# Patient Record
Sex: Female | Born: 1975 | Race: Black or African American | Hispanic: No | Marital: Married | Smoking: Never smoker
Health system: Southern US, Community
[De-identification: ages and names within clinical notes are randomized; demographics above are authoritative.]

## PROBLEM LIST (undated history)

## (undated) DIAGNOSIS — F909 Attention-deficit hyperactivity disorder, unspecified type: Secondary | ICD-10-CM

## (undated) DIAGNOSIS — R569 Unspecified convulsions: Secondary | ICD-10-CM

## (undated) DIAGNOSIS — I10 Essential (primary) hypertension: Secondary | ICD-10-CM

## (undated) DIAGNOSIS — D62 Acute posthemorrhagic anemia: Secondary | ICD-10-CM

## (undated) DIAGNOSIS — Z931 Gastrostomy status: Secondary | ICD-10-CM

## (undated) DIAGNOSIS — R4701 Aphasia: Secondary | ICD-10-CM

## (undated) DIAGNOSIS — F32A Depression, unspecified: Secondary | ICD-10-CM

## (undated) DIAGNOSIS — G8193 Hemiplegia, unspecified affecting right nondominant side: Secondary | ICD-10-CM

## (undated) DIAGNOSIS — I69920 Aphasia following unspecified cerebrovascular disease: Secondary | ICD-10-CM

## (undated) DIAGNOSIS — M2392 Unspecified internal derangement of left knee: Secondary | ICD-10-CM

## (undated) DIAGNOSIS — F419 Anxiety disorder, unspecified: Secondary | ICD-10-CM

## (undated) DIAGNOSIS — F329 Major depressive disorder, single episode, unspecified: Secondary | ICD-10-CM

## (undated) DIAGNOSIS — M1990 Unspecified osteoarthritis, unspecified site: Secondary | ICD-10-CM

## (undated) DIAGNOSIS — I639 Cerebral infarction, unspecified: Secondary | ICD-10-CM

## (undated) HISTORY — PX: KNEE SURGERY: SHX244

---

## 1997-04-16 ENCOUNTER — Ambulatory Visit (HOSPITAL_COMMUNITY): Admission: AD | Admit: 1997-04-16 | Discharge: 1997-04-16 | Payer: Self-pay | Admitting: Obstetrics

## 1997-09-24 ENCOUNTER — Emergency Department (HOSPITAL_COMMUNITY): Admission: EM | Admit: 1997-09-24 | Discharge: 1997-09-25 | Payer: Self-pay | Admitting: Emergency Medicine

## 1997-11-04 ENCOUNTER — Emergency Department (HOSPITAL_COMMUNITY): Admission: EM | Admit: 1997-11-04 | Discharge: 1997-11-04 | Payer: Self-pay | Admitting: Emergency Medicine

## 1998-03-11 ENCOUNTER — Inpatient Hospital Stay (HOSPITAL_COMMUNITY): Admission: AD | Admit: 1998-03-11 | Discharge: 1998-03-11 | Payer: Self-pay | Admitting: Obstetrics & Gynecology

## 1998-03-12 ENCOUNTER — Inpatient Hospital Stay (HOSPITAL_COMMUNITY): Admission: AD | Admit: 1998-03-12 | Discharge: 1998-03-12 | Payer: Self-pay | Admitting: *Deleted

## 1999-07-14 ENCOUNTER — Emergency Department (HOSPITAL_COMMUNITY): Admission: EM | Admit: 1999-07-14 | Discharge: 1999-07-14 | Payer: Self-pay | Admitting: Emergency Medicine

## 1999-12-09 ENCOUNTER — Emergency Department (HOSPITAL_COMMUNITY): Admission: EM | Admit: 1999-12-09 | Discharge: 1999-12-09 | Payer: Self-pay

## 2000-04-09 ENCOUNTER — Inpatient Hospital Stay (HOSPITAL_COMMUNITY): Admission: AD | Admit: 2000-04-09 | Discharge: 2000-04-09 | Payer: Self-pay | Admitting: Obstetrics

## 2000-07-12 ENCOUNTER — Inpatient Hospital Stay (HOSPITAL_COMMUNITY): Admission: EM | Admit: 2000-07-12 | Discharge: 2000-07-14 | Payer: Self-pay | Admitting: Emergency Medicine

## 2000-08-22 ENCOUNTER — Inpatient Hospital Stay (HOSPITAL_COMMUNITY): Admission: AD | Admit: 2000-08-22 | Discharge: 2000-08-22 | Payer: Self-pay | Admitting: *Deleted

## 2000-12-22 ENCOUNTER — Emergency Department (HOSPITAL_COMMUNITY): Admission: EM | Admit: 2000-12-22 | Discharge: 2000-12-22 | Payer: Self-pay | Admitting: Emergency Medicine

## 2000-12-27 ENCOUNTER — Emergency Department (HOSPITAL_COMMUNITY): Admission: EM | Admit: 2000-12-27 | Discharge: 2000-12-28 | Payer: Self-pay | Admitting: Emergency Medicine

## 2001-01-19 ENCOUNTER — Emergency Department (HOSPITAL_COMMUNITY): Admission: EM | Admit: 2001-01-19 | Discharge: 2001-01-19 | Payer: Self-pay | Admitting: Emergency Medicine

## 2001-03-14 ENCOUNTER — Encounter: Payer: Self-pay | Admitting: Emergency Medicine

## 2001-03-14 ENCOUNTER — Emergency Department (HOSPITAL_COMMUNITY): Admission: EM | Admit: 2001-03-14 | Discharge: 2001-03-14 | Payer: Self-pay | Admitting: Emergency Medicine

## 2002-03-07 ENCOUNTER — Emergency Department (HOSPITAL_COMMUNITY): Admission: EM | Admit: 2002-03-07 | Discharge: 2002-03-07 | Payer: Self-pay | Admitting: Emergency Medicine

## 2002-07-28 ENCOUNTER — Emergency Department (HOSPITAL_COMMUNITY): Admission: EM | Admit: 2002-07-28 | Discharge: 2002-07-28 | Payer: Self-pay

## 2002-11-12 ENCOUNTER — Emergency Department (HOSPITAL_COMMUNITY): Admission: EM | Admit: 2002-11-12 | Discharge: 2002-11-12 | Payer: Self-pay | Admitting: Emergency Medicine

## 2002-11-29 ENCOUNTER — Inpatient Hospital Stay (HOSPITAL_COMMUNITY): Admission: AD | Admit: 2002-11-29 | Discharge: 2002-11-29 | Payer: Self-pay | Admitting: Obstetrics and Gynecology

## 2004-01-24 ENCOUNTER — Emergency Department (HOSPITAL_COMMUNITY): Admission: EM | Admit: 2004-01-24 | Discharge: 2004-01-24 | Payer: Self-pay | Admitting: Emergency Medicine

## 2004-12-29 ENCOUNTER — Emergency Department (HOSPITAL_COMMUNITY): Admission: EM | Admit: 2004-12-29 | Discharge: 2004-12-29 | Payer: Self-pay | Admitting: Emergency Medicine

## 2008-05-16 ENCOUNTER — Emergency Department (HOSPITAL_COMMUNITY): Admission: EM | Admit: 2008-05-16 | Discharge: 2008-05-16 | Payer: Self-pay | Admitting: Family Medicine

## 2009-05-22 ENCOUNTER — Emergency Department (HOSPITAL_COMMUNITY): Admission: EM | Admit: 2009-05-22 | Discharge: 2009-05-22 | Payer: Self-pay | Admitting: Emergency Medicine

## 2009-09-12 ENCOUNTER — Inpatient Hospital Stay (HOSPITAL_COMMUNITY): Admission: AD | Admit: 2009-09-12 | Discharge: 2009-09-12 | Payer: Self-pay | Admitting: Obstetrics

## 2009-10-28 ENCOUNTER — Inpatient Hospital Stay (HOSPITAL_COMMUNITY): Admission: AD | Admit: 2009-10-28 | Discharge: 2009-10-28 | Payer: Self-pay | Admitting: Obstetrics and Gynecology

## 2009-10-28 DIAGNOSIS — O2390 Unspecified genitourinary tract infection in pregnancy, unspecified trimester: Secondary | ICD-10-CM

## 2009-10-28 DIAGNOSIS — O98819 Other maternal infectious and parasitic diseases complicating pregnancy, unspecified trimester: Secondary | ICD-10-CM

## 2009-10-28 DIAGNOSIS — A5901 Trichomonal vulvovaginitis: Secondary | ICD-10-CM

## 2009-10-28 DIAGNOSIS — N72 Inflammatory disease of cervix uteri: Secondary | ICD-10-CM

## 2009-11-30 ENCOUNTER — Ambulatory Visit (HOSPITAL_COMMUNITY): Admission: RE | Admit: 2009-11-30 | Discharge: 2009-11-30 | Payer: Self-pay | Admitting: Obstetrics and Gynecology

## 2010-02-05 ENCOUNTER — Inpatient Hospital Stay (HOSPITAL_COMMUNITY)
Admission: AD | Admit: 2010-02-05 | Discharge: 2010-02-11 | Payer: Self-pay | Source: Ambulatory Visit | Admitting: Obstetrics and Gynecology

## 2010-02-06 ENCOUNTER — Encounter: Payer: Self-pay | Admitting: Obstetrics and Gynecology

## 2010-02-07 ENCOUNTER — Encounter (INDEPENDENT_AMBULATORY_CARE_PROVIDER_SITE_OTHER): Payer: Self-pay | Admitting: Obstetrics and Gynecology

## 2010-04-01 ENCOUNTER — Encounter: Payer: Self-pay | Admitting: Unknown Physician Specialty

## 2010-04-01 ENCOUNTER — Encounter: Payer: Self-pay | Admitting: Sports Medicine

## 2010-05-21 LAB — COMPREHENSIVE METABOLIC PANEL
ALT: 20 U/L (ref 0–35)
ALT: 29 U/L (ref 0–35)
AST: 32 U/L (ref 0–37)
AST: 44 U/L — ABNORMAL HIGH (ref 0–37)
Albumin: 2.1 g/dL — ABNORMAL LOW (ref 3.5–5.2)
Alkaline Phosphatase: 100 U/L (ref 39–117)
BUN: 17 mg/dL (ref 6–23)
CO2: 26 mEq/L (ref 19–32)
Calcium: 6.1 mg/dL — CL (ref 8.4–10.5)
Creatinine, Ser: 1.02 mg/dL (ref 0.4–1.2)
GFR calc Af Amer: >60 mL/min (ref >60)
GFR calc non Af Amer: >60 mL/min (ref >60)
GFR calc non Af Amer: >60 mL/min (ref >60)
Glucose, Bld: 116 mg/dL — ABNORMAL HIGH (ref 70–99)
Potassium: 4.1 mEq/L (ref 3.5–5.1)
Sodium: 134 mEq/L — ABNORMAL LOW (ref 135–145)
Total Protein: 4.5 g/dL — ABNORMAL LOW (ref 6.0–8.3)

## 2010-05-21 LAB — CBC
HCT: 31.6 % — ABNORMAL LOW (ref 36.0–46.0)
HCT: 33.4 % — ABNORMAL LOW (ref 36.0–46.0)
Hemoglobin: 10.5 g/dL — ABNORMAL LOW (ref 12.0–15.0)
MCH: 28.8 pg (ref 26.0–34.0)
MCH: 29.3 pg (ref 26.0–34.0)
MCHC: 33.1 g/dL (ref 30.0–36.0)
MCV: 87 fL (ref 78.0–100.0)
RBC: 3.84 MIL/uL — ABNORMAL LOW (ref 3.87–5.11)
RDW: 14.9 % (ref 11.5–15.5)
WBC: 15.3 10*3/uL — ABNORMAL HIGH (ref 4.0–10.5)

## 2010-05-21 LAB — URIC ACID: Uric Acid, Serum: 7.4 mg/dL — ABNORMAL HIGH (ref 2.4–7.0)

## 2010-05-21 LAB — LACTATE DEHYDROGENASE: LDH: 314 U/L — ABNORMAL HIGH (ref 94–250)

## 2010-05-21 LAB — MAGNESIUM: Magnesium: 5.8 mg/dL — ABNORMAL HIGH (ref 1.5–2.5)

## 2010-05-22 LAB — COMPREHENSIVE METABOLIC PANEL
ALT: 15 U/L (ref 0–35)
ALT: 16 U/L (ref 0–35)
ALT: 18 U/L (ref 0–35)
ALT: 19 U/L (ref 0–35)
AST: 24 U/L (ref 0–37)
AST: 27 U/L (ref 0–37)
AST: 28 U/L (ref 0–37)
Albumin: 2.5 g/dL — ABNORMAL LOW (ref 3.5–5.2)
Albumin: 2.6 g/dL — ABNORMAL LOW (ref 3.5–5.2)
Albumin: 2.6 g/dL — ABNORMAL LOW (ref 3.5–5.2)
Alkaline Phosphatase: 118 U/L — ABNORMAL HIGH (ref 39–117)
Alkaline Phosphatase: 125 U/L — ABNORMAL HIGH (ref 39–117)
BUN: 12 mg/dL (ref 6–23)
BUN: 13 mg/dL (ref 6–23)
BUN: 15 mg/dL (ref 6–23)
BUN: 20 mg/dL (ref 6–23)
CO2: 17 mEq/L — ABNORMAL LOW (ref 19–32)
CO2: 20 mEq/L (ref 19–32)
CO2: 20 mEq/L (ref 19–32)
CO2: 21 mEq/L (ref 19–32)
Calcium: 7.3 mg/dL — ABNORMAL LOW (ref 8.4–10.5)
Calcium: 7.8 mg/dL — ABNORMAL LOW (ref 8.4–10.5)
Calcium: 8.8 mg/dL (ref 8.4–10.5)
Chloride: 110 mEq/L (ref 96–112)
Chloride: 113 mEq/L — ABNORMAL HIGH (ref 96–112)
Creatinine, Ser: 1.13 mg/dL (ref 0.4–1.2)
Creatinine, Ser: 1.25 mg/dL — ABNORMAL HIGH (ref 0.4–1.2)
Creatinine, Ser: 1.28 mg/dL — ABNORMAL HIGH (ref 0.4–1.2)
GFR calc Af Amer: 60 mL/min — ABNORMAL LOW (ref >60)
GFR calc Af Amer: >60 mL/min (ref >60)
GFR calc non Af Amer: 48 mL/min — ABNORMAL LOW (ref >60)
GFR calc non Af Amer: 55 mL/min — ABNORMAL LOW (ref >60)
Glucose, Bld: 104 mg/dL — ABNORMAL HIGH (ref 70–99)
Glucose, Bld: 113 mg/dL — ABNORMAL HIGH (ref 70–99)
Glucose, Bld: 121 mg/dL — ABNORMAL HIGH (ref 70–99)
Glucose, Bld: 77 mg/dL (ref 70–99)
Potassium: 3.9 mEq/L (ref 3.5–5.1)
Potassium: 4 mEq/L (ref 3.5–5.1)
Sodium: 132 mEq/L — ABNORMAL LOW (ref 135–145)
Total Bilirubin: 0.6 mg/dL (ref 0.3–1.2)
Total Protein: 5.3 g/dL — ABNORMAL LOW (ref 6.0–8.3)
Total Protein: 5.4 g/dL — ABNORMAL LOW (ref 6.0–8.3)
Total Protein: 5.8 g/dL — ABNORMAL LOW (ref 6.0–8.3)
Total Protein: 6.1 g/dL (ref 6.0–8.3)

## 2010-05-22 LAB — CBC
HCT: 39.8 % (ref 36.0–46.0)
HCT: 40 % (ref 36.0–46.0)
HCT: 41.9 % (ref 36.0–46.0)
Hemoglobin: 13.2 g/dL (ref 12.0–15.0)
Hemoglobin: 13.4 g/dL (ref 12.0–15.0)
MCH: 28.8 pg (ref 26.0–34.0)
MCHC: 33.3 g/dL (ref 30.0–36.0)
MCHC: 33.5 g/dL (ref 30.0–36.0)
MCHC: 33.5 g/dL (ref 30.0–36.0)
MCV: 85.8 fL (ref 78.0–100.0)
MCV: 86.1 fL (ref 78.0–100.0)
Platelets: 121 10*3/uL — ABNORMAL LOW (ref 150–400)
Platelets: 122 10*3/uL — ABNORMAL LOW (ref 150–400)
RBC: 4.85 MIL/uL (ref 3.87–5.11)
RDW: 14.7 % (ref 11.5–15.5)
RDW: 14.8 % (ref 11.5–15.5)
RDW: 14.9 % (ref 11.5–15.5)
WBC: 13.8 10*3/uL — ABNORMAL HIGH (ref 4.0–10.5)

## 2010-05-22 LAB — URIC ACID
Uric Acid, Serum: 8.1 mg/dL — ABNORMAL HIGH (ref 2.4–7.0)
Uric Acid, Serum: 8.3 mg/dL — ABNORMAL HIGH (ref 2.4–7.0)
Uric Acid, Serum: 8.8 mg/dL — ABNORMAL HIGH (ref 2.4–7.0)

## 2010-05-22 LAB — CREATININE CLEARANCE, URINE, 24 HOUR
Collection Interval-CRCL: 24 hours
Creatinine Clearance: 92 mL/min (ref 75–115)
Creatinine, 24H Ur: 1644 mg/d (ref 700–1800)
Creatinine, Urine: 109.6 mg/dL

## 2010-05-22 LAB — PROTEIN, URINE, 24 HOUR: Urine Total Volume-UPROT: 1500 mL

## 2010-05-22 LAB — LACTATE DEHYDROGENASE
LDH: 266 U/L — ABNORMAL HIGH (ref 94–250)
LDH: 291 U/L — ABNORMAL HIGH (ref 94–250)
LDH: 304 U/L — ABNORMAL HIGH (ref 94–250)

## 2010-05-22 LAB — MAGNESIUM: Magnesium: 6.8 mg/dL (ref 1.5–2.5)

## 2010-05-22 LAB — TYPE AND SCREEN: Antibody Screen: NEGATIVE

## 2010-05-24 LAB — URINALYSIS, ROUTINE W REFLEX MICROSCOPIC
Glucose, UA: NEGATIVE mg/dL
Hgb urine dipstick: NEGATIVE
Ketones, ur: 15 mg/dL — AB
pH: 5.5 (ref 5.0–8.0)

## 2010-05-24 LAB — COMPREHENSIVE METABOLIC PANEL
ALT: 14 U/L (ref 0–35)
AST: 17 U/L (ref 0–37)
Albumin: 3.6 g/dL (ref 3.5–5.2)
Calcium: 9.5 mg/dL (ref 8.4–10.5)
GFR calc Af Amer: >60 mL/min (ref >60)
Sodium: 136 mEq/L (ref 135–145)
Total Protein: 7.2 g/dL (ref 6.0–8.3)

## 2010-05-24 LAB — DIFFERENTIAL
Eosinophils Absolute: 0.2 10*3/uL (ref 0.0–0.7)
Eosinophils Relative: 2 % (ref 0–5)
Lymphs Abs: 2.2 10*3/uL (ref 0.7–4.0)
Monocytes Absolute: 0.4 10*3/uL (ref 0.1–1.0)
Monocytes Relative: 5 % (ref 3–12)

## 2010-05-24 LAB — GC/CHLAMYDIA PROBE AMP, GENITAL
Chlamydia, DNA Probe: NEGATIVE
GC Probe Amp, Genital: NEGATIVE

## 2010-05-24 LAB — URINE MICROSCOPIC-ADD ON

## 2010-05-24 LAB — CBC
MCHC: 33.3 g/dL (ref 30.0–36.0)
RDW: 13.9 % (ref 11.5–15.5)
WBC: 8.6 10*3/uL (ref 4.0–10.5)

## 2010-05-24 LAB — WET PREP, GENITAL: Clue Cells Wet Prep HPF POC: NONE SEEN

## 2010-05-24 LAB — HERPES SIMPLEX VIRUS CULTURE

## 2010-05-27 LAB — URINALYSIS, ROUTINE W REFLEX MICROSCOPIC
Glucose, UA: NEGATIVE mg/dL
Hgb urine dipstick: NEGATIVE
Ketones, ur: 15 mg/dL — AB
Protein, ur: NEGATIVE mg/dL

## 2010-05-27 LAB — COMPREHENSIVE METABOLIC PANEL
ALT: 10 U/L (ref 0–35)
AST: 12 U/L (ref 0–37)
Albumin: 3.5 g/dL (ref 3.5–5.2)
Alkaline Phosphatase: 54 U/L (ref 39–117)
BUN: 5 mg/dL — ABNORMAL LOW (ref 6–23)
Chloride: 106 mEq/L (ref 96–112)
Potassium: 3.4 mEq/L — ABNORMAL LOW (ref 3.5–5.1)
Sodium: 136 mEq/L (ref 135–145)
Total Bilirubin: 0.9 mg/dL (ref 0.3–1.2)
Total Protein: 6.9 g/dL (ref 6.0–8.3)

## 2010-05-27 LAB — CBC
MCV: 86 fL (ref 78.0–100.0)
Platelets: 237 10*3/uL (ref 150–400)
RBC: 4.54 MIL/uL (ref 3.87–5.11)
RDW: 12.5 % (ref 11.5–15.5)
WBC: 7.3 10*3/uL (ref 4.0–10.5)

## 2010-05-27 LAB — URINE MICROSCOPIC-ADD ON

## 2010-05-27 LAB — POCT PREGNANCY, URINE: Preg Test, Ur: POSITIVE

## 2010-06-01 LAB — URINALYSIS, ROUTINE W REFLEX MICROSCOPIC
Ketones, ur: 40 mg/dL — AB
Nitrite: NEGATIVE
Protein, ur: 30 mg/dL — AB
Urobilinogen, UA: 1 mg/dL (ref 0.0–1.0)
pH: 6 (ref 5.0–8.0)

## 2010-06-01 LAB — URINE CULTURE: Colony Count: >=100000

## 2010-06-01 LAB — GC/CHLAMYDIA PROBE AMP, GENITAL: GC Probe Amp, Genital: NEGATIVE

## 2010-06-01 LAB — POCT PREGNANCY, URINE: Preg Test, Ur: NEGATIVE

## 2010-06-01 LAB — WET PREP, GENITAL
Clue Cells Wet Prep HPF POC: NONE SEEN
Trich, Wet Prep: NONE SEEN
WBC, Wet Prep HPF POC: NONE SEEN

## 2010-06-01 LAB — URINE MICROSCOPIC-ADD ON

## 2010-06-01 LAB — RPR: RPR Ser Ql: NONREACTIVE

## 2010-07-27 NOTE — Discharge Summary (Signed)
[place]. [facility]  Patient:    Tammie Miles, Tammie Miles                        MRN: [id number] Adm. Date:  [id number] Disc. Date: [id number] Attending:  Arsenio Loader Dictator:   Pricilla Holm, M.D.                           Discharge Summary  SERVICE:  Med-B teaching service.  DISCHARGE DIAGNOSES: 1. Overdose on acetaminophen. 2. Alcohol abuse. 3. Cocaine abuse. 4. Depression.  DISCHARGE MEDICATION:  Antidepressant to be started by [facility] team.  SUMMARY:  This patient is a 35 year old black female who presented to the [facility] Emergency Department  on Jul 12, 2000, after taking a bottle of Tylenol Cold and Flu.  Apparently the patient had had a two to three day history of flulike symptoms, went to see her friend approximately 4:30 in the morning the day of admission asking for help.  She reports to her friend that she had a fight with her boyfriend, with whom she lives with, and took a whole bottle of Tylenol Cold and Flu.  The patient also admits to excessive alcohol use and cocaine use along with the overdose of the Tylenol.  On admission, the patient had a Glasgow Coma Score of 12.  She was agitated and speaking incoherently and tachycardia.  Otherwise she was in no acute distress.  Her initial EKG showed normal sinus rhythm, tachycardic at 133.  Her urine was negative.  Her initial alcohol level was 29, initial acetaminophen level was 160, urine drug screen was positive for cocaine, salicylate level was 9.8. The rest of her labs were within normal limits.  Head CT negative.  Chest x-ray negative. The patient was given Mucomyst in the emergency room and had significant vomiting, therefore she was not lavaged as she had no gastric contents.  The patient was admitted to the step-down unit for further monitoring and care.  HOSPITAL COURSE:  #1 - ACETAMINOPHEN OVERDOSE IN TOXIC LEVELS:  The patient was continued on Mucomyst q.4h. per the  protocol.  An NG tube did not have to be placed as she was able to drink.  Her acetaminophen level went from 160 to 154 to 31.3 to 3.5 and was found to be 1.7 on the day of discharge. She regained her alertness and orientation several hours from starting the Mucomyst.  She was able to recount the story of becoming frustrated and fighting with her boyfriend, hence the overdose. She told a friend that she did not care if she lived or died.  Her liver enzymes remained within normal range and the patient did not suffer hepatic toxicity from her overdose.  #2 -  DEPRESSION:  Behavioral Medicine came by to see the patient.  The patient has a history of depression and anxiety with multiple neuroses with symptoms including anhedonia, difficulty concentrating, feeling hopelessness and worthlessness but denied any suicidal ideation.  The patient plans to avoid her boyfriend and stay with her friend for the next several weeks. The patient will be following up with Dr. Katrinka Blazing at [facility] at 2 p.m. on day of discharge.  The patient will also be started on an antidepressant via Dr. Katrinka Blazing at [facility].  She will retain close contacts with Dr. Katrinka Blazing.  DISCHARGE CONDITION:  Good.  DISPOSITION:  Discharge to a friends home.  MEDICATIONS:  As per  above.  INSTRUCTIONS:  The patient was given no specific instructions in terms of her diet or activity.  She was given specific signs and symptoms to warrant further treatment such as increasing depression, suicidal ideation or intent.  FOLLOW-UP:  The patient will follow up with Dr. Katrinka Blazing at [facility] at 2 p.m. today the day of discharge.  The patient will follow up with Pricilla Holm, M.D., at [facility] in two to three weeks for general medical care.  The patient voiced good understanding of the above plan and had no further questions. DD:  07/14/00 TD:  07/15/00 Job: [id number]

## 2010-12-28 ENCOUNTER — Ambulatory Visit (HOSPITAL_COMMUNITY)
Admission: RE | Admit: 2010-12-28 | Discharge: 2010-12-28 | Disposition: A | Discharge status: Home / Self Care | Payer: Medicaid Other | Source: Ambulatory Visit | Attending: Psychiatry | Admitting: Psychiatry

## 2010-12-28 DIAGNOSIS — F4310 Post-traumatic stress disorder, unspecified: Secondary | ICD-10-CM | POA: Insufficient documentation

## 2017-11-27 ENCOUNTER — Emergency Department (HOSPITAL_COMMUNITY): Payer: Medicaid Other

## 2017-11-27 ENCOUNTER — Emergency Department (HOSPITAL_COMMUNITY): Payer: Medicaid Other | Admitting: Anesthesiology

## 2017-11-27 ENCOUNTER — Inpatient Hospital Stay (HOSPITAL_COMMUNITY): Payer: Medicaid Other

## 2017-11-27 ENCOUNTER — Encounter (HOSPITAL_COMMUNITY): Payer: Self-pay | Admitting: Neurology

## 2017-11-27 ENCOUNTER — Inpatient Hospital Stay (HOSPITAL_COMMUNITY)
Admission: EM | Disposition: A | Discharge status: Rehabilitation Facility | Payer: Self-pay | Source: Home / Self Care | Attending: Neurology

## 2017-11-27 ENCOUNTER — Inpatient Hospital Stay (HOSPITAL_COMMUNITY)
Admission: EM | Admit: 2017-11-27 | Discharge: 2018-01-08 | DRG: 003 | Disposition: A | Discharge status: Rehabilitation Facility | Payer: Medicaid Other | Attending: Neurology | Admitting: Neurology

## 2017-11-27 ENCOUNTER — Inpatient Hospital Stay: Payer: Self-pay

## 2017-11-27 DIAGNOSIS — I119 Hypertensive heart disease without heart failure: Secondary | ICD-10-CM | POA: Diagnosis present

## 2017-11-27 DIAGNOSIS — J9500 Unspecified tracheostomy complication: Secondary | ICD-10-CM

## 2017-11-27 DIAGNOSIS — E870 Hyperosmolality and hypernatremia: Secondary | ICD-10-CM | POA: Diagnosis not present

## 2017-11-27 DIAGNOSIS — R402312 Coma scale, best motor response, none, at arrival to emergency department: Secondary | ICD-10-CM | POA: Diagnosis present

## 2017-11-27 DIAGNOSIS — Z8249 Family history of ischemic heart disease and other diseases of the circulatory system: Secondary | ICD-10-CM

## 2017-11-27 DIAGNOSIS — R0989 Other specified symptoms and signs involving the circulatory and respiratory systems: Secondary | ICD-10-CM

## 2017-11-27 DIAGNOSIS — R05 Cough: Secondary | ICD-10-CM

## 2017-11-27 DIAGNOSIS — R4182 Altered mental status, unspecified: Secondary | ICD-10-CM | POA: Diagnosis present

## 2017-11-27 DIAGNOSIS — I161 Hypertensive emergency: Secondary | ICD-10-CM

## 2017-11-27 DIAGNOSIS — R402112 Coma scale, eyes open, never, at arrival to emergency department: Secondary | ICD-10-CM | POA: Diagnosis present

## 2017-11-27 DIAGNOSIS — E46 Unspecified protein-calorie malnutrition: Secondary | ICD-10-CM | POA: Diagnosis not present

## 2017-11-27 DIAGNOSIS — F909 Attention-deficit hyperactivity disorder, unspecified type: Secondary | ICD-10-CM

## 2017-11-27 DIAGNOSIS — R29728 NIHSS score 28: Secondary | ICD-10-CM | POA: Diagnosis present

## 2017-11-27 DIAGNOSIS — Z6828 Body mass index (BMI) 28.0-28.9, adult: Secondary | ICD-10-CM

## 2017-11-27 DIAGNOSIS — Z95828 Presence of other vascular implants and grafts: Secondary | ICD-10-CM

## 2017-11-27 DIAGNOSIS — I629 Nontraumatic intracranial hemorrhage, unspecified: Secondary | ICD-10-CM

## 2017-11-27 DIAGNOSIS — F329 Major depressive disorder, single episode, unspecified: Secondary | ICD-10-CM | POA: Diagnosis present

## 2017-11-27 DIAGNOSIS — J323 Chronic sphenoidal sinusitis: Secondary | ICD-10-CM | POA: Diagnosis present

## 2017-11-27 DIAGNOSIS — R0689 Other abnormalities of breathing: Secondary | ICD-10-CM

## 2017-11-27 DIAGNOSIS — F32A Depression, unspecified: Secondary | ICD-10-CM

## 2017-11-27 DIAGNOSIS — I619 Nontraumatic intracerebral hemorrhage, unspecified: Secondary | ICD-10-CM | POA: Diagnosis present

## 2017-11-27 DIAGNOSIS — E781 Pure hyperglyceridemia: Secondary | ICD-10-CM | POA: Diagnosis present

## 2017-11-27 DIAGNOSIS — Z789 Other specified health status: Secondary | ICD-10-CM

## 2017-11-27 DIAGNOSIS — R29810 Facial weakness: Secondary | ICD-10-CM | POA: Diagnosis present

## 2017-11-27 DIAGNOSIS — R4701 Aphasia: Secondary | ICD-10-CM | POA: Diagnosis present

## 2017-11-27 DIAGNOSIS — I63412 Cerebral infarction due to embolism of left middle cerebral artery: Secondary | ICD-10-CM | POA: Diagnosis present

## 2017-11-27 DIAGNOSIS — J9811 Atelectasis: Secondary | ICD-10-CM | POA: Diagnosis not present

## 2017-11-27 DIAGNOSIS — F419 Anxiety disorder, unspecified: Secondary | ICD-10-CM | POA: Diagnosis present

## 2017-11-27 DIAGNOSIS — E876 Hypokalemia: Secondary | ICD-10-CM | POA: Diagnosis present

## 2017-11-27 DIAGNOSIS — D649 Anemia, unspecified: Secondary | ICD-10-CM

## 2017-11-27 DIAGNOSIS — R414 Neurologic neglect syndrome: Secondary | ICD-10-CM | POA: Diagnosis present

## 2017-11-27 DIAGNOSIS — Z9889 Other specified postprocedural states: Secondary | ICD-10-CM

## 2017-11-27 DIAGNOSIS — D72829 Elevated white blood cell count, unspecified: Secondary | ICD-10-CM

## 2017-11-27 DIAGNOSIS — I6389 Other cerebral infarction: Secondary | ICD-10-CM

## 2017-11-27 DIAGNOSIS — G935 Compression of brain: Secondary | ICD-10-CM | POA: Diagnosis present

## 2017-11-27 DIAGNOSIS — G8191 Hemiplegia, unspecified affecting right dominant side: Secondary | ICD-10-CM | POA: Diagnosis present

## 2017-11-27 DIAGNOSIS — Z978 Presence of other specified devices: Secondary | ICD-10-CM

## 2017-11-27 DIAGNOSIS — R402212 Coma scale, best verbal response, none, at arrival to emergency department: Secondary | ICD-10-CM | POA: Diagnosis present

## 2017-11-27 DIAGNOSIS — R569 Unspecified convulsions: Secondary | ICD-10-CM

## 2017-11-27 DIAGNOSIS — J9690 Respiratory failure, unspecified, unspecified whether with hypoxia or hypercapnia: Secondary | ICD-10-CM | POA: Diagnosis present

## 2017-11-27 DIAGNOSIS — G9340 Encephalopathy, unspecified: Secondary | ICD-10-CM

## 2017-11-27 DIAGNOSIS — J9601 Acute respiratory failure with hypoxia: Secondary | ICD-10-CM

## 2017-11-27 DIAGNOSIS — J9621 Acute and chronic respiratory failure with hypoxia: Secondary | ICD-10-CM | POA: Diagnosis present

## 2017-11-27 DIAGNOSIS — I611 Nontraumatic intracerebral hemorrhage in hemisphere, cortical: Secondary | ICD-10-CM | POA: Diagnosis present

## 2017-11-27 DIAGNOSIS — L8990 Pressure ulcer of unspecified site, unspecified stage: Secondary | ICD-10-CM

## 2017-11-27 DIAGNOSIS — F411 Generalized anxiety disorder: Secondary | ICD-10-CM

## 2017-11-27 DIAGNOSIS — Z4659 Encounter for fitting and adjustment of other gastrointestinal appliance and device: Secondary | ICD-10-CM

## 2017-11-27 DIAGNOSIS — G936 Cerebral edema: Secondary | ICD-10-CM | POA: Diagnosis present

## 2017-11-27 DIAGNOSIS — E878 Other disorders of electrolyte and fluid balance, not elsewhere classified: Secondary | ICD-10-CM | POA: Diagnosis not present

## 2017-11-27 DIAGNOSIS — Z23 Encounter for immunization: Secondary | ICD-10-CM | POA: Diagnosis not present

## 2017-11-27 DIAGNOSIS — E872 Acidosis: Secondary | ICD-10-CM | POA: Diagnosis not present

## 2017-11-27 DIAGNOSIS — D6489 Other specified anemias: Secondary | ICD-10-CM | POA: Diagnosis present

## 2017-11-27 DIAGNOSIS — R1313 Dysphagia, pharyngeal phase: Secondary | ICD-10-CM | POA: Diagnosis present

## 2017-11-27 DIAGNOSIS — Z9289 Personal history of other medical treatment: Secondary | ICD-10-CM

## 2017-11-27 DIAGNOSIS — Z9181 History of falling: Secondary | ICD-10-CM

## 2017-11-27 DIAGNOSIS — I10 Essential (primary) hypertension: Secondary | ICD-10-CM

## 2017-11-27 DIAGNOSIS — R059 Cough, unspecified: Secondary | ICD-10-CM

## 2017-11-27 HISTORY — DX: Attention-deficit hyperactivity disorder, unspecified type: F90.9

## 2017-11-27 HISTORY — DX: Essential (primary) hypertension: I10

## 2017-11-27 HISTORY — DX: Anxiety disorder, unspecified: F41.9

## 2017-11-27 HISTORY — PX: CRANIOTOMY: SHX93

## 2017-11-27 HISTORY — DX: Depression, unspecified: F32.A

## 2017-11-27 HISTORY — DX: Major depressive disorder, single episode, unspecified: F32.9

## 2017-11-27 LAB — POCT I-STAT 3, ART BLOOD GAS (G3+)
ACID-BASE DEFICIT: 1 mmol/L (ref 0.0–2.0)
BICARBONATE: 23.7 mmol/L (ref 20.0–28.0)
O2 Saturation: 100 %
PH ART: 7.416 (ref 7.350–7.450)
PO2 ART: 379 mmHg — AB (ref 83.0–108.0)
TCO2: 25 mmol/L (ref 22–32)
pCO2 arterial: 36.9 mmHg (ref 32.0–48.0)

## 2017-11-27 LAB — URINALYSIS, COMPLETE (UACMP) WITH MICROSCOPIC
BACTERIA UA: NONE SEEN
Bilirubin Urine: NEGATIVE
GLUCOSE, UA: NEGATIVE mg/dL
Hgb urine dipstick: NEGATIVE
Ketones, ur: NEGATIVE mg/dL
Leukocytes, UA: NEGATIVE
NITRITE: NEGATIVE
Protein, ur: NEGATIVE mg/dL
SPECIFIC GRAVITY, URINE: 1.035 — AB (ref 1.005–1.030)
pH: 5 (ref 5.0–8.0)

## 2017-11-27 LAB — HEMOGLOBIN AND HEMATOCRIT, BLOOD
HEMATOCRIT: 30.6 % — AB (ref 36.0–46.0)
Hemoglobin: 9.3 g/dL — ABNORMAL LOW (ref 12.0–15.0)

## 2017-11-27 LAB — POCT I-STAT 7, (LYTES, BLD GAS, ICA,H+H)
Acid-base deficit: 2 mmol/L (ref 0.0–2.0)
Bicarbonate: 22 mmol/L (ref 20.0–28.0)
Calcium, Ion: 1.04 mmol/L — ABNORMAL LOW (ref 1.15–1.40)
HCT: 32 % — ABNORMAL LOW (ref 36.0–46.0)
HEMOGLOBIN: 10.9 g/dL — AB (ref 12.0–15.0)
O2 Saturation: 99 %
PCO2 ART: 33.8 mmHg (ref 32.0–48.0)
PO2 ART: 114 mmHg — AB (ref 83.0–108.0)
Potassium: 2.9 mmol/L — ABNORMAL LOW (ref 3.5–5.1)
Sodium: 138 mmol/L (ref 135–145)
TCO2: 23 mmol/L (ref 22–32)
pH, Arterial: 7.417 (ref 7.350–7.450)

## 2017-11-27 LAB — MAGNESIUM: Magnesium: 1.9 mg/dL (ref 1.7–2.4)

## 2017-11-27 LAB — RAPID URINE DRUG SCREEN, HOSP PERFORMED
Amphetamines: POSITIVE — AB
Barbiturates: NOT DETECTED
Benzodiazepines: NOT DETECTED
Cocaine: NOT DETECTED
Opiates: NOT DETECTED
Tetrahydrocannabinol: NOT DETECTED

## 2017-11-27 LAB — COMPREHENSIVE METABOLIC PANEL
ALT: 15 U/L (ref 0–44)
AST: 24 U/L (ref 15–41)
Albumin: 3.8 g/dL (ref 3.5–5.0)
Alkaline Phosphatase: 83 U/L (ref 38–126)
Anion gap: 17 — ABNORMAL HIGH (ref 5–15)
BILIRUBIN TOTAL: 1.2 mg/dL (ref 0.3–1.2)
BUN: 11 mg/dL (ref 6–20)
CO2: 25 mmol/L (ref 22–32)
Calcium: 9.2 mg/dL (ref 8.9–10.3)
Chloride: 98 mmol/L (ref 98–111)
Creatinine, Ser: 1.04 mg/dL — ABNORMAL HIGH (ref 0.44–1.00)
Glucose, Bld: 144 mg/dL — ABNORMAL HIGH (ref 70–99)
Potassium: 3 mmol/L — ABNORMAL LOW (ref 3.5–5.1)
Sodium: 140 mmol/L (ref 135–145)
TOTAL PROTEIN: 7.6 g/dL (ref 6.5–8.1)

## 2017-11-27 LAB — CBC
HEMATOCRIT: 38.7 % (ref 36.0–46.0)
Hemoglobin: 11.8 g/dL — ABNORMAL LOW (ref 12.0–15.0)
MCH: 23.7 pg — AB (ref 26.0–34.0)
MCHC: 30.5 g/dL (ref 30.0–36.0)
MCV: 77.9 fL — AB (ref 78.0–100.0)
Platelets: 335 10*3/uL (ref 150–400)
RBC: 4.97 MIL/uL (ref 3.87–5.11)
RDW: 18.1 % — AB (ref 11.5–15.5)
WBC: 16.4 10*3/uL — ABNORMAL HIGH (ref 4.0–10.5)

## 2017-11-27 LAB — TRIGLYCERIDES: Triglycerides: 211 mg/dL — ABNORMAL HIGH (ref <150)

## 2017-11-27 LAB — I-STAT BETA HCG BLOOD, ED (MC, WL, AP ONLY): I-stat hCG, quantitative: <5 m[IU]/mL

## 2017-11-27 LAB — I-STAT CHEM 8, ED
BUN: 12 mg/dL (ref 6–20)
CHLORIDE: 100 mmol/L (ref 98–111)
CREATININE: 0.8 mg/dL (ref 0.44–1.00)
Calcium, Ion: 0.98 mmol/L — ABNORMAL LOW (ref 1.15–1.40)
GLUCOSE: 141 mg/dL — AB (ref 70–99)
HEMATOCRIT: 40 % (ref 36.0–46.0)
HEMOGLOBIN: 13.6 g/dL (ref 12.0–15.0)
Potassium: 2.9 mmol/L — ABNORMAL LOW (ref 3.5–5.1)
Sodium: 138 mmol/L (ref 135–145)
TCO2: 25 mmol/L (ref 22–32)

## 2017-11-27 LAB — CBG MONITORING, ED: Glucose-Capillary: 120 mg/dL — ABNORMAL HIGH (ref 70–99)

## 2017-11-27 LAB — LACTIC ACID, PLASMA: Lactic Acid, Venous: 4.3 mmol/L (ref 0.5–1.9)

## 2017-11-27 LAB — TYPE AND SCREEN
ABO/RH(D): O POS
Antibody Screen: NEGATIVE

## 2017-11-27 LAB — ABO/RH: ABO/RH(D): O POS

## 2017-11-27 LAB — PROCALCITONIN: Procalcitonin: <0.1 ng/mL

## 2017-11-27 LAB — I-STAT CG4 LACTIC ACID, ED: Lactic Acid, Venous: 4.07 mmol/L (ref 0.5–1.9)

## 2017-11-27 LAB — MRSA PCR SCREENING: MRSA by PCR: NEGATIVE

## 2017-11-27 LAB — GLUCOSE, CAPILLARY
Glucose-Capillary: 82 mg/dL (ref 70–99)
Glucose-Capillary: 93 mg/dL (ref 70–99)

## 2017-11-27 LAB — PROTIME-INR
INR: 1.06
PROTHROMBIN TIME: 13.7 s (ref 11.4–15.2)

## 2017-11-27 LAB — ETHANOL: Alcohol, Ethyl (B): <10 mg/dL

## 2017-11-27 SURGERY — CRANIOTOMY INTRACRANIAL ARTERIO-VENOUS MALFORMATION DURAL COMPLEX (AVM)
Anesthesia: General | Laterality: Left

## 2017-11-27 MED ORDER — FENTANYL 2500MCG IN NS 250ML (10MCG/ML) PREMIX INFUSION
0.0000 ug/h | INTRAVENOUS | Status: DC
  Administered 2017-11-27: 100 ug/h via INTRAVENOUS
  Administered 2017-11-27: 75 ug/h via INTRAVENOUS
  Administered 2017-11-28 – 2017-11-29 (×2): 100 ug/h via INTRAVENOUS
  Administered 2017-11-30: 75 ug/h via INTRAVENOUS
  Administered 2017-12-01: 200 ug/h via INTRAVENOUS
  Administered 2017-12-02: 150 ug/h via INTRAVENOUS
  Administered 2017-12-03: 75 ug/h via INTRAVENOUS
  Administered 2017-12-05: 50 ug/h via INTRAVENOUS
  Administered 2017-12-07: 25 ug/h via INTRAVENOUS
  Filled 2017-11-27 (×9): qty 250

## 2017-11-27 MED ORDER — LIDOCAINE-EPINEPHRINE 1 %-1:100000 IJ SOLN
INTRAMUSCULAR | Status: AC
  Filled 2017-11-27: qty 1

## 2017-11-27 MED ORDER — PROPOFOL 1000 MG/100ML IV EMUL
INTRAVENOUS | Status: AC
  Filled 2017-11-27: qty 100

## 2017-11-27 MED ORDER — BISACODYL 10 MG RE SUPP
10.0000 mg | Freq: Every day | RECTAL | Status: DC | PRN
  Administered 2017-12-06: 10 mg via RECTAL
  Filled 2017-11-27: qty 1

## 2017-11-27 MED ORDER — LIDOCAINE HCL (PF) 1 % IJ SOLN
INTRAMUSCULAR | Status: DC | PRN
  Administered 2017-11-27: 5 mL via INTRADERMAL

## 2017-11-27 MED ORDER — BUPIVACAINE HCL (PF) 0.5 % IJ SOLN
INTRAMUSCULAR | Status: AC
  Filled 2017-11-27: qty 30

## 2017-11-27 MED ORDER — SODIUM CHLORIDE 0.9 % IV SOLN
INTRAVENOUS | Status: DC
  Administered 2017-11-27 – 2017-11-29 (×7): via INTRAVENOUS

## 2017-11-27 MED ORDER — PROPOFOL 10 MG/ML IV BOLUS
INTRAVENOUS | Status: AC
  Filled 2017-11-27: qty 20

## 2017-11-27 MED ORDER — MICROFIBRILLAR COLL HEMOSTAT EX PADS
MEDICATED_PAD | CUTANEOUS | Status: DC | PRN
  Administered 2017-11-27: 1 via TOPICAL

## 2017-11-27 MED ORDER — POTASSIUM CHLORIDE 10 MEQ/100ML IV SOLN
10.0000 meq | INTRAVENOUS | Status: AC
  Administered 2017-11-27 (×4): 10 meq via INTRAVENOUS
  Filled 2017-11-27: qty 100

## 2017-11-27 MED ORDER — ONDANSETRON HCL 4 MG/2ML IJ SOLN: 4.0000 mg | INTRAMUSCULAR | Status: DC | PRN

## 2017-11-27 MED ORDER — THROMBIN 20000 UNITS EX SOLR
CUTANEOUS | Status: DC | PRN
  Administered 2017-11-27: 12:00:00 via TOPICAL

## 2017-11-27 MED ORDER — SODIUM CHLORIDE 0.9 % IV SOLN
INTRAVENOUS | Status: DC | PRN
  Administered 2017-11-27: 50 ug/min via INTRAVENOUS

## 2017-11-27 MED ORDER — CEFAZOLIN SODIUM 1 G IJ SOLR
INTRAMUSCULAR | Status: AC
  Filled 2017-11-27: qty 20

## 2017-11-27 MED ORDER — ORAL CARE MOUTH RINSE
15.0000 mL | OROMUCOSAL | Status: DC
  Administered 2017-11-27 – 2018-01-08 (×377): 15 mL via OROMUCOSAL

## 2017-11-27 MED ORDER — IOPAMIDOL (ISOVUE-370) INJECTION 76%
INTRAVENOUS | Status: AC
  Administered 2017-11-27: 50 mL
  Filled 2017-11-27: qty 50

## 2017-11-27 MED ORDER — SODIUM CHLORIDE 0.9 % IV BOLUS
500.0000 mL | Freq: Once | INTRAVENOUS | Status: AC
  Administered 2017-11-27: 500 mL via INTRAVENOUS

## 2017-11-27 MED ORDER — POTASSIUM CHLORIDE 20 MEQ/15ML (10%) PO SOLN
40.0000 meq | Freq: Three times a day (TID) | ORAL | Status: AC
  Administered 2017-11-27 (×2): 40 meq
  Filled 2017-11-27 (×2): qty 30

## 2017-11-27 MED ORDER — BUPIVACAINE HCL 0.5 % IJ SOLN
INTRAMUSCULAR | Status: DC | PRN
  Administered 2017-11-27: 5 mL

## 2017-11-27 MED ORDER — 0.9 % SODIUM CHLORIDE (POUR BTL) OPTIME
TOPICAL | Status: DC | PRN
  Administered 2017-11-27 (×3): 1000 mL

## 2017-11-27 MED ORDER — SODIUM CHLORIDE 0.9 % IV BOLUS
1000.0000 mL | Freq: Once | INTRAVENOUS | Status: AC
  Administered 2017-11-27: 1000 mL via INTRAVENOUS

## 2017-11-27 MED ORDER — ACETAMINOPHEN 650 MG RE SUPP: 650.0000 mg | RECTAL | Status: DC | PRN

## 2017-11-27 MED ORDER — ACETAMINOPHEN 325 MG PO TABS: 650.0000 mg | ORAL_TABLET | ORAL | Status: DC | PRN

## 2017-11-27 MED ORDER — CHLORHEXIDINE GLUCONATE 0.12% ORAL RINSE (MEDLINE KIT)
15.0000 mL | Freq: Two times a day (BID) | OROMUCOSAL | Status: DC
  Administered 2017-11-27 – 2018-01-08 (×81): 15 mL via OROMUCOSAL
  Filled 2017-11-27 (×2): qty 15

## 2017-11-27 MED ORDER — NICARDIPINE HCL IN NACL 20-0.86 MG/200ML-% IV SOLN: 3.0000 mg/h | Freq: Once | INTRAVENOUS | Status: DC

## 2017-11-27 MED ORDER — BACITRACIN ZINC 500 UNIT/GM EX OINT
TOPICAL_OINTMENT | CUTANEOUS | Status: AC
  Filled 2017-11-27: qty 28.35

## 2017-11-27 MED ORDER — CLEVIDIPINE BUTYRATE 0.5 MG/ML IV EMUL
0.0000 mg/h | INTRAVENOUS | Status: DC
  Administered 2017-11-27: 1 mg/h via INTRAVENOUS
  Administered 2017-11-27: 6 mg/h via INTRAVENOUS
  Administered 2017-11-27: 9 mg/h via INTRAVENOUS
  Administered 2017-11-28: 5 mg/h via INTRAVENOUS
  Administered 2017-11-28: 6 mg/h via INTRAVENOUS
  Administered 2017-11-28: 7 mg/h via INTRAVENOUS
  Administered 2017-11-28: 4 mg/h via INTRAVENOUS
  Administered 2017-11-28: 7 mg/h via INTRAVENOUS
  Administered 2017-11-28: 14 mg/h via INTRAVENOUS
  Administered 2017-11-29: 12 mg/h via INTRAVENOUS
  Administered 2017-11-29: 7 mg/h via INTRAVENOUS
  Administered 2017-11-29: 12 mg/h via INTRAVENOUS
  Administered 2017-11-29: 11 mg/h via INTRAVENOUS
  Administered 2017-11-29: 5 mg/h via INTRAVENOUS
  Administered 2017-11-29: 14 mg/h via INTRAVENOUS
  Administered 2017-11-29: 12 mg/h via INTRAVENOUS
  Administered 2017-11-29: 15 mg/h via INTRAVENOUS
  Administered 2017-11-30 (×2): 20 mg/h via INTRAVENOUS
  Administered 2017-11-30 (×2): 14 mg/h via INTRAVENOUS
  Administered 2017-11-30: 19 mg/h via INTRAVENOUS
  Administered 2017-11-30: 20 mg/h via INTRAVENOUS
  Administered 2017-11-30: 15 mg/h via INTRAVENOUS
  Administered 2017-11-30 (×2): 13 mg/h via INTRAVENOUS
  Administered 2017-11-30: 19 mg/h via INTRAVENOUS
  Administered 2017-12-01: 20 mg/h via INTRAVENOUS
  Administered 2017-12-01 (×2): 19 mg/h via INTRAVENOUS
  Filled 2017-11-27 (×4): qty 50
  Filled 2017-11-27: qty 100
  Filled 2017-11-27 (×2): qty 50
  Filled 2017-11-27: qty 100
  Filled 2017-11-27 (×15): qty 50

## 2017-11-27 MED ORDER — ROCURONIUM BROMIDE 10 MG/ML (PF) SYRINGE
PREFILLED_SYRINGE | INTRAVENOUS | Status: DC | PRN
  Administered 2017-11-27: 20 mg via INTRAVENOUS

## 2017-11-27 MED ORDER — DOCUSATE SODIUM 50 MG/5ML PO LIQD: 100.0000 mg | Freq: Two times a day (BID) | ORAL | Status: DC | PRN

## 2017-11-27 MED ORDER — HYDROCODONE-ACETAMINOPHEN 5-325 MG PO TABS: 1.0000 | ORAL_TABLET | ORAL | Status: DC | PRN

## 2017-11-27 MED ORDER — ROCURONIUM BROMIDE 50 MG/5ML IV SOSY
PREFILLED_SYRINGE | INTRAVENOUS | Status: AC
  Filled 2017-11-27: qty 5

## 2017-11-27 MED ORDER — PROPOFOL 1000 MG/100ML IV EMUL
5.0000 ug/kg/min | INTRAVENOUS | Status: DC
  Administered 2017-11-27: 35 ug/kg/min via INTRAVENOUS
  Administered 2017-11-27: 60 ug/kg/min via INTRAVENOUS
  Administered 2017-11-27: 40 ug/kg/min via INTRAVENOUS
  Administered 2017-11-28: 45 ug/kg/min via INTRAVENOUS
  Administered 2017-11-28: 50 ug/kg/min via INTRAVENOUS
  Administered 2017-11-28: 40 ug/kg/min via INTRAVENOUS
  Administered 2017-11-28: 30 ug/kg/min via INTRAVENOUS
  Administered 2017-11-29: 10 ug/kg/min via INTRAVENOUS
  Administered 2017-11-29: 25 ug/kg/min via INTRAVENOUS
  Filled 2017-11-27 (×8): qty 100

## 2017-11-27 MED ORDER — CEFAZOLIN SODIUM-DEXTROSE 2-4 GM/100ML-% IV SOLN: 2.0000 g | INTRAVENOUS | Status: DC

## 2017-11-27 MED ORDER — PROPOFOL 1000 MG/100ML IV EMUL
5.0000 ug/kg/min | Freq: Once | INTRAVENOUS | Status: AC
  Administered 2017-11-27: 50 ug/kg/min via INTRAVENOUS

## 2017-11-27 MED ORDER — LABETALOL HCL 5 MG/ML IV SOLN
INTRAVENOUS | Status: AC
  Filled 2017-11-27: qty 4

## 2017-11-27 MED ORDER — LEVETIRACETAM IN NACL 500 MG/100ML IV SOLN
500.0000 mg | Freq: Two times a day (BID) | INTRAVENOUS | Status: DC
  Administered 2017-11-27 – 2017-12-03 (×12): 500 mg via INTRAVENOUS
  Filled 2017-11-27 (×12): qty 100

## 2017-11-27 MED ORDER — CLEVIDIPINE BUTYRATE 0.5 MG/ML IV EMUL
INTRAVENOUS | Status: AC
  Filled 2017-11-27: qty 50

## 2017-11-27 MED ORDER — ONDANSETRON HCL 4 MG PO TABS: 4.0000 mg | ORAL_TABLET | ORAL | Status: DC | PRN

## 2017-11-27 MED ORDER — MANNITOL 20 % IV SOLN
50.0000 g | Freq: Once | Status: AC
  Administered 2017-11-27: 50 g via INTRAVENOUS

## 2017-11-27 MED ORDER — LABETALOL HCL 5 MG/ML IV SOLN
10.0000 mg | INTRAVENOUS | Status: DC | PRN
  Administered 2017-11-27 (×2): 40 mg via INTRAVENOUS
  Administered 2017-11-29 – 2017-11-30 (×5): 20 mg via INTRAVENOUS
  Administered 2017-12-03: 10 mg via INTRAVENOUS
  Administered 2017-12-03: 20 mg via INTRAVENOUS
  Administered 2017-12-03: 10 mg via INTRAVENOUS
  Administered 2017-12-04: 20 mg via INTRAVENOUS
  Administered 2017-12-04: 40 mg via INTRAVENOUS
  Administered 2017-12-04: 20 mg via INTRAVENOUS
  Administered 2017-12-04: 30 mg via INTRAVENOUS
  Administered 2017-12-04 – 2017-12-05 (×3): 20 mg via INTRAVENOUS
  Administered 2017-12-05: 40 mg via INTRAVENOUS
  Administered 2017-12-05 – 2017-12-06 (×4): 20 mg via INTRAVENOUS
  Administered 2017-12-06: 30 mg via INTRAVENOUS
  Administered 2017-12-07: 20 mg via INTRAVENOUS
  Administered 2017-12-07: 40 mg via INTRAVENOUS
  Administered 2017-12-07 – 2017-12-08 (×5): 20 mg via INTRAVENOUS
  Administered 2017-12-09 (×5): 40 mg via INTRAVENOUS
  Filled 2017-11-27 (×6): qty 4
  Filled 2017-11-27 (×2): qty 8
  Filled 2017-11-27 (×3): qty 4
  Filled 2017-11-27 (×2): qty 8
  Filled 2017-11-27 (×3): qty 4
  Filled 2017-11-27 (×4): qty 8
  Filled 2017-11-27 (×2): qty 4
  Filled 2017-11-27 (×5): qty 8
  Filled 2017-11-27 (×3): qty 4

## 2017-11-27 MED ORDER — FOLIC ACID 5 MG/ML IJ SOLN
1.0000 mg | Freq: Every day | INTRAMUSCULAR | Status: DC
  Administered 2017-11-27 – 2017-12-02 (×6): 1 mg via INTRAVENOUS
  Filled 2017-11-27 (×8): qty 0.2

## 2017-11-27 MED ORDER — SODIUM CHLORIDE 0.9 % IV SOLN
0.0125 ug/kg/min | INTRAVENOUS | Status: AC
  Administered 2017-11-27: .1 ug/kg/min via INTRAVENOUS
  Filled 2017-11-27: qty 2000

## 2017-11-27 MED ORDER — PROPOFOL 500 MG/50ML IV EMUL
INTRAVENOUS | Status: DC | PRN
  Administered 2017-11-27: 13:00:00 via INTRAVENOUS
  Administered 2017-11-27: 100 ug/kg/min via INTRAVENOUS

## 2017-11-27 MED ORDER — STROKE: EARLY STAGES OF RECOVERY BOOK
Freq: Once | Status: AC
  Administered 2017-12-05: 05:00:00
  Filled 2017-11-27 (×3): qty 1

## 2017-11-27 MED ORDER — CEFAZOLIN SODIUM-DEXTROSE 1-4 GM/50ML-% IV SOLN
1.0000 g | Freq: Three times a day (TID) | INTRAVENOUS | Status: AC
  Administered 2017-11-27 – 2017-11-28 (×2): 1 g via INTRAVENOUS
  Filled 2017-11-27 (×2): qty 50

## 2017-11-27 MED ORDER — SODIUM CHLORIDE 0.9 % IV SOLN
INTRAVENOUS | Status: DC | PRN
  Administered 2017-11-27: 11:00:00 via INTRAVENOUS

## 2017-11-27 MED ORDER — NALOXONE HCL 0.4 MG/ML IJ SOLN: 0.0800 mg | INTRAMUSCULAR | Status: DC | PRN

## 2017-11-27 MED ORDER — THROMBIN 5000 UNITS EX SOLR
CUTANEOUS | Status: AC
  Filled 2017-11-27: qty 5000

## 2017-11-27 MED ORDER — BACITRACIN ZINC 500 UNIT/GM EX OINT
TOPICAL_OINTMENT | CUTANEOUS | Status: DC | PRN
  Administered 2017-11-27: 1 via TOPICAL

## 2017-11-27 MED ORDER — IOPAMIDOL (ISOVUE-370) INJECTION 76%
INTRAVENOUS | Status: AC
  Filled 2017-11-27: qty 50

## 2017-11-27 MED ORDER — LEVETIRACETAM IN NACL 500 MG/100ML IV SOLN: 500.0000 mg | Freq: Two times a day (BID) | INTRAVENOUS | Status: DC

## 2017-11-27 MED ORDER — ACETAMINOPHEN 325 MG PO TABS
650.0000 mg | ORAL_TABLET | ORAL | Status: DC | PRN
  Administered 2017-12-10 – 2018-01-05 (×8): 650 mg via ORAL
  Filled 2017-11-27 (×7): qty 2

## 2017-11-27 MED ORDER — PHENYLEPHRINE 40 MCG/ML (10ML) SYRINGE FOR IV PUSH (FOR BLOOD PRESSURE SUPPORT)
PREFILLED_SYRINGE | INTRAVENOUS | Status: AC
  Filled 2017-11-27: qty 10

## 2017-11-27 MED ORDER — CLEVIDIPINE 50 MG/100ML IV EMUL
1.0000 mg/h | INTRAVENOUS | Status: AC
  Administered 2017-11-27: 20 mg/h via INTRAVENOUS
  Administered 2017-11-27: 7 mg/h via INTRAVENOUS
  Filled 2017-11-27: qty 100

## 2017-11-27 MED ORDER — FENTANYL CITRATE (PF) 250 MCG/5ML IJ SOLN
INTRAMUSCULAR | Status: AC
  Filled 2017-11-27: qty 5

## 2017-11-27 MED ORDER — FAMOTIDINE IN NACL 20-0.9 MG/50ML-% IV SOLN
20.0000 mg | Freq: Two times a day (BID) | INTRAVENOUS | Status: DC
  Administered 2017-11-27 – 2017-12-01 (×10): 20 mg via INTRAVENOUS
  Filled 2017-11-27 (×11): qty 50

## 2017-11-27 MED ORDER — CLEVIDIPINE BUTYRATE 0.5 MG/ML IV EMUL
0.0000 mg/h | INTRAVENOUS | Status: DC
  Administered 2017-11-27: 2 mg/h via INTRAVENOUS
  Administered 2017-11-27: 1 mg/h via INTRAVENOUS

## 2017-11-27 MED ORDER — THIAMINE HCL 100 MG/ML IJ SOLN
100.0000 mg | Freq: Every day | INTRAMUSCULAR | Status: DC
  Administered 2017-11-27 – 2017-12-03 (×7): 100 mg via INTRAVENOUS
  Filled 2017-11-27 (×7): qty 2

## 2017-11-27 MED ORDER — LEVETIRACETAM IN NACL 1000 MG/100ML IV SOLN
1000.0000 mg | INTRAVENOUS | Status: AC
  Administered 2017-11-27: 1000 mg via INTRAVENOUS
  Filled 2017-11-27: qty 100

## 2017-11-27 MED ORDER — PROMETHAZINE HCL 25 MG PO TABS: 12.5000 mg | ORAL_TABLET | ORAL | Status: DC | PRN

## 2017-11-27 MED ORDER — SENNOSIDES-DOCUSATE SODIUM 8.6-50 MG PO TABS
1.0000 | ORAL_TABLET | Freq: Two times a day (BID) | ORAL | Status: DC
  Administered 2017-11-27 – 2017-12-18 (×33): 1 via ORAL
  Filled 2017-11-27 (×34): qty 1

## 2017-11-27 MED ORDER — SODIUM CHLORIDE 0.9 % IV SOLN
INTRAVENOUS | Status: DC | PRN
  Administered 2017-11-27: 12:00:00

## 2017-11-27 MED ORDER — THROMBIN 5000 UNITS EX SOLR
OROMUCOSAL | Status: DC | PRN
  Administered 2017-11-27: 12:00:00 via TOPICAL

## 2017-11-27 MED ORDER — ACETAMINOPHEN 160 MG/5ML PO SOLN
650.0000 mg | ORAL | Status: DC | PRN
  Administered 2017-11-27 – 2017-12-15 (×40): 650 mg
  Filled 2017-11-27 (×43): qty 20.3

## 2017-11-27 MED ORDER — LABETALOL HCL 5 MG/ML IV SOLN
10.0000 mg | Freq: Once | INTRAVENOUS | Status: AC
  Administered 2017-11-27: 20 mg via INTRAVENOUS

## 2017-11-27 MED ORDER — SODIUM CHLORIDE 0.9 % IV SOLN
INTRAVENOUS | Status: DC | PRN
  Administered 2017-11-27: 1000 mL via INTRAVENOUS
  Administered 2017-11-28: 250 mL via INTRAVENOUS
  Administered 2017-12-03: 500 mL via INTRAVENOUS

## 2017-11-27 MED ORDER — CEFAZOLIN SODIUM-DEXTROSE 2-3 GM-%(50ML) IV SOLR
INTRAVENOUS | Status: DC | PRN
  Administered 2017-11-27: 2 g via INTRAVENOUS

## 2017-11-27 MED ORDER — THROMBIN (RECOMBINANT) 20000 UNITS EX SOLR
CUTANEOUS | Status: AC
  Filled 2017-11-27: qty 20000

## 2017-11-27 MED ORDER — ETOMIDATE 2 MG/ML IV SOLN
INTRAVENOUS | Status: AC | PRN
  Administered 2017-11-27: 20 mg via INTRAVENOUS

## 2017-11-27 MED ORDER — ROCURONIUM BROMIDE 50 MG/5ML IV SOLN
INTRAVENOUS | Status: AC | PRN
  Administered 2017-11-27: 70 mg via INTRAVENOUS

## 2017-11-27 SURGICAL SUPPLY — 97 items
BAG DECANTER FOR FLEXI CONT (MISCELLANEOUS) ×2 IMPLANT
BATTERY IQ STERILE (MISCELLANEOUS) IMPLANT
BENZOIN TINCTURE PRP APPL 2/3 (GAUZE/BANDAGES/DRESSINGS) ×2 IMPLANT
BIT DRILL WIRE PASS 1.3MM (BIT) IMPLANT
BLADE 11 SAFETY STRL DISP (BLADE) ×2 IMPLANT
BLADE SAW GIGLI 16 STRL (MISCELLANEOUS) IMPLANT
BLADE SURG 15 STRL LF DISP TIS (BLADE) ×1 IMPLANT
BLADE SURG 15 STRL SS (BLADE) ×1
BLADE ULTRA TIP 2M (BLADE) IMPLANT
BNDG GAUZE ELAST 4 BULKY (GAUZE/BANDAGES/DRESSINGS) IMPLANT
BNDG STRETCH 4X75 NS LF (GAUZE/BANDAGES/DRESSINGS) IMPLANT
BNDG STRETCH 4X75 STRL LF (GAUZE/BANDAGES/DRESSINGS) ×2 IMPLANT
BUR ACORN 6.0 PRECISION (BURR) ×2 IMPLANT
BUR MATCHSTICK NEURO 3.0 LAGG (BURR) IMPLANT
BUR ROUND FLUTED 4 SOFT TCH (BURR) IMPLANT
BUR SPIRAL ROUTER 2.3 (BUR) ×2 IMPLANT
CABLE BIPOLOR RESECTION CORD (MISCELLANEOUS) ×2 IMPLANT
CANISTER SUCT 3000ML PPV (MISCELLANEOUS) ×2 IMPLANT
CARTRIDGE OIL MAESTRO DRILL (MISCELLANEOUS) ×1 IMPLANT
CLIP VESOCCLUDE MED 6/CT (CLIP) IMPLANT
CONT SPEC 4OZ CLIKSEAL STRL BL (MISCELLANEOUS) ×2 IMPLANT
DECANTER SPIKE VIAL GLASS SM (MISCELLANEOUS) ×4 IMPLANT
DIFFUSER DRILL AIR PNEUMATIC (MISCELLANEOUS) ×2 IMPLANT
DRAPE MICROSCOPE LEICA (MISCELLANEOUS) IMPLANT
DRAPE NEUROLOGICAL W/INCISE (DRAPES) ×2 IMPLANT
DRAPE SHEET LG 3/4 BI-LAMINATE (DRAPES) ×2 IMPLANT
DRAPE WARM FLUID 44X44 (DRAPE) ×2 IMPLANT
DRESSING TELFA 8X10 (GAUZE/BANDAGES/DRESSINGS) ×2 IMPLANT
DRILL WIRE PASS 1.3MM (BIT)
DRSG ADAPTIC 3X8 NADH LF (GAUZE/BANDAGES/DRESSINGS) IMPLANT
DRSG TELFA 3X8 NADH (GAUZE/BANDAGES/DRESSINGS) ×2 IMPLANT
DURAPREP 6ML APPLICATOR 50/CS (WOUND CARE) ×4 IMPLANT
ELECT REM PT RETURN 9FT ADLT (ELECTROSURGICAL) ×2
ELECTRODE REM PT RTRN 9FT ADLT (ELECTROSURGICAL) ×1 IMPLANT
EVACUATOR SILICONE 100CC (DRAIN) IMPLANT
GAUZE 4X4 16PLY RFD (DISPOSABLE) ×2 IMPLANT
GAUZE SPONGE 4X4 12PLY STRL (GAUZE/BANDAGES/DRESSINGS) IMPLANT
GLOVE BIO SURGEON STRL SZ7.5 (GLOVE) ×4 IMPLANT
GLOVE BIO SURGEON STRL SZ8 (GLOVE) ×2 IMPLANT
GLOVE BIOGEL PI IND STRL 7.5 (GLOVE) ×1 IMPLANT
GLOVE BIOGEL PI IND STRL 8 (GLOVE) ×1 IMPLANT
GLOVE BIOGEL PI IND STRL 8.5 (GLOVE) ×1 IMPLANT
GLOVE BIOGEL PI INDICATOR 7.5 (GLOVE) ×1
GLOVE BIOGEL PI INDICATOR 8 (GLOVE) ×1
GLOVE BIOGEL PI INDICATOR 8.5 (GLOVE) ×1
GLOVE ECLIPSE 7.0 STRL STRAW (GLOVE) ×4 IMPLANT
GLOVE ECLIPSE 7.5 STRL STRAW (GLOVE) ×2 IMPLANT
GLOVE EXAM NITRILE LRG STRL (GLOVE) IMPLANT
GLOVE EXAM NITRILE XL STR (GLOVE) IMPLANT
GLOVE EXAM NITRILE XS STR PU (GLOVE) IMPLANT
GLOVE SURG SS PI 6.0 STRL IVOR (GLOVE) ×2 IMPLANT
GOWN STRL REUS W/ TWL LRG LVL3 (GOWN DISPOSABLE) ×3 IMPLANT
GOWN STRL REUS W/ TWL XL LVL3 (GOWN DISPOSABLE) ×1 IMPLANT
GOWN STRL REUS W/TWL 2XL LVL3 (GOWN DISPOSABLE) ×2 IMPLANT
GOWN STRL REUS W/TWL LRG LVL3 (GOWN DISPOSABLE) ×3
GOWN STRL REUS W/TWL XL LVL3 (GOWN DISPOSABLE) ×1
HEMOSTAT POWDER KIT SURGIFOAM (HEMOSTASIS) ×2 IMPLANT
HEMOSTAT SURGICEL 2X14 (HEMOSTASIS) ×2 IMPLANT
HOOK DURA (MISCELLANEOUS) ×2 IMPLANT
KIT BASIN OR (CUSTOM PROCEDURE TRAY) ×2 IMPLANT
KIT DRAIN CSF ACCUDRAIN (MISCELLANEOUS) IMPLANT
KIT TURNOVER KIT B (KITS) ×2 IMPLANT
NEEDLE HYPO 22GX1.5 SAFETY (NEEDLE) ×2 IMPLANT
NS IRRIG 1000ML POUR BTL (IV SOLUTION) ×6 IMPLANT
OIL CARTRIDGE MAESTRO DRILL (MISCELLANEOUS) ×2
PACK CRANIOTOMY CUSTOM (CUSTOM PROCEDURE TRAY) ×2 IMPLANT
PAD ARMBOARD 7.5X6 YLW CONV (MISCELLANEOUS) ×2 IMPLANT
PATTIES SURGICAL .25X.25 (GAUZE/BANDAGES/DRESSINGS) IMPLANT
PATTIES SURGICAL .5 X.5 (GAUZE/BANDAGES/DRESSINGS) IMPLANT
PATTIES SURGICAL .5 X3 (DISPOSABLE) IMPLANT
PATTIES SURGICAL 1/4 X 3 (GAUZE/BANDAGES/DRESSINGS) IMPLANT
PATTIES SURGICAL 1X1 (DISPOSABLE) IMPLANT
PIN MAYFIELD SKULL DISP (PIN) IMPLANT
PLATE 1.5  2HOLE LNG NEURO (Plate) ×3 IMPLANT
PLATE 1.5 2HOLE LNG NEURO (Plate) ×3 IMPLANT
RUBBERBAND STERILE (MISCELLANEOUS) IMPLANT
SCREW SELF DRILL HT 1.5/4MM (Screw) ×12 IMPLANT
SPONGE NEURO XRAY DETECT 1X3 (DISPOSABLE) IMPLANT
SPONGE SURGIFOAM ABS GEL 100 (HEMOSTASIS) ×2 IMPLANT
SPONGE SURGIFOAM ABS GEL 100C (HEMOSTASIS) IMPLANT
STAPLER VISISTAT 35W (STAPLE) ×4 IMPLANT
STOCKINETTE 6  STRL (DRAPES) ×1
STOCKINETTE 6 STRL (DRAPES) ×1 IMPLANT
SUT ETHILON 3 0 FSL (SUTURE) IMPLANT
SUT NURALON 4 0 TR CR/8 (SUTURE) ×2 IMPLANT
SUT VIC AB 0 CT1 18XCR BRD8 (SUTURE) ×1 IMPLANT
SUT VIC AB 0 CT1 8-18 (SUTURE) ×1
SUT VIC AB 3-0 SH 8-18 (SUTURE) ×2 IMPLANT
TAPE CLOTH 1X10 TAN NS (GAUZE/BANDAGES/DRESSINGS) IMPLANT
TIP NONSTICK .5MMX23CM (INSTRUMENTS) ×1
TIP NONSTICK .5X23 (INSTRUMENTS) ×1 IMPLANT
TOWEL GREEN STERILE (TOWEL DISPOSABLE) ×2 IMPLANT
TOWEL GREEN STERILE FF (TOWEL DISPOSABLE) ×2 IMPLANT
TRAY FOLEY MTR SLVR 16FR STAT (SET/KITS/TRAYS/PACK) ×2 IMPLANT
TUBE CONNECTING 20X1/4 (TUBING) ×2 IMPLANT
UNDERPAD 30X30 (UNDERPADS AND DIAPERS) IMPLANT
WATER STERILE IRR 1000ML POUR (IV SOLUTION) ×2 IMPLANT

## 2017-11-27 NOTE — Progress Notes (Signed)
Arterial line under-dampened and reading artificially high, will be going by cuff pressure from here on out

## 2017-11-27 NOTE — H&P (Signed)
Neurology Consultation  Reason for Consult: Intraparenchymal bleed Referring Physician: Patria Mane  CC: Parenchymal bleed  History is obtained from: Husband  HPI: Tammie Miles is a 42 y.o. Female with PMH of HTN  On hypertensive medications, antidepressants, stimulants, and Xanax which are all prescribed by her primary physician.  Per her daughter's  father,  she was last seen normal at approximately 1320 on 11/27/2017.  Apparently when she arrived home patient went up and went to sleep.  Upon waking she was not acting correctly and was dropping things thus the 98 year old daughter called the father multiple times saying the mom was not acting correctly.  He went to check on her and thought possibly this could be that she was drunk and she was obtunded but moving her arms.  different and obtunded. This morning she remained obtunded.  He then immediately called 911.  Patient was brought to the emergency department as a code stroke.  Per husband there is no use of cocaine or stimulants other than what is prescribed that he knows of  ED course-patient was immediately brought to the CT scan where a large area of hemorrhagic left basal ganglia and left frontal lobe was noted-5.8 x 3.5 cm there was also a 15 mm shift from left to right with early herniation. Patient received mannitol and  Neurosurgery was immediately called for decompression.     LKW: 1420 on 11/26/2017 tpa given?: no, parenchymal bleed Premorbid modified Rankin scale (mRS): 0 NIH stroke scale was 27 ICH Score: 3   ROS:  Unable to obtain due to altered mental status.   Past Medical History:  Diagnosis Date  . ADHD   . Anxiety   . Depression   . Hypertension      Family History  Problem Relation Age of Onset  . Hypertension Mother   . Hypertension Father      Social History:   reports that she drinks alcohol. Her tobacco and drug histories are not on file.  Medications  Current Facility-Administered Medications:  .   sodium chloride 0.9 % bolus 1,000 mL, 1,000 mL, Intravenous, Once, Last Rate: 983.6 mL/hr at 11/27/17 1001, 1,000 mL at 11/27/17 1001 **AND** 0.9 %  sodium chloride infusion, , Intravenous, Continuous, Sofia, Leslie K, PA-C .  clevidipine (CLEVIPREX) 0.5 MG/ML infusion, , , ,  .  clevidipine (CLEVIPREX) infusion 0.5 mg/mL, 0-21 mg/hr, Intravenous, Continuous, Azalia Bilis, MD, Last Rate: 12 mL/hr at 11/27/17 1006, 6 mg/hr at 11/27/17 1006 .  etomidate (AMIDATE) injection, , Intravenous, PRN, Azalia Bilis, MD, 20 mg at 11/27/17 1012 .  iopamidol (ISOVUE-370) 76 % injection, , , ,  .  iopamidol (ISOVUE-370) 76 % injection, , , ,  .  nicardipine (CARDENE) 20mg  in 0.86% saline IV infusion (0.1 mg/ml), 3-15 mg/hr, Intravenous, Once, Azalia Bilis, MD .  propofol (DIPRIVAN) 1000 MG/100ML infusion, 5-80 mcg/kg/min, Intravenous, Once, Azalia Bilis, MD .  rocuronium Jettie Booze) injection, , Intravenous, PRN, Azalia Bilis, MD, 70 mg at 11/27/17 1012 No current outpatient medications on file.   Exam: Current vital signs: BP (!) 185/109   Pulse 63   Resp (!) 34   SpO2 100%  Vital signs in last 24 hours: Pulse Rate:  [58-95] 63 (09/19 1005) Resp:  [30-34] 34 (09/19 1005) BP: (111-243)/(71-158) 185/109 (09/19 1005) SpO2:  [98 %-100 %] 100 % (09/19 1005)  GENERAL: Obtunded ABDOMEN - Soft, nontender, nondistended with normoactive BS Ext: warm, well perfused, intact peripheral pulses, __ edema  NEURO:  Mental Status: Obtunded, able to follow only simple commands such as squeezing hand Cranial Nerves: PERRL 2 mm and sluggish .  Doll's intact with skew deviation when eyes are open, does have a slight right facial droop and air leak on the right.   Motor: Moving both arm and leg antigravity spontaneously on the left, able to withdraw from pain on the right leg by bending the knee and dorsiflexing ankle, does not move right arm to noxious stimulation. Tone: is normal and bulk is  normal Sensation-draws to noxious stimuli on the left arm and leg and the right leg Coordination: Unable to examine Gait-able to examine  Labs I have reviewed labs in epic and the results pertinent to this consultation are:   CBC    Component Value Date/Time   WBC 16.4 (H) 11/27/2017 0933   RBC 4.97 11/27/2017 0933   HGB 13.6 11/27/2017 0948   HCT 40.0 11/27/2017 0948   PLT 335 11/27/2017 0933   MCV 77.9 (L) 11/27/2017 0933   MCH 23.7 (L) 11/27/2017 0933   MCHC 30.5 11/27/2017 0933   RDW 18.1 (H) 11/27/2017 0933   LYMPHSABS 2.2 10/28/2009 1258   MONOABS 0.4 10/28/2009 1258   EOSABS 0.2 10/28/2009 1258   BASOSABS 0.0 10/28/2009 1258    CMP     Component Value Date/Time   NA 138 11/27/2017 0948   K 2.9 (L) 11/27/2017 0948   CL 100 11/27/2017 0948   CO2 25 11/27/2017 0933   GLUCOSE 141 (H) 11/27/2017 0948   BUN 12 11/27/2017 0948   CREATININE 0.80 11/27/2017 0948   CALCIUM 9.2 11/27/2017 0933   PROT 7.6 11/27/2017 0933   ALBUMIN 3.8 11/27/2017 0933   AST 24 11/27/2017 0933   ALT 15 11/27/2017 0933   ALKPHOS 83 11/27/2017 0933   BILITOT 1.2 11/27/2017 0933   GFRNONAA >60 11/27/2017 0933   GFRAA >60 11/27/2017 0933    Lipid Panel  No results found for: CHOL, TRIG, HDL, CHOLHDL, VLDL, LDLCALC, LDLDIRECT   Imaging I have reviewed the images obtained:  CT-scan of the brain   Felicie Morn PA-C [facility] [phone]  M-F  (9:00 am- 5:00 PM)  11/27/2017, 10:20 AM    NEUROHOSPITALIST ADDENDUM Performed a face to face diagnostic evaluation.    PA/ARNP/Resident and agree with above documentation.  I have discussed and formulated the above plan as documented below. Edits to the note have been made as needed.   42 y.o. Female with PMH of HTN, stimulant use presents with large hemorrhagic left basal ganglia and left frontal lobe was noted-5.8 x 3.5 cm there was also a 15 mm shift from left to right with early herniation. Underwent cranitomy and  evacuation by Neurosurgery.     Large left basal ganglia and frontal hemorrhage with 15 mm midline shift and early uncal herniation s/p Craniotomy and evacuation. Left MCA occlusion vs vasospasm, also severe stenosis of left ICA, and bilateral ACA vasospasm.  Etiology: suspect HTN hemorrhage, also hemorrhagic conversion of ischemic stroke possible   Plan  Admitted to Neuro ICU No antiplatelets/AC Repeat CT scan post evacuation and craniotomy shows improved midline shift Will get MRI brain tonight  Continue Nicardipine for blood pressure control  3% NS for cerebral edema Close neurochecks  HTN emergency Started Cardene gtt,  Goal less than 140/90  Respiratory support - Intubated in ER - PCCM assistance appreciated   Full code DVT PPX; SCD    Derra Shartzer MD [facility] [phone]   If 7pm  to 7am, please call on call as listed on AMION.    This patient is neurologically critically ill due to large left BG and frontal ICH and possible Left MCA stroke.  She is at risk for significant risk of neurological worsening from cerebral edema,  death from brain herniation, heart failure, worsening hemorrhage, infection, respiratory failure and seizure. This patient's care requires constant monitoring of vital signs, hemodynamics, respiratory and cardiac monitoring, review of multiple databases, neurological assessment, discussion with family, other specialists and medical decision making of high complexity.  I spent  90 minutes of neurocritical time in the care of this patient.

## 2017-11-27 NOTE — Op Note (Signed)
NEUROSURGERY OPERATIVE NOTE   PREOP DIAGNOSIS:  1. Left frontal hemorrhage   POSTOP DIAGNOSIS: Same  PROCEDURE: 1. Left frontotemporal craniotomy for evacuation of hematoma  SURGEON: Dr. Lisbeth RenshawNeelesh Oland Arquette, MD  ASSISTANT: Dr. Maeola HarmanJoseph Stern, MD  ANESTHESIA: General Endotracheal  EBL: 150cc  SPECIMENS: None  DRAINS: None  COMPLICATIONS: None immediate  CONDITION: Hemodynamically stable to ICU  HISTORY: Tammie Miles is a 42 y.o. female presented to the emergency department via EMS after family found her unresponsive this morning.  Her CAT scan demonstrated a large left frontal hematoma with significant mass-effect and midline shift.  In the ER she continued to progressively decline from a neurologic standpoint, requiring endotracheal intubation for airway protection.  CT angiogram has not demonstrated any arteriovenous malformation or aneurysm.  After lengthy discussion with the patient's family, they elected to proceed with evacuation of the hematoma as a lifesaving procedure.  Risks and benefits of the surgery were explained in detail.  Informed consent was obtained.  PROCEDURE IN DETAIL: The patient was brought to the operating room. After induction of general anesthesia, the patient was positioned on the operative table in the supine position with a left-sided shoulder roll. All pressure points were meticulously padded.  Standard left frontotemporal skin incision was then marked out and prepped and draped in the usual sterile fashion.  After timeout was conducted, incision was opened sharply and carried through the galea.  Raney clips were placed for hemostasis on the skin edges.  The Bovie electrocautery was then used to dissected through the periosteum, temporalis fascia and muscle, and a single piece myocutaneous flap was elevated and retracted anteriorly.  A bur hole was then placed in the pterion, and 1 along the superior temporal line.  High-speed drill was then used to create  a frontotemporal bone flap which was elevated in one piece.  Hemostasis was secured on the epidural plane with bipolar electrocautery.  The dura was then opened in cruciate fashion.  The underlying brain appeared to be fairly bland, without significant amount of bleeding once the p.o. was incised suggesting nonperfused brain.  Once the pia was opened, the bipolar electrocautery and suction were used to dissected through the inferior left frontal lobe, and approximately 1 cm deep I immediately encountered a large hematoma.  This was removed using suction and blunt dissection.  Attention was then turned more posteriorly, where more hematoma was removed.  Did encounter some thin fluid suggestive of CSF, although I did not definitively enter the ventricle or see choroid plexus.  Once the hematoma was evacuated I was able to visualize the surrounding white matter of the hematoma cavity suggesting complete removal of the hematoma.  The wound was then irrigated with copious amounts of normal saline irrigation.  I did encounter some venous bleeding along the inferior aspect of the craniotomy in the subdural space.  I was able to inspect along the proximal sylvian fissure, and bleeding was controlled with morselized Gelfoam and thrombin.  In a similar fashion, hemostasis was secured inside the hematoma cavity with morselized Gelfoam and thrombin.  Once hemostasis was secured, the dural leaflets were replaced over the brain.  The bone flap was replaced and plated with standard titanium plates and screws.  The wound was irrigated again with normal saline irrigation.  The wound was then closed in layers using interrupted 0 and 3-0 Vicryl stitches.  Skin was closed with skin staples.  Bacitracin ointment and sterile dressing was applied.  At the end of the case all  sponge, needle, instrument, and cottonoid counts were correct.  The patient was then transferred to the bed and taken to the intensive care unit in stable  hemodynamic condition.

## 2017-11-27 NOTE — ED Notes (Signed)
Vinnie,PA paged to [phone]-per Keenan BachelorSofia, PA-paged by Marylene LandAngela

## 2017-11-27 NOTE — Progress Notes (Signed)
RT NOTE: RT transported patient from room 4N17 to CT and back to 4N17 with no complications. Vitals are stable. RT will continue to monitor.

## 2017-11-27 NOTE — Consult Note (Signed)
Please See H and P note

## 2017-11-27 NOTE — ED Triage Notes (Addendum)
Pt arrives to ED via [facility] from home with c/o of altered mental status. Pt lives at home with her 42 year old child and per ems child called boyfriend last night and told him his mother was not awake- per ems boyfriend drove to her house this morning took the child to school and then called ems to home. Pt arrives making no verbal response, no response to pain. Only moving left side of her body.

## 2017-11-27 NOTE — ED Notes (Signed)
Pt placed on vent and preparing to transport back to CT and possible ly OR.

## 2017-11-27 NOTE — ED Notes (Signed)
Arrived back to room from CT with pt. Family at bedside [facility].

## 2017-11-27 NOTE — Transfer of Care (Signed)
Immediate Anesthesia Transfer of Care Note  Patient: Tammie GibsonKelly L Heckel  Procedure(s) Performed: CRANIOTOMY for Evacuation of Intracranial Hemorrhage (Left )  Patient Location: ICU  Anesthesia Type:General  Level of Consciousness: sedated and Patient remains intubated per anesthesia plan  Airway & Oxygen Therapy: Patient remains intubated per anesthesia plan and Patient placed on Ventilator (see vital sign flow sheet for setting)  Post-op Assessment: Report given to RN and Post -op Vital signs reviewed and stable  Post vital signs: Reviewed and stable  Last Vitals:  Vitals Value Taken Time  BP 147/84 11/27/2017  1:05 PM  Temp    Pulse 93 11/27/2017  1:07 PM  Resp 22 11/27/2017  1:07 PM  SpO2 100 % 11/27/2017  1:07 PM  Vitals shown include unvalidated device data.  Last Pain: There were no vitals filed for this visit.       Complications: No apparent anesthesia complications

## 2017-11-27 NOTE — Progress Notes (Signed)
RT NOTES: Patient intubated for airway protection d/t patient being unresponsive, Code Stroke. Patient immediately taken to CT after intubation and then to OR. Handoff given to CRNA in short stay. Patient on PRVC 500/16/+5/100%.

## 2017-11-27 NOTE — Anesthesia Postprocedure Evaluation (Signed)
Anesthesia Post Note  Patient: Tammie Miles  Procedure(s) Performed: CRANIOTOMY for Evacuation of Intracranial Hemorrhage (Left )     Patient location during evaluation: ICU Anesthesia Type: General Level of consciousness: sedated and patient remains intubated per anesthesia plan Pain management: pain level controlled Vital Signs Assessment: post-procedure vital signs reviewed and stable Respiratory status: patient remains intubated per anesthesia plan Cardiovascular status: stable Postop Assessment: no apparent nausea or vomiting Anesthetic complications: no    Last Vitals:  Vitals:   11/27/17 1415 11/27/17 1430  BP: 117/81   Pulse: (!) 105 89  Resp: (!) 24 (!) 25  Temp:    SpO2: 100% 100%    Last Pain:  Vitals:   11/27/17 1400  TempSrc: Axillary                 Beryle Lathehomas E Brock

## 2017-11-27 NOTE — Code Documentation (Signed)
42 yo female coming from home where she was last seen normal by her daughter last night. Pt was found on the ground by her boyfriend when the daughter called him because she wasn't acting normal. Boyfriend thought she was intoxicated and helped her into bed. When he returned this morning to check on her, she was still asleep and he called EMS. [facility] Reported she was unresponsive. Pt arrived to the ED and EDP evaluated patient. Called Neurology for a consult. Neurology activated a Code Stroke once CT Head showed hemorrhage. Stroke Team arrived to the patient at 0953. 20 mg of labetalol given to manage BP. Cleviprex started. 50 mg of Mannitol given per MD Aroor. Pt intubated by EDP due to be unresponsive. Neurosurgery consulted and agreed to take patient to OR. Pt transported to CT for CTA. 20 mg of Labetalol given during transport. Patient taken directly to OR. Handoff given to CRNA. Pt to be admitted to [facility] after surgery.

## 2017-11-27 NOTE — Progress Notes (Signed)
CRITICAL VALUE STICKER  CRITICAL VALUE: Lactic acid 4.3  PROVIDER NOTIFIED: Brooke, NP  TIME OF NOTIFICATION: paged [phone], face to face notification [phone]  RESPONSE: 500 mL NS bolus and repeat H&H

## 2017-11-27 NOTE — Progress Notes (Signed)
PT Cancellation Note  Patient Details Name: Tammie Miles MRN: [id number] DOB: May 27, 1975   Cancelled Treatment:    Reason Eval/Treat Not Completed: Active bedrest order.  Pt went to OR today for evacuation of hemorrhage.  PT will check back tomorrow.  Thanks,    Rollene Rotundaebecca B. Chelsye Suhre, PT, DPT  Acute Rehabilitation [phone] pager #[phone] office   11/27/2017, 2:18 PM

## 2017-11-27 NOTE — Progress Notes (Signed)
Sedation paused to obtain neuro exam, however pt's BP quickly increased to the 200s and double stacking the ventilator. During this brief period with sedation held, pt spontaneously moved LUE/LLE, and had no eye opening or movement on the right. MD Conchita Paris(Nundkumar and Molli KnockYacoub) aware that pt does not tolerate sedation being weaned at this time, thus limiting the neuro exam.

## 2017-11-27 NOTE — Anesthesia Procedure Notes (Signed)
Arterial Line Insertion Start/End9/19/2019 11:00 AM, 11/27/2017 11:18 AM Performed by: Rogelia BogaMueller, Thomas P, CRNA, attending, CRNA  Patient location: Pre-op. Preanesthetic checklist: patient identified and IV checked Right, radial was placed Catheter size: 20 G Hand hygiene performed  and maximum sterile barriers used   Attempts: 2 Procedure performed without using ultrasound guided technique. Following insertion, Biopatch and dressing applied. Post procedure assessment: normal and unchanged  Patient tolerated the procedure well with no immediate complications.

## 2017-11-27 NOTE — Consult Note (Signed)
NAME:  Tammie GibsonKelly [name], MRN:  [id number], DOB:  1975-07-03, LOS: 0 ADMISSION DATE:  11/27/2017, CONSULTATION DATE:  11/27/2017 REFERRING MD:  Dr. Conchita ParisNundkumar CHIEF COMPLAINT:  Left frontal hemorrhage  Brief History   42 yoF with AMS since 9/18 evening, thought due to intoxication. Still unresponsive this am.  Patient was brought to the ED where a CT was done and showed a large left sided ICH.  Patient was taken to the OR and a mini craniectomy was done.  Patient was brought out to the ICU and PCCM was consulted for vent management.    Significant Hospital Events   9/19 Admit/ OR  Consults: date of consult/date signed off & final recs:  9/19 NSGY 9/19 PCCM   Procedures (surgical and bedside):  9/19 Intubated 9/19 OR for crani, evac of hematoma  Significant [facility] Tests:  [facility]  Large area of hemorrhage in the left basal ganglia and left frontal lobe, 5.8 x 3.5 cm. 15 mm of left-to-right midline shift.  9/19 CTA head 1. Positive for left MCA M1 large vessel occlusion, and also severe stenosis at the left ICA terminus, superimposed on the relatively large acute left hemisphere intra-axial hemorrhage (estimated blood volume 43 mL). Vasospasm suspected in the proximal ACAs. 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 2. Negative for intracranial aneurysm, CTA spot sign, or evidence of vascular malformation in association with the acute hemorrhage. 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 3. Stable intracranial mass effect since 0938 hours today, with 16 mm of leftward midline shift and trapping of the right lateral ventricle.  Micro Data: 9/19 MRSA PCR >>  Antimicrobials:  9/19 Cefazolin (pre-op)  Subjective:  Sedated and intubated post op  Objective   Blood pressure 109/64, pulse (!) 105,  resp. rate (!) 27, height 5\' 1"  (1.549 m), weight 80 kg, SpO2 100 %.    Vent Mode: PRVC FiO2 (%):  [100 %] 100 % Set Rate:  [16 bmp] 16 bmp Vt Set:  [380 mL-500 mL] 380 mL PEEP:  [5 cmH20] 5 cmH20 Plateau Pressure:  [17 cmH20] 17 cmH20   Intake/Output Summary (Last 24 hours) at 11/27/2017 1417 Last data filed at 11/27/2017 1229 Gross per 24 hour  Intake 1000 ml  Output 650 ml  Net 350 ml   Filed Weights   11/27/17 1030  Weight: 80 kg    Examination: General: Acutely ill appearing female, NAD HENT: [place]/AT, PERRL, EOM-I and MMM Lungs: CTA bilaterally Cardiovascular: RRR, Nl S1/S2 and -M/R/G Abdomen: Soft, NT, ND and +BS Extremities: -edema and -tenderness Neuro: Sedated and paralyzed, unable to assess Skin: Intact  Resolved Hospital Problem list    Assessment & Plan:  Acute encephalopathy related to Large Left frontal IPH - S/p OR for craniotomy 9/19 P:  Neurology primary Postop per NSGY Strict blood pressure control SBP < 140 with cleviprex  Hourly neuro checks   Hypertensive crisis, hx HTN P:  cleviprex for SBP < 140 Maintain art line  Acute respiratory insufficiency in the setting of massive IPH - CXR reviewed P : Full MV support 8 cc/kg Wean FiO2/ Peep for sats > 94% Hold SBT ABG now VAP measures  PAD protocol with    Hypokalemia P : Check mag  Possible ETOH abuse - unclear usage P:  empiric thiamine/ folate  Hx ADHD/ depression / anxiety P:  Disposition / Summary of Today's Plan 11/27/17   Tx to ICU for post-op recovery.  To remain on MV and further stabilization of BP.     Diet: NPO Pain/Anxiety/Delirium protocol (if indicated): Propofol and fentanyl VAP protocol (if indicated) indicated DVT prophylaxis: SCDs only  GI prophylaxis: pepcid  Hyperglycemia protocol: CBG q 4; start SSI if CBG > 180 Mobility: BR Code Status: full Family Communication: Family updated bedside  Labs   CBC: Recent Labs  Lab 11/27/17 0933  11/27/17 0948 11/27/17 1202  WBC 16.4*  --   --   HGB 11.8* 13.6 10.9*  HCT 38.7 40.0 32.0*  MCV 77.9*  --   --   PLT 335  --   --    Basic Metabolic Panel: Recent Labs  Lab 11/27/17 0933 11/27/17 0948 11/27/17 1202  NA 140 138 138  K 3.0* 2.9* 2.9*  CL 98 100  --   CO2 25  --   --   GLUCOSE 144* 141*  --   BUN 11 12  --   CREATININE 1.04* 0.80  --   CALCIUM 9.2  --   --    GFR: Estimated Creatinine Clearance: 87.8 mL/min (by C-G formula based on SCr of 0.8 mg/dL). Recent Labs  Lab 11/27/17 0933 11/27/17 0949  WBC 16.4*  --   LATICACIDVEN  --  4.07*   Liver Function Tests: Recent Labs  Lab 11/27/17 0933  AST 24  ALT 15  ALKPHOS 83  BILITOT 1.2  PROT 7.6  ALBUMIN 3.8   No results for input(s): LIPASE, AMYLASE in the last 168 hours. No results for input(s): AMMONIA in the last 168 hours. ABG    Component Value Date/Time   PHART 7.417 11/27/2017 1202   PCO2ART 33.8 11/27/2017 1202   PO2ART 114.0 (H) 11/27/2017 1202   HCO3 22.0 11/27/2017 1202   TCO2 23 11/27/2017 1202   ACIDBASEDEF 2.0 11/27/2017 1202   O2SAT 99.0 11/27/2017 1202    Coagulation Profile: Recent Labs  Lab 11/27/17 0933  INR 1.06   Cardiac Enzymes: No results for input(s): CKTOTAL, CKMB, CKMBINDEX, TROPONINI in the last 168 hours. HbA1C: No results found for: HGBA1C CBG: Recent Labs  Lab 11/27/17 0933  GLUCAP 120*    Admitting History of Present Illness.   HPI obtained from medical chart review as patient is intubated and sedated on MV.   42 year old female with PMH of HTN, depression, anxiety, and ADHD who presented from home with altered mental status.  Patient lives with her 46 year old daughter and was apparently unable to be awakened yesterday evening.  Daughter called patient's boyfriend who thought she was possibly intoxicated.  This morning, patient still could not be awakened and therefore EMS was called by boyfriend.  On arrival to ER, she was nonverbal and minimally  responsive to pain and only moving left side with decerebrate posturing on right and hypertensive requiring labetalol and cleviprex for blood pressure control.  CT head shows large intraparenchymal bleed with midline shift to left.  CTA without evidence of aneurysm or AVM.  She was intubated for airway protection.  Mannitol given.  Neurosurgery consulted and patient taken to OR for craniotomy.  PCCM consulted for ventilator and medical management.   Review of Systems:   Unable to complete as patient is sedated on mechanical ventilation .   Past medical history  She,  has a past medical history of ADHD, Anxiety, Depression, and Hypertension.     Surgical History  Social History   Social History   Socioeconomic History  . Marital status: Married    Spouse name: Not on file  . Number of children: 1  . Years of education: Not on file  . Highest education level: Not on file  Occupational History  . Not on file  Social Needs  . Financial resource strain: Not on file  . Food insecurity:    Worry: Not on file    Inability: Not on file  . Transportation needs:    Medical: Not on file    Non-medical: Not on file  Tobacco Use  . Smoking status: Not on file  Substance and Sexual Activity  . Alcohol use: Yes  . Drug use: Not on file  . Sexual activity: Yes  Lifestyle  . Physical activity:    Days per week: Not on file    Minutes per session: Not on file  . Stress: Not on file  Relationships  . Social connections:    Talks on phone: Not on file    Gets together: Not on file    Attends religious service: Not on file    Active member of club or organization: Not on file    Attends meetings of clubs or organizations: Not on file    Relationship status: Not on file  . Intimate partner violence:    Fear of current or ex partner: Not on file    Emotionally abused: Not on file    Physically abused: Not on file    Forced sexual activity: Not on file  Other Topics Concern  . Not  on file  Social History Narrative  . Not on file  ,  reports that she drinks alcohol.   Family history   Her family history includes Hypertension in her father and mother.   Allergies No Known Allergies  Home meds  Prior to Admission medications   Not on File    Attending Note:  42 year old female with PMH above who presented after being found unconscious. On exam, lungs are clear.  I reviewed CXR myself, no acute disease noted.  Discussed with neurology MD.  Will maintain sedated and intubated.  Repeat CT, if worsening then may need TLC for 3% saline.  Check ABG and CXR.  Adjust vent for ABG.  PCT and cultures sent for ?aspiration.  No abx started at this point.  PCCM will continue to follow.  The patient is critically ill with multiple organ systems failure and requires high complexity decision making for assessment and support, frequent evaluation and titration of therapies, application of advanced monitoring technologies and extensive interpretation of multiple databases.   Critical Care Time devoted to patient care services described in this note is  45  Minutes. This time reflects time of care of this signee Dr Koren Bound. This critical care time does not reflect procedure time, or teaching time or supervisory time of PA/NP/Med student/Med Resident etc but could involve care discussion time.  Alyson Reedy, M.D. [facility] Pulmonary/Critical Care Medicine. Pager: [phone]. After hours pager: [phone].

## 2017-11-27 NOTE — Progress Notes (Signed)
Spoke with Irving BurtonEmily RN in regards to PICC placement. PICC will not be placed this pm. Patient has 4 working PIVs and no 3% saline ordered at this time. Will re-evaluate in the am.

## 2017-11-27 NOTE — ED Provider Notes (Signed)
[facility] Note   CSN: [id number] Arrival date & time: 11/27/17  [id number]     History   Chief Complaint Chief Complaint  Patient presents with  . Altered Mental Status    HPI Tammie Miles is a 42 y.o. female.  The history is provided by the patient, the EMS personnel and a relative. No language interpreter was used.  Altered Mental Status   This is a new problem. The current episode started yesterday. The problem has not changed since onset.Associated symptoms include unresponsiveness. Pertinent negatives include no confusion. Her past medical history is significant for hypertension.  HIstory by Child's Father.  Pt's child called father last night and said Mother was on the floor.  He reports he put pt in bed but she has not woke up.    No past medical history on file.  There are no active problems to display for this patient.      OB History   None      Home Medications    Prior to Admission medications   Not on File    Family History No family history on file.  Social History Social History   Tobacco Use  . Smoking status: Not on file  Substance Use Topics  . Alcohol use: Not on file  . Drug use: Not on file     Allergies   Patient has no known allergies.   Review of Systems Review of Systems  Unable to perform ROS: Mental status change  Psychiatric/Behavioral: Negative for confusion.     Physical Exam Updated Vital Signs BP (!) 243/119   Pulse 95   Resp (!) 32   SpO2 99%   Physical Exam  Constitutional: She appears well-developed and well-nourished.  HENT:  Head: Normocephalic and atraumatic.  Mouth/Throat: Oropharynx is clear and moist.  Eyes: Pupils are equal, round, and reactive to light.  Cardiovascular: Normal rate and regular rhythm.  Pulmonary/Chest: Effort normal.  Neurological:  Unresponsive,  Withdraws from pain only    Skin: Skin is warm.  Nursing note and vitals  reviewed.    ED Treatments / Results  Labs (all labs ordered are listed, but only abnormal results are displayed) Labs Reviewed  CBC - Abnormal; Notable for the following components:      Result Value   WBC 16.4 (*)    Hemoglobin 11.8 (*)    MCV 77.9 (*)    MCH 23.7 (*)    RDW 18.1 (*)    All other components within normal limits  PROTIME-INR  COMPREHENSIVE METABOLIC PANEL  ETHANOL  RAPID URINE DRUG SCREEN, HOSP PERFORMED  URINALYSIS, COMPLETE (UACMP) WITH MICROSCOPIC  I-STAT BETA HCG BLOOD, ED ([facility], [facility], [facility] ONLY)  CBG MONITORING, ED  I-STAT CHEM 8, ED  I-STAT CG4 LACTIC ACID, ED  TYPE AND SCREEN    EKG None  Radiology No results found.  Procedures .Critical Care Performed by: Elson AreasSofia, Ramond Darnell K, PA-C Authorized by: Elson AreasSofia, Dayelin Balducci K, PA-C   Critical care provider statement:    Critical care time (minutes):  45   Critical care start time:  11/27/2017 9:30 AM   Critical care end time:  11/27/2017 10:20 AM   Critical care was necessary to treat or prevent imminent or life-threatening deterioration of the following conditions:  CNS failure or compromise   Critical care was time spent personally by me on the following activities:  Discussions with consultants, evaluation of patient's response to treatment, examination of patient, ordering  and performing treatments and interventions, ordering and review of laboratory studies, ordering and review of radiographic studies, pulse oximetry, re-evaluation of patient's condition, obtaining history from patient or surrogate and review of old charts   (including critical care time)  Medications Ordered in ED Medications  sodium chloride 0.9 % bolus 1,000 mL (has no administration in time range)    And  0.9 %  sodium chloride infusion (has no administration in time range)  nicardipine (CARDENE) 20mg  in 0.86% saline IV infusion (0.1 mg/ml) (has no administration in time range)  labetalol (NORMODYNE,TRANDATE) injection 10 mg (has no  administration in time range)     Initial Impression / Assessment and Plan / ED Course  I have reviewed the triage vital signs and the nursing notes.  Pertinent labs & imaging results that were available during my care of the patient were reviewed by me and considered in my medical decision making (see chart for details).      MDM  Pt to scanner, labs ordered,   Ct shows large hemorrhage.  Code stroke called,  Dr. Laurence Slate here to see,  Neurosurgery consulted  Angelena Form here to see.   Pt will have ct angio and will go to OR for evacuation of hematoma   Intubation by Dr. Patria Mane  Final Clinical Impressions(s) / ED Diagnoses   Final diagnoses:  Acute intra-cranial hemorrhage (HCC)  Altered mental status, unspecified altered mental status type    ED Discharge Orders    None       Osie Cheeks 11/27/17 1045    Azalia Bilis, MD 11/27/17 1230

## 2017-11-27 NOTE — Anesthesia Preprocedure Evaluation (Addendum)
Anesthesia Evaluation  Patient identified by MRN, date of birth, ID band Patient unresponsive    Reviewed: Allergy & Precautions, NPO status , Patient's Chart, lab work & pertinent test results, Unable to perform ROS - Chart review onlyPreop documentation limited or incomplete due to emergent nature of procedure.  History of Anesthesia Complications Negative for: history of anesthetic complications  Airway Mallampati: Intubated       Dental   Pulmonary   Already intubated for airway protection   breath sounds clear to auscultation   + intubated    Cardiovascular hypertension, Pt. on medications  Rhythm:Regular Rate:Normal     Neuro/Psych Anxiety Depression  Intracranial hemorrhage      GI/Hepatic negative GI ROS, Neg liver ROS,   Endo/Other  negative endocrine ROS  Renal/GU negative Renal ROS  negative genitourinary   Musculoskeletal negative musculoskeletal ROS (+)   Abdominal   Peds  (+) ADHD Hematology negative hematology ROS (+)   Anesthesia Other Findings Hypokalemia   Reproductive/Obstetrics                            Anesthesia Physical Anesthesia Plan  ASA: V and emergent  Anesthesia Plan: General   Post-op Pain Management:    Induction: Intravenous  PONV Risk Score and Plan: 3 and Treatment may vary due to age or medical condition and TIVA  Airway Management Planned: Oral ETT  Additional Equipment: Arterial line  Intra-op Plan:   Post-operative Plan: Post-operative intubation/ventilation  Informed Consent:   Only emergency history available and History available from chart only  Plan Discussed with: CRNA and Anesthesiologist  Anesthesia Plan Comments:        Anesthesia Quick Evaluation

## 2017-11-27 NOTE — ED Notes (Signed)
Per neuro surgery pt was taken from CT scanner to OR. Bedside report was given to CRNA in short stay.

## 2017-11-27 NOTE — Consult Note (Addendum)
Chief Complaint   Chief Complaint  Patient presents with  . Altered Mental Status    HPI   HPI: Tammie Miles is a 42 y.o. female brought in to ER due to altered mental status. Boyfriend and grandmother at bedside. Apparently patient was unable to be awakened yesterday evening but thought due to ?etoh. This am, patient still unable to be awakened so EMS was called.  She arrived unresponsive, nonverbal. Intermittently commands with LUE, right sided plegia. She was intubated for airway protection. She has a history of HTN and on stimulants by report.  There are no active problems to display for this patient.   PMH: Past Medical History:  Diagnosis Date  . ADHD   . Anxiety   . Depression   . Hypertension     PSH:  (Not in a hospital admission)  SH: Social History   Tobacco Use  . Smoking status: Not on file  Substance Use Topics  . Alcohol use: Yes  . Drug use: Not on file    MEDS: Prior to Admission medications   Not on File    ALLERGY: No Known Allergies  Social History   Tobacco Use  . Smoking status: Not on file  Substance Use Topics  . Alcohol use: Yes     Family History  Problem Relation Age of Onset  . Hypertension Mother   . Hypertension Father      ROS   ROS unable to obtain, AMS  Exam   Vitals:   11/27/17 1028 11/27/17 1030  BP: (!) 153/79 (!) 157/88  Pulse: 100 96  Resp: 16 16  SpO2: 100% 100%   Prior to intubation: Pupils 1-27m reactive but sluggish Does not open eyes to command Intermittently squeezes with left hand with extensive encouragement Not following commands with LLE, did move spontaneously Right sided plegic Mild flexion LUE to painful stimulus Negative blink to threat  Results - Imaging/Labs   Results for orders placed or performed during the hospital encounter of 11/27/17 (from the past 48 hour(s))  CBC     Status: Abnormal   Collection Time: 11/27/17  9:33 AM  Result Value Ref Range   WBC 16.4 (H) 4.0 -  10.5 K/uL   RBC 4.97 3.87 - 5.11 MIL/uL   Hemoglobin 11.8 (L) 12.0 - 15.0 g/dL   HCT 38.7 36.0 - 46.0 %   MCV 77.9 (L) 78.0 - 100.0 fL   MCH 23.7 (L) 26.0 - 34.0 pg   MCHC 30.5 30.0 - 36.0 g/dL   RDW 18.1 (H) 11.5 - 15.5 %   Platelets 335 150 - 400 K/uL    Comment: Performed at [facility] [address] [place] [id number] Comprehensive metabolic panel     Status: Abnormal   Collection Time: 11/27/17  9:33 AM  Result Value Ref Range   Sodium 140 135 - 145 mmol/L   Potassium 3.0 (L) 3.5 - 5.1 mmol/L   Chloride 98 98 - 111 mmol/L   CO2 25 22 - 32 mmol/L   Glucose, Bld 144 (H) 70 - 99 mg/dL   BUN 11 6 - 20 mg/dL   Creatinine, Ser 1.04 (H) 0.44 - 1.00 mg/dL   Calcium 9.2 8.9 - 10.3 mg/dL   Total Protein 7.6 6.5 - 8.1 g/dL   Albumin 3.8 3.5 - 5.0 g/dL   AST 24 15 - 41 U/L   ALT 15 0 - 44 U/L   Alkaline Phosphatase 83 38 - 126 U/L  Total Bilirubin 1.2 0.3 - 1.2 mg/dL   GFR calc non Af Amer >60 >60 mL/min   GFR calc Af Amer >60 >60 mL/min    Comment: (NOTE) The eGFR has been calculated using the CKD EPI equation. This calculation has not been validated in all clinical situations. eGFR's persistently <60 mL/min signify possible Chronic Kidney Disease.    Anion gap 17 (H) 5 - 15    Comment: Performed at [facility], [place] [address], [place], [place] [address]  Protime-INR     Status: None   Collection Time: 11/27/17  9:33 AM  Result Value Ref Range   Prothrombin Time 13.7 11.4 - 15.2 seconds   INR 1.06     Comment: Performed at [facility], [place] [address], [place], [place] [address]  I-Stat Beta hCG blood, ED ([facility], [facility], [facility] only)     Status: None   Collection Time: 11/27/17  9:46 AM  Result Value Ref Range   I-stat hCG, quantitative <5.0 <5 mIU/mL   Comment 3            Comment:   GEST. AGE      CONC.  (mIU/mL)   <=1 WEEK        5 - 50     2 WEEKS       50 - 500     3 WEEKS       100 - 10,000     4 WEEKS     1,000 - 30,000        FEMALE AND  NON-PREGNANT FEMALE:     LESS THAN 5 mIU/mL   Type and screen [place]     Status: None (Preliminary result)   Collection Time: 11/27/17  9:48 AM  Result Value Ref Range   ABO/RH(D) O POS    Antibody Screen PENDING    Sample Expiration      11/30/2017 Performed at [facility], [place] [address], [place], [place] [address]   I-Stat Chem 8, ED     Status: Abnormal   Collection Time: 11/27/17  9:48 AM  Result Value Ref Range   Sodium 138 135 - 145 mmol/L   Potassium 2.9 (L) 3.5 - 5.1 mmol/L   Chloride 100 98 - 111 mmol/L   BUN 12 6 - 20 mg/dL   Creatinine, Ser 0.80 0.44 - 1.00 mg/dL   Glucose, Bld 141 (H) 70 - 99 mg/dL   Calcium, Ion 0.98 (L) 1.15 - 1.40 mmol/L   TCO2 25 22 - 32 mmol/L   Hemoglobin 13.6 12.0 - 15.0 g/dL   HCT 40.0 36.0 - 46.0 %  I-Stat CG4 Lactic Acid, ED     Status: Abnormal   Collection Time: 11/27/17  9:49 AM  Result Value Ref Range   Lactic Acid, Venous 4.07 (HH) 0.5 - 1.9 mmol/L   Comment NOTIFIED PHYSICIAN     Ct Head Wo Contrast  Result Date: 11/27/2017 CLINICAL DATA:  Altered mental status EXAM: CT HEAD WITHOUT CONTRAST TECHNIQUE: Contiguous axial images were obtained from the base of the skull through the vertex without intravenous contrast. COMPARISON:  05/22/2009 FINDINGS: Brain: Large area of hemorrhage in the region of the left basal ganglia and extending into the left frontal lobe measuring approximately 5.8 x 3.5 cm. There is 15 mm of left-to-right midline shift. No hydrocephalus. Vascular: No hyperdense vessel or unexpected calcification. Skull: No acute calvarial abnormality. Sinuses/Orbits: Visualized paranasal sinuses and mastoids clear. Orbital soft  tissues unremarkable. Other: None IMPRESSION: Large area of hemorrhage in the left basal ganglia and left frontal lobe, 5.8 x 3.5 cm. 15 mm of left-to-right midline shift. Critical Value/emergent results were called by telephone at the time of interpretation on 11/27/2017 at 9:56  am to Dr. Jola Schmidt , who verbally acknowledged these results. Electronically Signed   By: Rolm Baptise M.D.   On: 11/27/2017 09:57    Impression/Plan   42 y.o. female with acute left basal ganglia and left frontal hemorrhage, likely hypertensive in original though CTA ordered to r/o vascular abnormality given age. The biggest concern currently is increased ICP. Given her young age, she will need to undergo left craniotomy for evacuation of hematoma in order to give her the best chance at survival from this insult. Discussed with family at bedside. OR called and placed on schedule emergently.   - CTA pending at this time  - BP goal <140/90; cleviprex started. Labetalol 10-14m q 142m prn - Defer further management to neurology

## 2017-11-28 ENCOUNTER — Inpatient Hospital Stay (HOSPITAL_COMMUNITY): Payer: Medicaid Other

## 2017-11-28 ENCOUNTER — Encounter (HOSPITAL_COMMUNITY): Payer: Self-pay

## 2017-11-28 ENCOUNTER — Encounter (HOSPITAL_COMMUNITY): Payer: Self-pay | Admitting: Neurosurgery

## 2017-11-28 ENCOUNTER — Inpatient Hospital Stay (HOSPITAL_COMMUNITY): Payer: Medicaid Other | Admitting: Certified Registered Nurse Anesthetist

## 2017-11-28 ENCOUNTER — Other Ambulatory Visit (HOSPITAL_COMMUNITY): Payer: Self-pay

## 2017-11-28 ENCOUNTER — Inpatient Hospital Stay (HOSPITAL_COMMUNITY)
Admission: EM | Disposition: A | Discharge status: Rehabilitation Facility | Payer: Self-pay | Source: Home / Self Care | Attending: Neurology

## 2017-11-28 DIAGNOSIS — J9690 Respiratory failure, unspecified, unspecified whether with hypoxia or hypercapnia: Secondary | ICD-10-CM | POA: Diagnosis present

## 2017-11-28 DIAGNOSIS — I629 Nontraumatic intracranial hemorrhage, unspecified: Secondary | ICD-10-CM | POA: Insufficient documentation

## 2017-11-28 DIAGNOSIS — G936 Cerebral edema: Secondary | ICD-10-CM

## 2017-11-28 HISTORY — PX: CRANIOTOMY: SHX93

## 2017-11-28 LAB — BASIC METABOLIC PANEL
Anion gap: 9 (ref 5–15)
BUN: 8 mg/dL (ref 6–20)
CALCIUM: 7.5 mg/dL — AB (ref 8.9–10.3)
CO2: 19 mmol/L — ABNORMAL LOW (ref 22–32)
CREATININE: 0.88 mg/dL (ref 0.44–1.00)
Chloride: 116 mmol/L — ABNORMAL HIGH (ref 98–111)
GFR calc Af Amer: >60 mL/min (ref >60)
GLUCOSE: 150 mg/dL — AB (ref 70–99)
Potassium: 3.7 mmol/L (ref 3.5–5.1)
Sodium: 144 mmol/L (ref 135–145)

## 2017-11-28 LAB — BLOOD GAS, ARTERIAL
Acid-base deficit: 2.9 mmol/L — ABNORMAL HIGH (ref 0.0–2.0)
Bicarbonate: 20.9 mmol/L (ref 20.0–28.0)
FIO2: 40
MECHVT: 380 mL
O2 Saturation: 97.4 %
PEEP: 5 cmH2O
Patient temperature: 98.6
RATE: 16 resp/min
pCO2 arterial: 33.3 mmHg (ref 32.0–48.0)
pH, Arterial: 7.415 (ref 7.350–7.450)
pO2, Arterial: 91.1 mmHg (ref 83.0–108.0)

## 2017-11-28 LAB — HEMOGLOBIN A1C
HEMOGLOBIN A1C: 5.8 % — AB (ref 4.8–5.6)
MEAN PLASMA GLUCOSE: 119.76 mg/dL

## 2017-11-28 LAB — RENAL FUNCTION PANEL
Albumin: 2.7 g/dL — ABNORMAL LOW (ref 3.5–5.0)
Anion gap: 11 (ref 5–15)
BUN: 10 mg/dL (ref 6–20)
CHLORIDE: 112 mmol/L — AB (ref 98–111)
CO2: 18 mmol/L — AB (ref 22–32)
CREATININE: 0.95 mg/dL (ref 0.44–1.00)
Calcium: 7.4 mg/dL — ABNORMAL LOW (ref 8.9–10.3)
GFR calc Af Amer: >60 mL/min (ref >60)
GFR calc non Af Amer: >60 mL/min (ref >60)
Glucose, Bld: 172 mg/dL — ABNORMAL HIGH (ref 70–99)
Phosphorus: 2 mg/dL — ABNORMAL LOW (ref 2.5–4.6)
Potassium: 4.1 mmol/L (ref 3.5–5.1)
Sodium: 141 mmol/L (ref 135–145)

## 2017-11-28 LAB — CBC
HCT: 27.3 % — ABNORMAL LOW (ref 36.0–46.0)
Hemoglobin: 8.4 g/dL — ABNORMAL LOW (ref 12.0–15.0)
MCH: 24.4 pg — ABNORMAL LOW (ref 26.0–34.0)
MCHC: 30.8 g/dL (ref 30.0–36.0)
MCV: 79.4 fL (ref 78.0–100.0)
Platelets: 228 10*3/uL (ref 150–400)
RBC: 3.44 MIL/uL — ABNORMAL LOW (ref 3.87–5.11)
RDW: 18.4 % — ABNORMAL HIGH (ref 11.5–15.5)
WBC: 14 10*3/uL — ABNORMAL HIGH (ref 4.0–10.5)

## 2017-11-28 LAB — GLUCOSE, CAPILLARY
GLUCOSE-CAPILLARY: 134 mg/dL — AB (ref 70–99)
GLUCOSE-CAPILLARY: 135 mg/dL — AB (ref 70–99)
GLUCOSE-CAPILLARY: 139 mg/dL — AB (ref 70–99)
Glucose-Capillary: 147 mg/dL — ABNORMAL HIGH (ref 70–99)
Glucose-Capillary: 99 mg/dL (ref 70–99)

## 2017-11-28 LAB — LIPID PANEL
CHOL/HDL RATIO: 3.3 ratio
Cholesterol: 138 mg/dL (ref 0–200)
HDL: 42 mg/dL (ref >40)
LDL Cholesterol: 64 mg/dL (ref 0–99)
Triglycerides: 159 mg/dL — ABNORMAL HIGH (ref <150)
VLDL: 32 mg/dL (ref 0–40)

## 2017-11-28 LAB — TRIGLYCERIDES: Triglycerides: 148 mg/dL (ref <150)

## 2017-11-28 LAB — MAGNESIUM
MAGNESIUM: 1.7 mg/dL (ref 1.7–2.4)
Magnesium: 1.7 mg/dL (ref 1.7–2.4)

## 2017-11-28 LAB — SODIUM
SODIUM: 144 mmol/L (ref 135–145)
SODIUM: 147 mmol/L — AB (ref 135–145)
Sodium: 141 mmol/L (ref 135–145)
Sodium: 149 mmol/L — ABNORMAL HIGH (ref 135–145)

## 2017-11-28 LAB — HIV ANTIBODY (ROUTINE TESTING W REFLEX): HIV Screen 4th Generation wRfx: NONREACTIVE

## 2017-11-28 LAB — PHOSPHORUS: PHOSPHORUS: 1.3 mg/dL — AB (ref 2.5–4.6)

## 2017-11-28 SURGERY — CRANIOTOMY HEMATOMA EVACUATION SUBDURAL
Anesthesia: General | Site: Head | Laterality: Left

## 2017-11-28 MED ORDER — VITAL HIGH PROTEIN PO LIQD
1000.0000 mL | ORAL | Status: AC
  Administered 2017-11-28 – 2017-12-08 (×10): 1000 mL
  Filled 2017-11-28 (×3): qty 1000

## 2017-11-28 MED ORDER — PRO-STAT SUGAR FREE PO LIQD
30.0000 mL | Freq: Three times a day (TID) | ORAL | Status: AC
  Administered 2017-11-28 – 2017-12-08 (×32): 30 mL
  Filled 2017-11-28 (×32): qty 30

## 2017-11-28 MED ORDER — MANNITOL 25 % IV SOLN
30.0000 g | Freq: Once | INTRAVENOUS | Status: AC
  Administered 2017-11-28: 30 g via INTRAVENOUS

## 2017-11-28 MED ORDER — BUPIVACAINE-EPINEPHRINE 0.5% -1:200000 IJ SOLN
INTRAMUSCULAR | Status: AC
  Filled 2017-11-28: qty 1

## 2017-11-28 MED ORDER — THROMBIN 5000 UNITS EX SOLR
OROMUCOSAL | Status: DC | PRN
  Administered 2017-11-28: 5 mL via TOPICAL

## 2017-11-28 MED ORDER — LIDOCAINE-EPINEPHRINE 1 %-1:100000 IJ SOLN
INTRAMUSCULAR | Status: AC
  Filled 2017-11-28: qty 1

## 2017-11-28 MED ORDER — CEFAZOLIN SODIUM-DEXTROSE 2-3 GM-%(50ML) IV SOLR
INTRAVENOUS | Status: DC | PRN
  Administered 2017-11-28: 2 g via INTRAVENOUS

## 2017-11-28 MED ORDER — THROMBIN 20000 UNITS EX SOLR
CUTANEOUS | Status: DC | PRN
  Administered 2017-11-28: 20 mL via TOPICAL

## 2017-11-28 MED ORDER — BUPIVACAINE HCL 0.25 % IJ SOLN
INTRAMUSCULAR | Status: DC | PRN
  Administered 2017-11-28: 13 mL

## 2017-11-28 MED ORDER — MIDAZOLAM HCL 2 MG/2ML IJ SOLN
INTRAMUSCULAR | Status: AC
  Filled 2017-11-28: qty 2

## 2017-11-28 MED ORDER — BUPIVACAINE HCL (PF) 0.5 % IJ SOLN
INTRAMUSCULAR | Status: AC
  Filled 2017-11-28: qty 30

## 2017-11-28 MED ORDER — MANNITOL 25 % IV SOLN
INTRAVENOUS | Status: AC
  Administered 2017-11-28: 12.5 g
  Filled 2017-11-28: qty 100

## 2017-11-28 MED ORDER — PROPOFOL 10 MG/ML IV BOLUS
INTRAVENOUS | Status: AC
  Filled 2017-11-28: qty 20

## 2017-11-28 MED ORDER — BACITRACIN ZINC 500 UNIT/GM EX OINT
TOPICAL_OINTMENT | CUTANEOUS | Status: AC
  Filled 2017-11-28: qty 28.35

## 2017-11-28 MED ORDER — SODIUM CHLORIDE 0.9 % IV SOLN
INTRAVENOUS | Status: DC | PRN
  Administered 2017-11-28: 11:00:00 via INTRAVENOUS

## 2017-11-28 MED ORDER — HEMOSTATIC AGENTS (NO CHARGE) OPTIME
TOPICAL | Status: DC | PRN
  Administered 2017-11-28: 1 via TOPICAL

## 2017-11-28 MED ORDER — ONDANSETRON HCL 4 MG/2ML IJ SOLN
INTRAMUSCULAR | Status: DC | PRN
  Administered 2017-11-28: 4 mg via INTRAVENOUS

## 2017-11-28 MED ORDER — MANNITOL 25 % IV SOLN
INTRAVENOUS | Status: AC
  Filled 2017-11-28: qty 100

## 2017-11-28 MED ORDER — BACITRACIN ZINC 500 UNIT/GM EX OINT
TOPICAL_OINTMENT | CUTANEOUS | Status: DC | PRN
  Administered 2017-11-28: 1 via TOPICAL

## 2017-11-28 MED ORDER — THROMBIN (RECOMBINANT) 20000 UNITS EX SOLR
CUTANEOUS | Status: AC
  Filled 2017-11-28: qty 20000

## 2017-11-28 MED ORDER — THROMBIN 5000 UNITS EX SOLR
CUTANEOUS | Status: AC
  Filled 2017-11-28: qty 5000

## 2017-11-28 MED ORDER — ROCURONIUM BROMIDE 10 MG/ML (PF) SYRINGE
PREFILLED_SYRINGE | INTRAVENOUS | Status: DC | PRN
  Administered 2017-11-28: 50 mg via INTRAVENOUS

## 2017-11-28 MED ORDER — SODIUM CHLORIDE 3 % IV SOLN
INTRAVENOUS | Status: AC
  Administered 2017-11-28 (×2): 80 mL/h via INTRAVENOUS
  Administered 2017-11-28: 75 mL/h via INTRAVENOUS
  Administered 2017-11-28: 80 mL/h via INTRAVENOUS
  Administered 2017-11-28: 75 mL/h via INTRAVENOUS
  Administered 2017-11-29 – 2017-11-30 (×3): 40 mL/h via INTRAVENOUS
  Filled 2017-11-28 (×12): qty 500

## 2017-11-28 MED ORDER — FENTANYL CITRATE (PF) 250 MCG/5ML IJ SOLN
INTRAMUSCULAR | Status: AC
  Filled 2017-11-28: qty 5

## 2017-11-28 MED ORDER — ADULT MULTIVITAMIN LIQUID CH
15.0000 mL | Freq: Every day | ORAL | Status: DC
  Administered 2017-11-28 – 2018-01-08 (×42): 15 mL
  Filled 2017-11-28 (×42): qty 15

## 2017-11-28 MED ORDER — 0.9 % SODIUM CHLORIDE (POUR BTL) OPTIME
TOPICAL | Status: DC | PRN
  Administered 2017-11-28 (×3): 1000 mL

## 2017-11-28 MED ORDER — LIDOCAINE-EPINEPHRINE 1 %-1:100000 IJ SOLN
INTRAMUSCULAR | Status: DC | PRN
  Administered 2017-11-28: 13 mL

## 2017-11-28 MED ORDER — INSULIN ASPART 100 UNIT/ML ~~LOC~~ SOLN
2.0000 [IU] | SUBCUTANEOUS | Status: DC
  Administered 2017-11-28 – 2017-11-29 (×4): 2 [IU] via SUBCUTANEOUS
  Administered 2017-11-29 (×2): 4 [IU] via SUBCUTANEOUS
  Administered 2017-11-29 – 2017-11-30 (×2): 2 [IU] via SUBCUTANEOUS
  Administered 2017-11-30: 4 [IU] via SUBCUTANEOUS
  Administered 2017-11-30 – 2017-12-01 (×4): 2 [IU] via SUBCUTANEOUS

## 2017-11-28 MED FILL — Thrombin (Recombinant) For Soln 20000 Unit: CUTANEOUS | Qty: 1 | Status: AC

## 2017-11-28 SURGICAL SUPPLY — 78 items
BASKET BONE COLLECTION (BASKET) IMPLANT
BATTERY IQ STERILE (MISCELLANEOUS) ×2 IMPLANT
BIT DRILL WIRE PASS 1.3MM (BIT) IMPLANT
BNDG GAUZE ELAST 4 BULKY (GAUZE/BANDAGES/DRESSINGS) ×2 IMPLANT
BNDG STRETCH 4X75 STRL LF (GAUZE/BANDAGES/DRESSINGS) ×2 IMPLANT
BUR ACORN 6.0 PRECISION (BURR) ×2 IMPLANT
BUR SPIRAL ROUTER 2.3 (BUR) ×2 IMPLANT
CANISTER SUCT 3000ML PPV (MISCELLANEOUS) ×2 IMPLANT
CARTRIDGE OIL MAESTRO DRILL (MISCELLANEOUS) ×1 IMPLANT
CATH ROBINSON RED A/P 12FR (CATHETERS) IMPLANT
CLIP VESOCCLUDE MED 6/CT (CLIP) IMPLANT
DECANTER SPIKE VIAL GLASS SM (MISCELLANEOUS) ×2 IMPLANT
DIFFUSER DRILL AIR PNEUMATIC (MISCELLANEOUS) ×2 IMPLANT
DRAIN PENROSE 1/2X12 LTX STRL (WOUND CARE) IMPLANT
DRAPE NEUROLOGICAL W/INCISE (DRAPES) ×2 IMPLANT
DRAPE WARM FLUID 44X44 (DRAPE) ×2 IMPLANT
DRILL WIRE PASS 1.3MM (BIT)
DRSG OPSITE POSTOP 4X6 (GAUZE/BANDAGES/DRESSINGS) ×4 IMPLANT
DRSG PAD ABDOMINAL 8X10 ST (GAUZE/BANDAGES/DRESSINGS) IMPLANT
DRSG TELFA 3X8 NADH (GAUZE/BANDAGES/DRESSINGS) ×2 IMPLANT
DURAGUARD 06CMX08CM ×6 IMPLANT
DURAPREP 26ML APPLICATOR (WOUND CARE) ×4 IMPLANT
DURAPREP 6ML APPLICATOR 50/CS (WOUND CARE) IMPLANT
ELECT REM PT RETURN 9FT ADLT (ELECTROSURGICAL) ×2
ELECTRODE REM PT RTRN 9FT ADLT (ELECTROSURGICAL) ×1 IMPLANT
EVACUATOR SILICONE 100CC (DRAIN) IMPLANT
GAUZE 4X4 16PLY RFD (DISPOSABLE) IMPLANT
GAUZE SPONGE 4X4 12PLY STRL (GAUZE/BANDAGES/DRESSINGS) ×2 IMPLANT
GLOVE BIO SURGEON STRL SZ7.5 (GLOVE) ×6 IMPLANT
GLOVE BIO SURGEON STRL SZ8 (GLOVE) ×2 IMPLANT
GLOVE BIOGEL PI IND STRL 6.5 (GLOVE) ×2 IMPLANT
GLOVE BIOGEL PI IND STRL 7.0 (GLOVE) ×1 IMPLANT
GLOVE BIOGEL PI IND STRL 7.5 (GLOVE) ×1 IMPLANT
GLOVE BIOGEL PI IND STRL 8 (GLOVE) ×1 IMPLANT
GLOVE BIOGEL PI IND STRL 8.5 (GLOVE) ×1 IMPLANT
GLOVE BIOGEL PI INDICATOR 6.5 (GLOVE) ×2
GLOVE BIOGEL PI INDICATOR 7.0 (GLOVE) ×1
GLOVE BIOGEL PI INDICATOR 7.5 (GLOVE) ×1
GLOVE BIOGEL PI INDICATOR 8 (GLOVE) ×1
GLOVE BIOGEL PI INDICATOR 8.5 (GLOVE) ×1
GLOVE ECLIPSE 8.0 STRL XLNG CF (GLOVE) ×2 IMPLANT
GLOVE EXAM NITRILE LRG STRL (GLOVE) IMPLANT
GLOVE EXAM NITRILE XL STR (GLOVE) IMPLANT
GLOVE EXAM NITRILE XS STR PU (GLOVE) IMPLANT
GLOVE SURG SS PI 6.5 STRL IVOR (GLOVE) ×8 IMPLANT
GOWN STRL REUS W/ TWL LRG LVL3 (GOWN DISPOSABLE) ×2 IMPLANT
GOWN STRL REUS W/ TWL XL LVL3 (GOWN DISPOSABLE) ×2 IMPLANT
GOWN STRL REUS W/TWL 2XL LVL3 (GOWN DISPOSABLE) ×2 IMPLANT
GOWN STRL REUS W/TWL LRG LVL3 (GOWN DISPOSABLE) ×2
GOWN STRL REUS W/TWL XL LVL3 (GOWN DISPOSABLE) ×2
HEMOSTAT POWDER KIT SURGIFOAM (HEMOSTASIS) ×2 IMPLANT
HEMOSTAT SURGICEL 2X14 (HEMOSTASIS) ×2 IMPLANT
KIT BASIN OR (CUSTOM PROCEDURE TRAY) ×2 IMPLANT
KIT TURNOVER KIT B (KITS) ×2 IMPLANT
NEEDLE HYPO 25X1 1.5 SAFETY (NEEDLE) ×2 IMPLANT
NS IRRIG 1000ML POUR BTL (IV SOLUTION) ×6 IMPLANT
OIL CARTRIDGE MAESTRO DRILL (MISCELLANEOUS) ×2
PACK CRANIOTOMY CUSTOM (CUSTOM PROCEDURE TRAY) ×2 IMPLANT
PAD ARMBOARD 7.5X6 YLW CONV (MISCELLANEOUS) ×6 IMPLANT
PATTIES SURGICAL .5 X.5 (GAUZE/BANDAGES/DRESSINGS) IMPLANT
PATTIES SURGICAL .5 X3 (DISPOSABLE) IMPLANT
PATTIES SURGICAL 1X1 (DISPOSABLE) IMPLANT
PIN MAYFIELD SKULL DISP (PIN) IMPLANT
SLEEVE SURGEON STRL (DRAPES) ×2 IMPLANT
SPECIMEN JAR SMALL (MISCELLANEOUS) ×2 IMPLANT
SPONGE NEURO XRAY DETECT 1X3 (DISPOSABLE) IMPLANT
SPONGE SURGIFOAM ABS GEL 100 (HEMOSTASIS) ×2 IMPLANT
STAPLER SKIN PROX WIDE 3.9 (STAPLE) ×6 IMPLANT
SUT ETHILON 3 0 PS 1 (SUTURE) IMPLANT
SUT NURALON 4 0 TR CR/8 (SUTURE) ×2 IMPLANT
SUT VIC AB 2-0 CP2 18 (SUTURE) ×12 IMPLANT
SUT VIC AB 3-0 SH 8-18 (SUTURE) ×8 IMPLANT
SYR CONTROL 10ML LL (SYRINGE) IMPLANT
TOWEL GREEN STERILE (TOWEL DISPOSABLE) ×2 IMPLANT
TOWEL GREEN STERILE FF (TOWEL DISPOSABLE) ×2 IMPLANT
TRAY FOLEY MTR SLVR 16FR STAT (SET/KITS/TRAYS/PACK) IMPLANT
UNDERPAD 30X30 (UNDERPADS AND DIAPERS) IMPLANT
WATER STERILE IRR 1000ML POUR (IV SOLUTION) ×2 IMPLANT

## 2017-11-28 NOTE — Progress Notes (Signed)
Transported patient to MRI while on the ventilator. Patient remained stable during procedure and transport.

## 2017-11-28 NOTE — Progress Notes (Signed)
Neurosurgery Progress Note  No significant change  EXAM:  BP 129/75   Pulse 83   Temp 100.1 F (37.8 C) (Axillary)   Resp (!) 28   Ht 5\' 1"  (1.549 m)   Wt 77.2 kg   SpO2 98%   BMI 32.16 kg/m   Craniotomy site: dried blood on tegaderm but no active drainage.   IMPRESSION/PLAN 42 y.o. female POD #1 craniotomy for evacuation of hematoma. Neurologically unchanged. Wound looks good.  - Continue management per Neuro and PCCM

## 2017-11-28 NOTE — Transfer of Care (Signed)
Immediate Anesthesia Transfer of Care Note  Patient: Urban GibsonKelly L Pisarski  Procedure(s) Performed: Decompressive craniectomy (Left Head)  Patient Location: NICU  Anesthesia Type:General  Level of Consciousness: Patient remains intubated per anesthesia plan  Airway & Oxygen Therapy: Patient remains intubated per anesthesia plan  Post-op Assessment: Report given to RN and Post -op Vital signs reviewed and stable  Post vital signs: Reviewed and stable  Last Vitals:  Vitals Value Taken Time  BP 107/70 11/28/2017 12:45 PM  Temp 36.9 C 11/28/2017 12:45 PM  Pulse 86 11/28/2017 12:48 PM  Resp 17 11/28/2017 12:48 PM  SpO2 97 % 11/28/2017 12:48 PM  Vitals shown include unvalidated device data.  Last Pain:  Vitals:   11/28/17 1245  TempSrc: Oral         Complications: No apparent anesthesia complications

## 2017-11-28 NOTE — Progress Notes (Signed)
Initial Nutrition Assessment  DOCUMENTATION CODES:   Obesity unspecified  INTERVENTION:   Once pt out of surgery initiate enteral nutrition therapy  Vital High Protein @ 30 ml/hr (720 ml/day) 30 ml Prostat TID MVI daily  Provides: 1020 kcal, 108 grams protein, and 630 ml free water.   Once out of surgery will need to adjust TF based on need for propofol and cleviprex.    NUTRITION DIAGNOSIS:   Inadequate oral intake related to inability to eat as evidenced by NPO status.  GOAL:   Provide needs based on ASPEN/SCCM guidelines  MONITOR:   TF tolerance, Vent status  REASON FOR ASSESSMENT:   Consult, Ventilator Enteral/tube feeding initiation and management  ASSESSMENT:   Pt with PMH of HTN with prescribed stimulant use admitted with large L hemorrhagic basal ganglia with 15 mm shift with early herniation s/p L frontotemporal craniotomy for evacuation of hematoma.    Pt discussed during ICU rounds and with RN.  Pt off unit for emergent craniectomy.  9/19 OG placed, per xray tip at GE junction, radiologist recommends OG tube be adjusted before use. - Per RN tube has been adjusted.   Patient is currently intubated on ventilator support MV: 10.8 L/min Temp (24hrs), Avg:100.2 F (37.9 C), Min:98.2 F (36.8 C), Max:100.6 F (38.1 C)  Propofol: 4.63 ml/hr provides: 122 kcal  Cleviprex: 14 ml/hr provides: 672 kcal   Medications reviewed and include: folic acid 1 mg daily, 2-6 units every 4 hours, senokot-s BID, 100 mg thiamine daily 3% hypertonic saline @ 80 ml/hr Labs reviewed: TG: 159 (H) BP: 168/73 MAP: 98   I/O: +3153 ml since admit UOP: 1635 ml x 24 hrs    NUTRITION - FOCUSED PHYSICAL EXAM:   Unable to complete Nutrition-Focused physical exam at this time.    Diet Order:   Diet Order            Diet NPO time specified  Diet effective now              EDUCATION NEEDS:   No education needs have been identified at this time  Skin:  Skin  Assessment: Reviewed RN Assessment(L head incision)  Last BM:  unknown  Height:   Ht Readings from Last 1 Encounters:  11/27/17 5\' 1"  (1.549 m)    Weight:   Wt Readings from Last 1 Encounters:  11/27/17 77.2 kg    Ideal Body Weight:  47.7 kg  BMI:  Body mass index is 32.16 kg/m.  Estimated Nutritional Needs:   Kcal:  [phone]  Protein:  > 95 grams   Fluid:  > 1.5 L  Kendell BaneHeather Jackie Littlejohn RD, LDN, CNSC [phone] Pager [phone] After Hours Pager

## 2017-11-28 NOTE — Progress Notes (Signed)
Sedation turned all the way off to fully reassess neurological status, pt still not opening eyes to pain or voice and only moves L arm and L leg non-purposefully to pain.  Will turn sedation back on to minimal levels d/t pt becoming tachypneic and double stacking the vent on no sedation.

## 2017-11-28 NOTE — Progress Notes (Signed)
Pt taken off the ventilator to go to OR with CRNA  manually ventilating

## 2017-11-28 NOTE — Progress Notes (Signed)
OT Cancellation Note  Patient Details Name: Tammie Miles MRN: [id number] DOB: [birth year]   Cancelled Treatment:    Reason Eval/Treat Not Completed: Active bedrest order  [facility]  Kimberly Coye, OT/L   Acute OT Clinical Specialist Acute Rehabilitation Services Pager [phone] Office [phone]  11/28/2017, 9:36 AM

## 2017-11-28 NOTE — Plan of Care (Signed)
  Problem: Education: Goal: Knowledge of disease or condition will improve Outcome: Not Progressing Note:  Patient intubated and sedated  Goal: Knowledge of secondary prevention will improve Outcome: Not Progressing Goal: Knowledge of patient specific risk factors addressed and post discharge goals established will improve Outcome: Not Progressing   Problem: Activity: Goal: Ability to tolerate increased activity will improve Outcome: Not Progressing   Problem: Respiratory: Goal: Ability to maintain a clear airway and adequate ventilation will improve Outcome: Progressing Note:  Patient is breathing well through the ventilato.

## 2017-11-28 NOTE — Progress Notes (Addendum)
NAME:  Tammie Miles, MRN:  [id number], DOB:  1975/06/09, LOS: 1 ADMISSION DATE:  11/27/2017, CONSULTATION DATE:  11/27/2017 REFERRING MD:  Dr. Conchita Paris CHIEF COMPLAINT:  Left frontal hemorrhage  Brief History   42 yoF with AMS since 9/18 evening, thought due to intoxication. Still unresponsive this am.  Patient was brought to the ED where a CT was done and showed a large left sided ICH.  Patient was taken to the OR and a mini craniectomy was done.  Patient was brought out to the ICU and PCCM was consulted for vent management.    Significant Hospital Events   9/19 Admit/ OR  Consults: date of consult/date signed off & final recs:  9/19 NSGY 9/19 PCCM   Procedures (surgical and bedside):  9/19 Intubated 9/19 OR for crani, evac of hematoma>> no change in neuro status post op  Significant Diagnostic Tests:  9/19 [facility]  Large area of hemorrhage in the left basal ganglia and left frontal lobe, 5.8 x 3.5 cm. 15 mm of left-to-right midline shift.  9/19 CTA head 1. Positive for left MCA M1 large vessel occlusion, and also severe stenosis at the left ICA terminus, superimposed on the relatively large acute left hemisphere intra-axial hemorrhage (estimated blood volume 43 mL). Vasospasm suspected in the proximal ACAs. 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 2. Negative for intracranial aneurysm, CTA spot sign, or evidence of vascular malformation in association with the acute hemorrhage. 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 3. Stable intracranial mass effect since 0938 hours today, with 16 mm of leftward midline shift and trapping of the right lateral ventricle.  Micro Data: 9/19 MRSA PCR >> 9/19 Sputum Culture>> GS Rare yeast, Rare G+ rods  Antimicrobials:  9/19 Cefazolin (pre-op)  Subjective:   42 yoF with AMS since 9/18 evening, thought due to intoxication. Still unresponsive this am. and leg to pain only. WUA no eye opening to pain or voice,tachypneic and double stacking respirations. Sedation resumed at low doses  Objective   Blood pressure 135/75, pulse 80, temperature 99.9 F (37.7 C), temperature source Axillary, resp. rate (!) 26, height 5\' 1"  (1.549 m), weight 77.2 kg, SpO2 99 %.    Vent Mode: PRVC FiO2 (%):  [40 %-100 %] 40 % Set Rate:  [16 bmp] 16 bmp Vt Set:  [380 mL-500 mL] 380 mL PEEP:  [5 cmH20] 5 cmH20 Plateau Pressure:  [10 cmH20-17 cmH20] 10 cmH20   Intake/Output Summary (Last 24 hours) at 11/28/2017 0823 Last data filed at 11/28/2017 0810 Gross per 24 hour  Intake 5338.15 ml  Output 1785 ml  Net 3553.15 ml   Filed Weights   11/27/17 1030 11/27/17 1400  Weight: 80 kg 77.2 kg    Examination: General: Acutely ill appearing female, sedated and intubated on full vent support HENT: [place]/AT, PERRL, EOM-I and MMM, OG tube. ETT secure and Intact Lungs: Bilateral chest excursion, Clear throughout, diminished per bases. Cardiovascular: S1, S2, RRR, NO RMG, NSR per monitor Abdomen: Soft, NT, ND and +BS. Body mass index is 32.16 kg/m. Extremities: No edema, No obvious deformities noted Neuro: Sedated and paralyzed, moves L upper and lower  extremity to pain, does not open eyes to voice or pain Skin: Warm and dry, intact, L crani incision CDI  Resolved Hospital Problem list    Assessment & Plan:  Acute encephalopathy related to Large Left frontal IPH - S/p OR for  craniotomy 9/19>> repeat OR 9/20 for crani >> swelling - Remains on 3% saline -  P:  Neurology primary Postop per NSGY Strict blood pressure control SBP < 140 with cleviprex  Hourly neuro checks Sodium checks per protocol   Hypertensive crisis, hx HTN + 3 L  P:  cleviprex for SBP < 140 Maintain art line Check triglycerides  Acute respiratory insufficiency in the setting of massive IPH - CXR reviewed 9/20 - Stable right basilar atelectasis. -  No weaning>> dyssynchronous with vent when sedation off - Overbreathing vent  P : Full MV support 8 cc/kg Wean FiO2/ Peep for sats > 94% Hold SBT Continue to trend ABG's Continue sedation VAP measures  PAD protocol with   ID T Max 100.8 Leukocytosis Plan: Trend fever curve and WBC Follow Micro CXR prn Culture as clinically indicated   Renal: Hypokalemia>> resolved Remains on 3% saline>> Na is 141 P : Trend BMET Serum NA Q 6 Replete electrolytes as needed Trend UO  Anemia HGB drop to 9.3 post op now 8.4 Positive 3L last 24>> may be hemodilutional component No obvious bleeding Plan: Trend CBC Transfuse fof HGB < 7 Monitor for bleeding Consider imaging head  GI  On  propofol and Claviprex NPO Plan: Advance OG per radiology advice Dietary consult for TF CBG's Q 4 Add SSI coverage Check triglycerides SUP Pepcid   Possible ETOH abuse - unclear usage P:  empiric thiamine/ folate  Hx ADHD/ depression / anxiety P:  Will address as mental status improves   Disposition / Summary of Today's Plan 11/28/17   ICU  To remain on MV and further stabilization of BP and improvement of mental status.  Emergent repeat OR 9/20 for Left MCA territory infarct with evacuated hematoma. Midline shift measures 8 mm.    Diet: NPO Pain/Anxiety/Delirium protocol (if indicated): Propofol and fentanyl VAP protocol (if indicated) indicated DVT prophylaxis: SCDs only  GI prophylaxis: pepcid  Hyperglycemia protocol: CBG q 4; start SSI if CBG > 180 Mobility: BR Code Status: full Family Communication: Family updated bedside  Labs   CBC: Recent Labs  Lab 11/27/17 0933 11/27/17 0948 11/27/17 1202 11/27/17 1834  WBC 16.4*  --   --   --   HGB 11.8* 13.6 10.9* 9.3*  HCT 38.7 40.0 32.0* 30.6*  MCV 77.9*  --   --   --   PLT 335  --   --   --    Basic Metabolic Panel: Recent Labs  Lab 11/27/17 0933 11/27/17 0948 11/27/17 1202 11/27/17 1552 11/28/17 0257  11/28/17 0445  NA 140 138 138  --  141 141  K 3.0* 2.9* 2.9*  --   --  4.1  CL 98 100  --   --   --  112*  CO2 25  --   --   --   --  18*  GLUCOSE 144* 141*  --   --   --  172*  BUN 11 12  --   --   --  10  CREATININE 1.04* 0.80  --   --   --  0.95  CALCIUM 9.2  --   --   --   --  7.4*  MG  --   --   --  1.9  --   --   PHOS  --   --   --   --   --  2.0*   GFR: Estimated Creatinine Clearance: 72.6 mL/min (by C-G formula based on SCr  of 0.95 mg/dL). Recent Labs  Lab 11/27/17 0933 11/27/17 0949 11/27/17 1552  PROCALCITON  --   --  <0.10  WBC 16.4*  --   --   LATICACIDVEN  --  4.07* 4.3*   Liver Function Tests: Recent Labs  Lab 11/27/17 0933 11/28/17 0445  AST 24  --   ALT 15  --   ALKPHOS 83  --   BILITOT 1.2  --   PROT 7.6  --   ALBUMIN 3.8 2.7*   No results for input(s): LIPASE, AMYLASE in the last 168 hours. No results for input(s): AMMONIA in the last 168 hours. ABG    Component Value Date/Time   PHART 7.415 11/28/2017 0333   PCO2ART 33.3 11/28/2017 0333   PO2ART 91.1 11/28/2017 0333   HCO3 20.9 11/28/2017 0333   TCO2 25 11/27/2017 1519   ACIDBASEDEF 2.9 (H) 11/28/2017 0333   O2SAT 97.4 11/28/2017 0333    Coagulation Profile: Recent Labs  Lab 11/27/17 0933  INR 1.06   Cardiac Enzymes: No results for input(s): CKTOTAL, CKMB, CKMBINDEX, TROPONINI in the last 168 hours. HbA1C: No results found for: HGBA1C CBG: Recent Labs  Lab 11/27/17 0933 11/27/17 1555 11/27/17 1947 11/28/17 0346 11/28/17 0723  GLUCAP 120* 82 93 147* 139*    Admitting History of Present Illness.   HPI obtained from medical chart review as patient is intubated and sedated on MV.   42 year old female with PMH of HTN, depression, anxiety, and ADHD who presented from home with altered mental status.  Patient lives with her 58 year old daughter and was apparently unable to be awakened yesterday evening.  Daughter called patient's boyfriend who thought she was possibly intoxicated.   This morning, patient still could not be awakened and therefore EMS was called by boyfriend.  On arrival to ER, she was nonverbal and minimally responsive to pain and only moving left side with decerebrate posturing on right and hypertensive requiring labetalol and cleviprex for blood pressure control.  CT head shows large intraparenchymal bleed with midline shift to left.  CTA without evidence of aneurysm or AVM.  She was intubated for airway protection.  Mannitol given.  Neurosurgery consulted and patient taken to OR for craniotomy.  PCCM consulted for ventilator and medical management.   Review of Systems:   Unable to complete as patient is sedated on mechanical ventilation .   Past medical history  She,  has a past medical history of ADHD, Anxiety, Depression, and Hypertension.     Surgical History    9/19>> craniotomy evacuation of clot  Social History   Social History   Socioeconomic History  . Marital status: Married    Spouse name: Not on file  . Number of children: 1  . Years of education: Not on file  . Highest education level: Not on file  Occupational History  . Not on file  Social Needs  . Financial resource strain: Not on file  . Food insecurity:    Worry: Not on file    Inability: Not on file  . Transportation needs:    Medical: Not on file    Non-medical: Not on file  Tobacco Use  . Smoking status: Not on file  Substance and Sexual Activity  . Alcohol use: Yes  . Drug use: Not on file  . Sexual activity: Yes  Lifestyle  . Physical activity:    Days per week: Not on file    Minutes per session: Not on file  .  Stress: Not on file  Relationships  . Social connections:    Talks on phone: Not on file    Gets together: Not on file    Attends religious service: Not on file    Active member of club or organization: Not on file    Attends meetings of clubs or organizations: Not on file    Relationship status: Not on file  . Intimate partner violence:    Fear  of current or ex partner: Not on file    Emotionally abused: Not on file    Physically abused: Not on file    Forced sexual activity: Not on file  Other Topics Concern  . Not on file  Social History Narrative  . Not on file  ,  reports that she drinks alcohol.   Family history   Her family history includes Hypertension in her father and mother.   Allergies No Known Allergies  Home meds  Prior to Admission medications   Not on File       Bevelyn Ngo, AGACNP-BC [facility] Pulmonary/Critical Care Medicine. 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  Patient had a mini-craniotomy and on CT this AM that I reviewed myself increased edema was noted.  On exam, remains unresponsive with clear lungs.  Discussed with PCCM-NP and neurology-MD.  Will place TLC for 3% saline at neurology's request.  Will send back to the OR for craniectomy.  Maintain on sedation as needed.  BP control via cliveprex per neurology's recommendations.  Adjust vent for ABG.  Discussed with PCCM-NP.  The patient is critically ill with multiple organ systems failure and requires high complexity decision making for assessment and support, frequent evaluation and titration of therapies, application of advanced monitoring technologies and extensive interpretation of multiple databases.   Critical Care Time devoted to patient care services described in this note is  36  Minutes. This time reflects time of care of this signee Dr Koren Bound. This critical care time does not reflect procedure time, or teaching time or supervisory time of PA/NP/Med student/Med Resident etc but could involve care discussion time.  Alyson Reedy, M.D. [facility] Pulmonary/Critical Care Medicine. Pager: [phone]. After hours pager: [phone].

## 2017-11-28 NOTE — Op Note (Signed)
11/28/2017  12:49 PM  PATIENT:  Tammie Miles  42 y.o. female  PRE-OPERATIVE DIAGNOSIS:  Stroke with ICH and midline shift due to elevated ICP  POST-OPERATIVE DIAGNOSIS:  Stroke with ICH and midline shift due to elevated ICP  PROCEDURE:  Procedure(s): Decompressive craniectomy (Left) with skull placed in abdomen  SURGEON:  Surgeon(s) and Role:    * Mateya Torti, MD - Primary  PHYSICIAN ASSISTANT:   ASSISTANTS: Poteat, RN   ANESTHESIA:   general  EBL:  100 mL   BLOOD ADMINISTERED:none  DRAINS: none   LOCAL MEDICATIONS USED:  MARCAINE    and LIDOCAINE   SPECIMEN:  No Specimen  DISPOSITION OF SPECIMEN:  N/A  COUNTS:  YES  TOURNIQUET:  * No tourniquets in log *  DICTATION: Patient is 42 year old woman who had a craniotomy yesterday for ICH, which was felt to be bleeding into a dominant hemisphere stroke.  She is comatose with persistent left sided brain edema and shift.  Per Neurology, it was recommended for patient to undergo a decompressive craniectomy.  It was therefore elected to take her to surgery for decompressive craniectomy.  Procedure:  Patient was intubated prior to arrival in OR, patient was placed in semi-lateral position with blanket roll.  Head was placed on donut head holder and left fronto-temporo-parietal scalp was shaved and prepped and draped in usual sterile fashion. Staples were removed from previous incision and T'ed posteriorly.  Area of planned incision was infiltrated with lidocaine. The prior bone flap was removed and the craniotomy defect was extended and a much bigger bone flap was elevated.  A large craniotomy flap was elevated. Skull flap was elevated exposing some small amount of epidural hematoma.  Dura was opened extensively. Hemostasis was assured.  The dura was placed back over the brain and 4 large sheets of Bovine pericardium were placed over the brain.The brain bulged out of craniotomy defect, but there did not appear to be uncontrollable  swelling.  The galea was closed with 2-0 vicryl stitches and the skin was re approximated with staples. Bone flap was then placed in a separate incision in the subcutaneous tissues of the right side of the abdomen and this wound was closed in a similar fashion.  Sterile occlusive dressings were placed. A sterile occlusive head wrap was placed and patient was taken to Neuro-ICU for post-operative management.    PLAN OF CARE: Admit to inpatient   PATIENT DISPOSITION:  PACU - hemodynamically stable.   Delay start of Pharmacological VTE agent (>24hrs) due to surgical blood loss or risk of bleeding: yes  

## 2017-11-28 NOTE — Progress Notes (Signed)
SLP Cancellation Note  Patient Details Name: Tammie Miles MRN: [id number] DOB: [birth year]   Cancelled treatment:       Reason Eval/Treat Not Completed: Patient not medically ready. Intubated, will follow for readiness   Shakenna Herrero, Riley NearingBonnie Caroline 11/28/2017, 7:35 AM

## 2017-11-28 NOTE — Progress Notes (Signed)
No vent check completed at this time as Pt is in OR currently

## 2017-11-28 NOTE — Progress Notes (Signed)
transported to and from CT uneventful trip. No distress or complications noted.

## 2017-11-28 NOTE — Progress Notes (Addendum)
NEUROHOSPITALISTS STROKE TEAM - DAILY PROGRESS NOTE    HISTORY Tammie Miles is a 42 y.o. Female with PMH of HTN  On hypertensive medications, antidepressants, stimulants, and Xanax which are all prescribed by her primary physician.  Per her daughter's  father,  she was last seen normal at approximately 1320 on 11/27/2017.  Apparently when she arrived home patient went up and went to sleep.  Upon waking she was not acting correctly and was dropping things thus the 53 year old daughter called the father multiple times saying the mom was not acting correctly.  He went to check on her and thought possibly this could be that she was drunk and she was obtunded but moving her arms.  different and obtunded. This morning she remained obtunded.  He then immediately called 911.  Patient was brought to the emergency department as a code stroke.  Per husband there is no use of cocaine or stimulants other than what is prescribed that he knows of  ED course-patient was immediately brought to the CT scan where a large area of hemorrhagic left basal ganglia and left frontal lobe was noted-5.8 x 3.5 cm there was also a 15 mm shift from left to right with early herniation. Patient received mannitol and  Neurosurgery was immediately called for decompression.   LKW: 3016 on 11/26/2017 tpa given?: no, parenchymal bleed Premorbid modified Rankin scale (mRS): 0 NIH stroke scale was 27 ICH Score: 3  SUBJECTIVE Patient currently is intubated and sedated in the ICU.  She is status post craniotomy for evacuation of hematoma.  Patient not responsive to harsh stimuli, no corneal reflex noted.  Patient without  family at bedside.  Patient's boyfriend's mother currently at bedside and able to provide some information patient's relatives.  On assessment patient was noted to have no improvement in neurological status status post craniotomy with hematoma evacuation, patient with  persistent left-sided brain edema and  8 mm shift, on MRI scan yesterday as well as follow-up CT scan this morning it was recommended that she undergo decompressive craniectomy.  This was discussed with patient boyfriend(father of her child),over the phone who gave verbal consent which was witnessed by the nurse  OBJECTIVE Most recent Vital Signs: Vitals:   11/28/17 1000 11/28/17 1015 11/28/17 1030 11/28/17 1245  BP: 139/80 (!) 142/85 138/79 107/70  Pulse: 81 89 75 84  Resp: (!) 25 (!) 27 (!) 22 16  Temp:    98.5 F (36.9 C)  TempSrc:    Oral  SpO2: 98% 98% 100% 100%  Weight:      Height:       CBG (last 3)  Recent Labs    11/27/17 1947 11/28/17 0346 11/28/17 0723  GLUCAP 93 147* 139*    Physical Exam  HEENT-status post craniotomy with dressing clean dry and intact  cardiovascular- S1-S2 audible, pulses palpable throughout   Lungs-and breath sounds Abdomen-hypoactive bowel sounds  musculoskeletal-no joint tenderness, deformity or swelling Skin-warm and dry,  Neuro:  Mental Status: Patient is sedated and intubated with minimal response to stimulus Cranial Nerves: Ocular, pinpoint and fixed pupils, no corneal reflex no cough and minimal gag Motor:  Minimum response to sternal rub with  partial flexion of the left upper and lower extremity. No response in the right upper and lower extremity. Tone and bulk:normal tone throughout; no atrophy noted Sensory: Minimally withdraws to noxious stimuli in the left arm and left leg as well as the right leg Cerebellar and Gait: unable to assess   IV Fluid Intake:   . sodium chloride 125 mL/hr at 11/28/17 1000  . sodium chloride Stopped (11/28/17 0941)  . clevidipine 7 mg/hr (11/28/17 1045)  . famotidine (PEPCID) IV Stopped (11/28/17 0844)  . fentaNYL infusion INTRAVENOUS 100 mcg/hr (11/28/17 1045)  . levETIRAcetam 500 mg (11/28/17 1017)  . propofol (DIPRIVAN) infusion 50 mcg/kg/min (11/28/17 1045)  . sodium chloride (hypertonic)  80 mL/hr (11/28/17 1045)    MEDICATIONS  .  stroke: mapping our early stages of recovery book   Does not apply Once  . chlorhexidine gluconate (MEDLINE KIT)  15 mL Mouth Rinse BID  . feeding supplement (PRO-STAT SUGAR FREE 64)  30 mL Per Tube TID  . feeding supplement (VITAL HIGH PROTEIN)  1,000 mL Per Tube Q24H  . folic acid  1 mg Intravenous Daily  . insulin aspart  2-6 Units Subcutaneous Q4H  . mouth rinse  15 mL Mouth Rinse 10 times per day  . multivitamin  15 mL Per Tube Daily  . senna-docusate  1 tablet Oral BID  . thiamine  100 mg Intravenous Daily   PRN:  sodium chloride, acetaminophen **OR** acetaminophen (TYLENOL) oral liquid 160 mg/5 mL **OR** acetaminophen, bisacodyl, docusate, labetalol  Diet:   Diet Order            Diet NPO time specified  Diet effective now               CLINICALLY SIGNIFICANT STUDIES Basic Metabolic Panel:  Recent Labs  Lab 11/28/17 0445 11/28/17 0832 11/28/17 0852 11/28/17 1324  NA 141 144 144 147*  K 4.1 3.7  --   --   CL 112* 116*  --   --   CO2 18* 19*  --   --   GLUCOSE 172* 150*  --   --   BUN 10 8  --   --   CREATININE 0.95 0.88  --   --   CALCIUM 7.4* 7.5*  --   --   MG  --  1.7 1.7  --   PHOS 2.0* 1.3*  --   --    Liver Function Tests:  Recent Labs  Lab 11/27/17 0933 11/28/17 0445  AST 24  --   ALT 15  --   ALKPHOS 83  --   BILITOT 1.2  --   PROT 7.6  --   ALBUMIN 3.8 2.7*   CBC:  Recent Labs  Lab 11/27/17 0933  11/27/17 1834 11/28/17 0832  WBC 16.4*  --   --  14.0*  HGB 11.8*   < > 9.3* 8.4*  HCT 38.7   < > 30.6* 27.3*  MCV 77.9*  --   --  79.4  PLT 335  --   --  228   < > = values in this interval not displayed.   Coagulation:  Recent Labs  Lab 11/27/17 0933  LABPROT 13.7  INR 1.06   Cardiac Enzymes: No results for input(s): CKTOTAL, CKMB, CKMBINDEX, TROPONINI in the last 168 hours. Urinalysis:  Recent Labs  Lab 11/27/17 [place] 1.035*  PHURINE 5.0  [place]  NEGATIVE  NITRITE NEGATIVE  LEUKOCYTESUR NEGATIVE   Lipid Panel    Component Value Date/Time   CHOL 138 11/28/2017 0832   TRIG 148 11/28/2017 0958   HDL 42 11/28/2017 0832   CHOLHDL 3.3 11/28/2017 0832   VLDL 32 11/28/2017 0832   LDLCALC 64 11/28/2017 0832   HgbA1C  Lab Results  Component Value Date   HGBA1C 5.8 (H) 11/28/2017    Urine Drug Screen:      Component Value Date/Time   LABOPIA NONE DETECTED 11/27/2017 1415   COCAINSCRNUR NONE DETECTED 11/27/2017 1415   LABBENZ NONE DETECTED 11/27/2017 1415   AMPHETMU POSITIVE (A) 11/27/2017 1415   THCU NONE DETECTED 11/27/2017 1415   LABBARB NONE DETECTED 11/27/2017 1415    Alcohol Level:  Recent Labs  Lab 11/27/17 0948  ETH <10    Ct Angio Head W Or Wo Contrast  IMPRESSION: 1. Positive for left MCA M1 large vessel occlusion, and also severe stenosis at the left ICA terminus, superimposed on the relatively large acute left hemisphere intra-axial hemorrhage (estimated blood volume 43 mL). Vasospasm suspected in the proximal ACAs. 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 2. Negative for intracranial aneurysm, CTA spot sign, or evidence of vascular malformation in association with the acute hemorrhage. 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 3. Stable intracranial mass effect since 0938 hours today, with 16 mm of leftward midline shift and trapping of the right lateral ventricle.   Ct Head Wo Contrast  11/28/2017 IMPRESSION: 1. Stable when compared to yesterday. 2. Left MCA territory infarct with evacuated hematoma. Midline shift measures 8 mm. 3. 2-3 mm subdural hematoma along the right cerebral convexity without interval increase.   Ct Head Wo Contrast   11/27/2017 IMPRESSION: 1. Interval LEFT frontal craniotomy for basal ganglia hematoma evacuation, small amount of residual blood products and regional edema. 7 mm residual LEFT to RIGHT midline shift, resolved RIGHT ventricular entrapment. 2. Trace RIGHT holo hemispheric and tentorial subdural hematomas. Global edema.   Ct Head Wo Contrast 11/27/2017 IMPRESSION: Large area of hemorrhage in the left basal ganglia and left frontal lobe, 5.8 x 3.5 cm. 15 mm of left-to-right midline shift.  Mr Brain Wo Contrast 11/28/2017 IMPRESSION: 1. Postoperative changes from previous left frontal craniotomy for evacuation of left basal ganglia hematoma. Residual blood products within the evacuation cavity with persistent surrounding vasogenic edema and regional mass effect. Associated 8-9 mm of right-to-left midline shift with mild left uncal herniation similar to previous. No hydrocephalus or ventricular trapping. 2. Underlying evolving large acute ischemic left MCA territory infarct involving the majority of the left MCA territory. 3. Trace right cerebral and tentorial subdural hematoma without mass effect. 4. Right sphenoid sinusitis.  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  Due to persistent cerebral edema  With  midline shift and worsening neurological status, patient is s/p Decompressive craniectomy (Left) with skull placed in abdomen on 11/28/17.   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  CT of the brain: Large area of hemorrhage in the left basal ganglia and left frontal  lobe, 5.8 x 3.5 cm. 15 mm of left-to-right midline shift.  MRI of the brain: Post craniotomy -8-9 mm of right-to-left midline shift with mild left uncal herniation similar to previous. Underlying evolving large acute ischemic left MCA territory  infarct involving the majority of the left MCA territory.  Repeat Head CT post evacuation 9/20- Stable when compared to yesterday, Left MCA territory infarct with evacuated hematoma. Midline shift measures 8 mm. 2-3 mm subdural hematoma along the right cerebral convexity without interval increase.  2D Echocardiogram : pending  CXR : Mild right basilar subsegmental atelectasis  LDL  : 64  HgbA1c  5.8  VTE: SCDs  Antiplatelets: no anti-coagulation secondary to intracranial hemorrhage  Atorvastatin : non at this time  Continue Rehab with PT consult, OT consult, Speech consult  Therapy recommendations: Pending  Disposition: Pending  Patient started on 3% hypertonic saline for cerebral edema with goals of sodium between 150-155.  Given a dose of mannitol this a.m. and central line placed for increased rate of hypertonic saline  Patient is critically ill and CCM consulted for further management.  Per patient's boyfriend's mother, patient parents are deceased and she has no siblings.  Her daughter is 30 years old and she has another 89 year old son who then able to locate at this time     Hypertension emergency  SBP persistently elevated  BP goal less than 140   Patient currently on a Cleviprex drip   Respiratory Inssuficency in patient with ischemic stroke with hemorrhagic conversion   Intubation in the ED  CCM managing vent  Anemia  Hemoglobin 8.4, previously 11.8 on  admission  Continue to monitor closely, no acute source of bleeding noted   Other Stroke Risk Factors  Stimulant use  ETOH use   Other Active Problems  ADHD  Anxiety  Depression  Hospital day # 1  SIGNED [facility] Neuro-hospitalist Team [phone] 11/28/2017, 2:21 PM   11/28/2017 ATTENDING ASSESSMENT   I have personally examined this patient, reviewed notes, independently viewed imaging studies, participated in medical decision making and plan of care.ROS completed by me personally and pertinent positives fully documented  I have made any additions or clarifications directly to the above note. Agree with note above.  The patient presented yesterday with left frontal and basal ganglia hematoma initially felt to be of hypertensive etiology but CT angiogram suggested left MCA stenosis versus clot. She underwent surgical evaluation of the blood clot but subsequent MRI scan shows large left hemispheric infarct with increasing cytotoxic edema and 8 mm left to right midline shift. The patient is at significant risk for further brain herniation and a reversible damage. Her neurological exam has not improved despite craniotomy yesterday and being started on hypertonic saline overnight.plan give IV mannitol 30 mL 1 now and increase hypertonic saline drip rate to 80 mL an hour. Requested critical care team to place central line. I had a  long discussion with Dr. Vertell Limber over the phone and discussed treatment options and the need to do decompressive hemicraniectomy to prevent progressive brain herniation which is likely to happen in the next day or 2. I also had a long discussion over the phone with the patient's boyfriend and his mother and explained clearly that the purpose of hemicraniectomy would be to save her life and possibly lower some disability but the patient is yet going to have major disability and require likely 24-hour care. They are in agreement with the plan to do life-saving  hemicraniectomy..casts with Dr. Nelda Marseille and Vertell Limber This patient is critically ill and at significant risk of neurological worsening, death and care requires constant monitoring of vital signs, hemodynamics,respiratory and cardiac monitoring, extensive review of multiple databases, frequent neurological assessment, discussion with family, other specialists and medical decision making of high complexity.I have made any additions or clarifications directly to the above note.This critical care time does not reflect procedure time, or teaching time or supervisory time of PA/NP/Med Resident etc but could involve care discussion time.  I spent 90 minutes of neurocritical care time  in the care of  this patient.      Antony Contras, MD Medical Director [facility] Pager: [phone] 11/28/2017 3:13 PM     To contact Stroke Continuity provider, please refer to [email or web address] After hours, contact General Neurology

## 2017-11-28 NOTE — Brief Op Note (Signed)
11/28/2017  12:49 PM  PATIENT:  Tammie GibsonKelly L Vandenberghe  42 y.o. female  PRE-OPERATIVE DIAGNOSIS:  Stroke with ICH and midline shift due to elevated ICP  POST-OPERATIVE DIAGNOSIS:  Stroke with ICH and midline shift due to elevated ICP  PROCEDURE:  Procedure(s): Decompressive craniectomy (Left) with skull placed in abdomen  SURGEON:  Surgeon(s) and Role:    Maeola Harman* Adea Geisel, MD - Primary  PHYSICIAN ASSISTANT:   ASSISTANTS: [place], RN   ANESTHESIA:   general  EBL:  100 mL   BLOOD ADMINISTERED:none  DRAINS: none   LOCAL MEDICATIONS USED:  MARCAINE    and LIDOCAINE   SPECIMEN:  No Specimen  DISPOSITION OF SPECIMEN:  N/A  COUNTS:  YES  TOURNIQUET:  * No tourniquets in log *  DICTATION: Patient is 42 year old woman who had a craniotomy yesterday for ICH, which was felt to be bleeding into a dominant hemisphere stroke.  She is comatose with persistent left sided brain edema and shift.  Per Neurology, it was recommended for patient to undergo a decompressive craniectomy.  It was therefore elected to take her to surgery for decompressive craniectomy.  Procedure:  Patient was intubated prior to arrival in OR, patient was placed in semi-lateral position with blanket roll.  Head was placed on donut head holder and left fronto-temporo-parietal scalp was shaved and prepped and draped in usual sterile fashion. Staples were removed from previous incision and T'ed posteriorly.  Area of planned incision was infiltrated with lidocaine. The prior bone flap was removed and the craniotomy defect was extended and a much bigger bone flap was elevated.  A large craniotomy flap was elevated. Skull flap was elevated exposing some small amount of epidural hematoma.  Dura was opened extensively. Hemostasis was assured.  The dura was placed back over the brain and 4 large sheets of Bovine pericardium were placed over the brain.The brain bulged out of craniotomy defect, but there did not appear to be uncontrollable  swelling.  The galea was closed with 2-0 vicryl stitches and the skin was re approximated with staples. Bone flap was then placed in a separate incision in the subcutaneous tissues of the right side of the abdomen and this wound was closed in a similar fashion.  Sterile occlusive dressings were placed. A sterile occlusive head wrap was placed and patient was taken to Neuro-ICU for post-operative management.    PLAN OF CARE: Admit to inpatient   PATIENT DISPOSITION:  PACU - hemodynamically stable.   Delay start of Pharmacological VTE agent (>24hrs) due to surgical blood loss or risk of bleeding: yes

## 2017-11-28 NOTE — Anesthesia Postprocedure Evaluation (Signed)
Anesthesia Post Note  Patient: Tammie Miles  Procedure(s) Performed: Decompressive craniectomy (Left Head)     Patient location during evaluation: SICU Anesthesia Type: General Level of consciousness: sedated Pain management: pain level controlled Vital Signs Assessment: post-procedure vital signs reviewed and stable Respiratory status: patient remains intubated per anesthesia plan Cardiovascular status: stable Postop Assessment: no apparent nausea or vomiting Anesthetic complications: no    Last Vitals:  Vitals:   11/28/17 1030 11/28/17 1245  BP: 138/79 107/70  Pulse: 75 84  Resp: (!) 22 16  Temp:  36.9 C  SpO2: 100% 100%    Last Pain:  Vitals:   11/28/17 1245  TempSrc: Oral                 Hakeem Frazzini,W. EDMOND

## 2017-11-28 NOTE — Progress Notes (Signed)
PT Cancellation Note  Patient Details Name: Tammie Miles MRN: [id number] DOB: Jul 18, 1975   Cancelled Treatment:    Reason Eval/Treat Not Completed: Patient not medically ready.  On bedrest. 11/28/2017  [place] BingKen Stephenson Cichy, PT Acute Rehabilitation Services [phone]  (pager) [phone]  (office)   Eliseo GumKenneth V Wen [place] 11/28/2017, 5:32 PM

## 2017-11-28 NOTE — Anesthesia Preprocedure Evaluation (Addendum)
Anesthesia Evaluation  Patient identified by MRN, date of birth, ID band Patient awake    Reviewed: Allergy & Precautions, H&P , NPO status , Patient's Chart, lab work & pertinent test results  Airway Mallampati: Intubated       Dental no notable dental hx. (+) Teeth Intact   Pulmonary  VDRF   Pulmonary exam normal breath sounds clear to auscultation       Cardiovascular hypertension,  Rhythm:Regular Rate:Normal     Neuro/Psych Anxiety Depression Elevated ICP    GI/Hepatic negative GI ROS, Neg liver ROS,   Endo/Other  negative endocrine ROS  Renal/GU negative Renal ROS  negative genitourinary   Musculoskeletal   Abdominal   Peds  Hematology negative hematology ROS (+)   Anesthesia Other Findings   Reproductive/Obstetrics negative OB ROS                            Anesthesia Physical Anesthesia Plan  ASA: IV  Anesthesia Plan: General   Post-op Pain Management:    Induction: Intravenous  PONV Risk Score and Plan: 3 and Treatment may vary due to age or medical condition  Airway Management Planned: Oral ETT  Additional Equipment:   Intra-op Plan:   Post-operative Plan: Post-operative intubation/ventilation  Informed Consent: I have reviewed the patients History and Physical, chart, labs and discussed the procedure including the risks, benefits and alternatives for the proposed anesthesia with the patient or authorized representative who has indicated his/her understanding and acceptance.   Dental advisory given  Plan Discussed with: CRNA  Anesthesia Plan Comments:         Anesthesia Quick Evaluation

## 2017-11-28 NOTE — Plan of Care (Signed)
  Problem: Education: Goal: Knowledge of disease or condition will improve Outcome: Not Progressing Note:  Patient intubated and sedated  Goal: Knowledge of secondary prevention will improve Outcome: Not Progressing Goal: Knowledge of patient specific risk factors addressed and post discharge goals established will improve Outcome: Not Progressing

## 2017-11-28 NOTE — Procedures (Signed)
Central Venous Catheter Insertion Procedure Note Tammie Miles [id number] 22-Aug-1975  Procedure: Insertion of Central Venous Catheter Indications: Assessment of intravascular volume, Drug and/or fluid administration and Frequent blood sampling  Procedure Details Consent: Risks of procedure as well as the alternatives and risks of each were explained to the (patient/caregiver).  Consent for procedure obtained. Time Out: Verified patient identification, verified procedure, site/side was marked, verified correct patient position, special equipment/implants available, medications/allergies/relevent history reviewed, required imaging and test results available.  Performed  Maximum sterile technique was used including antiseptics, cap, gloves, gown, hand hygiene, mask and sheet. Skin prep: Chlorhexidine; local anesthetic administered A antimicrobial bonded/coated triple lumen catheter was placed in the left subclavian vein using the Seldinger technique.  Evaluation Blood flow good Complications: No apparent complications Patient did tolerate procedure well. Chest X-ray ordered to verify placement.  CXR: pending.  YACOUB,WESAM 11/28/2017, 10:22 AM

## 2017-11-29 ENCOUNTER — Inpatient Hospital Stay (HOSPITAL_COMMUNITY): Payer: Medicaid Other

## 2017-11-29 LAB — POCT I-STAT 3, ART BLOOD GAS (G3+)
Acid-base deficit: 6 mmol/L — ABNORMAL HIGH (ref 0.0–2.0)
Bicarbonate: 19.5 mmol/L — ABNORMAL LOW (ref 20.0–28.0)
O2 Saturation: 96 %
PCO2 ART: 36.6 mmHg (ref 32.0–48.0)
PH ART: 7.335 — AB (ref 7.350–7.450)
TCO2: 21 mmol/L — AB (ref 22–32)
pO2, Arterial: 86 mmHg (ref 83.0–108.0)

## 2017-11-29 LAB — COMPREHENSIVE METABOLIC PANEL
ALBUMIN: 2.4 g/dL — AB (ref 3.5–5.0)
ALK PHOS: 53 U/L (ref 38–126)
ALT: 12 U/L (ref 0–44)
AST: 24 U/L (ref 15–41)
Anion gap: 6 (ref 5–15)
BILIRUBIN TOTAL: 0.4 mg/dL (ref 0.3–1.2)
BUN: 10 mg/dL (ref 6–20)
CALCIUM: 7.6 mg/dL — AB (ref 8.9–10.3)
CO2: 21 mmol/L — AB (ref 22–32)
CREATININE: 0.73 mg/dL (ref 0.44–1.00)
Chloride: 126 mmol/L — ABNORMAL HIGH (ref 98–111)
GFR calc Af Amer: >60 mL/min (ref >60)
GFR calc non Af Amer: >60 mL/min (ref >60)
GLUCOSE: 144 mg/dL — AB (ref 70–99)
Potassium: 3.8 mmol/L (ref 3.5–5.1)
Sodium: 153 mmol/L — ABNORMAL HIGH (ref 135–145)
TOTAL PROTEIN: 5.4 g/dL — AB (ref 6.5–8.1)

## 2017-11-29 LAB — SODIUM
SODIUM: 155 mmol/L — AB (ref 135–145)
SODIUM: 152 mmol/L — AB (ref 135–145)
SODIUM: 154 mmol/L — AB (ref 135–145)
Sodium: 153 mmol/L — ABNORMAL HIGH (ref 135–145)

## 2017-11-29 LAB — CBC
HEMATOCRIT: 25.6 % — AB (ref 36.0–46.0)
HEMATOCRIT: 27.7 % — AB (ref 36.0–46.0)
HEMOGLOBIN: 7.7 g/dL — AB (ref 12.0–15.0)
HEMOGLOBIN: 8.6 g/dL — AB (ref 12.0–15.0)
MCH: 24.6 pg — ABNORMAL LOW (ref 26.0–34.0)
MCH: 25 pg — AB (ref 26.0–34.0)
MCHC: 30.1 g/dL (ref 30.0–36.0)
MCHC: 31 g/dL (ref 30.0–36.0)
MCV: 81.8 fL (ref 78.0–100.0)
MCV: 80.5 fL (ref 78.0–100.0)
Platelets: 212 10*3/uL (ref 150–400)
Platelets: 269 10*3/uL (ref 150–400)
RBC: 3.13 MIL/uL — AB (ref 3.87–5.11)
RBC: 3.44 MIL/uL — AB (ref 3.87–5.11)
RDW: 19.3 % — ABNORMAL HIGH (ref 11.5–15.5)
RDW: 19.5 % — ABNORMAL HIGH (ref 11.5–15.5)
WBC: 15.2 10*3/uL — AB (ref 4.0–10.5)
WBC: 21.5 10*3/uL — ABNORMAL HIGH (ref 4.0–10.5)

## 2017-11-29 LAB — MAGNESIUM
Magnesium: 2.1 mg/dL (ref 1.7–2.4)
Magnesium: 2.1 mg/dL (ref 1.7–2.4)

## 2017-11-29 LAB — GLUCOSE, CAPILLARY
GLUCOSE-CAPILLARY: 116 mg/dL — AB (ref 70–99)
GLUCOSE-CAPILLARY: 125 mg/dL — AB (ref 70–99)
GLUCOSE-CAPILLARY: 122 mg/dL — AB (ref 70–99)
GLUCOSE-CAPILLARY: 176 mg/dL — AB (ref 70–99)
Glucose-Capillary: 157 mg/dL — ABNORMAL HIGH (ref 70–99)
Glucose-Capillary: 138 mg/dL — ABNORMAL HIGH (ref 70–99)

## 2017-11-29 LAB — PHOSPHORUS
PHOSPHORUS: 1.2 mg/dL — AB (ref 2.5–4.6)
Phosphorus: <1 mg/dL — CL (ref 2.5–4.6)

## 2017-11-29 LAB — CULTURE, RESPIRATORY: CULTURE: NORMAL

## 2017-11-29 LAB — CULTURE, RESPIRATORY W GRAM STAIN

## 2017-11-29 LAB — ECHOCARDIOGRAM COMPLETE
HEIGHTINCHES: 61 in
Weight: 2719.59 oz

## 2017-11-29 LAB — C-REACTIVE PROTEIN: CRP: 19.6 mg/dL — ABNORMAL HIGH (ref <1.0)

## 2017-11-29 LAB — SEDIMENTATION RATE: Sed Rate: 91 mm/hr — ABNORMAL HIGH (ref 0–22)

## 2017-11-29 LAB — LACTIC ACID, PLASMA: Lactic Acid, Venous: 1.9 mmol/L (ref 0.5–1.9)

## 2017-11-29 MED ORDER — SODIUM CHLORIDE 0.9% FLUSH
10.0000 mL | INTRAVENOUS | Status: DC | PRN
  Administered 2017-12-05: 10 mL
  Filled 2017-11-29: qty 40

## 2017-11-29 MED ORDER — SODIUM CHLORIDE 0.9% FLUSH
10.0000 mL | Freq: Two times a day (BID) | INTRAVENOUS | Status: DC
  Administered 2017-11-29 – 2017-12-07 (×12): 10 mL
  Administered 2017-12-08: 20 mL
  Administered 2017-12-09 – 2017-12-13 (×9): 10 mL
  Administered 2017-12-13 – 2017-12-14 (×2): 20 mL
  Administered 2017-12-14 – 2017-12-29 (×22): 10 mL

## 2017-11-29 MED ORDER — LISINOPRIL 20 MG PO TABS
20.0000 mg | ORAL_TABLET | Freq: Two times a day (BID) | ORAL | Status: DC
  Administered 2017-11-29 – 2018-01-01 (×66): 20 mg via ORAL
  Filled 2017-11-29 (×66): qty 1

## 2017-11-29 MED ORDER — AMLODIPINE BESYLATE 10 MG PO TABS
10.0000 mg | ORAL_TABLET | Freq: Every day | ORAL | Status: DC
  Administered 2017-11-29 – 2017-12-06 (×8): 10 mg via ORAL
  Filled 2017-11-29 (×8): qty 1

## 2017-11-29 MED ORDER — POTASSIUM CHLORIDE 20 MEQ/15ML (10%) PO SOLN
40.0000 meq | Freq: Once | ORAL | Status: AC
  Administered 2017-11-29: 40 meq via ORAL
  Filled 2017-11-29: qty 30

## 2017-11-29 MED ORDER — FUROSEMIDE 10 MG/ML IJ SOLN
20.0000 mg | Freq: Once | INTRAMUSCULAR | Status: AC
  Administered 2017-11-29: 20 mg via INTRAVENOUS
  Filled 2017-11-29: qty 2

## 2017-11-29 MED ORDER — CHLORHEXIDINE GLUCONATE CLOTH 2 % EX PADS
6.0000 | MEDICATED_PAD | Freq: Every day | CUTANEOUS | Status: DC
  Administered 2017-12-02 – 2017-12-03 (×2): 6 via TOPICAL

## 2017-11-29 NOTE — Progress Notes (Signed)
  Echocardiogram 2D Echocardiogram has been performed.  Tammie SkeenVijay  [name] 11/29/2017, 11:54 AM

## 2017-11-29 NOTE — Progress Notes (Signed)
Patient transported from 4N17 to CT and back without any complications.

## 2017-11-29 NOTE — Progress Notes (Signed)
NEUROHOSPITALISTS STROKE TEAM - DAILY PROGRESS NOTE   SUBJECTIVE Pt RN at bedside. Patient is still intubated and on fentanyl in the ICU. Barely open eyes on voice, not following commands. On pain, she is able to move LUE and LLE against gravity but still has right hemiplegia. CT repeat showed decrease of midline shift, but still show large left MCA infarct. Na 155, on 3% saline. Still on cleviprex.   OBJECTIVE Most recent Vital Signs: Vitals:   11/29/17 0700 11/29/17 0730 11/29/17 0745 11/29/17 0750  BP: (!) 151/79 (!) 152/83 (!) 149/86 (!) 154/78  Pulse: 86 83 80 82  Resp: 17 14 14 15   Temp:      TempSrc:      SpO2: 99% 97% 97% 97%  Weight:      Height:       CBG (last 3)  Recent Labs    11/28/17 2335 11/29/17 0344 11/29/17 0726  GLUCAP 134* 116* 125*    Physical Exam  Temp:  [98 F (36.7 C)-100 F (37.8 C)] 98 F (36.7 C) (09/21 0800) Pulse Rate:  [80-96] 82 (09/21 0750) Resp:  [14-23] 15 (09/21 0750) BP: (107-172)/(70-93) 154/78 (09/21 0750) SpO2:  [97 %-100 %] 97 % (09/21 0750) Arterial Line BP: (141-185)/(62-99) 185/86 (09/21 0750) FiO2 (%):  [30 %-40 %] 30 % (09/21 0718) Weight:  [77.1 kg] 77.1 kg (09/21 0500)   HEENT-status post craniotomy with dressing clean dry and intact  cardiovascular- S1-S2 audible, pulses palpable throughout   Lungs-and breath sounds Abdomen-hypoactive bowel sounds  musculoskeletal-no joint tenderness, deformity or swelling Skin-warm and dry Neuro - intubated on low dose sedation, barely open eyes on pain stimulation, b/l eyelid swollen, PERRL, eye midposition, sluggish doll's eye. Not blinking to visual threat, not tracking. Left corneal positive, and right absent. Gag positive. Facial symmetry not able to test due to ET. On pain stimulation, LUE localized to pain and able to against gravity. LLE withdraw to pain and 3-/5. RUE and RLE flaccid. B/l babinski positive. Sensation,  coordination and gait not tested.    Lipid Panel    Component Value Date/Time   CHOL 138 11/28/2017 0832   TRIG 148 11/28/2017 0958   HDL 42 11/28/2017 0832   CHOLHDL 3.3 11/28/2017 0832   VLDL 32 11/28/2017 0832   LDLCALC 64 11/28/2017 0832   HgbA1C  Lab Results  Component Value Date   HGBA1C 5.8 (H) 11/28/2017    Urine Drug Screen:      Component Value Date/Time   LABOPIA NONE DETECTED 11/27/2017 1415   COCAINSCRNUR NONE DETECTED 11/27/2017 1415   LABBENZ NONE DETECTED 11/27/2017 1415   AMPHETMU POSITIVE (A) 11/27/2017 1415   THCU NONE DETECTED 11/27/2017 1415   LABBARB NONE DETECTED 11/27/2017 1415    Alcohol Level:  Recent Labs  Lab 11/27/17 0948  ETH <10    Ct Angio Head W Or Wo Contrast  IMPRESSION: 1. Positive for left MCA M1 large vessel occlusion, and also severe stenosis at the left ICA terminus, superimposed on the relatively large acute left hemisphere intra-axial hemorrhage (estimated blood volume 43 mL). Vasospasm suspected in the proximal ACAs. This constellation might indicate an acute large vessel Left MCA infarct with malignant  hemorrhagic transformation. 2. Negative for intracranial aneurysm, CTA spot sign, or evidence of vascular malformation in association with the acute hemorrhage. 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 3. Stable intracranial mass effect since 0938 hours today, with 16 mm of leftward midline shift and trapping of the right lateral ventricle.   Ct Head Wo Contrast  11/28/2017 IMPRESSION: 1. Stable when compared to yesterday. 2. Left MCA territory infarct with evacuated hematoma. Midline shift measures 8 mm. 3. 2-3 mm subdural hematoma along the right cerebral convexity without interval increase.   Ct Head Wo Contrast  11/27/2017 IMPRESSION: 1. Interval LEFT frontal craniotomy for basal ganglia hematoma evacuation,  small amount of residual blood products and regional edema. 7 mm residual LEFT to RIGHT midline shift, resolved RIGHT ventricular entrapment. 2. Trace RIGHT holo hemispheric and tentorial subdural hematomas. Global edema.   Ct Head Wo Contrast 11/27/2017 IMPRESSION: Large area of hemorrhage in the left basal ganglia and left frontal lobe, 5.8 x 3.5 cm. 15 mm of left-to-right midline shift.  Mr Brain Wo Contrast 11/28/2017 IMPRESSION: 1. Postoperative changes from previous left frontal craniotomy for evacuation of left basal ganglia hematoma. Residual blood products within the evacuation cavity with persistent surrounding vasogenic edema and regional mass effect. Associated 8-9 mm of right-to-left midline shift with mild left uncal herniation similar to previous. No hydrocephalus or ventricular trapping. 2. Underlying evolving large acute ischemic left MCA territory infarct involving the majority of the left MCA territory. 3. Trace right cerebral and tentorial subdural hematoma without mass effect. 4. Right sphenoid sinusitis.  Ct Head Wo Contrast  Result Date: 11/29/2017 CLINICAL DATA:  42 y/o  F; stroke for follow-up. EXAM: CT HEAD WITHOUT CONTRAST TECHNIQUE: Contiguous axial images were obtained from the base of the skull through the vertex without intravenous contrast. COMPARISON:  11/27/2017 MRI head.  11/28/2017 CT head. FINDINGS: Brain: Large left MCA infarction is stable in distribution. Interval left frontal craniectomy with partial herniation of the left frontal lobe through the craniectomy defect. Interval resolution of uncal herniation, improved patency of the left lateral ventricle, and 6 mm left-to-right midline shift, previously 8 mm. Partial interval dispersion of acute hemorrhage in the left basal ganglia post partial evacuation. There is a subcentimeter density anterior to the prior position of blood products possibly representing retracted clot or interval hemorrhage (series 3, image 19),  attention at follow-up recommended. Vascular: No hyperdense vessel or unexpected calcification. Skull: Interval left frontal craniectomy with edema and several foci of air in the overlying scalp as well as skin staples. Sinuses/Orbits: Possible right sphenoid sinus opacification with aerosolized secretions. Normal aeration of the mastoid air cells. Other: None. IMPRESSION: 1. Large left MCA infarct is stable in distribution. 2. Interval left frontal craniectomy with mild herniation of left frontal lobe through the defect. 3. Resolved uncal herniation, improved patency of left lateral ventricle, and 6 mm left-to-right midline shift, previously 8 mm. 4. Partial interval dispersion of acute hemorrhage in the left basal ganglia. Subcentimeter density anterior to the prior position of blood products possibly representing retracted clot or interval hemorrhage, attention at follow-up recommended. Electronically Signed   By: Mitzi HansenLance  Furusawa-Stratton M.D.   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 15 mm of left-to-right midline shift.  MRI of the brain: Post craniotomy 8-9 mm of right-to-left midline shift with mild left uncal herniation similar to previous. Underlying evolving large acute ischemic left MCA territory infarct involving the majority of the left MCA territory.  Repeat Head CT post evacuation 9/20 - Left MCA territory infarct with evacuated hematoma. Midline shift measures 8 mm.   2D Echocardiogram : pending  LE  venous doppler pending  Hypercoagulable labs pending  Consider TEE once improved and stable  LDL  : 64  HgbA1c  5.8  VTE: SCDs  Antiplatelets: no anti-coagulation secondary to intracranial hemorrhage  Continue Rehab with PT consult, OT consult, Speech consult  Therapy recommendations: Pending  Disposition: Pending  Cerebral edema  CT head midline shift 15mm with uncal herniation  Repeat CT head s/p hematoma evacuation - midline shift 8mm  on 3% hypertonic saline for cerebral edema   Na goal between 150-155.    Na 153->155  S/p hemicrani 9/20  Repeat CT 9/21 - midline shift 6mm  On keppra for seizure prevention   Respiratory failure  Intubated on sedation  CCM on board  Continue on vent   Hypertension emergency  SBP persistently elevated  BP goal less than 160   Patient currently on a Cleviprex drip  Put on norvasc and lisinopril  Wean off cleviprex as able   Anemia  Hb 11.8 on admission  Hemoglobin 8.4 -> 7.7  Likely due to acute blood loss with surgery   Continue to monitor closely  PRBC transfusion if Hb , 7.0   Other Stroke Risk Factors  ETOH use   Other Active Problems  ADHD - on Adderell at home  Anxiety  Depression  Hospital day # 2  This patient is critically ill due to large left MCA infarct, uncal herniation s/p hemicrani, large hemorrhagic conversion, intubated and at significant risk of neurological worsening, death form stroke recurrence, hematoma expansion, cerebral edema and uncal herniation, seizure. This patient's care requires constant monitoring of vital signs, hemodynamics, respiratory and cardiac monitoring, review of multiple databases, neurological assessment, discussion with family, other specialists and medical decision making of high complexity. I spent 45 minutes of neurocritical care time in the care of this patient.  Marvel Plan, MD PhD Stroke Neurology 11/29/2017 11:27 AM    To contact Stroke  Continuity provider, please refer to [email or web address]. After hours, contact General Neurology

## 2017-11-29 NOTE — Progress Notes (Signed)
About 1630, pt vent ringing hi rate and hi MV. RT placed back on full support. When I came in, she was still tachypneic at 33 on full support. Her temp was 101.0. I gave tylenol, increased fentanyl, and gave cool bath to decrease temp. RR currently in 20s. Will cont to monitor.

## 2017-11-29 NOTE — Progress Notes (Addendum)
NAME:  Tammie Miles, MRN:  [id number], DOB:  12/30/1975, LOS: 2 ADMISSION DATE:  11/27/2017, CONSULTATION DATE:  11/27/2017 REFERRING MD:  Dr. Conchita ParisNundkumar CHIEF COMPLAINT:  Left frontal hemorrhage  Brief History   [age] yoF with AMS since 9/18 evening, thought due to intoxication. Still unresponsive this am.  Patient was brought to the ED where a CT was done and showed a large left sided ICH.  Patient was taken to the OR and a mini craniectomy was done.  Patient was brought out to the ICU and PCCM was consulted for vent management.    Significant Hospital Events   9/19 Admit/ OR 9/20>> Decompressive craniectomy ( L)  with skull harvesting in the abdomen  Consults: date of consult/date signed off & final recs:  9/19 NSGY 9/19 PCCM   Procedures (surgical and bedside):  9/19 Intubated 9/19 OR for crani, evac of hematoma>> no change in neuro status post op 9/20 Decompressive craniectomy ( L)  with skull harvesting in the abdomen  Significant Diagnostic Tests:  11/29/2017 CT Head Large left MCA infarct is stable in distribution. Interval left frontal craniectomy with mild herniation of left frontal lobe through the defect. Resolved uncal herniation, improved patency of left lateral ventricle, and 6 mm left-to-right midline shift, previously 8 mm. Partial interval dispersion of acute hemorrhage in the left basal ganglia. Subcentimeter density anterior to the prior position of blood products possibly representing retracted clot or interval hemorrhage, attention at follow-up recommended.  9/19 CTH  Large area of hemorrhage in the left basal ganglia and left frontal lobe, 5.8 x 3.5 cm. 15 mm of left-to-right midline shift.  9/19 CTA head 1. Positive for left MCA M1 large vessel occlusion, and also severe stenosis at the left ICA terminus, superimposed on the relatively large acute left hemisphere intra-axial hemorrhage (estimated blood volume 43 mL). Vasospasm suspected in the proximal ACAs.  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 2. Negative for intracranial aneurysm, CTA spot sign, or evidence of vascular malformation in association with the acute hemorrhage. 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 3. Stable intracranial mass effect since 0938 hours today, with 16 mm of leftward midline shift and trapping of the right lateral ventricle.  Micro Data: 9/19 MRSA PCR >> 9/19 Sputum Culture>> GS Rare yeast, Rare G+ rods  Antimicrobials:  9/19 Cefazolin (pre-op)  Subjective:  Sedated and intubated , moves L arm and leg to pain only. Opens R eye to pain Vent synchrony. Propofol off, Fentanyl at 100 mg/hr. Weaning on 10/5  Objective   Blood pressure (!) 154/78, pulse 82, temperature 98 F (36.7 C), temperature source Axillary, resp. rate 15, height 5\' 1"  (1.549 m), weight 77.1 kg, SpO2 97 %.    Vent Mode: PRVC FiO2 (%):  [30 %-40 %] 30 % Set Rate:  [16 bmp] 16 bmp Vt Set:  [380 mL] 380 mL PEEP:  [5 cmH20] 5 cmH20   Intake/Output Summary (Last 24 hours) at 11/29/2017 0938 Last data filed at 11/29/2017 0700 Gross per 24 hour  Intake 4798.68 ml  Output 3515 ml  Net 1283.68 ml   Filed Weights   11/27/17 1030 11/27/17 1400 11/29/17 0500  Weight: 80 kg 77.2 kg 77.1 kg    Examination: General: Acutely ill appearing female, sedated and intubated weaning 10/5 HENT: Crani dressing, NO LAD, No JVD, ETT, OGT secure and  intact Lungs: Bilateral excursion of the chest, Clear throughout, slightly diminished per bases Cardiovascular: RRR, S1, S2, No RMG, NSR per monitor Abdomen: Soft, NT, ND and +BS. Body mass index is 32.12 kg/m., honeycomb dressing to abdominal incision. Extremities: Trace edema, No obvious deformities noted Neuro: Sedated and intubated, moves L upper and lower   extremity to pain, does open R eye to  pain Skin: Warm and dry, intact, L crani incision CDI, abdominal incision  Resolved Hospital Problem list    Assessment & Plan:  Acute encephalopathy related to Large Left frontal IPH - S/p OR for craniotomy 9/19>> repeat OR 9/20 for crani >> swelling - Remains on 3% saline - Also on saline at 125/ hr, will defer to neuro re: continuation and rate as sodium level is at goal P:  Neurology primary Postop per NSGY Strict blood pressure control SBP < 140 with cleviprex  Hourly neuro checks Sodium checks per protocol   Hypertensive crisis, hx HTN + 4.8 L  BP within goal range on Cleviprex P:  cleviprex for SBP < 140 Labetalol prn Maintain art line Trend triglycerides  Acute respiratory insufficiency in the setting of massive IPH Tolerating 10/5 - CXR reviewed 9/20 - Weaning well 9/21 am - minimal  L base atelectasis per CXR 9/21  P : Full MV support 8 cc/kg Wean FiO2/ Peep for sats > 94% SBT as tolerated Continue to trend ABG's prn Minimize  sedation VAP measures  PAD protocol with   ID T Max 100.0 Leukocytosis Plan: Trend fever curve and WBC Follow Micro CXR prn Culture as clinically indicated   Renal: Hypokalemia>> resolved Hyperchloremia Calcium corrects to 8.9 Remains on 3% saline>> Na is 141 Primary non-gap Metabolic Acidosis P : Trend BMET Serum NA Q 6 Replete electrolytes as needed Decrease NS to 50 cc per hour ( running at 125/hr) Trend UO Trend Lactate  Anemia HGB drop to 9.3 post op now 7.7 Positive 4L last 24>> may be hemodilutional component/ Blood loss x 2 surgeries No obvious bleeding Plan: Trend CBC Transfuse fof HGB < 7 Monitor for bleeding Consider imaging head  GI  On  Claviprex Propofol off 9/21 TF Plan: Continue TF CBG's Q 4 Add SSI coverage Trend triglycerides SUP Pepcid   Possible ETOH abuse - unclear usage P:  empiric thiamine/ folate  Hx ADHD/ depression / anxiety P:   Will address as mental status improves   Disposition / Summary of Today's Plan 11/29/17   ICU, weaning 10/5  Emergent repeat OR 9/20 for Left MCA territory infarct with evacuated hematoma. Midline shift measures 6 mm after decompressive craniotomy.    Diet: TF Pain/Anxiety/Delirium protocol (if indicated): Propofol and fentanyl VAP protocol (if indicated) indicated DVT prophylaxis: SCDs only  GI prophylaxis: pepcid  Hyperglycemia protocol: CBG q 4; start SSI if CBG > 180 Mobility: BR Code Status: full Family Communication: Family updated bedside  Labs   CBC: Recent Labs  Lab 11/27/17 0933 11/27/17 0948 11/27/17 1202 11/27/17 1834 11/28/17 0832 11/29/17 0412  WBC 16.4*  --   --   --  14.0* 15.2*  HGB 11.8* 13.6 10.9* 9.3* 8.4* 7.7*  HCT 38.7 40.0 32.0* 30.6* 27.3* 25.6*  MCV 77.9*  --   --   --  79.4 81.8  PLT 335  --   --   --  228 212   Basic Metabolic Panel: Recent Labs  Lab 11/27/17 0933 11/27/17 0948 11/27/17 1202 11/27/17 1552  11/28/17 0445 11/28/17 1610 11/28/17 0852 11/28/17 1324  11/28/17 1819 11/28/17 2353 11/29/17 0412 11/29/17 0555  NA 140 138 138  --    < > 141 144 144 147* 149* 153* 153* 155*  K 3.0* 2.9* 2.9*  --   --  4.1 3.7  --   --   --   --  3.8  --   CL 98 100  --   --   --  112* 116*  --   --   --   --  126*  --   CO2 25  --   --   --   --  18* 19*  --   --   --   --  21*  --   GLUCOSE 144* 141*  --   --   --  172* 150*  --   --   --   --  144*  --   BUN 11 12  --   --   --  10 8  --   --   --   --  10  --   CREATININE 1.04* 0.80  --   --   --  0.95 0.88  --   --   --   --  0.73  --   CALCIUM 9.2  --   --   --   --  7.4* 7.5*  --   --   --   --  7.6*  --   MG  --   --   --  1.9  --   --  1.7 1.7  --   --   --  2.1  --   PHOS  --   --   --   --   --  2.0* 1.3*  --   --   --   --  1.2*  --    < > = values in this interval not displayed.   GFR: Estimated Creatinine Clearance: 86 mL/min (by C-G formula based on SCr of 0.73  mg/dL). Recent Labs  Lab 11/27/17 0933 11/27/17 0949 11/27/17 1552 11/28/17 0832 11/29/17 0412  PROCALCITON  --   --  <0.10  --   --   WBC 16.4*  --   --  14.0* 15.2*  LATICACIDVEN  --  4.07* 4.3*  --   --    Liver Function Tests: Recent Labs  Lab 11/27/17 0933 11/28/17 0445 11/29/17 0412  AST 24  --  24  ALT 15  --  12  ALKPHOS 83  --  53  BILITOT 1.2  --  0.4  PROT 7.6  --  5.4*  ALBUMIN 3.8 2.7* 2.4*   No results for input(s): LIPASE, AMYLASE in the last 168 hours. No results for input(s): AMMONIA in the last 168 hours. ABG    Component Value Date/Time   PHART 7.335 (L) 11/29/2017 0310   PCO2ART 36.6 11/29/2017 0310   PO2ART 86.0 11/29/2017 0310   HCO3 19.5 (L) 11/29/2017 0310   TCO2 21 (L) 11/29/2017 0310   ACIDBASEDEF 6.0 (H) 11/29/2017 0310   O2SAT 96.0 11/29/2017 0310    Coagulation Profile: Recent Labs  Lab 11/27/17 0933  INR 1.06   Cardiac Enzymes: No results for input(s): CKTOTAL, CKMB, CKMBINDEX, TROPONINI in the last 168 hours. HbA1C: Hgb A1c MFr Bld  Date/Time Value Ref Range Status  11/28/2017 08:32 AM 5.8 (H) 4.8 - 5.6 % Final    Comment:    (NOTE) Pre diabetes:  5.7%-6.4% Diabetes:              >6.4% Glycemic control for   <7.0% adults with diabetes    CBG: Recent Labs  Lab 11/28/17 1523 11/28/17 1936 11/28/17 2335 11/29/17 0344 11/29/17 0726  GLUCAP 99 135* 134* 116* 125*    Admitting History of Present Illness.   HPI obtained from medical chart review as patient is intubated and sedated on MV.   42 year old female with PMH of HTN, depression, anxiety, and ADHD who presented from home with altered mental status.  Patient lives with her 29 year old daughter and was apparently unable to be awakened yesterday evening.  Daughter called patient's boyfriend who thought she was possibly intoxicated.  This morning, patient still could not be awakened and therefore EMS was called by boyfriend.  On arrival to ER, she was nonverbal  and minimally responsive to pain and only moving left side with decerebrate posturing on right and hypertensive requiring labetalol and cleviprex for blood pressure control.  CT head shows large intraparenchymal bleed with midline shift to left.  CTA without evidence of aneurysm or AVM.  She was intubated for airway protection.  Mannitol given.  Neurosurgery consulted and patient taken to OR for craniotomy.  PCCM consulted for ventilator and medical management.   Review of Systems:   Unable to complete as patient is sedated on mechanical ventilation .   Past medical history  She,  has a past medical history of ADHD, Anxiety, Depression, and Hypertension.     Surgical History    9/19>> craniotomy evacuation of clot 9/20 >> Decompressive craniectomy with ( L)  with skull harvesting in the abdomen  Social History   Social History   Socioeconomic History  . Marital status: Married    Spouse name: Not on file  . Number of children: 1  . Years of education: Not on file  . Highest education level: Not on file  Occupational History  . Not on file  Social Needs  . Financial resource strain: Not on file  . Food insecurity:    Worry: Not on file    Inability: Not on file  . Transportation needs:    Medical: Not on file    Non-medical: Not on file  Tobacco Use  . Smoking status: Not on file  Substance and Sexual Activity  . Alcohol use: Yes  . Drug use: Not on file  . Sexual activity: Yes  Lifestyle  . Physical activity:    Days per week: Not on file    Minutes per session: Not on file  . Stress: Not on file  Relationships  . Social connections:    Talks on phone: Not on file    Gets together: Not on file    Attends religious service: Not on file    Active member of club or organization: Not on file    Attends meetings of clubs or organizations: Not on file    Relationship status: Not on file  . Intimate partner violence:    Fear of current or ex partner: Not on file     Emotionally abused: Not on file    Physically abused: Not on file    Forced sexual activity: Not on file  Other Topics Concern  . Not on file  Social History Narrative  . Not on file  ,  reports that she drinks alcohol.   Family history   Her family history includes Hypertension in her father and mother.  Allergies No Known Allergies  Home meds  Prior to Admission medications   Not on File    9/21>> No family at bedside   Bevelyn Ngo, AGACNP-BC [facility] Pulmonary/Critical Care Medicine. Pager: [phone]  11/29/2017  9:38 AM

## 2017-11-29 NOTE — Progress Notes (Signed)
PT Cancellation Note  Patient Details Name: Tammie Miles MRN: [id number] DOB: Nov 05, 1975   Cancelled Treatment:    Reason Eval/Treat Not Completed: Patient not medically ready(intubated on bedrest)   Ann Groeneveld B Alysah Carton 11/29/2017, 6:58 AM  Delaney MeigsMaija Tabor Ji Feldner, PT Acute Rehabilitation Services Pager: [phone] Office: [phone]

## 2017-11-29 NOTE — Progress Notes (Signed)
OT Cancellation Note  Patient Details Name: Urban GibsonKelly L Barcomb MRN: [id number] DOB: 1975/08/21   Cancelled Treatment:    Reason Eval/Treat Not Completed: Active bedrest order and pt intubated  Jeani HawkingWendi Tripton Ned, OTR/L Acute Rehabilitation Services Pager [phone] Office [phone]   Jeani HawkingConarpe, Kolbee Bogusz M 11/29/2017, 9:25 AM

## 2017-11-29 NOTE — Progress Notes (Signed)
Subjective: Patient reports currently sedated.  Has been moving left side spontaneously and to mouth care, along with opening eyes.  Objective: Vital signs in last 24 hours: Temp:  [98 F (36.7 C)-100 F (37.8 C)] 98 F (36.7 C) (09/21 0800) Pulse Rate:  [70-96] 82 (09/21 0750) Resp:  [14-30] 15 (09/21 0750) BP: (107-172)/(70-93) 154/78 (09/21 0750) SpO2:  [97 %-100 %] 97 % (09/21 0750) Arterial Line BP: (141-185)/(62-99) 185/86 (09/21 0750) FiO2 (%):  [30 %-40 %] 30 % (09/21 0718) Weight:  [77.1 kg] 77.1 kg (09/21 0500)  Intake/Output from previous day: 09/20 0701 - 09/21 0700 In: 5261.9 [I.V.:4740.9; NG/GT:121; IV Piggyback:400] Out: 3915 [Urine:3815; Blood:100] Intake/Output this shift: No intake/output data recorded.  Physical Exam: Dressings CDI.  Withdraws left side to noxious stim.  PERRL.   Lab Results: Recent Labs    11/28/17 0832 11/29/17 0412  WBC 14.0* 15.2*  HGB 8.4* 7.7*  HCT 27.3* 25.6*  PLT 228 212   BMET Recent Labs    11/28/17 0832  11/29/17 0412 11/29/17 0555  NA 144   < > 153* 155*  K 3.7  --  3.8  --   CL 116*  --  126*  --   CO2 19*  --  21*  --   GLUCOSE 150*  --  144*  --   BUN 8  --  10  --   CREATININE 0.88  --  0.73  --   CALCIUM 7.5*  --  7.6*  --    < > = values in this interval not displayed.    Studies/Results: Ct Angio Head W Or Wo Contrast  Addendum Date: 11/27/2017   ADDENDUM REPORT: 11/27/2017 11:17 ADDENDUM: Study discussed by telephone with Neurosurgeon Dr. Lisbeth Renshaw on 11/27/2017 at 1108 hours. Electronically Signed   By: Odessa Fleming M.D.   On: 11/27/2017 11:17   Result Date: 11/27/2017 CLINICAL DATA:  42 year old female found unresponsive with relatively large intra-axial hemorrhage in the left hemisphere on noncontrast head CT. EXAM: CT ANGIOGRAPHY HEAD TECHNIQUE: Multidetector CT imaging of the head was performed using the standard protocol during bolus administration of intravenous contrast. Multiplanar CT image  reconstructions and MIPs were obtained to evaluate the vascular anatomy. CONTRAST:  50mL ISOVUE-370 IOPAMIDOL (ISOVUE-370) INJECTION 76% COMPARISON:  Head CT without contrast 0938 hours today. FINDINGS: Posterior circulation: Mildly dominant distal left vertebral artery. Patent vertebral arteries to the vertebrobasilar junction without stenosis. Patent left PICA and dominant appearing right AICA origins. Patent basilar artery without stenosis. Mild tortuosity at the basilar tip. SCA and PCA origins are within normal limits. Posterior communicating arteries are diminutive or absent. Bilateral PCA branches are within normal limits. 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 Smooth tapering of the vessel. No right ICA siphon atherosclerosis or stenosis identified. Patent right ICA terminus. Normal right ophthalmic artery origin. Similar but lesser caliber variation in the distal left ICA which measures up to 6 millimeters diameter (coronal series 7, images 88 and 89. No left ICA siphon atherosclerosis, but there is moderate to severe vessel wall irregularity and stenosis in the supraclinoid segment (series 9, image 92 best demonstrated series 9, images 92-95. Despite this the left ICA terminus is patent. Both ACA A1 segments are diminutive but patent. There is mass effect on the left scratched at there is mass effect on the bilateral ACA branches which appear patent and within  normal limits. The right MCA M1 segment, right MCA trifurcation, and right MCA branches are within normal limits. The left MCA M1 segment is occluded about 6 millimeters beyond its origin (series 8, image 16) with poor reconstituted enhancement in the left MCA branches. No CTA spot sign is identified. No abnormal increased vascularity in the area of left hemisphere hematoma. Venous sinuses: Early contrast  timing, but grossly patent superior sagittal sinus. Anatomic variants: Dominant distal left vertebral artery. Other findings: Estimated left hemisphere intra-axial hemorrhage volume 43 milliliters (60 x 33 x 43 millimeters AP by transverse by CC). No intraventricular or extra-axial extension of blood is evident. Intracranial mass effect appears stable with rightward midline shift up to 16 millimeters. Substantial mass effect on the lateral ventricles redemonstrated. The right lateral ventricle appears trapped. Review of the MIP images confirms the above findings IMPRESSION: 1. Positive for left MCA M1 large vessel occlusion, and also severe stenosis at the left ICA terminus, superimposed on the relatively large acute left hemisphere intra-axial hemorrhage (estimated blood volume 43 mL). Vasospasm suspected in the proximal ACAs. 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 2. Negative for intracranial aneurysm, CTA spot sign, or evidence of vascular malformation in association with the acute hemorrhage. 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 3. Stable intracranial mass effect since 0938 hours today, with 16 mm of leftward midline shift and trapping of the right lateral ventricle. Study briefly discussed by telephone with Dr. Patria Mane in the ED on 11/27/2017 at 10:55. He advises the patient has now been taken to the neuro operating room, and I am now attempting to contact the Neurosurgeon regarding these findings. Electronically Signed: By: Odessa Fleming M.D. On: 11/27/2017 10:58   Ct Head Wo Contrast  Result Date: 11/29/2017 CLINICAL DATA:  42 y/o  F; stroke for follow-up. EXAM: CT HEAD WITHOUT CONTRAST TECHNIQUE: Contiguous axial images were obtained from the base of the skull through the vertex without intravenous contrast.  COMPARISON:  11/27/2017 MRI head.  11/28/2017 CT head. FINDINGS: Brain: Large left MCA infarction is stable in distribution. Interval left frontal craniectomy with partial herniation of the left frontal lobe through the craniectomy defect. Interval resolution of uncal herniation, improved patency of the left lateral ventricle, and 6 mm left-to-right midline shift, previously 8 mm. Partial interval dispersion of acute hemorrhage in the left basal ganglia post partial evacuation. There is a subcentimeter density anterior to the prior position of blood products possibly representing retracted clot or interval hemorrhage (series 3, image 19), attention at follow-up recommended. Vascular: No hyperdense vessel or unexpected calcification. Skull: Interval left frontal craniectomy with edema and several foci of air in the overlying scalp as well as skin staples. Sinuses/Orbits: Possible right sphenoid sinus opacification with aerosolized secretions. Normal aeration of the mastoid air cells. Other: None. IMPRESSION: 1. Large left MCA infarct is stable in distribution. 2. Interval left frontal craniectomy with mild herniation of left frontal lobe through the defect. 3. Resolved uncal herniation, improved patency of left lateral ventricle, and 6 mm left-to-right midline shift, previously 8 mm. 4. Partial interval dispersion of acute hemorrhage in the left basal ganglia. Subcentimeter density anterior to the prior position of blood products possibly representing retracted clot or interval hemorrhage, attention at follow-up recommended. Electronically Signed   By: Mitzi Hansen M.D.   On: 11/29/2017 06:00   Ct Head Wo  Contrast  Result Date: 11/28/2017 CLINICAL DATA:  Stroke follow-up EXAM: CT HEAD WITHOUT CONTRAST TECHNIQUE: Contiguous axial images were obtained from the base of the skull through the vertex without intravenous contrast. COMPARISON:  Yesterday FINDINGS: Brain: Acute infarct in the entirety of the  left MCA territory. There was superimposed hematoma requiring craniotomy. Residual blood products are stable at 18 mm on axial slices. There is a very thin subdural hematoma along the right cerebral convexity, stable at 2 to 3 mm. Cytotoxic edema continues to cause 8 mm of midline shift at the septum pellucidum. No ventriculomegaly. No evidence of new infarct. Vascular: Negative Skull: Unremarkable left frontal craniotomy. Sinuses/Orbits: Small volume secretions in the right sphenoid sinus IMPRESSION: 1. Stable when compared to yesterday. 2. Left MCA territory infarct with evacuated hematoma. Midline shift measures 8 mm. 3. 2-3 mm subdural hematoma along the right cerebral convexity without interval increase. Electronically Signed   By: Marnee Spring M.D.   On: 11/28/2017 09:20   Ct Head Wo Contrast  Result Date: 11/27/2017 CLINICAL DATA:  Follow up intracranial hemorrhage. Status post LEFT frontotemporal craniotomy. EXAM: CT HEAD WITHOUT CONTRAST TECHNIQUE: Contiguous axial images were obtained from the base of the skull through the vertex without intravenous contrast. COMPARISON:  CT HEAD November 27, 2017 FINDINGS: BRAIN: Interval evacuation LEFT basal ganglia hematoma was small amount of residual blood products with extending along the surgical approach. Decreased surrounding edema. Small amount of LEFT frontal intraparenchymal and extra-axial pneumocephalus. 7 mm LEFT-to-RIGHT midline shift, decreased from 15 mm. Re-expanded LEFT lateral ventricle. Resolved RIGHT ventricular entrapment. No hydrocephalus. No acute large vascular territory infarcts. Trace RIGHT holo hemispheric and tentorial subdural hematoma. Global edema with effaced basal cisterns. VASCULAR: Unremarkable. SKULL/SOFT TISSUES: Status post LEFT frontal craniotomy. LEFT frontal hemangioma. LEFT scalp soft tissue swelling with subcutaneous gas and overlying skin staples. No significant soft tissue swelling. ORBITS/SINUSES: The included  ocular globes and orbital contents are normal.Lobulated RIGHT sphenoid sinus mucosal thickening with small air-fluid level. Mastoid air cells are well aerated. OTHER: None. IMPRESSION: 1. Interval LEFT frontal craniotomy for basal ganglia hematoma evacuation, small amount of residual blood products and regional edema. 7 mm residual LEFT to RIGHT midline shift, resolved RIGHT ventricular entrapment. 2. Trace RIGHT holo hemispheric and tentorial subdural hematomas. Global edema. Electronically Signed   By: Awilda Metro M.D.   On: 11/27/2017 17:02   Ct Head Wo Contrast  Result Date: 11/27/2017 CLINICAL DATA:  Altered mental status EXAM: CT HEAD WITHOUT CONTRAST TECHNIQUE: Contiguous axial images were obtained from the base of the skull through the vertex without intravenous contrast. COMPARISON:  05/22/2009 FINDINGS: Brain: Large area of hemorrhage in the region of the left basal ganglia and extending into the left frontal lobe measuring approximately 5.8 x 3.5 cm. There is 15 mm of left-to-right midline shift. No hydrocephalus. Vascular: No hyperdense vessel or unexpected calcification. Skull: No acute calvarial abnormality. Sinuses/Orbits: Visualized paranasal sinuses and mastoids clear. Orbital soft tissues unremarkable. Other: None IMPRESSION: Large area of hemorrhage in the left basal ganglia and left frontal lobe, 5.8 x 3.5 cm. 15 mm of left-to-right midline shift. Critical Value/emergent results were called by telephone at the time of interpretation on 11/27/2017 at 9:56 am to Dr. Azalia Bilis , who verbally acknowledged these results. Electronically Signed   By: Charlett Nose M.D.   On: 11/27/2017 09:57   Mr Brain Wo Contrast  Result Date: 11/28/2017 CLINICAL DATA:  Follow-up examination for intracranial hemorrhage. EXAM: MRI HEAD WITHOUT CONTRAST TECHNIQUE: Multiplanar,  multiecho pulse sequences of the brain and surrounding structures were obtained without intravenous contrast. COMPARISON:  Prior CT  and CTA from earlier the same day. FINDINGS: Brain: Postoperative changes from recent left frontal craniotomy for evacuation of left basal ganglia hematoma again seen. Residual fluid and blood products seen within the resection cavity with probable superimposed scattered foci of pneumocephalus. Overall, size relatively stable from recent exams. Surrounding vasogenic edema and regional mass effect with attenuation of the adjacent left lateral ventricle. Persistent 8-9 mm of left-to-right midline shift with left uncal herniation (series 15, image 16). Basilar cisterns remain patent. No hydrocephalus or ventricular trapping. Additional small right holo hemispheric subdural hematoma measures up to 3 mm in maximal thickness. Extension along the tentorium again noted. Underlying extensive cytotoxic edema involving the majority of the left MCA territory with involvement of the left frontal, parietal, and temporal occipital lobes, consistent with underlying evolving acute left MCA territory infarct. Associated gyral swelling and edema throughout the area of infarction. No other parenchymal hemorrhage outside the area of basal ganglia hemorrhage. No underlying mass lesion. Remainder of the brain is relatively normal in appearance. Pituitary gland normal. Vascular: Major intracranial vascular flow voids are maintained Skull and upper cervical spine: Craniocervical junction within normal limits. No transtentorial herniation. Upper cervical spine normal. No focal marrow replacing lesion. Sequelae of prior left frontal craniotomy. Skin staples remain in place. Sinuses/Orbits: Globes and orbital soft tissues within normal limits. Right sphenoid sinus disease noted. Paranasal sinuses are otherwise largely clear. Trace right mastoid effusion, of doubtful significance. Other: None. IMPRESSION: 1. Postoperative changes from previous left frontal craniotomy for evacuation of left basal ganglia hematoma. Residual blood products within  the evacuation cavity with persistent surrounding vasogenic edema and regional mass effect. Associated 8-9 mm of right-to-left midline shift with mild left uncal herniation similar to previous. No hydrocephalus or ventricular trapping. 2. Underlying evolving large acute ischemic left MCA territory infarct involving the majority of the left MCA territory. 3. Trace right cerebral and tentorial subdural hematoma without mass effect. 4. Right sphenoid sinusitis. Electronically Signed   By: Rise MuBenjamin  McClintock M.D.   On: 11/28/2017 02:01   Dg Chest Port 1 View  Result Date: 11/29/2017 CLINICAL DATA:  Respiratory failure. EXAM: PORTABLE CHEST 1 VIEW COMPARISON:  Chest x-rays dated 11/28/2017 and 11/27/2017 FINDINGS: Endotracheal tube tip is 4 cm above the carina. Central venous catheter tip is at the cavoatrial junction. NG tube tip is in the stomach. Heart size and vascularity are normal. Minimal atelectasis at the left lung base laterally. Lungs are otherwise clear. IMPRESSION: Minimal left base atelectasis. Support apparatus appear in good position. Electronically Signed   By: Francene BoyersJames  Maxwell M.D.   On: 11/29/2017 07:16   Dg Chest Port 1 View  Result Date: 11/28/2017 CLINICAL DATA:  Central line placement. EXAM: PORTABLE CHEST 1 VIEW COMPARISON:  Radiograph of same day. FINDINGS: Stable cardiomediastinal silhouette. No pneumothorax or pleural effusion is noted. Minimal right basilar subsegmental atelectasis is noted. Endotracheal and nasogastric tubes are unchanged in position. Interval placement of left subclavian catheter with distal tip in expected position of right atrium. Withdrawal by 2-3 cm is recommended. Bony thorax is unremarkable. IMPRESSION: Mild right basilar subsegmental atelectasis. Endotracheal and nasogastric tubes are unchanged in position. Interval placement of left subclavian catheter with distal tip in expected position of right atrium; withdrawal by 2-3 cm is recommended. Electronically  Signed   By: Lupita RaiderJames  Green Jr, M.D.   On: 11/28/2017 10:44   Portable Chest Xray  Result Date: 11/28/2017 CLINICAL DATA:  Check endotracheal tube placement EXAM: PORTABLE CHEST 1 VIEW COMPARISON:  11/27/2017 FINDINGS: Cardiac shadow is again mildly enlarged. The nasogastric catheter has been withdrawn and readvanced with the tip now in the stomach. Proximal side port lies in the distal esophagus. This could be advanced several cm as necessary. The endotracheal tube is stable in satisfactory position. Mild right basilar atelectasis is again noted. No pneumothorax is seen. No bony abnormality is noted. IMPRESSION: Nasogastric catheter is better positioned although could be advanced several cm further into the stomach as necessary. Stable right basilar atelectasis. Electronically Signed   By: Alcide Clever M.D.   On: 11/28/2017 07:25   Dg Chest Port 1 View  Result Date: 11/27/2017 CLINICAL DATA:  Endotracheal tube placement EXAM: PORTABLE CHEST 1 VIEW COMPARISON:  None. FINDINGS: 1555 hours. Low lung volumes with asymmetric elevation right hemidiaphragm. Basilar atelectasis noted, right greater than left. Heart size mildly enlarged. Endotracheal tube tip 2.9 cm above the base the carina. The NG tube is looped upon itself in the distal esophagus with the tip of the NG tube in the mid esophagus. Telemetry leads overlie the chest. IMPRESSION: 1. Endotracheal tube tip 2.9 cm above the base the carina. 2. NG tube tip is folded back on itself in the distal esophagus with the tip in the mid esophagus. Electronically Signed   By: Kennith Center M.D.   On: 11/27/2017 16:21   Dg Abd Portable 1v  Result Date: 11/27/2017 CLINICAL DATA:  OG tube placement EXAM: PORTABLE ABDOMEN - 1 VIEW COMPARISON:  Chest x-ray 11/27/2017 FINDINGS: Cardiomegaly with partial consolidation medial left lung base. Esophageal tube tip overlies the proximal stomach/GE junction region, side-port over the distal esophagus. IMPRESSION: 1.  Esophageal tube tip overlies GE junction region with side port over the distal esophagus; suggest further advancement for more optimal positioning 2. Cardiomegaly with partial consolidation at the left base Electronically Signed   By: Jasmine Pang M.D.   On: 11/27/2017 16:16   [place] Ekg Site Rite  Result Date: 11/27/2017 If Site Rite image not attached, placement could not be confirmed due to current cardiac rhythm.   Assessment/Plan: Shift improved after craniectomy.  Continue stroke management per Neurology.    LOS: 2 days    Dorian Heckle, MD 11/29/2017, 9:13 AM

## 2017-11-30 ENCOUNTER — Inpatient Hospital Stay (HOSPITAL_COMMUNITY): Payer: Medicaid Other

## 2017-11-30 DIAGNOSIS — E876 Hypokalemia: Secondary | ICD-10-CM

## 2017-11-30 DIAGNOSIS — I82409 Acute embolism and thrombosis of unspecified deep veins of unspecified lower extremity: Secondary | ICD-10-CM

## 2017-11-30 DIAGNOSIS — D62 Acute posthemorrhagic anemia: Secondary | ICD-10-CM

## 2017-11-30 LAB — COMPREHENSIVE METABOLIC PANEL
ALBUMIN: 2.2 g/dL — AB (ref 3.5–5.0)
ALK PHOS: 66 U/L (ref 38–126)
ALT: 15 U/L (ref 0–44)
ANION GAP: 9 (ref 5–15)
AST: 30 U/L (ref 15–41)
BUN: 13 mg/dL (ref 6–20)
CHLORIDE: 122 mmol/L — AB (ref 98–111)
CO2: 25 mmol/L (ref 22–32)
Calcium: 7.9 mg/dL — ABNORMAL LOW (ref 8.9–10.3)
Creatinine, Ser: 0.63 mg/dL (ref 0.44–1.00)
GFR calc non Af Amer: >60 mL/min (ref >60)
GLUCOSE: 141 mg/dL — AB (ref 70–99)
POTASSIUM: 3 mmol/L — AB (ref 3.5–5.1)
SODIUM: 156 mmol/L — AB (ref 135–145)
Total Bilirubin: 0.6 mg/dL (ref 0.3–1.2)
Total Protein: 6.2 g/dL — ABNORMAL LOW (ref 6.5–8.1)

## 2017-11-30 LAB — GLUCOSE, CAPILLARY
GLUCOSE-CAPILLARY: 125 mg/dL — AB (ref 70–99)
GLUCOSE-CAPILLARY: 162 mg/dL — AB (ref 70–99)
GLUCOSE-CAPILLARY: 141 mg/dL — AB (ref 70–99)
GLUCOSE-CAPILLARY: 119 mg/dL — AB (ref 70–99)
Glucose-Capillary: 120 mg/dL — ABNORMAL HIGH (ref 70–99)
Glucose-Capillary: 127 mg/dL — ABNORMAL HIGH (ref 70–99)

## 2017-11-30 LAB — CBC
HCT: 27.2 % — ABNORMAL LOW (ref 36.0–46.0)
Hemoglobin: 8.2 g/dL — ABNORMAL LOW (ref 12.0–15.0)
MCH: 24.3 pg — ABNORMAL LOW (ref 26.0–34.0)
MCHC: 30.1 g/dL (ref 30.0–36.0)
MCV: 80.7 fL (ref 78.0–100.0)
PLATELETS: 235 10*3/uL (ref 150–400)
RBC: 3.37 MIL/uL — AB (ref 3.87–5.11)
RDW: 19.4 % — ABNORMAL HIGH (ref 11.5–15.5)
WBC: 16.8 10*3/uL — AB (ref 4.0–10.5)

## 2017-11-30 LAB — PHOSPHORUS
PHOSPHORUS: 1.2 mg/dL — AB (ref 2.5–4.6)
PHOSPHORUS: 2.1 mg/dL — AB (ref 2.5–4.6)

## 2017-11-30 LAB — URINALYSIS, ROUTINE W REFLEX MICROSCOPIC
Bilirubin Urine: NEGATIVE
GLUCOSE, UA: NEGATIVE mg/dL
HGB URINE DIPSTICK: NEGATIVE
KETONES UR: NEGATIVE mg/dL
Leukocytes, UA: NEGATIVE
Nitrite: NEGATIVE
PROTEIN: NEGATIVE mg/dL
Specific Gravity, Urine: 1.014 (ref 1.005–1.030)
pH: 6 (ref 5.0–8.0)

## 2017-11-30 LAB — SODIUM
SODIUM: 154 mmol/L — AB (ref 135–145)
SODIUM: 155 mmol/L — AB (ref 135–145)
SODIUM: 157 mmol/L — AB (ref 135–145)
Sodium: 157 mmol/L — ABNORMAL HIGH (ref 135–145)

## 2017-11-30 LAB — MAGNESIUM
Magnesium: 2.1 mg/dL (ref 1.7–2.4)
Magnesium: 2.2 mg/dL (ref 1.7–2.4)

## 2017-11-30 LAB — CALCIUM, IONIZED: CALCIUM, IONIZED, SERUM: 4.8 mg/dL (ref 4.5–5.6)

## 2017-11-30 LAB — PROCALCITONIN: Procalcitonin: 0.24 ng/mL

## 2017-11-30 LAB — TRIGLYCERIDES
Triglycerides: 277 mg/dL — ABNORMAL HIGH (ref <150)
Triglycerides: 414 mg/dL — ABNORMAL HIGH (ref <150)

## 2017-11-30 LAB — LACTIC ACID, PLASMA: Lactic Acid, Venous: 1.3 mmol/L (ref 0.5–1.9)

## 2017-11-30 MED ORDER — POTASSIUM CHLORIDE 20 MEQ/15ML (10%) PO SOLN
40.0000 meq | Freq: Two times a day (BID) | ORAL | Status: DC
  Administered 2017-11-30 (×2): 40 meq via ORAL
  Filled 2017-11-30 (×3): qty 30

## 2017-11-30 MED ORDER — POTASSIUM PHOSPHATES 15 MMOLE/5ML IV SOLN
20.0000 mmol | Freq: Once | INTRAVENOUS | Status: AC
  Administered 2017-11-30: 20 mmol via INTRAVENOUS
  Filled 2017-11-30: qty 6.67

## 2017-11-30 MED ORDER — LABETALOL HCL 100 MG PO TABS
100.0000 mg | ORAL_TABLET | Freq: Three times a day (TID) | ORAL | Status: DC
  Administered 2017-11-30 – 2017-12-01 (×3): 100 mg via ORAL
  Filled 2017-11-30 (×3): qty 1

## 2017-11-30 MED ORDER — POTASSIUM CHLORIDE 20 MEQ/15ML (10%) PO SOLN: 40.0000 meq | Freq: Two times a day (BID) | ORAL | Status: DC

## 2017-11-30 MED ORDER — HEPARIN SODIUM (PORCINE) 5000 UNIT/ML IJ SOLN
5000.0000 [IU] | Freq: Three times a day (TID) | INTRAMUSCULAR | Status: AC
  Administered 2017-11-30 – 2017-12-08 (×26): 5000 [IU] via SUBCUTANEOUS
  Filled 2017-11-30 (×26): qty 1

## 2017-11-30 MED ORDER — POTASSIUM CHLORIDE 20 MEQ/15ML (10%) PO SOLN: 40.0000 meq | Freq: Three times a day (TID) | ORAL | Status: DC

## 2017-11-30 NOTE — Progress Notes (Signed)
Spoke with Maralyn SagoSarah CCM about foley. She orders us to keep foley in at this time, can re-evaluate in AM.

## 2017-11-30 NOTE — Plan of Care (Signed)
Pt not progressing neurologically. No visitors so far today. Pt has been cultured and is on cooling blanket for temps.

## 2017-11-30 NOTE — Progress Notes (Signed)
Subjective: Patient reports no verbal response (intubated)  Objective: Vital signs in last 24 hours: Temp:  [98.4 F (36.9 C)-101 F (38.3 C)] 101 F (38.3 C) (09/22 0800) Pulse Rate:  [75-114] 114 (09/22 0830) Resp:  [15-29] 22 (09/22 0830) BP: (132-189)/(64-88) 166/82 (09/22 0830) SpO2:  [93 %-98 %] 97 % (09/22 0830) Arterial Line BP: (159-193)/(66-161) 164/161 (09/21 1600) FiO2 (%):  [30 %] 30 % (09/22 0400) Weight:  [75.1 kg] 75.1 kg (09/22 0546)  Intake/Output from previous day: 09/21 0701 - 09/22 0700 In: 2681.1 [I.V.:2351.1; NG/GT:30; IV Piggyback:300] Out: 4960 [Urine:4960] Intake/Output this shift: Total I/O In: 646.2 [I.V.:76.2; NG/GT:570] Out: 350 [Urine:350]  Physical Exam: Spontaneously opening eyes.  No tracking, blink to threat or following commands.  Withdraws left side to noxious stim.  Dressing CDI.  Lab Results: Recent Labs    11/29/17 1742 11/30/17 0521  WBC 21.5* 16.8*  HGB 8.6* 8.2*  HCT 27.7* 27.2*  PLT 269 235   BMET Recent Labs    11/29/17 0412  11/29/17 2346 11/30/17 0521  NA 153*   < > 155* 154*  156*  K 3.8  --   --  3.0*  CL 126*  --   --  122*  CO2 21*  --   --  25  GLUCOSE 144*  --   --  141*  BUN 10  --   --  13  CREATININE 0.73  --   --  0.63  CALCIUM 7.6*  --   --  7.9*   < > = values in this interval not displayed.    Studies/Results: Ct Head Wo Contrast  Result Date: 11/29/2017 CLINICAL DATA:  42 y/o  F; stroke for follow-up. EXAM: CT HEAD WITHOUT CONTRAST TECHNIQUE: Contiguous axial images were obtained from the base of the skull through the vertex without intravenous contrast. COMPARISON:  11/27/2017 MRI head.  11/28/2017 CT head. FINDINGS: Brain: Large left MCA infarction is stable in distribution. Interval left frontal craniectomy with partial herniation of the left frontal lobe through the craniectomy defect. Interval resolution of uncal herniation, improved patency of the left lateral ventricle, and 6 mm  left-to-right midline shift, previously 8 mm. Partial interval dispersion of acute hemorrhage in the left basal ganglia post partial evacuation. There is a subcentimeter density anterior to the prior position of blood products possibly representing retracted clot or interval hemorrhage (series 3, image 19), attention at follow-up recommended. Vascular: No hyperdense vessel or unexpected calcification. Skull: Interval left frontal craniectomy with edema and several foci of air in the overlying scalp as well as skin staples. Sinuses/Orbits: Possible right sphenoid sinus opacification with aerosolized secretions. Normal aeration of the mastoid air cells. Other: None. IMPRESSION: 1. Large left MCA infarct is stable in distribution. 2. Interval left frontal craniectomy with mild herniation of left frontal lobe through the defect. 3. Resolved uncal herniation, improved patency of left lateral ventricle, and 6 mm left-to-right midline shift, previously 8 mm. 4. Partial interval dispersion of acute hemorrhage in the left basal ganglia. Subcentimeter density anterior to the prior position of blood products possibly representing retracted clot or interval hemorrhage, attention at follow-up recommended. Electronically Signed   By: Mitzi HansenLance  Furusawa-Stratton M.D.   On: 11/29/2017 06:00   Dg Chest Port 1 View  Result Date: 11/30/2017 CLINICAL DATA:  Respiratory failure EXAM: PORTABLE CHEST 1 VIEW COMPARISON:  11/29/2017 FINDINGS: Lungs are essentially clear.  No pleural effusion or pneumothorax. Cardiomegaly. Endotracheal tube terminates 3 cm above the carina. Left subclavian venous  catheter terminates in the right atrium, 2 cm below the cavoatrial junction. Enteric tube courses into the stomach. IMPRESSION: Endotracheal tube terminates 3 cm above the carina. Left subclavian venous catheter terminates in the right atrium, 2 cm below the cavoatrial junction. Electronically Signed   By: Charline Bills M.D.   On: 11/30/2017  07:47   Dg Chest Port 1 View  Result Date: 11/29/2017 CLINICAL DATA:  Respiratory failure. EXAM: PORTABLE CHEST 1 VIEW COMPARISON:  Chest x-rays dated 11/28/2017 and 11/27/2017 FINDINGS: Endotracheal tube tip is 4 cm above the carina. Central venous catheter tip is at the cavoatrial junction. NG tube tip is in the stomach. Heart size and vascularity are normal. Minimal atelectasis at the left lung base laterally. Lungs are otherwise clear. IMPRESSION: Minimal left base atelectasis. Support apparatus appear in good position. Electronically Signed   By: Francene Boyers M.D.   On: 11/29/2017 07:16   Dg Chest Port 1 View  Result Date: 11/28/2017 CLINICAL DATA:  Central line placement. EXAM: PORTABLE CHEST 1 VIEW COMPARISON:  Radiograph of same day. FINDINGS: Stable cardiomediastinal silhouette. No pneumothorax or pleural effusion is noted. Minimal right basilar subsegmental atelectasis is noted. Endotracheal and nasogastric tubes are unchanged in position. Interval placement of left subclavian catheter with distal tip in expected position of right atrium. Withdrawal by 2-3 cm is recommended. Bony thorax is unremarkable. IMPRESSION: Mild right basilar subsegmental atelectasis. Endotracheal and nasogastric tubes are unchanged in position. Interval placement of left subclavian catheter with distal tip in expected position of right atrium; withdrawal by 2-3 cm is recommended. Electronically Signed   By: Lupita Raider, M.D.   On: 11/28/2017 10:44    Assessment/Plan: Continue support.  Nothing to add from Neurosurgery at present.    LOS: 3 days    Dorian Heckle, MD 11/30/2017, 9:55 AM

## 2017-11-30 NOTE — Progress Notes (Signed)
Bilateral lower extremity venous duplex has been completed. Negative for DVT.  11/30/17 11:10 AM Olen CordialGreg Khameron Gruenwald RVT

## 2017-11-30 NOTE — Progress Notes (Signed)
PT Cancellation Note  Patient Details Name: Tammie Miles MRN: [id number] DOB: 12-Aug-1975   Cancelled Treatment:    Reason Eval/Treat Not Completed: Patient not medically ready(intubated on bedrest)   Caleigh Rabelo B Nasim Garofano 11/30/2017, 6:58 AM  Delaney MeigsMaija Tabor Shelsy Seng, PT Acute Rehabilitation Services Pager: [phone] Office: [phone]

## 2017-11-30 NOTE — Progress Notes (Signed)
Just saw order to decrease hypertonic saline to 20. Change made at 1440.

## 2017-11-30 NOTE — Progress Notes (Addendum)
NAME:  Tammie Miles, MRN:  [id number], DOB:  [birth year], LOS: 3 ADMISSION DATE:  11/27/2017, CONSULTATION DATE:  11/27/2017 REFERRING MD:  Dr. Conchita Paris CHIEF COMPLAINT:  Left frontal hemorrhage  Brief History   42 yoF with AMS since 9/18 evening, thought due to intoxication. Still unresponsive this am.  Patient was brought to the ED where a CT was done and showed a large left sided ICH.  Patient was taken to the OR and a mini craniectomy was done.  Patient was brought out to the ICU and PCCM was consulted for vent management.    Significant Hospital Events   9/19 Admit/ OR 9/20>> Decompressive craniectomy ( L)  with skull harvesting in the abdomen  Consults: date of consult/date signed off & final recs:  9/19 NSGY 9/19 PCCM   Procedures (surgical and bedside):  9/19 Intubated 9/19 OR for crani, evac of hematoma>> no change in neuro status post op 9/20 Decompressive craniectomy ( L)  with skull harvesting in the abdomen  Significant Diagnostic Tests:  11/29/2017 CT Head Large left MCA infarct is stable in distribution. Interval left frontal craniectomy with mild herniation of left frontal lobe through the defect. Resolved uncal herniation, improved patency of left lateral ventricle, and 6 mm left-to-right midline shift, previously 8 mm. Partial interval dispersion of acute hemorrhage in the left basal ganglia. Subcentimeter density anterior to the prior position of blood products possibly representing retracted clot or interval hemorrhage, attention at follow-up recommended.  9/19 CTH  Large area of hemorrhage in the left basal ganglia and left frontal lobe, 5.8 x 3.5 cm. 15 mm of left-to-right midline shift.  9/19 CTA head 1. Positive for left MCA M1 large vessel occlusion, and also severe stenosis at the left ICA terminus, superimposed on the relatively large acute left hemisphere intra-axial hemorrhage (estimated blood volume 43 mL). Vasospasm suspected in the proximal ACAs.  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 2. Negative for intracranial aneurysm, CTA spot sign, or evidence of vascular malformation in association with the acute hemorrhage. 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 3. Stable intracranial mass effect since 0938 hours today, with 16 mm of leftward midline shift and trapping of the right lateral ventricle.  Micro Data: 9/19 MRSA PCR >> Negative 9/19 Sputum Culture>> GS Rare yeast, Rare G+ rods 9/22 Blood x 2>> 9/22 UA>> Normal 9/22>> Sputum>>  Antimicrobials:  9/19 Cefazolin (pre-op)  Subjective:  Sedated and intubated , moves L arm and leg to pain only. Opens R eye to pain Vent synchrony. Propofol off, Fentanyl off. Weaning on 42/5 No significant change in Neuro exam  off sedation New fever  Objective   Blood pressure (!) 166/82, pulse (!) 114, temperature (!) 101 F (38.3 C), temperature source Axillary, resp. rate (!) 22, height 5\' 1"  (1.549 m), weight 75.1 kg, SpO2 97 %.    Vent Mode: PRVC FiO2 (%):  [30 %] 30 % Set Rate:  [16 bmp] 16 bmp Vt Set:  [380 mL] 380 mL PEEP:  [5 cmH20] 5 cmH20 Pressure Support:  [10 cmH20] 10 cmH20 Plateau Pressure:  [11 cmH20-12 cmH20] 11 cmH20   Intake/Output Summary (Last 24 hours) at 11/30/2017 0905 Last data filed at 11/30/2017 0800 Gross per 24 hour  Intake 3327.37 ml  Output 5310 ml  Net -1982.63 ml   Filed Weights   11/27/17 1400 11/29/17  0500 11/30/17 0546  Weight: 77.2 kg 77.1 kg 75.1 kg    Examination: General: Acutely ill appearing female,  Intubated,  weaning 10/5 HENT: Crani dressing, NO LAD, No JVD, ETT, OGT secure and intact Lungs: Bilateral excursion of the chest, Clear throughout, slightly diminished per bases Cardiovascular: RRR, S1, S2, No RMG, NSR per monitor Abdomen: Soft, NT,  ND and +BS. Body mass index is 31.28 kg/m., honeycomb dressing to abdominal incision CDI Extremities: 1-2+ UE  edema, No obvious deformities noted, Boots on Neuro: Sedation off,  intubated, moves L upper and lower  extremity to pain, does open R eye to  Pain, does not follow commands Skin: Warm and dry, intact, L crani incision CDI, abdominal incision  Resolved Hospital Problem list    Assessment & Plan:  Acute encephalopathy related to Large Left frontal IPH - S/p OR for craniotomy 9/19>> repeat OR 9/20 for crani >> swelling - Remains on 3% saline with sodium at goal>> per neuro P:  Neurology primary Postop per NSGY Strict blood pressure control SBP < 140 with cleviprex  Hourly neuro checks Sodium checks per protocol   Hypertensive crisis, hx HTN Net negative 2.2 L last 24, + 2 L since admission BP within goal range on Cleviprex Triglycerides 277 Good response to Lasix 20 mg  9/21 P:  cleviprex for SBP < 140 Labetalol prn Maintain art line Will discontinue propofol Fentanyl gtt for sedation Trend triglycerides Lasix prn  Acute respiratory insufficiency in the setting of massive IPH Tolerating 10/5 - CXR reviewed 9/22>> Lungs clear no edema/ effusion - Weaning well 9/22  - New fever 9/22>> CXR clear  P : Full MV support 8 cc/kg Wean FiO2/ Peep for sats > 94% SBT as tolerated Continue to trend ABG's prn Minimize  sedation VAP measures  PAD protocol with   ID T Max 101>> Mat be neuro driven but need to RO bacterial source Leukocytosis Plan: Trend fever curve and WBC PCT 9/22 Will send blood, UA, sputum for culture 9/22 Follow Micro CXR prn Tylenol for fever Cooling blanket >> monitor temp Consider [place] for Normothermia  Culture as clinically indicated   Renal: Hypokalemia Hypo phos Hyperchloremia>> down trending Calcium corrects to 8.9 Remains on 3% saline>> Na is 154 on 9/22  P : Trend BMET Check mag and phos Serum NA Q 6 Replete  electrolytes as needed K Phos 20 mmol  Trend UO   Anemia HGB drop to 9.3 post op now 8.2 Positive 4L last 24>> may be hemodilutional component/ Blood loss x 2 surgeries No obvious bleeding Plan: Trend CBC Transfuse fof HGB < 7 Monitor for bleeding  GI  On  Claviprex Propofol discontinued 8/22 TF Plan: Continue TF CBG's Q 4 Add SSI coverage Trend triglycerides SUP Pepcid   Possible ETOH abuse - unclear usage P:  empiric thiamine/ folate  Hx ADHD/ depression / anxiety P:  Will address as mental status improves   Disposition / Summary of Today's Plan 11/30/17   ICU, weaning 10/5  Emergent repeat OR 9/20 for Left MCA territory infarct with evacuated hematoma. Midline shift measures 6 mm after decompressive craniotomy. Repletion of potassium and Phos Re-culture for fevers>> CXR clear Check PCT No significant change in neuro status despite sedation off    Diet: TF Pain/Anxiety/Delirium protocol (if indicated): Propofol and fentanyl VAP protocol (if indicated) indicated DVT prophylaxis: SCDs only  GI prophylaxis: pepcid  Hyperglycemia protocol: CBG q 4; start SSI if CBG > 180 Mobility: BR Code Status: full  Family Communication: Family updated bedside  Labs   CBC: Recent Labs  Lab 11/27/17 0933  11/27/17 1834 11/28/17 1610 11/29/17 0412 11/29/17 1742 11/30/17 0521  WBC 16.4*  --   --  14.0* 15.2* 21.5* 16.8*  HGB 11.8*   < > 9.3* 8.4* 7.7* 8.6* 8.2*  HCT 38.7   < > 30.6* 27.3* 25.6* 27.7* 27.2*  MCV 77.9*  --   --  79.4 81.8 80.5 80.7  PLT 335  --   --  228 212 269 235   < > = values in this interval not displayed.   Basic Metabolic Panel: Recent Labs  Lab 11/27/17 0933 11/27/17 0948 11/27/17 1202  11/28/17 0445 11/28/17 9604 11/28/17 5409  11/29/17 0412 11/29/17 0555 11/29/17 1217 11/29/17 1742 11/29/17 1756 11/29/17 2346 11/30/17 0521  NA 140 138 138   < > 141 144 144   < > 153* 155* 152*  --  154* 155* 154*  156*  K 3.0* 2.9* 2.9*   --  4.1 3.7  --   --  3.8  --   --   --   --   --  3.0*  CL 98 100  --   --  112* 116*  --   --  126*  --   --   --   --   --  122*  CO2 25  --   --   --  18* 19*  --   --  21*  --   --   --   --   --  25  GLUCOSE 144* 141*  --   --  172* 150*  --   --  144*  --   --   --   --   --  141*  BUN 11 12  --   --  10 8  --   --  10  --   --   --   --   --  13  CREATININE 1.04* 0.80  --   --  0.95 0.88  --   --  0.73  --   --   --   --   --  0.63  CALCIUM 9.2  --   --   --  7.4* 7.5*  --   --  7.6*  --   --   --   --   --  7.9*  MG  --   --   --    < >  --  1.7 1.7  --  2.1  --   --  2.1  --   --  2.1  PHOS  --   --   --   --  2.0* 1.3*  --   --  1.2*  --   --  <1.0*  --   --  1.2*   < > = values in this interval not displayed.   GFR: Estimated Creatinine Clearance: 84.9 mL/min (by C-G formula based on SCr of 0.63 mg/dL). Recent Labs  Lab 11/27/17 0949 11/27/17 1552 11/28/17 0832 11/29/17 0412 11/29/17 1524 11/29/17 1742 11/30/17 0521  PROCALCITON  --  <0.10  --   --   --   --   --   WBC  --   --  14.0* 15.2*  --  21.5* 16.8*  LATICACIDVEN 4.07* 4.3*  --   --  1.9  --  1.3   Liver Function Tests: Recent Labs  Lab 11/27/17 0933 11/28/17 0445 11/29/17 8119  11/30/17 0521  AST 24  --  24 30  ALT 15  --  12 15  ALKPHOS 83  --  53 66  BILITOT 1.2  --  0.4 0.6  PROT 7.6  --  5.4* 6.2*  ALBUMIN 3.8 2.7* 2.4* 2.2*   No results for input(s): LIPASE, AMYLASE in the last 168 hours. No results for input(s): AMMONIA in the last 168 hours. ABG    Component Value Date/Time   PHART 7.335 (L) 11/29/2017 0310   PCO2ART 36.6 11/29/2017 0310   PO2ART 86.0 11/29/2017 0310   HCO3 19.5 (L) 11/29/2017 0310   TCO2 21 (L) 11/29/2017 0310   ACIDBASEDEF 6.0 (H) 11/29/2017 0310   O2SAT 96.0 11/29/2017 0310    Coagulation Profile: Recent Labs  Lab 11/27/17 0933  INR 1.06   Cardiac Enzymes: No results for input(s): CKTOTAL, CKMB, CKMBINDEX, TROPONINI in the last 168 hours. HbA1C: Hgb A1c MFr  Bld  Date/Time Value Ref Range Status  11/28/2017 08:32 AM 5.8 (H) 4.8 - 5.6 % Final    Comment:    (NOTE) Pre diabetes:          5.7%-6.4% Diabetes:              >6.4% Glycemic control for   <7.0% adults with diabetes    CBG: Recent Labs  Lab 11/29/17 1648 11/29/17 1911 11/29/17 2315 11/30/17 0317 11/30/17 0750  GLUCAP 122* 176* 138* 120* 125*    Admitting History of Present Illness.   HPI obtained from medical chart review as patient is intubated and sedated on MV.   42 year old female with PMH of HTN, depression, anxiety, and ADHD who presented from home with altered mental status.  Patient lives with her 30 year old daughter and was apparently unable to be awakened yesterday evening.  Daughter called patient's boyfriend who thought she was possibly intoxicated.  This morning, patient still could not be awakened and therefore EMS was called by boyfriend.  On arrival to ER, she was nonverbal and minimally responsive to pain and only moving left side with decerebrate posturing on right and hypertensive requiring labetalol and cleviprex for blood pressure control.  CT head shows large intraparenchymal bleed with midline shift to left.  CTA without evidence of aneurysm or AVM.  She was intubated for airway protection.  Mannitol given.  Neurosurgery consulted and patient taken to OR for craniotomy.  PCCM consulted for ventilator and medical management.   Review of Systems:   Unable to complete as patient is sedated on mechanical ventilation .   Past medical history  She,  has a past medical history of ADHD, Anxiety, Depression, and Hypertension.     Surgical History    9/19>> craniotomy evacuation of clot 9/20 >> Decompressive craniectomy with ( L)  with skull harvesting in the abdomen  Social History   Social History   Socioeconomic History  . Marital status: Married    Spouse name: Not on file  . Number of children: 1  . Years of education: Not on file  . Highest  education level: Not on file  Occupational History  . Not on file  Social Needs  . Financial resource strain: Not on file  . Food insecurity:    Worry: Not on file    Inability: Not on file  . Transportation needs:    Medical: Not on file    Non-medical: Not on file  Tobacco Use  . Smoking status: Not on file  Substance and Sexual  Activity  . Alcohol use: Yes  . Drug use: Not on file  . Sexual activity: Yes  Lifestyle  . Physical activity:    Days per week: Not on file    Minutes per session: Not on file  . Stress: Not on file  Relationships  . Social connections:    Talks on phone: Not on file    Gets together: Not on file    Attends religious service: Not on file    Active member of club or organization: Not on file    Attends meetings of clubs or organizations: Not on file    Relationship status: Not on file  . Intimate partner violence:    Fear of current or ex partner: Not on file    Emotionally abused: Not on file    Physically abused: Not on file    Forced sexual activity: Not on file  Other Topics Concern  . Not on file  Social History Narrative  . Not on file  ,  reports that she drinks alcohol.   Family history   Her family history includes Hypertension in her father and mother.   Allergies No Known Allergies  Home meds  Prior to Admission medications   Not on File    9/22>> No family at bedside   Bevelyn NgoSarah F. Groce, AGACNP-BC [facility]. Pager: [phone]  11/30/2017  9:05 AM

## 2017-11-30 NOTE — Progress Notes (Signed)
NEUROHOSPITALISTS STROKE TEAM - DAILY PROGRESS NOTE   SUBJECTIVE No family is at bedside. Patient is still intubated and on fentanyl in the ICU.  Eyes open spontaneously, but not following commands. On pain, she is able to move LUE against gravity but still has right hemiplegia. Na 154. Still on cleviprex.   OBJECTIVE Most recent Vital Signs: Vitals:   11/30/17 0546 11/30/17 0600 11/30/17 0630 11/30/17 0700  BP:  140/64 (!) 143/74 (!) 144/73  Pulse:  97 100 (!) 104  Resp:  19 (!) 24 (!) 24  Temp:      TempSrc:      SpO2:  96% 95% 95%  Weight: 75.1 kg     Height: 5\' 1"  (1.549 m)      CBG (last 3)  Recent Labs    11/29/17 1911 11/29/17 2315 11/30/17 0317  GLUCAP 176* 138* 120*    Physical Exam  Temp:  [98 F (36.7 C)-101 F (38.3 C)] 98.7 F (37.1 C) (09/22 0400) Pulse Rate:  [75-108] 104 (09/22 0700) Resp:  [15-29] 24 (09/22 0700) BP: (132-189)/(64-88) 144/73 (09/22 0700) SpO2:  [93 %-98 %] 95 % (09/22 0700) Arterial Line BP: (159-193)/(66-161) 164/161 (09/21 1600) FiO2 (%):  [30 %] 30 % (09/22 0400) Weight:  [75.1 kg] 75.1 kg (09/22 0546)   HEENT-status post craniotomy with dressing clean dry and intact  cardiovascular- S1-S2 audible, pulses palpable throughout   Lungs-and breath sounds Abdomen-hypoactive bowel sounds  musculoskeletal-no joint tenderness, deformity or swelling Skin-warm and dry Neuro - intubated on low dose sedation, barely open eyes on pain stimulation, b/l eyelid swollen, PERRL, eye midposition, sluggish doll's eye. Not blinking to visual threat, not tracking. Left corneal positive, and right absent. Gag positive. Facial symmetry not able to test due to ET. On pain stimulation, LUE localized to pain and able to against gravity. LLE withdraw to pain and 3-/5. RUE and RLE flaccid. B/l babinski positive. Sensation, coordination and gait not tested.    Lipid Panel    Component Value Date/Time   CHOL 138 11/28/2017 0832   TRIG 277 (H) 11/30/2017 0521   HDL 42 11/28/2017 0832   CHOLHDL 3.3 11/28/2017 0832   VLDL 32 11/28/2017 0832   LDLCALC 64 11/28/2017 0832   HgbA1C  Lab Results  Component Value Date   HGBA1C 5.8 (H) 11/28/2017    Urine Drug Screen:      Component Value Date/Time   LABOPIA NONE DETECTED 11/27/2017 1415   COCAINSCRNUR NONE DETECTED 11/27/2017 1415   LABBENZ NONE DETECTED 11/27/2017 1415   AMPHETMU POSITIVE (A) 11/27/2017 1415   THCU NONE DETECTED 11/27/2017 1415   LABBARB NONE DETECTED 11/27/2017 1415    Alcohol Level:  Recent Labs  Lab 11/27/17 0948  ETH <10    Ct Angio Head W Or Wo Contrast  IMPRESSION: 1. Positive for left MCA M1 large vessel occlusion, and also severe stenosis at the left ICA terminus, superimposed on the relatively large acute left hemisphere intra-axial hemorrhage (estimated blood volume 43 mL). Vasospasm suspected in the proximal ACAs. 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 2. Negative for intracranial aneurysm, CTA spot sign, or evidence of vascular  malformation in association with the acute hemorrhage. 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 3. Stable intracranial mass effect since 0938 hours today, with 16 mm of leftward midline shift and trapping of the right lateral ventricle.   Ct Head Wo Contrast  11/28/2017 IMPRESSION: 1. Stable when compared to yesterday. 2. Left MCA territory infarct with evacuated hematoma. Midline shift measures 8 mm. 3. 2-3 mm subdural hematoma along the right cerebral convexity without interval increase.   Ct Head Wo Contrast  11/27/2017 IMPRESSION: 1. Interval LEFT frontal craniotomy for basal ganglia hematoma evacuation, small amount of residual blood products and regional edema. 7 mm residual LEFT to  RIGHT midline shift, resolved RIGHT ventricular entrapment. 2. Trace RIGHT holo hemispheric and tentorial subdural hematomas. Global edema.   Ct Head Wo Contrast 11/27/2017 IMPRESSION: Large area of hemorrhage in the left basal ganglia and left frontal lobe, 5.8 x 3.5 cm. 15 mm of left-to-right midline shift.  Mr Brain Wo Contrast 11/28/2017 IMPRESSION: 1. Postoperative changes from previous left frontal craniotomy for evacuation of left basal ganglia hematoma. Residual blood products within the evacuation cavity with persistent surrounding vasogenic edema and regional mass effect. Associated 8-9 mm of right-to-left midline shift with mild left uncal herniation similar to previous. No hydrocephalus or ventricular trapping. 2. Underlying evolving large acute ischemic left MCA territory infarct involving the majority of the left MCA territory. 3. Trace right cerebral and tentorial subdural hematoma without mass effect. 4. Right sphenoid sinusitis.  Ct Head Wo Contrast  Result Date: 11/29/2017 CLINICAL DATA:  42 y/o  F; stroke for follow-up. EXAM: CT HEAD WITHOUT CONTRAST TECHNIQUE: Contiguous axial images were obtained from the base of the skull through the vertex without intravenous contrast. COMPARISON:  11/27/2017 MRI head.  11/28/2017 CT head. FINDINGS: Brain: Large left MCA infarction is stable in distribution. Interval left frontal craniectomy with partial herniation of the left frontal lobe through the craniectomy defect. Interval resolution of uncal herniation, improved patency of the left lateral ventricle, and 6 mm left-to-right midline shift, previously 8 mm. Partial interval dispersion of acute hemorrhage in the left basal ganglia post partial evacuation. There is a subcentimeter density anterior to the prior position of blood products possibly representing retracted clot or interval hemorrhage (series 3, image 19), attention at follow-up recommended. Vascular: No hyperdense vessel or unexpected  calcification. Skull: Interval left frontal craniectomy with edema and several foci of air in the overlying scalp as well as skin staples. Sinuses/Orbits: Possible right sphenoid sinus opacification with aerosolized secretions. Normal aeration of the mastoid air cells. Other: None. IMPRESSION: 1. Large left MCA infarct is stable in distribution. 2. Interval left frontal craniectomy with mild herniation of left frontal lobe through the defect. 3. Resolved uncal herniation, improved patency of left lateral ventricle, and 6 mm left-to-right midline shift, previously 8 mm. 4. Partial interval dispersion of acute hemorrhage in the left basal ganglia. Subcentimeter density anterior to the prior position of blood products possibly representing retracted clot or interval hemorrhage, attention at follow-up recommended. Electronically Signed   By: Mitzi Hansen M.D.   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 15 mm of left-to-right midline shift.  MRI of the brain: Post craniotomy 8-9 mm of right-to-left midline shift with mild left uncal herniation similar to previous. Underlying evolving large acute ischemic left MCA territory infarct involving the majority of the left MCA territory.  Repeat Head CT post evacuation 9/20 - Left MCA territory infarct with evacuated hematoma. Midline shift measures 8 mm.   2D Echocardiogram EF 60 to 65%  LE venous doppler negative for DVT  Hypercoagulable labs pending  Consider TEE  once improved and stable  LDL 64  HgbA1c 5.8  VTE: Heparin subq  Antithrombotic: no anti-coagulation secondary to intracranial hemorrhage  Continue Rehab with PT consult, OT consult, Speech consult  Therapy recommendations: Pending  Disposition: Pending  Cerebral edema  CT head midline shift 15mm with uncal herniation  Repeat CT head s/p hematoma evacuation - midline shift 8mm  on 3% hypertonic saline for cerebral edema   Na goal between 150-155.    Na 153->155-> 154  S/p hemicrani 9/20  Repeat CT 9/21 - midline shift 6mm  On keppra for seizure prevention   Respiratory failure  Intubated on sedation  CCM on board  Continue on vent  Wean off vent as able   Hypertension emergency  SBP persistently elevated  BP goal less than 160   on a Cleviprex drip  On norvasc and lisinopril  Added labetalol 100 3 times daily  Wean off cleviprex as able   Anemia  Hb 11.8 on admission  Hemoglobin 8.4 -> 7.7-> 8.2  Likely due to acute blood loss with surgery   Continue to monitor closely  PRBC transfusion if Hb , 7.0   Other Stroke Risk Factors  ETOH use   Other Active Problems  ADHD - on Adderell at home  Anxiety  Depression  Hypokalemia, supplement  Leukocytosis WBC 21.5-16.8  Hospital day # 3  This patient is critically ill due to large left MCA infarct, uncal herniation s/p hemicrani, large hemorrhagic conversion, intubated and at significant risk of neurological worsening, death form stroke recurrence, hematoma expansion, cerebral edema and uncal herniation, seizure. This patient's care requires constant monitoring of vital signs, hemodynamics, respiratory and cardiac monitoring, review of multiple databases, neurological assessment, discussion with family, other specialists and medical decision making of high complexity. I spent 40 minutes of neurocritical care time in the care of this patient.  Marvel Plan, MD PhD Stroke  Neurology 11/30/2017 12:18 PM     To contact Stroke Continuity provider, please refer to [email or web address]. After hours, contact General Neurology

## 2017-12-01 ENCOUNTER — Encounter (HOSPITAL_COMMUNITY): Payer: Self-pay | Admitting: Neurosurgery

## 2017-12-01 ENCOUNTER — Inpatient Hospital Stay (HOSPITAL_COMMUNITY): Payer: Medicaid Other

## 2017-12-01 DIAGNOSIS — J9600 Acute respiratory failure, unspecified whether with hypoxia or hypercapnia: Secondary | ICD-10-CM

## 2017-12-01 DIAGNOSIS — E785 Hyperlipidemia, unspecified: Secondary | ICD-10-CM

## 2017-12-01 LAB — CBC
HCT: 26.2 % — ABNORMAL LOW (ref 36.0–46.0)
Hemoglobin: 8.1 g/dL — ABNORMAL LOW (ref 12.0–15.0)
MCH: 24.6 pg — ABNORMAL LOW (ref 26.0–34.0)
MCHC: 30.9 g/dL (ref 30.0–36.0)
MCV: 79.6 fL (ref 78.0–100.0)
PLATELETS: 280 10*3/uL (ref 150–400)
RBC: 3.29 MIL/uL — AB (ref 3.87–5.11)
RDW: 19.5 % — ABNORMAL HIGH (ref 11.5–15.5)
WBC: 14.4 10*3/uL — AB (ref 4.0–10.5)

## 2017-12-01 LAB — COMPREHENSIVE METABOLIC PANEL
ALBUMIN: 2.1 g/dL — AB (ref 3.5–5.0)
ALT: 38 U/L (ref 0–44)
AST: 60 U/L — AB (ref 15–41)
Alkaline Phosphatase: 104 U/L (ref 38–126)
Anion gap: 11 (ref 5–15)
BUN: 19 mg/dL (ref 6–20)
CHLORIDE: 125 mmol/L — AB (ref 98–111)
CO2: 23 mmol/L (ref 22–32)
CREATININE: 0.76 mg/dL (ref 0.44–1.00)
Calcium: 7.9 mg/dL — ABNORMAL LOW (ref 8.9–10.3)
GFR calc Af Amer: >60 mL/min (ref >60)
Glucose, Bld: 134 mg/dL — ABNORMAL HIGH (ref 70–99)
Potassium: 3.3 mmol/L — ABNORMAL LOW (ref 3.5–5.1)
SODIUM: 159 mmol/L — AB (ref 135–145)
Total Bilirubin: 0.6 mg/dL (ref 0.3–1.2)
Total Protein: 6 g/dL — ABNORMAL LOW (ref 6.5–8.1)

## 2017-12-01 LAB — SODIUM
SODIUM: 160 mmol/L — AB (ref 135–145)
Sodium: 156 mmol/L — ABNORMAL HIGH (ref 135–145)
Sodium: 160 mmol/L — ABNORMAL HIGH (ref 135–145)

## 2017-12-01 LAB — GLUCOSE, CAPILLARY
GLUCOSE-CAPILLARY: 116 mg/dL — AB (ref 70–99)
GLUCOSE-CAPILLARY: 112 mg/dL — AB (ref 70–99)
GLUCOSE-CAPILLARY: 132 mg/dL — AB (ref 70–99)
GLUCOSE-CAPILLARY: 133 mg/dL — AB (ref 70–99)
Glucose-Capillary: 148 mg/dL — ABNORMAL HIGH (ref 70–99)
Glucose-Capillary: 144 mg/dL — ABNORMAL HIGH (ref 70–99)

## 2017-12-01 LAB — CARDIOLIPIN ANTIBODIES, IGG, IGM, IGA: Anticardiolipin IgG: <9 GPL U/mL (ref 0–14)

## 2017-12-01 LAB — PHOSPHORUS: Phosphorus: 2.8 mg/dL (ref 2.5–4.6)

## 2017-12-01 LAB — ANTINUCLEAR ANTIBODIES, IFA: ANA Ab, IFA: NEGATIVE

## 2017-12-01 LAB — ANTI-DNA ANTIBODY, DOUBLE-STRANDED: ds DNA Ab: 1 IU/mL (ref 0–9)

## 2017-12-01 LAB — PROCALCITONIN: Procalcitonin: 0.14 ng/mL

## 2017-12-01 LAB — MAGNESIUM: Magnesium: 2.1 mg/dL (ref 1.7–2.4)

## 2017-12-01 LAB — HOMOCYSTEINE: HOMOCYSTEINE-NORM: 8.3 umol/L (ref 0.0–15.0)

## 2017-12-01 MED ORDER — NICARDIPINE HCL IN NACL 40-0.83 MG/200ML-% IV SOLN
3.0000 mg/h | INTRAVENOUS | Status: DC
  Administered 2017-12-01 (×2): 12.5 mg/h via INTRAVENOUS
  Administered 2017-12-01: 10 mg/h via INTRAVENOUS
  Administered 2017-12-01 – 2017-12-02 (×2): 5 mg/h via INTRAVENOUS
  Filled 2017-12-01: qty 400
  Filled 2017-12-01 (×2): qty 200
  Filled 2017-12-01 (×2): qty 400

## 2017-12-01 MED ORDER — LABETALOL HCL 100 MG PO TABS
300.0000 mg | ORAL_TABLET | Freq: Three times a day (TID) | ORAL | Status: DC
  Administered 2017-12-01 – 2017-12-09 (×24): 300 mg via ORAL
  Filled 2017-12-01 (×25): qty 3

## 2017-12-01 MED ORDER — POTASSIUM CHLORIDE 20 MEQ/15ML (10%) PO SOLN
40.0000 meq | Freq: Once | ORAL | Status: AC
  Administered 2017-12-01: 40 meq
  Filled 2017-12-01: qty 30

## 2017-12-01 MED ORDER — DOCUSATE SODIUM 50 MG/5ML PO LIQD
100.0000 mg | Freq: Two times a day (BID) | ORAL | Status: DC
  Administered 2017-12-01 – 2018-01-08 (×64): 100 mg
  Filled 2017-12-01 (×68): qty 10

## 2017-12-01 MED FILL — Thrombin (Recombinant) For Soln 20000 Unit: CUTANEOUS | Qty: 1 | Status: AC

## 2017-12-01 NOTE — Progress Notes (Signed)
NEUROHOSPITALISTS STROKE TEAM - DAILY PROGRESS NOTE   SUBJECTIVE No family is at bedside. Patient is still intubated and on fentanyl in the ICU.  Eyes closed but able to open on pain stimulation, but not following commands. Still has right hemiplegia. Na 156, off 3%.  BP still high, transition from Cleviprex to double strength Cardene due to high triglyceride.   OBJECTIVE Most recent Vital Signs: Vitals:   12/01/17 1245 12/01/17 1300 12/01/17 1315 12/01/17 1330  BP: (!) 159/77 (!) 152/67 (!) 153/67 (!) 146/67  Pulse: 94 94 95 97  Resp: (!) 23 (!) 24 (!) 23 (!) 21  Temp:      TempSrc:      SpO2: 98% 98% 98% 98%  Weight:      Height:       CBG (last 3)  Recent Labs    12/01/17 0422 12/01/17 0747 12/01/17 1203  GLUCAP 116* 148* 144*    Physical Exam  Temp:  [97.4 F (36.3 C)-102.3 F (39.1 C)] 100.1 F (37.8 C) (09/23 1230) Pulse Rate:  [76-106] 97 (09/23 1330) Resp:  [14-25] 21 (09/23 1330) BP: (136-175)/(67-99) 146/67 (09/23 1330) SpO2:  [94 %-100 %] 98 % (09/23 1330) FiO2 (%):  [30 %] 30 % (09/23 1125) Weight:  [75.8 kg] 75.8 kg (09/23 0500)   HEENT-status post craniotomy with dressing clean dry and intact  cardiovascular- S1-S2 audible, pulses palpable throughout   Lungs-and breath sounds Abdomen-hypoactive bowel sounds  musculoskeletal-no joint tenderness, deformity or swelling Skin-warm and dry Neuro - intubated on low dose sedation, barely open eyes on pain stimulation, b/l eyelid swollen, PERRL, eye midposition, sluggish doll's eye. Not blinking to visual threat, not tracking. Left corneal positive, and right absent. Gag positive. Facial symmetry not able to test due to ET. On pain stimulation, LUE localized to pain and able to against gravity. LLE withdraw to pain and 3-/5. RUE and RLE flaccid. B/l babinski positive. Sensation, coordination and gait not tested.    Lipid Panel    Component Value Date/Time     CHOL 138 11/28/2017 0832   TRIG 414 (H) 11/30/2017 1658   HDL 42 11/28/2017 0832   CHOLHDL 3.3 11/28/2017 0832   VLDL 32 11/28/2017 0832   LDLCALC 64 11/28/2017 0832   HgbA1C  Lab Results  Component Value Date   HGBA1C 5.8 (H) 11/28/2017    Urine Drug Screen:      Component Value Date/Time   LABOPIA NONE DETECTED 11/27/2017 1415   COCAINSCRNUR NONE DETECTED 11/27/2017 1415   LABBENZ NONE DETECTED 11/27/2017 1415   AMPHETMU POSITIVE (A) 11/27/2017 1415   THCU NONE DETECTED 11/27/2017 1415   LABBARB NONE DETECTED 11/27/2017 1415    Alcohol Level:  Recent Labs  Lab 11/27/17 0948  ETH <10    Ct Angio Head W Or Wo Contrast  IMPRESSION: 1. Positive for left MCA M1 large vessel occlusion, and also severe stenosis at the left ICA terminus, superimposed on the relatively large acute left hemisphere intra-axial hemorrhage (estimated blood volume 43 mL). Vasospasm suspected in the proximal ACAs. 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 2. Negative for intracranial aneurysm, CTA spot sign, or  evidence of vascular malformation in association with the acute hemorrhage. 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 3. Stable intracranial mass effect since 0938 hours today, with 16 mm of leftward midline shift and trapping of the right lateral ventricle.   Ct Head Wo Contrast  11/28/2017 IMPRESSION: 1. Stable when compared to yesterday. 2. Left MCA territory infarct with evacuated hematoma. Midline shift measures 8 mm. 3. 2-3 mm subdural hematoma along the right cerebral convexity without interval increase.   Ct Head Wo Contrast  11/27/2017 IMPRESSION: 1. Interval LEFT frontal craniotomy for basal ganglia hematoma evacuation, small amount of residual blood products and regional edema. 7 mm residual LEFT to  RIGHT midline shift, resolved RIGHT ventricular entrapment. 2. Trace RIGHT holo hemispheric and tentorial subdural hematomas. Global edema.   Ct Head Wo Contrast 11/27/2017 IMPRESSION: Large area of hemorrhage in the left basal ganglia and left frontal lobe, 5.8 x 3.5 cm. 15 mm of left-to-right midline shift.  Mr Brain Wo Contrast 11/28/2017 IMPRESSION: 1. Postoperative changes from previous left frontal craniotomy for evacuation of left basal ganglia hematoma. Residual blood products within the evacuation cavity with persistent surrounding vasogenic edema and regional mass effect. Associated 8-9 mm of right-to-left midline shift with mild left uncal herniation similar to previous. No hydrocephalus or ventricular trapping. 2. Underlying evolving large acute ischemic left MCA territory infarct involving the majority of the left MCA territory. 3. Trace right cerebral and tentorial subdural hematoma without mass effect. 4. Right sphenoid sinusitis.  Ct Head Wo Contrast  Result Date: 11/29/2017 CLINICAL DATA:  42 y/o  F; stroke for follow-up. EXAM: CT HEAD WITHOUT CONTRAST TECHNIQUE: Contiguous axial images were obtained from the base of the skull through the vertex without intravenous contrast. COMPARISON:  11/27/2017 MRI head.  11/28/2017 CT head. FINDINGS: Brain: Large left MCA infarction is stable in distribution. Interval left frontal craniectomy with partial herniation of the left frontal lobe through the craniectomy defect. Interval resolution of uncal herniation, improved patency of the left lateral ventricle, and 6 mm left-to-right midline shift, previously 8 mm. Partial interval dispersion of acute hemorrhage in the left basal ganglia post partial evacuation. There is a subcentimeter density anterior to the prior position of blood products possibly representing retracted clot or interval hemorrhage (series 3, image 19), attention at follow-up recommended. Vascular: No hyperdense vessel or unexpected  calcification. Skull: Interval left frontal craniectomy with edema and several foci of air in the overlying scalp as well as skin staples. Sinuses/Orbits: Possible right sphenoid sinus opacification with aerosolized secretions. Normal aeration of the mastoid air cells. Other: None. IMPRESSION: 1. Large left MCA infarct is stable in distribution. 2. Interval left frontal craniectomy with mild herniation of left frontal lobe through the defect. 3. Resolved uncal herniation, improved patency of left lateral ventricle, and 6 mm left-to-right midline shift, previously 8 mm. 4. Partial interval dispersion of acute hemorrhage in the left basal ganglia. Subcentimeter density anterior to the prior position of blood products possibly representing retracted clot or interval hemorrhage, attention at follow-up recommended. Electronically Signed   By: Mitzi Hansen M.D.   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 15 mm of left-to-right midline shift.  MRI of the brain: Post craniotomy 8-9 mm of right-to-left midline shift with mild left uncal herniation similar to previous. Underlying evolving large acute ischemic left MCA territory infarct involving the majority of the left MCA territory.  Repeat Head CT post evacuation 9/20 - Left MCA territory infarct with evacuated hematoma. Midline shift measures 8 mm.   2D Echocardiogram EF 60 to 65%  LE venous doppler negative for DVT  Hypercoagulable labs negative  ANA, rest pending  Consider TEE once improved and stable  LDL 64  HgbA1c 5.8  VTE: Heparin subq  Antithrombotic: no anti-coagulation secondary to intracranial hemorrhage  Continue Rehab with PT consult, OT consult, Speech consult  Therapy recommendations: Pending  Disposition: Pending  Cerebral edema  CT head midline shift 15mm with uncal herniation  Repeat CT head s/p hematoma evacuation - midline shift 8mm  3% saline on hold  Na goal between 150-155.    Na 153->155-> 154->159->156  S/p hemicrani 9/20  Repeat CT 9/21 - midline shift 6mm  On keppra for seizure prevention  CT repeat in a.m.   Respiratory failure  Intubated on sedation  CCM on board  Continue on vent  Wean off vent as able   Hypertension emergency  SBP persistently elevated  BP goal <160   on a Cleviprex drip -switch to double strength Cardene due to high TG  On norvasc and lisinopril  Increase labetalol to 300mg  3 times daily   Anemia  Hb 11.8 on admission  Hemoglobin 8.4 -> 7.7-> 8.2-> 8.1  Likely due to acute blood loss with surgery   Continue to monitor  PRBC transfusion if Hb , 7.0   Other Stroke Risk Factors  ETOH use   Other Active Problems  ADHD - on Adderell at home  Anxiety  Depression  Hypokalemia, supplement  Leukocytosis WBC 21.5-16.8-> 14.4  Hospital day # 4  This patient is critically ill due to large left MCA infarct, uncal herniation s/p hemicrani, large hemorrhagic conversion, intubated and at significant risk of neurological worsening, death form stroke recurrence, hematoma expansion, cerebral edema and uncal herniation, seizure. This patient's care requires constant monitoring of vital signs, hemodynamics, respiratory and cardiac monitoring, review of multiple databases, neurological assessment, discussion with family, other specialists and medical decision making of high complexity. I spent 35 minutes of neurocritical care time in the care of  this patient.  Marvel PlanJindong Stephanny Tsutsui, MD PhD Stroke Neurology 12/01/2017 2:12 PM     To contact Stroke Continuity provider, please refer to [email or web address]. After hours, contact General Neurology

## 2017-12-01 NOTE — Progress Notes (Signed)
Clinical Social Worker acknowledge consult to assist medical team in finding a decision maker  on behalf of patient.   Marrianne MoodAshley Okley Magnussen, MSW,  [employer] IncLCSWA [phone]

## 2017-12-01 NOTE — Progress Notes (Signed)
Nutrition Follow-up  DOCUMENTATION CODES:   Obesity unspecified  INTERVENTION:   Continue:  Vital High Protein @ 30 ml/hr (720 ml/day) via OG tube 30 ml Prostat TID MVI daily  Provides: 1020 kcal, 108 grams protein, and 630 ml free water.   NUTRITION DIAGNOSIS:   Inadequate oral intake related to inability to eat as evidenced by NPO status. Ongoing.   GOAL:   Provide needs based on ASPEN/SCCM guidelines Met.   MONITOR:   TF tolerance, Vent status  ASSESSMENT:   Pt with PMH of HTN with prescribed stimulant use admitted with large L hemorrhagic basal ganglia with 15 mm shift with early herniation s/p L frontotemporal craniotomy for evacuation of hematoma.   Pt discussed during ICU rounds and with RN.  9/20 OG advanced, tip in stomach  Patient is currently intubated on ventilator support Temp (24hrs), Avg:99.9 F (37.7 C), Min:97.4 F (36.3 C), Max:102.3 F (39.1 C)   Propofol: off 8/22 Cleviprex - stopped this am and started on cardene  Medications reviewed and include: colace BID, folic acid, 2-6 units novolog every 4 hours, MVI, senokot-s daily, thiamine, 3% hypertonic saline  Labs reviewed: Na 159 (H), K+ 3.3 (L) BP: 140/79 MAP: 99   I/O: +2987 ml since admit UOP: 2780 ml x 24 hrs   NUTRITION - FOCUSED PHYSICAL EXAM:    Most Recent Value  Orbital Region  No depletion  Upper Arm Region  No depletion  Thoracic and Lumbar Region  No depletion  Buccal Region  No depletion  Temple Region  No depletion  Clavicle Bone Region  No depletion  Clavicle and Acromion Bone Region  No depletion  Scapular Bone Region  Unable to assess  Dorsal Hand  No depletion  Patellar Region  No depletion  Anterior Thigh Region  No depletion  Posterior Calf Region  No depletion  Edema (RD Assessment)  Mild  Hair  Reviewed  Eyes  Unable to assess  Mouth  Unable to assess  Skin  Reviewed  Nails  Reviewed       Diet Order:   Diet Order            Diet NPO time  specified  Diet effective now              EDUCATION NEEDS:   No education needs have been identified at this time  Skin:  Skin Assessment: Reviewed RN Assessment(L head incision)  Last BM:  unknown, on medications  Height:   Ht Readings from Last 1 Encounters:  12/01/17 5' 1" (1.549 m)    Weight:   Wt Readings from Last 1 Encounters:  12/01/17 75.8 kg    Ideal Body Weight:  47.7 kg  BMI:  Body mass index is 31.57 kg/m.  Estimated Nutritional Needs:   Kcal:  900-1100  Protein:  > 95 grams   Fluid:  > 1.5 L  Heather Pitts RD, LDN, CNSC [phone] Pager [phone] After Hours Pager  

## 2017-12-01 NOTE — Progress Notes (Signed)
0000 Na level came back as 160, 3% NS stopped per order

## 2017-12-01 NOTE — Progress Notes (Signed)
OT Cancellation Note  Patient Details Name: Tammie Miles MRN: [id number] DOB: [birth year]   Cancelled Treatment:    Reason Eval/Treat Not Completed: Active bedrest order(Intubated. )  Tammie Miles,Tammie Miles  Tammie Miles, OT/L   Acute OT Clinical Specialist Acute Rehabilitation Services Pager [phone] Office [phone]  12/01/2017, 3:29 PM

## 2017-12-01 NOTE — Progress Notes (Signed)
NAME:  Tammie Miles, MRN:  [id number], DOB:  11/20/1975, LOS: 4 ADMISSION DATE:  11/27/2017, CONSULTATION DATE:  11/27/2017 REFERRING MD:  Dr. Conchita Paris CHIEF COMPLAINT:  Left frontal hemorrhage  Brief History   53 yoF with AMS since 9/18 evening, thought due to intoxication. Still unresponsive this am.  Patient was brought to the ED where a CT was done and showed a large left sided ICH.  Patient was taken to the OR and a mini craniectomy was done.  Patient was brought out to the ICU and PCCM was consulted for vent management.    Significant Hospital Events   9/19 Admit/ OR- mini crani 9/20>> Decompressive craniectomy ( L)  with skull harvesting in the abdomen  Consults: date of consult/date signed off & final recs:  9/19 NSGY 9/19 PCCM   Procedures (surgical and bedside):  9/19 Intubated 9/19 OR for crani, evac of hematoma>> no change in neuro status post op 9/20 Decompressive craniectomy ( L)  with skull harvesting in the abdomen  Significant Diagnostic Tests:  11/29/2017 CT Head Large left MCA infarct is stable in distribution. Interval left frontal craniectomy with mild herniation of left frontal lobe through the defect. Resolved uncal herniation, improved patency of left lateral ventricle, and 6 mm left-to-right midline shift, previously 8 mm. Partial interval dispersion of acute hemorrhage in the left basal ganglia. Subcentimeter density anterior to the prior position of blood products possibly representing retracted clot or interval hemorrhage, attention at follow-up recommended.  9/19 CTH  Large area of hemorrhage in the left basal ganglia and left frontal lobe, 5.8 x 3.5 cm. 15 mm of left-to-right midline shift.  9/19 CTA head 1. Positive for left MCA M1 large vessel occlusion, and also severe stenosis at the left ICA terminus, superimposed on the relatively large acute left hemisphere intra-axial hemorrhage (estimated blood volume 43 mL). Vasospasm suspected in the  proximal ACAs. 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 2. Negative for intracranial aneurysm, CTA spot sign, or evidence of vascular malformation in association with the acute hemorrhage. 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 3. Stable intracranial mass effect since 0938 hours today, with 16 mm of leftward midline shift and trapping of the right lateral ventricle.  [facility] 9/21 >> 1. Large left MCA infarct is stable in distribution. 2. Interval left frontal craniectomy with mild herniation of left frontal lobe through the defect. 3. Resolved uncal herniation, improved patency of left lateral ventricle, and 6 mm left-to-right midline shift, previously 8 mm. 4. Partial interval dispersion of acute hemorrhage in the left basal ganglia. Subcentimeter density anterior to the prior position of blood products possibly representing retracted clot or interval hemorrhage, attention at follow-up recommended.  Micro Data: 9/19 MRSA PCR >> Negative 9/19 Sputum Culture>> normal flora 9/22 Blood x 2>> 9/22 UA>> Normal 9/22>> Sputum>>  Antimicrobials:  9/19 Cefazolin (pre-op); 9/20  Subjective:  tmax 101.4 overnight, PCT 0.14, WBC 16.8 ->14.4 Net +2.9L  Fentanyl down to 75 mcg/hr from 200 mcg/hr without significant change in mental status Cleviprex at max 21mg /hr, barely below SBP goal  3% held at midnight, Na at midnight  Weaning 10/5 since 0700 Fever improved on cooling blanket  Objective   Blood pressure (!) 154/91, pulse 81, temperature 98.8 F (37.1 C), temperature source Rectal, resp. rate 14, height 5\' 1"  (1.549 m), weight 75.8 kg, SpO2 95 %.  Vent Mode: PSV;CPAP FiO2 (%):  [30 %] 30 % Set Rate:  [16 bmp] 16 bmp Vt Set:  [380 mL] 380 mL PEEP:  [5 cmH20] 5 cmH20 Pressure Support:  [10 cmH20]  10 cmH20   Intake/Output Summary (Last 24 hours) at 12/01/2017 0857 Last data filed at 12/01/2017 0700 Gross per 24 hour  Intake 2516.63 ml  Output 2430 ml  Net 86.63 ml   Filed Weights   11/29/17 0500 11/30/17 0546 12/01/17 0500  Weight: 77.1 kg 75.1 kg 75.8 kg    Examination: General:  Critically ill adult female in NAD non MV HEENT: Staples intact to crani site, pupils 3/reactive- right eye deviates to right intermittently, ETT/ OGT Neuro: attempts to open eyes to voice, w/d on left, no movement seen on right, intermittent biting with attempt at suctioning CV: SR 83, no murmur, +2 pulses PULM: even/non-labored on PS 10/5 with good TV 500-600, lungs bilaterally clear, minimal secretions, +cough GI: soft, surgical site cdi R abd, +bs Extremities: warm/dry, +1 generalized edema  Skin: no rashes, +tatooes   Resolved Hospital Problem list    Assessment & Plan:  Acute encephalopathy related to Large Left frontal IPH - S/p OR for mini craniotomy 9/19>> repeat OR 9/20 for crani/ flap >> swelling P:  Neurology primary Postop per NSGY SBP goal  < 160  Will d/c cleviprex secondary to increasing triglycerides and place on cardene Hourly neuro checks 3% management per neuro, goal Na 155-160, currently being held, is at goal 159 Sodium checks per protocol Continue keppra/ seizure precautions  Hypertensive crisis, hx HTN - increasing triglycerides - max cleviprex, SBP at goal barely P:  Tele monitoring SBP goal as above, changing to cardene Continue norvasc 10mg  daily, Labetalol 100mg  TID, and lisinopril 20mg  BID Monitor renal function daily, currently normal  Hold diuresis for now given Na 159 trend triglycerides Daily I/O/ wts  Acute respiratory insufficiency in the setting of massive IPH - CXR reviewed 9/23 - slight improvement  R basilar atelectasis, stable lines/ETT - Weaned 8hrs 8/22 10/5 P : Full MV support 8 cc/kg Wean FiO2/ Peep for sats > 94% Daily SBT, currently  on 10/5, attempt 5/5 however mental status will prevent extubation Minimize  sedation VAP measures  PAD protocol with fentanyl gtt Bowel regimen per PAD protocol, colace BID, prn dulcolax   Fever- likely neuro driven but need to RO bacterial source Leukocytosis - no clear source, pct reassuring, WBC down, CXR stable, cultures neg thus far, UA ok, no change in secretions Plan: Trend fever curve and WBC, monitor clinically off abx for now Trend PCT, normal thus far Monitor cx Intermittent CXR Tylenol for fever/ Cooling blanket >> monitor temp  Hypokalemia Hypo phos Hyperchloremia - 3% currently held, at goal 155-160 - net +2.9L, UOP 1.14ml/kg/hr P : Daily renal panel/mag Na q 6hrs Trend urinary output/ daily wts Replace electrolytes as indicated Continue foley   Anemia - stable Hgb today, likely hemodilutional/ s/p x 2 surgeries - no obvious bleeding  Plan: Trend CBC Transfuse fof HGB < 7 Monitor for bleeding   Protein calorie malnutrition - high triglycerides, remains on cleviprex, propofol d/c'd 8/22 On  Claviprex Propofol discontinued 8/22 Plan: Continue TF, at goal  CBG's Q 4 SSI coverage- standard Trending triglycerides SUP Pepcid  Possible ETOH abuse - unclear usage P:  Continue empiric thiamine/ folate  Hx ADHD/ depression / anxiety P:  Will address as mental status improves   Disposition / Summary of Today's Plan 12/01/17   Continue  daily SBT's KCL replete Monitoring Na q6 for to maintain goal range Fever control Social work consult     Diet: TF Pain/Anxiety/Delirium protocol (if indicated): fentanyl gtt VAP protocol (if indicated) indicated DVT prophylaxis: SCDs only  GI prophylaxis: pepcid  Hyperglycemia protocol: CBG q 4; SS standard Mobility: BR Code Status: full Family Communication:  No family at bedside.  Patient with no living parents, estranged adult son, [name] , 27yr old daughter with boyfriend who is daughter's father.  Will  place social work consult to contact son to establish who will be making decisions for patient. Unknown if has siblings.  Has other close friends listed as contacts.  Labs   CBC: Recent Labs  Lab 11/28/17 0832 11/29/17 0412 11/29/17 1742 11/30/17 0521 12/01/17 0521  WBC 14.0* 15.2* 21.5* 16.8* 14.4*  HGB 8.4* 7.7* 8.6* 8.2* 8.1*  HCT 27.3* 25.6* 27.7* 27.2* 26.2*  MCV 79.4 81.8 80.5 80.7 79.6  PLT 228 212 269 235 280   Basic Metabolic Panel: Recent Labs  Lab 11/28/17 0445 11/28/17 0832  11/29/17 0412  11/29/17 1742  11/30/17 0521 11/30/17 1145 11/30/17 1658 11/30/17 1727 11/30/17 2359 12/01/17 0521  NA 141 144   < > 153*   < >  --    < > 154*  156* 157*  --  157* 160* 159*  K 4.1 3.7  --  3.8  --   --   --  3.0*  --   --   --   --  3.3*  CL 112* 116*  --  126*  --   --   --  122*  --   --   --   --  125*  CO2 18* 19*  --  21*  --   --   --  25  --   --   --   --  23  GLUCOSE 172* 150*  --  144*  --   --   --  141*  --   --   --   --  134*  BUN 10 8  --  10  --   --   --  13  --   --   --   --  19  CREATININE 0.95 0.88  --  0.73  --   --   --  0.63  --   --   --   --  0.76  CALCIUM 7.4* 7.5*  --  7.6*  --   --   --  7.9*  --   --   --   --  7.9*  MG  --  1.7   < > 2.1  --  2.1  --  2.1  --  2.2  --   --  2.1  PHOS 2.0* 1.3*  --  1.2*  --  <1.0*  --  1.2*  --  2.1*  --   --  2.8   < > = values in this interval not displayed.   GFR: Estimated Creatinine Clearance: 85.3 mL/min (by C-G formula based on SCr of 0.76 mg/dL). Recent Labs  Lab 11/27/17 0949 11/27/17 1552  11/29/17 0412 11/29/17 1524 11/29/17 1742 11/30/17 0521 12/01/17 0521  PROCALCITON  --  <0.10  --   --   --   --  0.24 0.14  WBC  --   --    < > 15.2*  --  21.5* 16.8* 14.4*  LATICACIDVEN 4.07* 4.3*  --   --  1.9  --  1.3  --    < > = values in this interval not displayed.   Liver Function Tests: Recent Labs  Lab 11/27/17 0933 11/28/17 0445 11/29/17 0412 11/30/17 0521 12/01/17 0521  AST 24   --  24 30 60*  ALT 15  --  12 15 38  ALKPHOS 83  --  53 66 104  BILITOT 1.2  --  0.4 0.6 0.6  PROT 7.6  --  5.4* 6.2* 6.0*  ALBUMIN 3.8 2.7* 2.4* 2.2* 2.1*   No results for input(s): LIPASE, AMYLASE in the last 168 hours. No results for input(s): AMMONIA in the last 168 hours. ABG    Component Value Date/Time   PHART 7.335 (L) 11/29/2017 0310   PCO2ART 36.6 11/29/2017 0310   PO2ART 86.0 11/29/2017 0310   HCO3 19.5 (L) 11/29/2017 0310   TCO2 21 (L) 11/29/2017 0310   ACIDBASEDEF 6.0 (H) 11/29/2017 0310   O2SAT 96.0 11/29/2017 0310    Coagulation Profile: Recent Labs  Lab 11/27/17 0933  INR 1.06   Cardiac Enzymes: No results for input(s): CKTOTAL, CKMB, CKMBINDEX, TROPONINI in the last 168 hours. HbA1C: Hgb A1c MFr Bld  Date/Time Value Ref Range Status  11/28/2017 08:32 AM 5.8 (H) 4.8 - 5.6 % Final    Comment:    (NOTE) Pre diabetes:          5.7%-6.4% Diabetes:              >6.4% Glycemic control for   <7.0% adults with diabetes    CBG: Recent Labs  Lab 11/30/17 1524 11/30/17 1947 11/30/17 2329 12/01/17 0422 12/01/17 0747  GLUCAP 141* 119* 127* 116* 148*   CCT 50 mins  Posey BoyerBrooke Dayzha Pogosyan, AGACNP-BC  Pulmonary & Critical Care Pgr: [phone] or if no answer [phone] 12/01/2017, 9:50 AM

## 2017-12-01 NOTE — Progress Notes (Addendum)
PT Cancellation Note  Patient Details Name: Tammie GibsonKelly L Pollino MRN: [id number] DOB: 1975-08-20   Cancelled Treatment:    Reason Eval/Treat Not Completed: Medical issues which prohibited therapy.  Pt still does not seem appropriate for PT at this time.  We will follow along for medical stability.  She is also still on active bed rest.  Thanks,    Lurena Joinerebecca B. Harshal Sirmon, PT, DPT  Acute Rehabilitation [phone] pager #[phone] office   12/01/2017, 3:09 PM

## 2017-12-01 NOTE — Progress Notes (Signed)
  NEUROSURGERY PROGRESS NOTE   No issues overnight.  EXAM:  BP (!) 154/91   Pulse 81   Temp 98.8 F (37.1 C) (Rectal)   Resp 14   Ht 5\' 1"  (1.549 m)   Wt 75.8 kg   SpO2 95%   BMI 31.57 kg/m   Intubated, on fentanyl gtt No eye opening Breathes over vent Pupils reactive Minimal w/d LUE/LLE Wound c/d/i  IMPRESSION:  42 y.o. female s/p craniectomy and hematoma evacuation for LMCA stroke with secondary hemorrhage and malignant intracranial hypertension  PLAN: - Cont mgmt per neurology/PCCM - [facility] can come out in 10d - Will need outpatient NS f/u

## 2017-12-02 ENCOUNTER — Inpatient Hospital Stay (HOSPITAL_COMMUNITY): Payer: Medicaid Other

## 2017-12-02 DIAGNOSIS — G935 Compression of brain: Secondary | ICD-10-CM

## 2017-12-02 DIAGNOSIS — D72829 Elevated white blood cell count, unspecified: Secondary | ICD-10-CM

## 2017-12-02 DIAGNOSIS — I63412 Cerebral infarction due to embolism of left middle cerebral artery: Principal | ICD-10-CM

## 2017-12-02 DIAGNOSIS — I610 Nontraumatic intracerebral hemorrhage in hemisphere, subcortical: Secondary | ICD-10-CM

## 2017-12-02 DIAGNOSIS — I161 Hypertensive emergency: Secondary | ICD-10-CM

## 2017-12-02 DIAGNOSIS — Z9889 Other specified postprocedural states: Secondary | ICD-10-CM

## 2017-12-02 DIAGNOSIS — D649 Anemia, unspecified: Secondary | ICD-10-CM

## 2017-12-02 DIAGNOSIS — I6389 Other cerebral infarction: Secondary | ICD-10-CM

## 2017-12-02 DIAGNOSIS — I639 Cerebral infarction, unspecified: Secondary | ICD-10-CM | POA: Insufficient documentation

## 2017-12-02 LAB — RENAL FUNCTION PANEL
Albumin: 1.9 g/dL — ABNORMAL LOW (ref 3.5–5.0)
Anion gap: 6 (ref 5–15)
BUN: 26 mg/dL — AB (ref 6–20)
CALCIUM: 8 mg/dL — AB (ref 8.9–10.3)
CO2: 25 mmol/L (ref 22–32)
CREATININE: 0.8 mg/dL (ref 0.44–1.00)
Chloride: 125 mmol/L — ABNORMAL HIGH (ref 98–111)
GFR calc non Af Amer: >60 mL/min (ref >60)
GLUCOSE: 142 mg/dL — AB (ref 70–99)
Phosphorus: 3.6 mg/dL (ref 2.5–4.6)
Potassium: 3.5 mmol/L (ref 3.5–5.1)
SODIUM: 156 mmol/L — AB (ref 135–145)

## 2017-12-02 LAB — GLUCOSE, CAPILLARY
GLUCOSE-CAPILLARY: 113 mg/dL — AB (ref 70–99)
Glucose-Capillary: 131 mg/dL — ABNORMAL HIGH (ref 70–99)
Glucose-Capillary: 114 mg/dL — ABNORMAL HIGH (ref 70–99)
Glucose-Capillary: 132 mg/dL — ABNORMAL HIGH (ref 70–99)

## 2017-12-02 LAB — SODIUM
SODIUM: 162 mmol/L — AB (ref 135–145)
Sodium: 160 mmol/L — ABNORMAL HIGH (ref 135–145)
Sodium: 160 mmol/L — ABNORMAL HIGH (ref 135–145)

## 2017-12-02 LAB — CBC
HCT: 24.3 % — ABNORMAL LOW (ref 36.0–46.0)
Hemoglobin: 7.2 g/dL — ABNORMAL LOW (ref 12.0–15.0)
MCH: 23.8 pg — AB (ref 26.0–34.0)
MCHC: 29.6 g/dL — ABNORMAL LOW (ref 30.0–36.0)
MCV: 80.2 fL (ref 78.0–100.0)
PLATELETS: 246 10*3/uL (ref 150–400)
RBC: 3.03 MIL/uL — AB (ref 3.87–5.11)
RDW: 19.8 % — AB (ref 11.5–15.5)
WBC: 13.3 10*3/uL — ABNORMAL HIGH (ref 4.0–10.5)

## 2017-12-02 LAB — CULTURE, RESPIRATORY W GRAM STAIN

## 2017-12-02 LAB — PROCALCITONIN: PROCALCITONIN: 0.13 ng/mL

## 2017-12-02 LAB — MAGNESIUM: Magnesium: 2.3 mg/dL (ref 1.7–2.4)

## 2017-12-02 LAB — BETA-2-GLYCOPROTEIN I ABS, IGG/M/A
Beta-2 Glyco I IgG: <9 GPI IgG units (ref 0–20)
Beta-2-Glycoprotein I IgA: <9 GPI IgA units (ref 0–25)
Beta-2-Glycoprotein I IgM: <9 GPI IgM units (ref 0–32)

## 2017-12-02 MED ORDER — FAMOTIDINE 40 MG/5ML PO SUSR
20.0000 mg | Freq: Two times a day (BID) | ORAL | Status: DC
  Administered 2017-12-02 – 2018-01-08 (×75): 20 mg
  Filled 2017-12-02 (×76): qty 2.5

## 2017-12-02 MED ORDER — FUROSEMIDE 10 MG/ML IJ SOLN
20.0000 mg | Freq: Once | INTRAMUSCULAR | Status: AC
  Administered 2017-12-02: 20 mg via INTRAVENOUS
  Filled 2017-12-02: qty 2

## 2017-12-02 MED ORDER — POTASSIUM CHLORIDE 20 MEQ/15ML (10%) PO SOLN
40.0000 meq | Freq: Once | ORAL | Status: AC
  Administered 2017-12-02: 40 meq
  Filled 2017-12-02: qty 30

## 2017-12-02 NOTE — Progress Notes (Signed)
Patient transported on ventilator to CT and back to 4N17 with no complications. Vitals remained stable.

## 2017-12-02 NOTE — Progress Notes (Signed)
Occupational Therapy Evaluation Patient Details Name: Tammie Miles MRN: [id number] DOB: 1975/11/21 Today's Date: 12/02/2017    History of Present Illness pt is a 42 y/o with pmh significant for HTN, anxiety and ADHD, admitted with AMS/ unresponsiveness.  CT showed a large left sided ICH, pt s/p crani 9/19.  9/20 pt returned to OR for decompressive crani with L skull harvested in the abdomen.  Continues on vent. 9/24  Stable left to right midline shift at 6mm. stable small hematoma with the L basal ganglia.   Clinical Impression   Pt moved to EOB with Total A +2. R gaze preference with L eye opening to voice. R eye swollen shut.  Blinks to threat but did not track. Pushing with LUE in sitting but not following commands. +Clonus RU/LE. No righting  reaction observed in sitting. Completed BUE PROM and oriented neck to midline and positioned blanket roll to R lateral head to encourage better head positioning. Will follow acutely as appropriate. VSS during session. Encourage nsg to place pt in modified chair position in bed BID with lights on during the day/blinds up.     Follow Up Recommendations  Supervision/Assistance - 24 hour;LTACH;SNF(pending progress)    Equipment Recommendations  Hospital bed;Wheelchair (measurements OT);Wheelchair cushion (measurements OT)    Recommendations for Other Services Other (comment)(Palliative Care )     Precautions / Restrictions Precautions Precautions: Fall Precaution Comments: ETT, NG, line mgmt Required Braces or Orthoses: Other Brace/Splint Other Brace/Splint: B Prevalon Boots Restrictions Weight Bearing Restrictions: No      Mobility Bed Mobility Overal bed mobility: Needs Assistance Bed Mobility: Supine to Sit;Sit to Supine     Supine to sit: Total assist;+2 for physical assistance Sit to supine: Total assist;+2 for physical assistance      Transfers                 General transfer comment: unablet o safely assess     Balance Overall balance assessment: Needs assistance Sitting-balance support: Single extremity supported;Feet supported Sitting balance-Leahy Scale: Zero Sitting balance - Comments: non purposeful push of L UE (during assisted sitting)                                   ADL either performed or assessed with clinical judgement   ADL Overall ADL's : Needs assistance/impaired                                     Functional mobility during ADLs: Total assistance;+2 for physical assistance General ADL Comments: total A for all ADL     Vision   Vision Assessment?: Vision impaired- to be further tested in functional context Additional Comments: R gaze preference; R eye swollen shut; no tracking; blinks to threat     Perception Perception Comments: will assess   Praxis Praxis Praxis tested?: Deficits Deficits: Initiation(likely apraxic)    Pertinent Vitals/Pain Pain Assessment: Faces Pain Score: 0-No pain Faces Pain Scale: No hurt     Hand Dominance     Extremity/Trunk Assessment Upper Extremity Assessment Upper Extremity Assessment: RUE deficits/detail;LUE deficits/detail RUE Deficits / Details: flaccid RUE; +clonus LUE Deficits / Details: pushing through LUE in sitting; non-purposeful movemetn   Lower Extremity Assessment Lower Extremity Assessment: Defer to PT evaluation RLE Deficits / Details: mild clonus, little resistance to stretch, no spontaneous movement RLE Coordination: decreased  gross motor;decreased fine motor LLE Deficits / Details: mild clonus, no spontaneous movement noted.   Cervical / Trunk Assessment Cervical / Trunk Assessment: Other exceptions Cervical / Trunk Exceptions: posterior pevic tilt; no trunk support   Communication Communication Communication: Other (comment)(ETT)   Cognition Arousal/Alertness: Lethargic   Overall Cognitive Status: No family/caregiver present to determine baseline cognitive  functioning(likely WFL)                                 General Comments: not arousing; not following commands; opening L eye to name; no withdrawal from pain   General Comments  Eyes open 50% of time in sitting.  No purposeful reactions to stimulation.  Head rotated R consistently unless assisted to the left.  Positioned head L and pt sitting upright at ~45* before leaving.    Exercises Exercises: Other exercises Other Exercises Other Exercises: BUE PROM; elevation of hands on pillows   Shoulder Instructions      Home Living Family/patient expects to be discharged to:: Unsure                                 Additional Comments: pt has a boyfriend and an 76 y/o child (by boyfriend)      Prior Functioning/Environment Level of Independence: Independent        Comments: likely independent, but no family around to get PLOF        OT Problem List: Decreased strength;Decreased range of motion;Impaired balance (sitting and/or standing);Decreased activity tolerance;Impaired vision/perception;Decreased coordination;Decreased cognition;Decreased safety awareness;Decreased knowledge of use of DME or AE;Decreased knowledge of precautions;Cardiopulmonary status limiting activity;Impaired sensation;Impaired tone;Impaired UE functional use;Increased edema      OT Treatment/Interventions: Self-care/ADL training;Neuromuscular education;Therapeutic exercise;DME and/or AE instruction;Therapeutic activities;Splinting;Cognitive remediation/compensation;Visual/perceptual remediation/compensation;Patient/family education;Balance training    OT Goals(Current goals can be found in the care plan section) Acute Rehab OT Goals Patient Stated Goal: unable to participate with goals OT Goal Formulation: Patient unable to participate in goal setting Time For Goal Achievement: 12/16/17 Potential to Achieve Goals: Fair  OT Frequency: Min 2X/week   Barriers to D/C:             Co-evaluation PT/OT/SLP Co-Evaluation/Treatment: Yes Reason for Co-Treatment: Complexity of the patient's impairments (multi-system involvement);For patient/therapist safety   OT goals addressed during session: ADL's and self-care;Strengthening/ROM      AM-PAC PT "6 Clicks" Daily Activity     Outcome Measure Help from another person eating meals?: Total Help from another person taking care of personal grooming?: Total Help from another person toileting, which includes using toliet, bedpan, or urinal?: Total Help from another person bathing (including washing, rinsing, drying)?: Total Help from another person to put on and taking off regular upper body clothing?: Total Help from another person to put on and taking off regular lower body clothing?: Total 6 Click Score: 6   End of Session    Activity Tolerance: Patient limited by lethargy Patient left: in bed;with call bell/phone within reach;with bed alarm set;with SCD's reapplied;with restraints reapplied(modified chair position)  OT Visit Diagnosis: Other abnormalities of gait and mobility (R26.89);Muscle weakness (generalized) (M62.81);Low vision, both eyes (H54.2);Other symptoms and signs involving cognitive function;Hemiplegia and hemiparesis Hemiplegia - Right/Left: Right Hemiplegia - caused by: Nontraumatic intracerebral hemorrhage                Time: 9604-5409 OT Time Calculation (min): 27 min  Charges:  OT General Charges $OT Visit: 1 Visit OT Evaluation $OT Eval High Complexity: 1 High  Latif Nazareno, OT/L   Acute OT Clinical Specialist Acute Rehabilitation Services Pager [phone] Office [phone]   [facility] 12/02/2017, 2:28 PM

## 2017-12-02 NOTE — Progress Notes (Addendum)
NAME:  Tammie Miles, MRN:  [id number], DOB:  1975-12-05, LOS: 5 ADMISSION DATE:  11/27/2017, CONSULTATION DATE:  11/27/2017 REFERRING MD:  Dr. Conchita Paris CHIEF COMPLAINT:  Left frontal hemorrhage  Brief History   42 yoF with AMS since 9/18 evening, thought due to intoxication. Still unresponsive this am.  Patient was brought to the ED where a CT was done and showed a large left sided ICH.  Patient was taken to the OR and a mini craniectomy was done.  Patient was brought out to the ICU and PCCM was consulted for vent management.    Significant Hospital Events   9/19 Admit/ OR- mini crani 9/20>> Decompressive craniectomy ( L)  with skull harvesting in the abdomen  Consults: date of consult/date signed off & final recs:  9/19 NSGY 9/19 PCCM   Procedures (surgical and bedside):  9/19 Intubated 9/19 OR for crani, evac of hematoma>> no change in neuro status post op 9/20 Decompressive craniectomy ( L)  with skull harvesting in the abdomen  Significant Diagnostic Tests:  11/29/2017 CT Head Large left MCA infarct is stable in distribution. Interval left frontal craniectomy with mild herniation of left frontal lobe through the defect. Resolved uncal herniation, improved patency of left lateral ventricle, and 6 mm left-to-right midline shift, previously 8 mm. Partial interval dispersion of acute hemorrhage in the left basal ganglia. Subcentimeter density anterior to the prior position of blood products possibly representing retracted clot or interval hemorrhage, attention at follow-up recommended.  9/19 CTH  Large area of hemorrhage in the left basal ganglia and left frontal lobe, 5.8 x 3.5 cm. 15 mm of left-to-right midline shift.  9/19 CTA head 1. Positive for left MCA M1 large vessel occlusion, and also severe stenosis at the left ICA terminus, superimposed on the relatively large acute left hemisphere intra-axial hemorrhage (estimated blood volume 43 mL). Vasospasm suspected in the  proximal ACAs. 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 2. Negative for intracranial aneurysm, CTA spot sign, or evidence of vascular malformation in association with the acute hemorrhage. 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 3. Stable intracranial mass effect since 0938 hours today, with 16 mm of leftward midline shift and trapping of the right lateral ventricle.  [facility] 9/21 >> 1. Large left MCA infarct is stable in distribution. 2. Interval left frontal craniectomy with mild herniation of left frontal lobe through the defect. 3. Resolved uncal herniation, improved patency of left lateral ventricle, and 6 mm left-to-right midline shift, previously 8 mm. 4. Partial interval dispersion of acute hemorrhage in the left basal ganglia. Subcentimeter density anterior to the prior position of blood products possibly representing retracted clot or interval hemorrhage, attention at follow-up recommended.  [facility] 9/24 >> 1. Left MCA distribution infarction is stable in distribution. Mild increased edema of the infarct with increased herniation of the left frontal lobe via the left frontal craniectomy. 2. Stable 6 mm left-to-right midline shift and partial effacement of left lateral ventricle. 3. Stable small hematoma within the left basal ganglia. 4. No new acute intracranial abnormality identified.  Micro Data: 9/19 MRSA PCR >> Negative 9/19 Sputum Culture>> normal flora 9/22 Blood x 2>> 9/22 UA>> Normal 9/22>> Sputum>> few yeast >>  Antimicrobials:  9/19 Cefazolin (pre-op); 9/20;   Subjective:  CTH overnight showing increased edema w/increased herniation of left frontal lobe via/frontal craniectomy, otherwise stable changes  Down to 7.5 mg/hr cardene Remains on fentanyl 75 mcg/hr but weaning SBT  currently 8/5 Tmax 99.1 Hgb 8.1 -> 7.2  Objective   Blood pressure 123/75, pulse 68, temperature 99.1 F (37.3 C), temperature source Rectal, resp. rate 18, height 5\' 1"  (1.549 m), weight 80.4 kg, SpO2 99 %.    Vent Mode: PSV;CPAP FiO2 (%):  [30 %] 30 % Set Rate:  [16 bmp] 16 bmp Vt Set:  [380 mL] 380 mL PEEP:  [5 cmH20] 5 cmH20 Pressure Support:  [8 cmH20-10 cmH20] 8 cmH20 Plateau Pressure:  [15 cmH20] 15 cmH20   Intake/Output Summary (Last 24 hours) at 12/02/2017 0856 Last data filed at 12/02/2017 0700 Gross per 24 hour  Intake 3224.92 ml  Output 1977 ml  Net 1247.92 ml   Filed Weights   11/30/17 0546 12/01/17 0500 12/02/17 0200  Weight: 75.1 kg 75.8 kg 80.4 kg    Examination: General:  Critically ill adult female in NAD on MV HEENT: crani site with stable intact- no obvious bulging, increase in facial/ eyelid edema, ETT 7.5 at 21, OGT, MM pink/moist, pupils 3/reactive Neuro: attempts to opens eyes to verbal, does not withdrawal to noxious stimuli today CV: SR, 69, +2 pulses PULM: even/non-labored on PSV 8/5, lungs bilaterally clear, minimal secretions, + cough GI: soft, bs active, right abd incision cdi Extremities: warm/dry, increased generalized edema +2 Skin: no rashes   Resolved Hospital Problem list    Assessment & Plan:  Acute encephalopathy related to Large Left frontal IPH - S/p OR for mini craniotomy 9/19>> repeat OR 9/20 for crani/ flap >> swelling P:  Neurology primary Postop per NSGY Continue cardene for SBP goal  < 160  Defer 3% management to neuro, goal Na 155-160 Hourly neuro checks Continue keppra/ seizure precautions PAD protocol with fentanyl gtt, weaning as able Bowel regimen with PAD protocol   Hypertensive crisis, hx HTN P:  Tele monitoring SBP goal as above Continue norvasc 10mg  daily, Labetalol 100mg  TID, and lisinopril 20mg  BID Monitor renal function daily, currently normal  Daily I/O/ wts Lasix 20mg  x 1 with KCL 40 meq  Acute  respiratory insufficiency in the setting of massive IPH P : Full MV support 8 cc/kg Weaning FiO2/ Peep for sats > 94% Ongoing daily SBT, mental status remains barrier to extubation Intermittent CXR VAP measures  Minimize sedation Will likely need trach sooner than later  Fever-  Leukocytosis - improving - likely neuro driven but need to RO bacterial source Plan: Trend fever curve and WBC Follow cultures  Continue to monitor clinically off abx for now PCT 0.13, remains reassuring  Intermittent CXR Tylenol for fever/ Cooling blanket >> monitor temp  Hypokalemia Hypo phos Hyperchloremia - 3% currently held, at goal 155-160 - +1.3L/ net +4.2 P : Lasix and KCL as above Continue foley Na q 6hrs Trend renal panel/ mag / urinary output  Anemia - Hgb trend stable in 8's, now 7.2 Plan: Trend CBC Transfuse fof HGB < 7 SCDs only   Protein calorie malnutrition Plan: Continue TF at goal  CBG's Q 4 SSI coverage- standard SUP Pepcid  Possible ETOH abuse - unclear usage P:  Continue empiric thiamine/ folate  Hx ADHD/ depression / anxiety P:  Will address as mental status improves  Disposition / Summary of Today's Plan 12/02/17   Monitoring Na q 6, goal 155-160 Ongoing SBP control  Wean fentanyl gtt Gentle diuresis Still trying to sort out family dynamics- establish next of kin per CSW    Diet:  TF Pain/Anxiety/Delirium protocol (if indicated): fentanyl gtt- wean as able VAP protocol (if indicated) indicated DVT prophylaxis: SCDs only  GI prophylaxis: pepcid  Hyperglycemia protocol: CBG q 4; SS standard Mobility: BR Code Status: full Family Communication:  Has 42 year old daughter and boyfriend who is father.  Estranged adult son.  CSW to help establish next of kin/ GOC  Labs   CBC: Recent Labs  Lab 11/29/17 0412 11/29/17 1742 11/30/17 0521 12/01/17 0521 12/02/17 0518  WBC 15.2* 21.5* 16.8* 14.4* 13.3*  HGB 7.7* 8.6* 8.2* 8.1* 7.2*  HCT 25.6* 27.7*  27.2* 26.2* 24.3*  MCV 81.8 80.5 80.7 79.6 80.2  PLT 212 269 235 280 246   Basic Metabolic Panel: Recent Labs  Lab 11/28/17 0832  11/29/17 0412  11/29/17 1742  11/30/17 0521  11/30/17 1658  12/01/17 0521 12/01/17 1301 12/01/17 1800 12/02/17 0039 12/02/17 0518  NA 144   < > 153*   < >  --    < > 154*  156*   < >  --    < > 159* 156* 160* 160* 156*  K 3.7  --  3.8  --   --   --  3.0*  --   --   --  3.3*  --   --   --  3.5  CL 116*  --  126*  --   --   --  122*  --   --   --  125*  --   --   --  125*  CO2 19*  --  21*  --   --   --  25  --   --   --  23  --   --   --  25  GLUCOSE 150*  --  144*  --   --   --  141*  --   --   --  134*  --   --   --  142*  BUN 8  --  10  --   --   --  13  --   --   --  19  --   --   --  26*  CREATININE 0.88  --  0.73  --   --   --  0.63  --   --   --  0.76  --   --   --  0.80  CALCIUM 7.5*  --  7.6*  --   --   --  7.9*  --   --   --  7.9*  --   --   --  8.0*  MG 1.7   < > 2.1  --  2.1  --  2.1  --  2.2  --  2.1  --   --   --  2.3  PHOS 1.3*  --  1.2*  --  <1.0*  --  1.2*  --  2.1*  --  2.8  --   --   --  3.6   < > = values in this interval not displayed.   GFR: Estimated Creatinine Clearance: 87.9 mL/min (by C-G formula based on SCr of 0.8 mg/dL). Recent Labs  Lab 11/27/17 0949 11/27/17 1552  11/29/17 1524 11/29/17 1742 11/30/17 0521 12/01/17 0521 12/02/17 0518  PROCALCITON  --  <0.10  --   --   --  0.24 0.14 0.13  WBC  --   --    < >  --  21.5* 16.8* 14.4* 13.3*  LATICACIDVEN 4.07* 4.3*  --  1.9  --  1.3  --   --    < > = values in this interval not displayed.   Liver Function Tests: Recent Labs  Lab 11/27/17 0933 11/28/17 0445 11/29/17 0412 11/30/17 0521 12/01/17 0521 12/02/17 0518  AST 24  --  24 30 60*  --   ALT 15  --  12 15 38  --   ALKPHOS 83  --  53 66 104  --   BILITOT 1.2  --  0.4 0.6 0.6  --   PROT 7.6  --  5.4* 6.2* 6.0*  --   ALBUMIN 3.8 2.7* 2.4* 2.2* 2.1* 1.9*   No results for input(s): LIPASE, AMYLASE in the  last 168 hours. No results for input(s): AMMONIA in the last 168 hours. ABG    Component Value Date/Time   PHART 7.335 (L) 11/29/2017 0310   PCO2ART 36.6 11/29/2017 0310   PO2ART 86.0 11/29/2017 0310   HCO3 19.5 (L) 11/29/2017 0310   TCO2 21 (L) 11/29/2017 0310   ACIDBASEDEF 6.0 (H) 11/29/2017 0310   O2SAT 96.0 11/29/2017 0310    Coagulation Profile: Recent Labs  Lab 11/27/17 0933  INR 1.06   Cardiac Enzymes: No results for input(s): CKTOTAL, CKMB, CKMBINDEX, TROPONINI in the last 168 hours. HbA1C: Hgb A1c MFr Bld  Date/Time Value Ref Range Status  11/28/2017 08:32 AM 5.8 (H) 4.8 - 5.6 % Final    Comment:    (NOTE) Pre diabetes:          5.7%-6.4% Diabetes:              >6.4% Glycemic control for   <7.0% adults with diabetes    CBG: Recent Labs  Lab 12/01/17 1601 12/01/17 1935 12/01/17 2318 12/02/17 0314 12/02/17 0746  GLUCAP 112* 132* 133* 131* 114*   CCT 35 mins  Posey Boyer, AGACNP-BC [facility] Pgr: [phone] or if no answer [phone] 12/02/2017, 8:56 AM

## 2017-12-02 NOTE — Progress Notes (Signed)
Attempted to contact patients son [name] (42 years old). Family/ friends unable to give me his contact information but stated that they would try and get his contact information.

## 2017-12-02 NOTE — Progress Notes (Signed)
NEUROHOSPITALISTS STROKE TEAM - DAILY PROGRESS NOTE   SUBJECTIVE No family is at bedside. Patient is still intubated and on fentanyl in the ICU.  Eyes closed but able to open on pain stimulation, but not following commands.  Right eyelid swollen due to positioning.  Still has right hemiplegia. Na 156, off 3%.  BP improved on p.o. medication and low-dose Cardene.  CT repeat stable hematoma and large left MCA infarct  OBJECTIVE Most recent Vital Signs: Vitals:   12/02/17 1730 12/02/17 1745 12/02/17 1800 12/02/17 1815  BP: 134/80 125/74 120/76 138/77  Pulse: 79 75 74 73  Resp: 13 13 13 13   Temp:    99.6 F (37.6 C)  TempSrc:    Axillary  SpO2: 100% 100% 100% 100%  Weight:      Height:       CBG (last 3)  Recent Labs    12/02/17 0746 12/02/17 1137 12/02/17 1534  GLUCAP 114* 132* 113*    Physical Exam  Temp:  [99.1 F (37.3 C)-99.9 F (37.7 C)] 99.6 F (37.6 C) (09/24 1815) Pulse Rate:  [64-98] 73 (09/24 1815) Resp:  [10-25] 13 (09/24 1815) BP: (109-165)/(63-106) 138/77 (09/24 1815) SpO2:  [96 %-100 %] 100 % (09/24 1815) FiO2 (%):  [30 %] 30 % (09/24 1507) Weight:  [80.4 kg] 80.4 kg (09/24 0200)   HEENT-status post craniotomy with dressing clean dry and intact  cardiovascular- S1-S2 audible, pulses palpable throughout   Lungs-and breath sounds Abdomen-hypoactive bowel sounds  musculoskeletal-no joint tenderness, deformity or swelling Skin-warm and dry Neuro - intubated on low dose sedation, barely open eyes on pain stimulation, b/l eyelid swollen, right more than left, PERRL, eye midposition, sluggish doll's eye. Not blinking to visual threat, not tracking. Left corneal positive, and right absent. Gag positive. Facial symmetry not able to test due to ET. On pain stimulation, LUE localized to pain and able to against gravity. LLE withdraw to pain and 3-/5. RUE and RLE flaccid. B/l babinski positive. Sensation, coordination  and gait not tested.    Lipid Panel    Component Value Date/Time   CHOL 138 11/28/2017 0832   TRIG 414 (H) 11/30/2017 1658   HDL 42 11/28/2017 0832   CHOLHDL 3.3 11/28/2017 0832   VLDL 32 11/28/2017 0832   LDLCALC 64 11/28/2017 0832   HgbA1C  Lab Results  Component Value Date   HGBA1C 5.8 (H) 11/28/2017    Urine Drug Screen:      Component Value Date/Time   LABOPIA NONE DETECTED 11/27/2017 1415   COCAINSCRNUR NONE DETECTED 11/27/2017 1415   LABBENZ NONE DETECTED 11/27/2017 1415   AMPHETMU POSITIVE (A) 11/27/2017 1415   THCU NONE DETECTED 11/27/2017 1415   LABBARB NONE DETECTED 11/27/2017 1415    Alcohol Level:  Recent Labs  Lab 11/27/17 0948  ETH <10    Ct Angio Head W Or Wo Contrast  IMPRESSION: 1. Positive for left MCA M1 large vessel occlusion, and also severe stenosis at the left ICA terminus, superimposed on the relatively large acute left hemisphere intra-axial hemorrhage (estimated blood volume 43 mL). Vasospasm suspected in the proximal ACAs. 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  2. Negative for intracranial aneurysm, CTA spot sign, or evidence of vascular malformation in association with the acute hemorrhage. 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 3. Stable intracranial mass effect since 0938 hours today, with 16 mm of leftward midline shift and trapping of the right lateral ventricle.   Ct Head Wo Contrast  11/28/2017 IMPRESSION: 1. Stable when compared to yesterday. 2. Left MCA territory infarct with evacuated hematoma. Midline shift measures 8 mm. 3. 2-3 mm subdural hematoma along the right cerebral convexity without interval increase.   Ct Head Wo Contrast  11/27/2017 IMPRESSION: 1. Interval LEFT frontal craniotomy for basal ganglia hematoma evacuation, small  amount of residual blood products and regional edema. 7 mm residual LEFT to RIGHT midline shift, resolved RIGHT ventricular entrapment. 2. Trace RIGHT holo hemispheric and tentorial subdural hematomas. Global edema.   Ct Head Wo Contrast 11/27/2017 IMPRESSION: Large area of hemorrhage in the left basal ganglia and left frontal lobe, 5.8 x 3.5 cm. 15 mm of left-to-right midline shift.  Mr Brain Wo Contrast 11/28/2017 IMPRESSION: 1. Postoperative changes from previous left frontal craniotomy for evacuation of left basal ganglia hematoma. Residual blood products within the evacuation cavity with persistent surrounding vasogenic edema and regional mass effect. Associated 8-9 mm of right-to-left midline shift with mild left uncal herniation similar to previous. No hydrocephalus or ventricular trapping. 2. Underlying evolving large acute ischemic left MCA territory infarct involving the majority of the left MCA territory. 3. Trace right cerebral and tentorial subdural hematoma without mass effect. 4. Right sphenoid sinusitis.  Ct Head Wo Contrast  Result Date: 11/29/2017 CLINICAL DATA:  42 y/o  F; stroke for follow-up. EXAM: CT HEAD WITHOUT CONTRAST TECHNIQUE: Contiguous axial images were obtained from the base of the skull through the vertex without intravenous contrast. COMPARISON:  11/27/2017 MRI head.  11/28/2017 CT head. FINDINGS: Brain: Large left MCA infarction is stable in distribution. Interval left frontal craniectomy with partial herniation of the left frontal lobe through the craniectomy defect. Interval resolution of uncal herniation, improved patency of the left lateral ventricle, and 6 mm left-to-right midline shift, previously 8 mm. Partial interval dispersion of acute hemorrhage in the left basal ganglia post partial evacuation. There is a subcentimeter density anterior to the prior position of blood products possibly representing retracted clot or interval hemorrhage (series 3, image 19),  attention at follow-up recommended. Vascular: No hyperdense vessel or unexpected calcification. Skull: Interval left frontal craniectomy with edema and several foci of air in the overlying scalp as well as skin staples. Sinuses/Orbits: Possible right sphenoid sinus opacification with aerosolized secretions. Normal aeration of the mastoid air cells. Other: None. IMPRESSION: 1. Large left MCA infarct is stable in distribution. 2. Interval left frontal craniectomy with mild herniation of left frontal lobe through the defect. 3. Resolved uncal herniation, improved patency of left lateral ventricle, and 6 mm left-to-right midline shift, previously 8 mm. 4. Partial interval dispersion of acute hemorrhage in the left basal ganglia. Subcentimeter density anterior to the prior position of blood products possibly representing retracted clot or interval hemorrhage, attention at follow-up recommended. Electronically Signed   By: Kristine Garbe M.D.   On: 11/29/2017 06:00   Ct Head Wo Contrast  Result Date: 12/02/2017 CLINICAL DATA:  42 y/o  F; intracranial hemorrhage for follow-up. EXAM: CT HEAD WITHOUT CONTRAST TECHNIQUE: Contiguous axial images were obtained from the base of the skull through the vertex without  intravenous contrast. COMPARISON:  11/29/2017 CT head. FINDINGS: Brain: Stable distribution of left MCA infarction. Mild interval increase in edema a mildly increased herniation of the left frontal lobe the craniectomy. Stable 6 mm left-to-right midline shift and partial effacement of the left lateral ventricle. Stable hematoma within the left basal ganglia post partial evacuation. No new acute intracranial hemorrhage, stroke, or focal mass effect. Vascular: No hyperdense vessel or unexpected calcification. Skull: Left frontal craniectomy postsurgical changes. Decreased edema and interval resolution of soft tissue air and pneumocephalus. Sinuses/Orbits: Aerosolized secretions in the right sphenoid sinus.  Additional visualized paranasal sinuses and the mastoid air cells are normally aerated. Other: None. IMPRESSION: 1. Left MCA distribution infarction is stable in distribution. Mild increased edema of the infarct with increased herniation of the left frontal lobe via the left frontal craniectomy. 2. Stable 6 mm left-to-right midline shift and partial effacement of left lateral ventricle. 3. Stable small hematoma within the left basal ganglia. 4. No new acute intracranial abnormality identified. Electronically Signed   By: Kristine Garbe M.D.   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 15 mm of left-to-right midline shift.  MRI of the brain: Post craniotomy 8-9 mm of right-to-left midline shift with mild left uncal herniation similar to previous. Underlying evolving large acute ischemic left MCA territory infarct involving the majority of the left MCA territory.  Repeat Head CT post evacuation 9/20 - Left MCA territory infarct with evacuated hematoma. Midline shift measures 8 mm.   2D Echocardiogram EF 60 to 65%  LE venous doppler negative for DVT  Hypercoagulable labs negative except high ESR and CRP  Consider TEE once improved and stable  LDL 64  HgbA1c 5.8  VTE: Heparin subq  Antithrombotic: no anti-coagulation secondary to intracranial hemorrhage  Continue Rehab with PT consult, OT consult, Speech consult  Therapy recommendations: Pending  Disposition: Pending  Cerebral edema  CT head  midline shift 35m with uncal herniation  Repeat CT head s/p hematoma evacuation - midline shift 875m 3% saline on hold  Na goal between 150-155.    Na 153->155-> 154->159->156->156  S/p hemicrani 9/20  Repeat CT 9/21 - midline shift 57m48mOn keppra for seizure prevention  CT repeat 12/02/2017 stable midline shift, large left MCA infarct, residual hematoma   Respiratory failure  Intubated on sedation  CCM on board  Continue on vent  Tolerating weaning trials   Hypertension emergency  SBP stable on Cardene  BP goal <160   on a Cleviprex drip -switch to double strength Cardene due to high TG  On norvasc and lisinopril  On labetalol to 300m60mtimes daily  Wean off Cardene as possible   Anemia  Hb 11.8 on admission  Hemoglobin 8.4 -> 7.7-> 8.2-> 8.1->7.2  Continue to monitor  PRBC transfusion if Hb < 7.0   Other Stroke Risk Factors  ETOH use   Other Active Problems  ADHD - on Adderell at home  Anxiety  Depression  Hypokalemia, supplement  Leukocytosis WBC 21.5-16.8-> 14.4-> 13.3  Hospital day # 5  This patient is critically ill due to large left MCA infarct, uncal herniation s/p hemicrani, large hemorrhagic conversion, intubated and at significant risk of neurological worsening, death form stroke recurrence, hematoma expansion, cerebral edema and uncal herniation, seizure. This patient's care requires constant monitoring of vital signs, hemodynamics, respiratory and cardiac monitoring, review of multiple databases, neurological assessment, discussion with family, other specialists and medical decision making of high complexity. I spent 35 minutes of neurocritical care time in the care of this patient.  Rosalin Hawking, MD PhD Stroke Neurology 12/02/2017 6:31 PM     To contact Stroke Continuity provider, please refer to [email or web address] After hours, contact General Neurology

## 2017-12-02 NOTE — Evaluation (Signed)
Physical Therapy Evaluation Patient Details Name: Tammie Miles MRN: [id number] DOB: [birth year] Today's Date: 12/02/2017   History of Present Illness  pt is a 42 y/o with pmh significant for HTN, anxiety and ADHD, admitted with AMS/ unresponsiveness.  CT showed a large left sided ICH, pt s/p crani 9/19.  9/20 pt returned to OR for decompressive crani with L skull harvested in the abdomen.  Continues on vent. 9/24  Stable left to right midline shift at 6mm. stable small hematoma with the L basal ganglia.  Clinical Impression  Pt admitted with/for unresponsiveness due to large left ICH.  Pt needing significant assist for all mobility.  Pt currently limited functionally due to the problems listed. ( See problems list.)   Pt will benefit from PT to maximize function and safety in order to get ready for next venue listed below.     Follow Up Recommendations SNF, but all depends on progress    Equipment Recommendations  Other (comment)(TBA)    Recommendations for Other Services       Precautions / Restrictions Precautions Precautions: Fall Precaution Comments: ETT, NG, line mgmt Required Braces or Orthoses: Other Brace/Splint Other Brace/Splint: B Prevalon Boots Restrictions Weight Bearing Restrictions: No      Mobility  Bed Mobility Overal bed mobility: Needs Assistance Bed Mobility: Supine to Sit;Sit to Supine     Supine to sit: Total assist;+2 for physical assistance Sit to supine: Total assist;+2 for physical assistance      Transfers                 General transfer comment: Not tested  Ambulation/Gait                Stairs            Wheelchair Mobility    Modified Rankin (Stroke Patients Only) Modified Rankin (Stroke Patients Only) Pre-Morbid Rankin Score: No symptoms Modified Rankin: Severe disability     Balance Overall balance assessment: Needs assistance Sitting-balance support: Single extremity supported;Feet supported Sitting  balance-Leahy Scale: Zero Sitting balance - Comments: non purposeful push of L UE (during assisted sitting)                                     Pertinent Vitals/Pain Pain Assessment: Faces Faces Pain Scale: No hurt    Home Living Family/patient expects to be discharged to:: Unsure                 Additional Comments: pt has a boyfriend and an 74 y/o child (by boyfriend)    Prior Function Level of Independence: Independent         Comments: likely independent, but no family around to get PLOF     Hand Dominance        Extremity/Trunk Assessment   Upper Extremity Assessment Upper Extremity Assessment: Defer to OT evaluation RUE Deficits / Details: flaccid RUE; +clonus LUE Deficits / Details: pushing through LUE in sitting; non-purposeful movemetn    Lower Extremity Assessment Lower Extremity Assessment: Defer to PT evaluation RLE Deficits / Details: mild clonus, little resistance to stretch, no spontaneous movement RLE Coordination: decreased gross motor;decreased fine motor LLE Deficits / Details: mild clonus, no spontaneous movement noted.    Cervical / Trunk Assessment Cervical / Trunk Assessment: Other exceptions Cervical / Trunk Exceptions: posterior pevic tilt; no trunk support  Communication   Communication: Other (comment)(ETT)  Cognition Arousal/Alertness: Lethargic  Overall Cognitive Status: No family/caregiver present to determine baseline cognitive functioning                                 General Comments: not arousing; not following commands; opening L eye to name; no withdrawal from pain      General Comments      Exercises Other Exercises Other Exercises: BUE PROM; elevation of hands on pillows Other Exercises: B LE PROM   Assessment/Plan    PT Assessment Patient needs continued PT services  PT Problem List Decreased strength;Decreased balance;Decreased mobility;Decreased coordination       PT  Treatment Interventions Functional mobility training;Therapeutic activities;Balance training;Neuromuscular re-education;Patient/family education    PT Goals (Current goals can be found in the Care Plan section)  Acute Rehab PT Goals Patient Stated Goal: unable to participate with goals PT Goal Formulation: Patient unable to participate in goal setting Time For Goal Achievement: 12/16/17 Potential to Achieve Goals: Fair    Frequency Min 3X/week   Barriers to discharge        Co-evaluation   Reason for Co-Treatment: Complexity of the patient's impairments (multi-system involvement);For patient/therapist safety   OT goals addressed during session: ADL's and self-care;Strengthening/ROM       AM-PAC PT "6 Clicks" Daily Activity  Outcome Measure Difficulty turning over in bed (including adjusting bedclothes, sheets and blankets)?: Unable Difficulty moving from lying on back to sitting on the side of the bed? : Unable Difficulty sitting down on and standing up from a chair with arms (e.g., wheelchair, bedside commode, etc,.)?: Unable Help needed moving to and from a bed to chair (including a wheelchair)?: Total Help needed walking in hospital room?: Total Help needed climbing 3-5 steps with a railing? : Total 6 Click Score: 6    End of Session Equipment Utilized During Treatment: Oxygen Activity Tolerance: Other (comment)(little response noted other than some eye opening) Patient left: in bed;with call bell/phone within reach;with bed alarm set Nurse Communication: Mobility status PT Visit Diagnosis: Hemiplegia and hemiparesis Hemiplegia - Right/Left: Right Hemiplegia - caused by: Nontraumatic intracerebral hemorrhage    Time: 1151-1218 PT Time Calculation (min) (ACUTE ONLY): 27 min   Charges:   PT Evaluation $PT Eval Moderate Complexity: 1 Mod          12/02/2017  Ollie BingKen Petrice Beedy, PT Acute Rehabilitation Services [phone]  (pager) [phone]   (office)  Eliseo GumKenneth V Bergen Magner 12/02/2017, 5:52 PM

## 2017-12-03 ENCOUNTER — Inpatient Hospital Stay (HOSPITAL_COMMUNITY): Payer: Medicaid Other

## 2017-12-03 DIAGNOSIS — J988 Other specified respiratory disorders: Secondary | ICD-10-CM

## 2017-12-03 LAB — GLUCOSE, CAPILLARY
GLUCOSE-CAPILLARY: 111 mg/dL — AB (ref 70–99)
GLUCOSE-CAPILLARY: 122 mg/dL — AB (ref 70–99)
Glucose-Capillary: 104 mg/dL — ABNORMAL HIGH (ref 70–99)
Glucose-Capillary: 114 mg/dL — ABNORMAL HIGH (ref 70–99)
Glucose-Capillary: 121 mg/dL — ABNORMAL HIGH (ref 70–99)
Glucose-Capillary: 126 mg/dL — ABNORMAL HIGH (ref 70–99)
Glucose-Capillary: 130 mg/dL — ABNORMAL HIGH (ref 70–99)
Glucose-Capillary: 97 mg/dL (ref 70–99)

## 2017-12-03 LAB — RENAL FUNCTION PANEL
ALBUMIN: 1.9 g/dL — AB (ref 3.5–5.0)
Anion gap: 9 (ref 5–15)
BUN: 31 mg/dL — ABNORMAL HIGH (ref 6–20)
CO2: 25 mmol/L (ref 22–32)
Calcium: 8.4 mg/dL — ABNORMAL LOW (ref 8.9–10.3)
Chloride: 125 mmol/L — ABNORMAL HIGH (ref 98–111)
Creatinine, Ser: 0.78 mg/dL (ref 0.44–1.00)
GFR calc Af Amer: >60 mL/min (ref >60)
GFR calc non Af Amer: >60 mL/min (ref >60)
GLUCOSE: 126 mg/dL — AB (ref 70–99)
PHOSPHORUS: 4.6 mg/dL (ref 2.5–4.6)
POTASSIUM: 4 mmol/L (ref 3.5–5.1)
SODIUM: 159 mmol/L — AB (ref 135–145)

## 2017-12-03 LAB — SODIUM
SODIUM: 156 mmol/L — AB (ref 135–145)
SODIUM: 159 mmol/L — AB (ref 135–145)
Sodium: 159 mmol/L — ABNORMAL HIGH (ref 135–145)

## 2017-12-03 LAB — CBC
HEMATOCRIT: 28 % — AB (ref 36.0–46.0)
HEMOGLOBIN: 8.3 g/dL — AB (ref 12.0–15.0)
MCH: 24.7 pg — ABNORMAL LOW (ref 26.0–34.0)
MCHC: 29.6 g/dL — ABNORMAL LOW (ref 30.0–36.0)
MCV: 83.3 fL (ref 78.0–100.0)
Platelets: 227 10*3/uL (ref 150–400)
RBC: 3.36 MIL/uL — AB (ref 3.87–5.11)
RDW: 20 % — ABNORMAL HIGH (ref 11.5–15.5)
WBC: 13.4 10*3/uL — AB (ref 4.0–10.5)

## 2017-12-03 LAB — LUPUS ANTICOAGULANT PANEL
DRVVT: 49.5 s — AB (ref 0.0–47.0)
PTT Lupus Anticoagulant: 42.3 s (ref 0.0–51.9)

## 2017-12-03 LAB — HEMOGLOBIN AND HEMATOCRIT, BLOOD
HCT: 23.8 % — ABNORMAL LOW (ref 36.0–46.0)
Hemoglobin: 6.9 g/dL — CL (ref 12.0–15.0)

## 2017-12-03 LAB — PREPARE RBC (CROSSMATCH)

## 2017-12-03 LAB — MAGNESIUM: Magnesium: 2.6 mg/dL — ABNORMAL HIGH (ref 1.7–2.4)

## 2017-12-03 LAB — DRVVT MIX: DRVVT MIX: 44.2 s (ref 0.0–47.0)

## 2017-12-03 MED ORDER — CHLORHEXIDINE GLUCONATE CLOTH 2 % EX PADS
6.0000 | MEDICATED_PAD | Freq: Every day | CUTANEOUS | Status: DC
  Administered 2017-12-04 – 2017-12-08 (×6): 6 via TOPICAL

## 2017-12-03 MED ORDER — POTASSIUM CHLORIDE 20 MEQ/15ML (10%) PO SOLN
40.0000 meq | Freq: Once | ORAL | Status: AC
  Administered 2017-12-03: 40 meq
  Filled 2017-12-03: qty 30

## 2017-12-03 MED ORDER — SODIUM CHLORIDE 0.45 % IV SOLN
INTRAVENOUS | Status: DC
  Administered 2017-12-03 – 2017-12-04 (×2): via INTRAVENOUS

## 2017-12-03 MED ORDER — FUROSEMIDE 10 MG/ML IJ SOLN
20.0000 mg | Freq: Once | INTRAMUSCULAR | Status: AC
  Administered 2017-12-03: 20 mg via INTRAVENOUS
  Filled 2017-12-03: qty 2

## 2017-12-03 MED ORDER — SODIUM CHLORIDE 0.9% IV SOLUTION
Freq: Once | INTRAVENOUS | Status: AC
  Administered 2017-12-03: 03:00:00 via INTRAVENOUS

## 2017-12-03 MED ORDER — VITAMIN B-1 100 MG PO TABS
100.0000 mg | ORAL_TABLET | Freq: Every day | ORAL | Status: DC
  Administered 2017-12-04 – 2018-01-08 (×36): 100 mg
  Filled 2017-12-03 (×36): qty 1

## 2017-12-03 MED ORDER — FOLIC ACID 1 MG PO TABS
1.0000 mg | ORAL_TABLET | Freq: Every day | ORAL | Status: DC
  Administered 2017-12-03 – 2018-01-08 (×37): 1 mg
  Filled 2017-12-03 (×37): qty 1

## 2017-12-03 MED ORDER — LORAZEPAM 2 MG/ML IJ SOLN: 2.0000 mg | Freq: Once | INTRAMUSCULAR | Status: DC | PRN

## 2017-12-03 MED ORDER — LEVETIRACETAM IN NACL 1000 MG/100ML IV SOLN
1000.0000 mg | Freq: Two times a day (BID) | INTRAVENOUS | Status: DC
  Administered 2017-12-03 – 2017-12-16 (×26): 1000 mg via INTRAVENOUS
  Filled 2017-12-03 (×28): qty 100

## 2017-12-03 NOTE — Progress Notes (Addendum)
NEUROHOSPITALISTS STROKE TEAM - DAILY PROGRESS NOTE   SUBJECTIVE No family is at bedside. Patient is still intubated and on low dose fentanyl. No neuro change, still able to open eyes on pain, but no tracking, not following commands. Had Hb 6.9 last night, received one unit PRBC and this am Hb 8.3. Na 159, on 1/2NS. BP stable and off cardene now.   OBJECTIVE Most recent Vital Signs: Vitals:   12/03/17 0500 12/03/17 0600 12/03/17 0700 12/03/17 0802  BP: (!) 152/92 (!) 154/90 (S) (!) 161/87   Pulse: 60 61 (!) 59   Resp: _0 (!) 24  Temp:    98.7 F (37.1 C)  TempSrc:    Rectal  SpO2: 100% 100% 100%   Weight:      Height:       CBG (last 3)  Recent Labs    12/03/17 0340 12/03/17 0732 12/03/17 1200  GLUCAP 104* 97 114*    Physical Exam  Temp:  [98.7 F (37.1 C)-100.7 F (38.2 C)] 98.7 F (37.1 C) (09/25 0802) Pulse Rate:  [58-95] 59 (09/25 0700) Resp:  [11-24] 24 (09/25 0802) BP: (117-161)/(70-92) 161/87 (09/25 0700) SpO2:  [99 %-100 %] 100 % (09/25 0700) FiO2 (%):  [30 %] 30 % (09/25 1114) Weight:  [78.4 kg] 78.4 kg (09/25 0200)   HEENT-status post craniotomy with dressing clean dry and intact  cardiovascular- S1-S2 audible, pulses palpable throughout   Lungs- low breath sounds bilaterally Abdomen-hypoactive bowel sounds  musculoskeletal-no joint tenderness, deformity or swelling Skin-warm and dry Neuro - intubated on low dose sedation, open eyes on pain stimulation, b/l eyelid swollen, PERRL, eye midposition, sluggish doll's eye. Not blinking to visual threat, not tracking. Left corneal positive, and right absent. Gag positive. Facial symmetry not able to test due to ET. On pain stimulation, LUE localized to pain but not able to against gravity. LLE mild withdraw. RUE and RLE flaccid. B/l babinski positive. Sensation, coordination and gait not tested.    Lipid Panel    Component Value Date/Time   CHOL 138  11/28/2017 0832   TRIG 414 (H) 11/30/2017 1658   HDL 42 11/28/2017 0832   CHOLHDL 3.3 11/28/2017 0832   VLDL 32 11/28/2017 0832   LDLCALC 64 11/28/2017 0832   HgbA1C  Lab Results  Component Value Date   HGBA1C 5.8 (H) 11/28/2017    Urine Drug Screen:      Component Value Date/Time   LABOPIA NONE DETECTED 11/27/2017 1415   COCAINSCRNUR NONE DETECTED 11/27/2017 1415   LABBENZ NONE DETECTED 11/27/2017 1415   AMPHETMU POSITIVE (A) 11/27/2017 1415   THCU NONE DETECTED 11/27/2017 1415   LABBARB NONE DETECTED 11/27/2017 1415    Alcohol Level:  Recent Labs  Lab 11/27/17 0948  ETH <10    Ct Angio Head W Or Wo Contrast  IMPRESSION: 1. Positive for left MCA M1 large vessel occlusion, and also severe stenosis at the left ICA terminus, superimposed on the relatively large acute left hemisphere intra-axial hemorrhage (estimated blood volume 43 mL). Vasospasm suspected in the proximal ACAs. 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 2. Negative for intracranial aneurysm, CTA spot sign,  or evidence of vascular malformation in association with the acute hemorrhage. 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 3. Stable intracranial mass effect since 0938 hours today, with 16 mm of leftward midline shift and trapping of the right lateral ventricle.   Ct Head Wo Contrast  11/28/2017 IMPRESSION: 1. Stable when compared to yesterday. 2. Left MCA territory infarct with evacuated hematoma. Midline shift measures 8 mm. 3. 2-3 mm subdural hematoma along the right cerebral convexity without interval increase.   Ct Head Wo Contrast  11/27/2017 IMPRESSION: 1. Interval LEFT frontal craniotomy for basal ganglia hematoma evacuation, small amount of residual blood products and regional edema. 7 mm residual LEFT to RIGHT  midline shift, resolved RIGHT ventricular entrapment. 2. Trace RIGHT holo hemispheric and tentorial subdural hematomas. Global edema.   Ct Head Wo Contrast 11/27/2017 IMPRESSION: Large area of hemorrhage in the left basal ganglia and left frontal lobe, 5.8 x 3.5 cm. 15 mm of left-to-right midline shift.  Mr Brain Wo Contrast 11/28/2017 IMPRESSION: 1. Postoperative changes from previous left frontal craniotomy for evacuation of left basal ganglia hematoma. Residual blood products within the evacuation cavity with persistent surrounding vasogenic edema and regional mass effect. Associated 8-9 mm of right-to-left midline shift with mild left uncal herniation similar to previous. No hydrocephalus or ventricular trapping. 2. Underlying evolving large acute ischemic left MCA territory infarct involving the majority of the left MCA territory. 3. Trace right cerebral and tentorial subdural hematoma without mass effect. 4. Right sphenoid sinusitis.  Ct Head Wo Contrast  Result Date: 11/29/2017 CLINICAL DATA:  42 y/o  F; stroke for follow-up. EXAM: CT HEAD WITHOUT CONTRAST TECHNIQUE: Contiguous axial images were obtained from the base of the skull through the vertex without intravenous contrast. COMPARISON:  11/27/2017 MRI head.  11/28/2017 CT head. FINDINGS: Brain: Large left MCA infarction is stable in distribution. Interval left frontal craniectomy with partial herniation of the left frontal lobe through the craniectomy defect. Interval resolution of uncal herniation, improved patency of the left lateral ventricle, and 6 mm left-to-right midline shift, previously 8 mm. Partial interval dispersion of acute hemorrhage in the left basal ganglia post partial evacuation. There is a subcentimeter density anterior to the prior position of blood products possibly representing retracted clot or interval hemorrhage (series 3, image 19), attention at follow-up recommended. Vascular: No hyperdense vessel or unexpected  calcification. Skull: Interval left frontal craniectomy with edema and several foci of air in the overlying scalp as well as skin staples. Sinuses/Orbits: Possible right sphenoid sinus opacification with aerosolized secretions. Normal aeration of the mastoid air cells. Other: None. IMPRESSION: 1. Large left MCA infarct is stable in distribution. 2. Interval left frontal craniectomy with mild herniation of left frontal lobe through the defect. 3. Resolved uncal herniation, improved patency of left lateral ventricle, and 6 mm left-to-right midline shift, previously 8 mm. 4. Partial interval dispersion of acute hemorrhage in the left basal ganglia. Subcentimeter density anterior to the prior position of blood products possibly representing retracted clot or interval hemorrhage, attention at follow-up recommended. Electronically Signed   By: Kristine Garbe M.D.   On: 11/29/2017 06:00   Ct Head Wo Contrast  Result Date: 12/02/2017 CLINICAL DATA:  42 y/o  F; intracranial hemorrhage for follow-up. EXAM: CT HEAD WITHOUT CONTRAST TECHNIQUE: Contiguous axial images were obtained from the base of the skull through the vertex without intravenous contrast. COMPARISON:  11/29/2017 CT head. FINDINGS:  Brain: Stable distribution of left MCA infarction. Mild interval increase in edema a mildly increased herniation of the left frontal lobe the craniectomy. Stable 6 mm left-to-right midline shift and partial effacement of the left lateral ventricle. Stable hematoma within the left basal ganglia post partial evacuation. No new acute intracranial hemorrhage, stroke, or focal mass effect. Vascular: No hyperdense vessel or unexpected calcification. Skull: Left frontal craniectomy postsurgical changes. Decreased edema and interval resolution of soft tissue air and pneumocephalus. Sinuses/Orbits: Aerosolized secretions in the right sphenoid sinus. Additional visualized paranasal sinuses and the mastoid air cells are normally  aerated. Other: None. IMPRESSION: 1. Left MCA distribution infarction is stable in distribution. Mild increased edema of the infarct with increased herniation of the left frontal lobe via the left frontal craniectomy. 2. Stable 6 mm left-to-right midline shift and partial effacement of left lateral ventricle. 3. Stable small hematoma within the left basal ganglia. 4. No new acute intracranial abnormality identified. Electronically Signed   By: Kristine Garbe M.D.   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 15 mm of left-to-right midline shift.  MRI of the brain: Post craniotomy 8-9 mm of right-to-left midline shift with mild left uncal herniation similar to previous. Underlying evolving large acute ischemic left MCA territory infarct involving the majority of the left MCA territory.  Repeat Head CT post evacuation 9/20 - Left MCA territory infarct with evacuated hematoma. Midline shift measures 8 mm.   2D Echocardiogram EF 60 to 65%  LE venous doppler negative for DVT  Hypercoagulable labs negative except high ESR and CRP  Consider TEE once improved and stable  LDL 64  HgbA1c 5.8  VTE: Heparin subq  Antithrombotic: no anti-coagulation secondary to intracranial hemorrhage  Continue Rehab with PT consult, OT consult, Speech consult  Therapy recommendations: Pending  Disposition: Pending  Cerebral edema  CT head midline shift 60m with uncal herniation  Repeat CT head s/p hematoma evacuation -  midline shift 855m 3% saline on hold, now on 1/2NS  Na 153->155-> 154->159->156->156->159  S/p hemicrani 9/20  Repeat CT 9/21 - midline shift 45m71mOn keppra for seizure prevention  CT repeat 12/02/2017 stable midline shift, large left MCA infarct, residual hematoma   Respiratory failure  Intubated on sedation  CCM on board  Continue on vent  Tolerating weaning trials   Hypertension emergency  SBP stable off Cardene  BP goal <160   On norvasc and lisinopril  On labetalol to 300m69mtimes daily   Anemia  Hb 11.8 on admission  Hemoglobin 8.4 -> 7.7-> 8.2-> 8.1->7.2->6.9->PRBC->8.3  Received one unit PRBC overnight  Continue to monitor  Iron study pending  PRBC transfusion if Hb < 7.0   Other Stroke Risk Factors  ETOH use   Other Active Problems  ADHD - on Adderell at home  Anxiety  Depression  Hypokalemia, supplement  Leukocytosis WBC 21.5-16.8-> 14.4-> 13.3->13.4  Hospital day # 6  This patient is critically ill due to large left MCA infarct, uncal herniation s/p hemicrani, large hemorrhagic  conversion, intubated and at significant risk of neurological worsening, death form stroke recurrence, hematoma expansion, cerebral edema and uncal herniation, seizure. This patient's care requires constant monitoring of vital signs, hemodynamics, respiratory and cardiac monitoring, review of multiple databases, neurological assessment, discussion with family, other specialists and medical decision making of high complexity. I spent 35 minutes of neurocritical care time in the care of this patient.  Rosalin Hawking, MD PhD Stroke Neurology 12/03/2017 1:14 PM     To contact Stroke Continuity provider, please refer to [email or web address] After hours, contact General Neurology

## 2017-12-03 NOTE — Progress Notes (Signed)
In pt room, pt suddenly deviated to right side with right gaze deviation, stiffness of left extremities. Noticed blood in pt mouth, mostly likely from biting tongue during possible seizure. MD made aware. PRN ativan ordered for continued seizure or another occurrence. Keppra increased from 500 to 1000 mg. Continuous EEG ordered. Vitals stable at this time. Will continue to monitor pt.

## 2017-12-03 NOTE — Progress Notes (Signed)
vLTM EEG running. Tested event button, educated nurse, Electrodes moved due to incision: F3, F4,C3, C4. FZ, PZ moved 1 cent posterior t3 and t4 1/2 cent posterior.  Notified Neuro

## 2017-12-03 NOTE — Progress Notes (Signed)
On call cross cover note  Hb<7 Transfuse one unit PRBC  -- Milon DikesAshish Sam Wunschel, MD [facility] Pager: [phone] If 7pm to 7am, please call on call as listed on AMION.

## 2017-12-03 NOTE — Progress Notes (Signed)
NAME:  Tammie Miles, MRN:  [id number], DOB:  Nov 15, 1975, LOS: 6 ADMISSION DATE:  11/27/2017, CONSULTATION DATE:  11/27/2017 REFERRING MD:  Dr. Conchita Paris CHIEF COMPLAINT:  Left frontal hemorrhage  Brief History   42 yoF with AMS since 9/18 evening, thought due to intoxication. Still unresponsive this am.  Patient was brought to the ED where a CT was done and showed a large left sided ICH.  Patient was taken to the OR and a mini craniectomy was done.  Patient was brought out to the ICU and PCCM was consulted for vent management. Course complicated by cerebral edema requiring 3% saline limited by hypernatremia. Also complicated by encephalopathy. Tolerating vent wean, but mental status limited extubation.  Significant Hospital Events   9/19 Admit/ OR- mini crani 9/20>> Decompressive craniectomy ( L)  with skull harvesting in the abdomen  Consults: date of consult/date signed off & final recs:  9/19 NSGY 9/19 PCCM   Procedures (surgical and bedside):  9/19 Intubated 9/19 OR for crani, evac of hematoma>> no change in neuro status post op 9/20 Decompressive craniectomy ( L)  with skull harvesting in the abdomen  Significant Diagnostic Tests:  9/19 [facility]  Large area of hemorrhage in the left basal ganglia and left frontal lobe, 5.8 x 3.5 cm. 15 mm of left-to-right midline shift. 9/19 CTA head: Positive for left MCA M1 large vessel occlusion, and also severe stenosis at the left ICA terminus, superimposed on the relatively large acute left hemisphere intra-axial hemorrhage (estimated blood volume 43 mL). Vasospasm suspected in the proximal ACAs. 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 2. Negative for intracranial aneurysm, CTA spot sign, or evidence of vascular malformation in association with the acute hemorrhage. However, mild fusiform aneurysmal enlargement is noted in both distal cervical ICAs (greater on the right, 8 mm  diameter). 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 3. Stable intracranial mass effect since 0938 hours today, with 16 mm of leftward midline shift and trapping of the right lateral ventricle. 11/29/2017 CT Head Large left MCA infarct is stable in distribution. Interval left frontal craniectomy with mild herniation of left frontal lobe through the defect. Resolved uncal herniation, improved patency of left lateral ventricle, and 6 mm left-to-right midline shift, previously 8 mm. Partial interval dispersion of acute hemorrhage in the left basal ganglia. Subcentimeter density anterior to the prior position of blood products possibly representing retracted clot or interval hemorrhage, attention at follow-up recommended. CT 9/24: 1. Left MCA distribution infarction is stable in distribution. Mild increased edema of the infarct with increased herniation of the left frontal lobe via the left frontal craniectomy. 2. Stable 6 mm left-to-right midline shift and partial effacement of left lateral ventricle. 3. Stable small hematoma within the left basal ganglia. 4. No new acute intracranial abnormality identified.  Micro Data: 9/19 MRSA PCR >> Negative 9/19 Sputum Culture>> normal flora 9/22 Blood x 2>> 9/22 UA>> Normal 9/22>> Sputum>> few candida dubliniensis  Antimicrobials:  9/19 Cefazolin (pre-op); 9/20;   Subjective:  No meaningful change in exam per RN. Anemic overnight requiring 1 unit PRBC. Heavy menses per RN as well.   Objective   Blood pressure (S) (!) 161/87, pulse (!) 59, temperature 98.7 F (37.1 C), temperature source Rectal, resp. rate (!) 24, height 5\' 1"  (1.549 m), weight 78.4 kg, SpO2 100 %.    Vent Mode: CPAP;PSV FiO2 (%):  [30 %] 30 % Set Rate:  [16 bmp] 16 bmp Vt Set:  [380 mL]  380 mL PEEP:  [5 cmH20] 5 cmH20 Pressure Support:  [8 cmH20] 8 cmH20   Intake/Output Summary (Last 24 hours) at 12/03/2017 0848 Last data filed at 12/03/2017 0700 Gross per 24 hour  Intake 1601.96 ml    Output 2486 ml  Net -884.04 ml   Filed Weights   12/01/17 0500 12/02/17 0200 12/03/17 0200  Weight: 75.8 kg 80.4 kg 78.4 kg    Examination: General:  42 Adult female on vent.  HEENT: Crani site CDI. PERRL. MMM. ETT in place.  Neuro: Withdrawals lower extremities.  CV: SR, 69, +2 pulses PULM: Continues to tolerate PSV. Clear Extremities: warm/dry, edema to RUE.  Skin: no rashes   Resolved Hospital Problem list    Assessment & Plan:  Acute encephalopathy related to Large Left frontal IPH - S/p OR for mini craniotomy 9/19>> repeat OR 9/20 for crani/ flap >> swelling P:  Neurology primary Postop per NSGY SBP goal  < 160 mmHg: Oral meds and nicardipine PRN.  3% management to neuro, goal Na 155-160. Currently 159, treatment on hold.  Hourly neuro checks Continue keppra/ seizure precautions PAD protocol with fentanyl gtt, weaning. Down to .   Hypertensive crisis, hx HTN P:  Tele monitoring SBP goal as above Continue norvasc 10mg  daily, Labetalol 100mg  TID, and lisinopril 20mg  BID. BB held today due to HR 60.  Monitor renal function daily, currently normal  Daily I/O/ wts Continue Lasix 20mg  with KCL 40 meq  Acute respiratory insufficiency in the setting of massive IPH P : Continue SBT as she will tolerate mental status remains barrier to extubation Intermittent CXR VAP measures  Will likely need trach sooner than later, should family pursue this route. Difficulty contacting son.   Fever-  Leukocytosis - improving - likely neuro driven but need to RO bacterial source - Sputum culture with rare candida.  Plan: Continue to monitor clinically off abx for now PCT 0.13, remains reassuring  Intermittent CXR Tylenol for fever/ Cooling blanket >> monitor temp  Hypokalemia Hypo phos Hyperchloremia - 3% currently held, at goal 155-160 P : Lasix and KCL as above Na q 6hrs Trend renal panel/ mag / urinary output  Anemia - Transfused one unit PRBC 9/25 for Hgb 6.9.  Only evidence of bleeding is menses.  Plan: Trend CBC Transfuse fof HGB < 7 SCDs only  If menses does not improve, may need GYN evaluation.   Protein calorie malnutrition Plan: Continue TF at goal  CBG's Q 4 SSI coverage- standard SUP Pepcid  Possible ETOH abuse - unclear usage P:  Continue empiric thiamine/ folate  Hx ADHD/ depression / anxiety P:  Will address as mental status improves  Disposition / Summary of Today's Plan 12/03/17   Continue SBT, mental status is barrier to extubation. Need to get in touch with Son.     Diet: TF  Pain/Anxiety/Delirium protocol: fentanyl gtt- weaning DVT prophylaxis: SCDs only  GI prophylaxis: pepcid  Hyperglycemia protocol: CBG q 4; SS standard Mobility: BR Code Status: full Family Communication:  Has 54 year old daughter and boyfriend who is father.  Estranged adult son.  CSW to help establish next of kin/ GOC  Labs   CBC: Recent Labs  Lab 11/29/17 1742 11/30/17 0521 12/01/17 0521 12/02/17 0518 12/03/17 0012 12/03/17 0515  WBC 21.5* 16.8* 14.4* 13.3*  --  13.4*  HGB 8.6* 8.2* 8.1* 7.2* 6.9* 8.3*  HCT 27.7* 27.2* 26.2* 24.3* 23.8* 28.0*  MCV 80.5 80.7 79.6 80.2  --  83.3  PLT 269 235  280 246  --  227   Basic Metabolic Panel: Recent Labs  Lab 11/29/17 0412  11/30/17 0521  11/30/17 1658  12/01/17 0521  12/02/17 0518 12/02/17 1358 12/02/17 1828 12/03/17 0012 12/03/17 0515  NA 153*   < > 154*  156*   < >  --    < > 159*   < > 156* 160* 162* 159* 159*  K 3.8  --  3.0*  --   --   --  3.3*  --  3.5  --   --   --  4.0  CL 126*  --  122*  --   --   --  125*  --  125*  --   --   --  125*  CO2 21*  --  25  --   --   --  23  --  25  --   --   --  25  GLUCOSE 144*  --  141*  --   --   --  134*  --  142*  --   --   --  126*  BUN 10  --  13  --   --   --  19  --  26*  --   --   --  31*  CREATININE 0.73  --  0.63  --   --   --  0.76  --  0.80  --   --   --  0.78  CALCIUM 7.6*  --  7.9*  --   --   --  7.9*  --  8.0*  --   --    --  8.4*  MG 2.1   < > 2.1  --  2.2  --  2.1  --  2.3  --   --   --  2.6*  PHOS 1.2*   < > 1.2*  --  2.1*  --  2.8  --  3.6  --   --   --  4.6   < > = values in this interval not displayed.   GFR: Estimated Creatinine Clearance: 86.8 mL/min (by C-G formula based on SCr of 0.78 mg/dL). Recent Labs  Lab 11/27/17 0949 11/27/17 1552  11/29/17 1524  11/30/17 0521 12/01/17 0521 12/02/17 0518 12/03/17 0515  PROCALCITON  --  <0.10  --   --   --  0.24 0.14 0.13  --   WBC  --   --    < >  --    < > 16.8* 14.4* 13.3* 13.4*  LATICACIDVEN 4.07* 4.3*  --  1.9  --  1.3  --   --   --    < > = values in this interval not displayed.   Liver Function Tests: Recent Labs  Lab 11/27/17 0933  11/29/17 0412 11/30/17 0521 12/01/17 0521 12/02/17 0518 12/03/17 0515  AST 24  --  24 30 60*  --   --   ALT 15  --  12 15 38  --   --   ALKPHOS 83  --  53 66 104  --   --   BILITOT 1.2  --  0.4 0.6 0.6  --   --   PROT 7.6  --  5.4* 6.2* 6.0*  --   --   ALBUMIN 3.8   < > 2.4* 2.2* 2.1* 1.9* 1.9*   < > = values in this interval not displayed.   No results for input(s): LIPASE, AMYLASE in the  last 168 hours. No results for input(s): AMMONIA in the last 168 hours. ABG    Component Value Date/Time   PHART 7.335 (L) 11/29/2017 0310   PCO2ART 36.6 11/29/2017 0310   PO2ART 86.0 11/29/2017 0310   HCO3 19.5 (L) 11/29/2017 0310   TCO2 21 (L) 11/29/2017 0310   ACIDBASEDEF 6.0 (H) 11/29/2017 0310   O2SAT 96.0 11/29/2017 0310    Coagulation Profile: Recent Labs  Lab 11/27/17 0933  INR 1.06   Cardiac Enzymes: No results for input(s): CKTOTAL, CKMB, CKMBINDEX, TROPONINI in the last 168 hours. HbA1C: Hgb A1c MFr Bld  Date/Time Value Ref Range Status  11/28/2017 08:32 AM 5.8 (H) 4.8 - 5.6 % Final    Comment:    (NOTE) Pre diabetes:          5.7%-6.4% Diabetes:              >6.4% Glycemic control for   <7.0% adults with diabetes    CBG: Recent Labs  Lab 12/02/17 1534 12/02/17 1935  12/02/17 2349 12/03/17 0340 12/03/17 0732  GLUCAP 113* 111* 122* 104* [address], AGACNP-BC [place] [facility] Pager [phone] or [phone]  12/03/2017 9:08 AM

## 2017-12-03 NOTE — Plan of Care (Signed)
Notified by RN that pt had sudden onset right eye deviation, whole body stiffness, tongue biting with bleeding, lasting 1-2 min, concerning for seizure activity. Pt does have high risk for seizure given large left MCA infarct, hemorrhagic conversion and s/p left hemicraniectomy. She is already on keppra 500mg  bid, but will increase to 1000mg  bid first dose now. Will do LTM EEG overnight, will give ativan 2mg  iv if continued seizure activity. Seizure precautions.   Marvel Plan, MD PhD Stroke Neurology 12/03/2017 2:58 PM

## 2017-12-04 LAB — RENAL FUNCTION PANEL
ANION GAP: 5 (ref 5–15)
Albumin: 2 g/dL — ABNORMAL LOW (ref 3.5–5.0)
BUN: 27 mg/dL — ABNORMAL HIGH (ref 6–20)
CHLORIDE: 122 mmol/L — AB (ref 98–111)
CO2: 26 mmol/L (ref 22–32)
Calcium: 9 mg/dL (ref 8.9–10.3)
Creatinine, Ser: 0.64 mg/dL (ref 0.44–1.00)
GFR calc Af Amer: >60 mL/min (ref >60)
GFR calc non Af Amer: >60 mL/min (ref >60)
GLUCOSE: 128 mg/dL — AB (ref 70–99)
POTASSIUM: 4 mmol/L (ref 3.5–5.1)
Phosphorus: 4.3 mg/dL (ref 2.5–4.6)
Sodium: 153 mmol/L — ABNORMAL HIGH (ref 135–145)

## 2017-12-04 LAB — GLUCOSE, CAPILLARY
GLUCOSE-CAPILLARY: 111 mg/dL — AB (ref 70–99)
Glucose-Capillary: 116 mg/dL — ABNORMAL HIGH (ref 70–99)
Glucose-Capillary: 105 mg/dL — ABNORMAL HIGH (ref 70–99)
Glucose-Capillary: 106 mg/dL — ABNORMAL HIGH (ref 70–99)

## 2017-12-04 LAB — IRON AND TIBC
Iron: 36 ug/dL (ref 28–170)
SATURATION RATIOS: 13 % (ref 10.4–31.8)
TIBC: 267 ug/dL (ref 250–450)
UIBC: 231 ug/dL

## 2017-12-04 LAB — CBC
HCT: 28.8 % — ABNORMAL LOW (ref 36.0–46.0)
HEMOGLOBIN: 8.6 g/dL — AB (ref 12.0–15.0)
MCH: 25 pg — AB (ref 26.0–34.0)
MCHC: 29.9 g/dL — AB (ref 30.0–36.0)
MCV: 83.7 fL (ref 78.0–100.0)
Platelets: 228 10*3/uL (ref 150–400)
RBC: 3.44 MIL/uL — ABNORMAL LOW (ref 3.87–5.11)
RDW: 20 % — ABNORMAL HIGH (ref 11.5–15.5)
WBC: 13.5 10*3/uL — ABNORMAL HIGH (ref 4.0–10.5)

## 2017-12-04 LAB — SODIUM
SODIUM: 153 mmol/L — AB (ref 135–145)
Sodium: 154 mmol/L — ABNORMAL HIGH (ref 135–145)
Sodium: 153 mmol/L — ABNORMAL HIGH (ref 135–145)

## 2017-12-04 LAB — TSH: TSH: 1.898 u[IU]/mL (ref 0.350–4.500)

## 2017-12-04 LAB — FERRITIN: FERRITIN: 66 ng/mL (ref 11–307)

## 2017-12-04 MED ORDER — FERROUS SULFATE 325 (65 FE) MG PO TABS
325.0000 mg | ORAL_TABLET | Freq: Two times a day (BID) | ORAL | Status: DC
  Administered 2017-12-04 – 2018-01-08 (×68): 325 mg via ORAL
  Filled 2017-12-04 (×68): qty 1

## 2017-12-04 MED ORDER — POTASSIUM CHLORIDE 20 MEQ/15ML (10%) PO SOLN
40.0000 meq | Freq: Once | ORAL | Status: AC
  Administered 2017-12-04: 40 meq
  Filled 2017-12-04: qty 30

## 2017-12-04 MED ORDER — ASPIRIN 81 MG PO CHEW
81.0000 mg | CHEWABLE_TABLET | Freq: Every day | ORAL | Status: DC
  Administered 2017-12-05 – 2018-01-08 (×35): 81 mg
  Filled 2017-12-04 (×35): qty 1

## 2017-12-04 MED ORDER — SODIUM CHLORIDE 0.9 % IV SOLN
INTRAVENOUS | Status: DC | PRN
  Administered 2017-12-04 – 2017-12-07 (×3): via INTRAVENOUS
  Administered 2017-12-08: 1000 mL via INTRAVENOUS
  Administered 2017-12-08 – 2017-12-13 (×6): via INTRAVENOUS

## 2017-12-04 MED ORDER — FUROSEMIDE 10 MG/ML IJ SOLN
40.0000 mg | Freq: Once | INTRAMUSCULAR | Status: AC
  Administered 2017-12-04: 40 mg via INTRAVENOUS
  Filled 2017-12-04: qty 4

## 2017-12-04 NOTE — Progress Notes (Signed)
NEUROHOSPITALISTS STROKE TEAM - DAILY PROGRESS NOTE   SUBJECTIVE No family is at bedside. Just off fentanyl this am. Able to open eyes on voice. Had seizure activity yesterday afternoon but no more since. TEE overnight showed no seizure.  Blood pressure fluctuate, however does not need IV drip.  Sodium 153, changed to normal saline.  Anemia and leukocytosis stable.  OBJECTIVE Most recent Vital Signs: Vitals:   12/04/17 1300 12/04/17 1400 12/04/17 1500 12/04/17 1600  BP: (!) 143/86 130/79 135/84 (!) 153/86  Pulse: 70 68 72 64  Resp: 19 (!) 21 (!) 21 (!) 27  Temp:    100 F (37.8 C)  TempSrc:    Rectal  SpO2: 100% 100% 100% 100%  Weight:      Height:       CBG (last 3)  Recent Labs    12/04/17 0823 12/04/17 1112 12/04/17 1546  GLUCAP 105* 111* 106*    Physical Exam  Temp:  [97.9 F (36.6 C)-100 F (37.8 C)] 100 F (37.8 C) (09/26 1600) Pulse Rate:  [57-88] 64 (09/26 1600) Resp:  [12-27] 27 (09/26 1600) BP: (130-190)/(78-106) 153/86 (09/26 1600) SpO2:  [100 %] 100 % (09/26 1600) FiO2 (%):  [30 %] 30 % (09/26 1528) Weight:  [77 kg] 77 kg (09/26 0200)   HEENT-status post craniotomy with wound clean dry and stitches intact  cardiovascular- S1-S2 audible, pulses palpable throughout   Lungs- low breath sounds bilaterally Abdomen-hypoactive bowel sounds  musculoskeletal-no joint tenderness, deformity or swelling Skin-warm and dry Neuro - intubated chest off sedation, able to open eyes on pain stimulation, b/l eyelid swollen, improved, PERRL, eye midposition, sluggish doll's eye. Not blinking to visual threat, not tracking. Left corneal positive, and right absent. Gag positive. Facial symmetry not able to test due to ET. On pain stimulation, LUE localized to pain and able to against gravity. LLE withdraw to pain 2/5. RUE flaccid and RLE mild withdraw to pain. B/l babinski positive. Sensation, coordination and gait not  tested.    Lipid Panel    Component Value Date/Time   CHOL 138 11/28/2017 0832   TRIG 414 (H) 11/30/2017 1658   HDL 42 11/28/2017 0832   CHOLHDL 3.3 11/28/2017 0832   VLDL 32 11/28/2017 0832   LDLCALC 64 11/28/2017 0832   HgbA1C  Lab Results  Component Value Date   HGBA1C 5.8 (H) 11/28/2017    Urine Drug Screen:      Component Value Date/Time   LABOPIA NONE DETECTED 11/27/2017 1415   COCAINSCRNUR NONE DETECTED 11/27/2017 1415   LABBENZ NONE DETECTED 11/27/2017 1415   AMPHETMU POSITIVE (A) 11/27/2017 1415   THCU NONE DETECTED 11/27/2017 1415   LABBARB NONE DETECTED 11/27/2017 1415    Alcohol Level:  No results for input(s): ETH in the last 168 hours.  Ct Angio Head W Or Wo Contrast  IMPRESSION: 1. Positive for left MCA M1 large vessel occlusion, and also severe stenosis at the left ICA terminus, superimposed on the relatively large acute left hemisphere intra-axial hemorrhage (estimated blood volume 43 mL). Vasospasm suspected in the proximal ACAs. 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 2. Negative for intracranial aneurysm, CTA  spot sign, or evidence of vascular malformation in association with the acute hemorrhage. 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 3. Stable intracranial mass effect since 0938 hours today, with 16 mm of leftward midline shift and trapping of the right lateral ventricle.   Ct Head Wo Contrast  11/28/2017 IMPRESSION: 1. Stable when compared to yesterday. 2. Left MCA territory infarct with evacuated hematoma. Midline shift measures 8 mm. 3. 2-3 mm subdural hematoma along the right cerebral convexity without interval increase.   Ct Head Wo Contrast  11/27/2017 IMPRESSION: 1. Interval LEFT frontal craniotomy for basal ganglia hematoma evacuation, small amount of  residual blood products and regional edema. 7 mm residual LEFT to RIGHT midline shift, resolved RIGHT ventricular entrapment. 2. Trace RIGHT holo hemispheric and tentorial subdural hematomas. Global edema.   Ct Head Wo Contrast 11/27/2017 IMPRESSION: Large area of hemorrhage in the left basal ganglia and left frontal lobe, 5.8 x 3.5 cm. 15 mm of left-to-right midline shift.  Mr Brain Wo Contrast 11/28/2017 IMPRESSION: 1. Postoperative changes from previous left frontal craniotomy for evacuation of left basal ganglia hematoma. Residual blood products within the evacuation cavity with persistent surrounding vasogenic edema and regional mass effect. Associated 8-9 mm of right-to-left midline shift with mild left uncal herniation similar to previous. No hydrocephalus or ventricular trapping. 2. Underlying evolving large acute ischemic left MCA territory infarct involving the majority of the left MCA territory. 3. Trace right cerebral and tentorial subdural hematoma without mass effect. 4. Right sphenoid sinusitis.  Ct Head Wo Contrast  Result Date: 11/29/2017 CLINICAL DATA:  42 y/o  F; stroke for follow-up. EXAM: CT HEAD WITHOUT CONTRAST TECHNIQUE: Contiguous axial images were obtained from the base of the skull through the vertex without intravenous contrast. COMPARISON:  11/27/2017 MRI head.  11/28/2017 CT head. FINDINGS: Brain: Large left MCA infarction is stable in distribution. Interval left frontal craniectomy with partial herniation of the left frontal lobe through the craniectomy defect. Interval resolution of uncal herniation, improved patency of the left lateral ventricle, and 6 mm left-to-right midline shift, previously 8 mm. Partial interval dispersion of acute hemorrhage in the left basal ganglia post partial evacuation. There is a subcentimeter density anterior to the prior position of blood products possibly representing retracted clot or interval hemorrhage (series 3, image 19), attention at  follow-up recommended. Vascular: No hyperdense vessel or unexpected calcification. Skull: Interval left frontal craniectomy with edema and several foci of air in the overlying scalp as well as skin staples. Sinuses/Orbits: Possible right sphenoid sinus opacification with aerosolized secretions. Normal aeration of the mastoid air cells. Other: None. IMPRESSION: 1. Large left MCA infarct is stable in distribution. 2. Interval left frontal craniectomy with mild herniation of left frontal lobe through the defect. 3. Resolved uncal herniation, improved patency of left lateral ventricle, and 6 mm left-to-right midline shift, previously 8 mm. 4. Partial interval dispersion of acute hemorrhage in the left basal ganglia. Subcentimeter density anterior to the prior position of blood products possibly representing retracted clot or interval hemorrhage, attention at follow-up recommended. Electronically Signed   By: Kristine Garbe M.D.   On: 11/29/2017 06:00   Ct Head Wo Contrast  Result Date: 12/02/2017 CLINICAL DATA:  42 y/o  F; intracranial hemorrhage for follow-up. EXAM: CT HEAD WITHOUT CONTRAST TECHNIQUE: Contiguous axial images were obtained from the base of the skull through the vertex without intravenous contrast. COMPARISON:  11/29/2017 CT  head. FINDINGS: Brain: Stable distribution of left MCA infarction. Mild interval increase in edema a mildly increased herniation of the left frontal lobe the craniectomy. Stable 6 mm left-to-right midline shift and partial effacement of the left lateral ventricle. Stable hematoma within the left basal ganglia post partial evacuation. No new acute intracranial hemorrhage, stroke, or focal mass effect. Vascular: No hyperdense vessel or unexpected calcification. Skull: Left frontal craniectomy postsurgical changes. Decreased edema and interval resolution of soft tissue air and pneumocephalus. Sinuses/Orbits: Aerosolized secretions in the right sphenoid sinus. Additional  visualized paranasal sinuses and the mastoid air cells are normally aerated. Other: None. IMPRESSION: 1. Left MCA distribution infarction is stable in distribution. Mild increased edema of the infarct with increased herniation of the left frontal lobe via the left frontal craniectomy. 2. Stable 6 mm left-to-right midline shift and partial effacement of left lateral ventricle. 3. Stable small hematoma within the left basal ganglia. 4. No new acute intracranial abnormality identified. Electronically Signed   By: Kristine Garbe M.D.   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 15 mm of left-to-right midline shift.  MRI of the brain: Post craniotomy 8-9 mm of right-to-left midline shift with mild left uncal herniation similar to previous. Underlying evolving large acute ischemic left MCA territory infarct involving the majority of the left MCA territory.  Repeat Head CT post evacuation 9/20 - Left MCA territory infarct with evacuated hematoma. Midline shift measures 8 mm.   2D Echocardiogram EF 60 to 65%  LE venous doppler negative for DVT  Hypercoagulable labs negative except high ESR and CRP  May consider TEE once improved and stable  LDL 64  HgbA1c 5.8  VTE: Heparin subq  Antithrombotic: no antithrombotics secondary to [place], will start ASA 81 tomorrow at 7 days post hemorrhagic infarct  Continue Rehab with PT consult, OT consult, Speech consult  Therapy recommendations: Pending  Disposition:  Pending  Cerebral edema  CT head midline shift 41m with uncal herniation  Repeat CT head s/p hematoma evacuation - midline shift 861m S/p hemicrani 9/20  Repeat CT 9/21 - midline shift 41m65mOn keppra for seizure prevention  CT repeat 12/02/2017 stable midline shift, large left MCA infarct, residual hematoma  3% saline d/c'ed, now on NS  Na 153->155-> 154->159->156->156->159->153   Respiratory failure  Intubated just off sedation  CCM on board  Continue on vent  Tolerating weaning trials  Likely need trach and PEG   Hypertension emergency  SBP stable off Cardene  BP goal <160   On norvasc and lisinopril  On labetalol to 300m28mtimes daily   Anemia  Hb 11.8 on admission  Hemoglobin 8.4 -> 7.7-> 8.2-> 8.1->7.2->6.9->PRBC->8.3->8.6  Received one unit PRBC overnight  Continue to monitor  Iron study iron 36 and ferritin 66  Put on iron pills  PRBC transfusion if Hb < 7.0   Other Stroke Risk Factors  ETOH use   Other Active Problems  ADHD - on Adderell at home  Anxiety  Depression  Hypokalemia, supplement  Leukocytosis WBC 21.5-16.8-> 14.4-> 13.3->13.4->13.5 (no fever)  Hospital day # 7  This patient is critically ill due to large left MCA infarct, uncal herniation s/p hemicrani, large hemorrhagic conversion, intubated and at significant risk of neurological worsening, death form stroke recurrence, hematoma expansion, cerebral edema and uncal herniation, seizure. This patient's care requires constant monitoring of vital signs, hemodynamics, respiratory and cardiac monitoring, review of multiple databases, neurological assessment, discussion with family, other specialists and medical decision making of high complexity. I spent 35 minutes of neurocritical care time in the care of this patient.  Rosalin Hawking, MD PhD Stroke Neurology 12/04/2017 5:05 PM     To contact Stroke Continuity provider, please refer to [email or web address] After hours, contact  General Neurology

## 2017-12-04 NOTE — Progress Notes (Signed)
vLTM EEG complete. No skin breakdown 

## 2017-12-04 NOTE — Progress Notes (Signed)
NAME:  Tammie Miles, MRN:  [id number], DOB:  [birth year], LOS: 7 ADMISSION DATE:  11/27/2017, CONSULTATION DATE:  11/27/2017 REFERRING MD:  Dr. Conchita Paris CHIEF COMPLAINT:  Left frontal hemorrhage  Brief History   46 yoF with AMS since 9/18 evening, thought due to intoxication. Still unresponsive this am.  Patient was brought to the ED where a CT was done and showed a large left sided ICH.  Patient was taken to the OR and a mini craniectomy was done.  Patient was brought out to the ICU and PCCM was consulted for vent management. Course complicated by cerebral edema requiring 3% saline limited by hypernatremia. Also complicated by encephalopathy. Tolerating vent wean, but mental status limited extubation. Then 9/25 she developed what appeared to be seizures with gaze deviation and tongue biting. EEG done overnight with results pending and AEDs adjusted.   Significant Hospital Events   9/19 Admit/ OR- mini crani 9/20>> Decompressive craniectomy ( L)  with skull harvesting in the abdomen  Consults: date of consult/date signed off & final recs:  9/19 NSGY 9/19 PCCM   Procedures (surgical and bedside):  9/19 Intubated 9/19 OR for crani, evac of hematoma>> no change in neuro status post op 9/20 Decompressive craniectomy ( L)  with skull harvesting in the abdomen  Significant Diagnostic Tests:  9/19 [facility]  Large area of hemorrhage in the left basal ganglia and left frontal lobe, 5.8 x 3.5 cm. 15 mm of left-to-right midline shift. 9/19 CTA head: Positive for left MCA M1 large vessel occlusion, and also severe stenosis at the left ICA terminus, superimposed on the relatively large acute left hemisphere intra-axial hemorrhage (estimated blood volume 43 mL). Vasospasm suspected in the proximal ACAs. 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 2. Negative for intracranial aneurysm, CTA spot sign, or evidence of vascular malformation in  association with the acute hemorrhage. 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 3. Stable intracranial mass effect since 0938 hours today, with 16 mm of leftward midline shift and trapping of the right lateral ventricle. 11/29/2017 CT Head Large left MCA infarct is stable in distribution. Interval left frontal craniectomy with mild herniation of left frontal lobe through the defect. Resolved uncal herniation, improved patency of left lateral ventricle, and 6 mm left-to-right midline shift, previously 8 mm. Partial interval dispersion of acute hemorrhage in the left basal ganglia. Subcentimeter density anterior to the prior position of blood products possibly representing retracted clot or interval hemorrhage, attention at follow-up recommended. CT 9/24: 1. Left MCA distribution infarction is stable in distribution. Mild increased edema of the infarct with increased herniation of the left frontal lobe via the left frontal craniectomy. 2. Stable 6 mm left-to-right midline shift and partial effacement of left lateral ventricle. 3. Stable small hematoma within the left basal ganglia. 4. No new acute intracranial abnormality identified.  Micro Data: 9/19 MRSA PCR >> Negative 9/19 Sputum Culture >> normal flora 9/22 Blood x 2 >> 9/22 UA >> Normal 9/22 >> Sputum >> few candida dubliniensis  Antimicrobials:  9/19 Cefazolin (pre-op); 9/20;   Subjective:  Seizure like activity last night started on EEG and Keppra increased. No other change.   Objective   Blood pressure (!) 154/95, pulse 60, temperature 98.2 F (36.8 C), temperature source Rectal, resp. rate 14, height 5\' 1"  (1.549 m), weight 77 kg, SpO2 100 %.  Vent Mode: PRVC FiO2 (%):  [30 %] 30 % Set Rate:  [16 bmp] 16 bmp Vt Set:  [380 mL] 380 mL PEEP:  [5 cmH20] 5 cmH20 Pressure Support:  [8 cmH20] 8 cmH20   Intake/Output  Summary (Last 24 hours) at 12/04/2017 0759 Last data filed at 12/04/2017 0700 Gross per 24 hour  Intake 2327.37 ml  Output 3526 ml  Net -1198.63 ml   Filed Weights   12/02/17 0200 12/03/17 0200 12/04/17 0200  Weight: 80.4 kg 78.4 kg 77 kg    Examination: General:  Adult female on vent.  HEENT: Crani site CDI Neuro: Opens eyes to tactile. Does not interact or follow commands.   CV: RRR, no MRG PULM: Tolerating PSV well. Clear bilateral breath sounds Extremities: warm/dry, edema to RUE and both lower extremities to a lesser extentd. .  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 Now status post hemicraniotomy for evacuation. Complicated by cerebral edema requiring hypertonic saline.  P:  Neurology primary Postop per NSGY SBP goal  < 160 mmHg: Oral meds and nicardipine PRN. Holding goal well.  3% management to neuro, goal Na 155-160. Currently 153. Hourly neuro checks Keppra increased yesterday.  EEG pending.  May need TEE if improves.  PAD protocol with fentanyl gtt, weaning. Down to .  Passive ROM via RN  Hypertensive crisis, hx HTN P:  Tele monitoring SBP goal as above Continue norvasc 10mg  daily, Labetalol 100mg  TID, and lisinopril 20mg  BID. BB held today due to HR 60.  Monitor renal function daily, currently normal  Daily I/O/ wts Lasix 40mg  with KCL 40 meq  Acute respiratory insufficiency in the setting of massive IPH P : Continue SBT as she will tolerate mental status remains barrier to extubation Intermittent CXR VAP measures  Will likely need trach sooner than later, should family pursue this route. Difficulty contacting son.   Fever- improved Leukocytosis -stable - likely neuro driven. PCT 0.13, remains reassuring. Sputum culture with few candida.  Plan: Continue to monitor clinically off abx for now Intermittent CXR Tylenol for  fever  Hypokalemia Hypo phos Hyperchloremia P : Lasix and KCL as above Na q 6hrs Trend renal panel/ mag / urinary output  Hypernatremia: iatrogenic - hypertonic therapies per neurology.  - Trend Na.   Anemia - Transfused one unit PRBC 9/25 for Hgb 6.9. Only evidence of bleeding is menses.  Plan: Trend CBC Transfuse for HGB < 7 SCDs only  If menses does not improve over a period of days, may need transvaginal US/GYN evaluation.   Protein calorie malnutrition Plan: Continue TF at goal  CBG's Q 4 SSI coverage - standard SUP Pepcid  Possible ETOH abuse - unclear usage P:  Continue empiric thiamine/ folate  Hx ADHD/ depression / anxiety P:  Will address as mental status improves  Disposition / Summary of Today's Plan 12/04/17   Follow EEG results.     Diet: TF  Pain/Anxiety/Delirium protocol: fentanyl gtt- weaning DVT prophylaxis: SCDs only  GI prophylaxis: pepcid  Hyperglycemia protocol: CBG q 4; SS standard Mobility: BR Code Status: full Family Communication:  Left message for Son to return call. NO family present at bedside.   Labs   CBC: Recent Labs  Lab 11/30/17 0521 12/01/17 0521 12/02/17 0518 12/03/17 0012 12/03/17 0515 12/04/17 0507  WBC 16.8* 14.4* 13.3*  --  13.4* 13.5*  HGB 8.2* 8.1* 7.2*  6.9* 8.3* 8.6*  HCT 27.2* 26.2* 24.3* 23.8* 28.0* 28.8*  MCV 80.7 79.6 80.2  --  83.3 83.7  PLT 235 280 246  --  227 228   Basic Metabolic Panel: Recent Labs  Lab 11/30/17 0521  11/30/17 1658  12/01/17 0521  12/02/17 0518  12/03/17 0515 12/03/17 1151 12/03/17 1853 12/04/17 0041 12/04/17 0507  NA 154*  156*   < >  --    < > 159*   < > 156*   < > 159* 159* 156* 154* 153*  K 3.0*  --   --   --  3.3*  --  3.5  --  4.0  --   --   --  4.0  CL 122*  --   --   --  125*  --  125*  --  125*  --   --   --  122*  CO2 25  --   --   --  23  --  25  --  25  --   --   --  26  GLUCOSE 141*  --   --   --  134*  --  142*  --  126*  --   --   --  128*  BUN 13   --   --   --  19  --  26*  --  31*  --   --   --  27*  CREATININE 0.63  --   --   --  0.76  --  0.80  --  0.78  --   --   --  0.64  CALCIUM 7.9*  --   --   --  7.9*  --  8.0*  --  8.4*  --   --   --  9.0  MG 2.1  --  2.2  --  2.1  --  2.3  --  2.6*  --   --   --   --   PHOS 1.2*  --  2.1*  --  2.8  --  3.6  --  4.6  --   --   --  4.3   < > = values in this interval not displayed.   GFR: Estimated Creatinine Clearance: 86 mL/min (by C-G formula based on SCr of 0.64 mg/dL). Recent Labs  Lab 11/27/17 0949 11/27/17 1552  11/29/17 1524  11/30/17 0521 12/01/17 0521 12/02/17 0518 12/03/17 0515 12/04/17 0507  PROCALCITON  --  <0.10  --   --   --  0.24 0.14 0.13  --   --   WBC  --   --    < >  --    < > 16.8* 14.4* 13.3* 13.4* 13.5*  LATICACIDVEN 4.07* 4.3*  --  1.9  --  1.3  --   --   --   --    < > = values in this interval not displayed.   Liver Function Tests: Recent Labs  Lab 11/27/17 0933  11/29/17 0412 11/30/17 0521 12/01/17 0521 12/02/17 0518 12/03/17 0515 12/04/17 0507  AST 24  --  24 30 60*  --   --   --   ALT 15  --  12 15 38  --   --   --   ALKPHOS 83  --  53 66 104  --   --   --   BILITOT 1.2  --  0.4 0.6 0.6  --   --   --  PROT 7.6  --  5.4* 6.2* 6.0*  --   --   --   ALBUMIN 3.8   < > 2.4* 2.2* 2.1* 1.9* 1.9* 2.0*   < > = values in this interval not displayed.   No results for input(s): LIPASE, AMYLASE in the last 168 hours. No results for input(s): AMMONIA in the last 168 hours. ABG    Component Value Date/Time   PHART 7.335 (L) 11/29/2017 0310   PCO2ART 36.6 11/29/2017 0310   PO2ART 86.0 11/29/2017 0310   HCO3 19.5 (L) 11/29/2017 0310   TCO2 21 (L) 11/29/2017 0310   ACIDBASEDEF 6.0 (H) 11/29/2017 0310   O2SAT 96.0 11/29/2017 0310    Coagulation Profile: Recent Labs  Lab 11/27/17 0933  INR 1.06   Cardiac Enzymes: No results for input(s): CKTOTAL, CKMB, CKMBINDEX, TROPONINI in the last 168 hours. HbA1C: Hgb A1c MFr Bld  Date/Time Value Ref Range  Status  11/28/2017 08:32 AM 5.8 (H) 4.8 - 5.6 % Final    Comment:    (NOTE) Pre diabetes:          5.7%-6.4% Diabetes:              >6.4% Glycemic control for   <7.0% adults with diabetes    CBG: Recent Labs  Lab 12/03/17 1200 12/03/17 1545 12/03/17 1925 12/03/17 2340 12/04/17 0325  GLUCAP 114* 130* 121* 126* 116*   Joneen Roach, AGACNP-BC [facility] Pager [phone] or [phone]  12/04/2017 7:59 AM

## 2017-12-04 NOTE — Procedures (Signed)
Electroencephalography report.  Long-term monitoring .  day 1  Recording begins 12/03/2017 at 1523 Recording ends 12/04/2017 at 805 am  CPT 95951  Date acquisition: International 10-20 for eligible placement.  18 channels EEG with additional eyes limited ipsilateral ears and EKG.  At this 15 hours of intensive EEG monitoring with simultaneous video monitoring was performed for this patient to rule out clinical and subclinical radiographic seizures  Background activities marked by background activity slowing distributed broadly ranging between delta to reaching 5 to 6 cps theta frequencies.  Relative attenuation and slowing across left hemisphere noted throughout the recording.  However there was no clinical subclinical seizures present.  No interictal epileptiform discharges noted.  Clinical interpretation: This is day 1 of intensive EEG monitoring with simultaneous video monitoring did not record any clinical or subclinical seizures.  Background activity is abnormal for several reasons #1 background activity slowing and disorganization suggestive of mild encephalopathy of nonspecific etiologies.  #2 relative attenuation slowing across left hemisphere may be suggestive of neuronal dysfunction on a structural vascular or degenerative basis across left hemisphere.  Clinical correlation is advised.

## 2017-12-05 DIAGNOSIS — R569 Unspecified convulsions: Secondary | ICD-10-CM

## 2017-12-05 LAB — CULTURE, BLOOD (ROUTINE X 2)
Culture: NO GROWTH
Culture: NO GROWTH
Special Requests: ADEQUATE
Special Requests: ADEQUATE

## 2017-12-05 LAB — POCT I-STAT 3, ART BLOOD GAS (G3+)
Bicarbonate: 25.3 mmol/L (ref 20.0–28.0)
O2 SAT: 99 %
PO2 ART: 138 mmHg — AB (ref 83.0–108.0)
TCO2: 27 mmol/L (ref 22–32)
pCO2 arterial: 41.2 mmHg (ref 32.0–48.0)
pH, Arterial: 7.397 (ref 7.350–7.450)

## 2017-12-05 LAB — RENAL FUNCTION PANEL
Albumin: 2.1 g/dL — ABNORMAL LOW (ref 3.5–5.0)
Anion gap: 8 (ref 5–15)
BUN: 27 mg/dL — AB (ref 6–20)
CHLORIDE: 119 mmol/L — AB (ref 98–111)
CO2: 26 mmol/L (ref 22–32)
CREATININE: 0.77 mg/dL (ref 0.44–1.00)
Calcium: 8.7 mg/dL — ABNORMAL LOW (ref 8.9–10.3)
Glucose, Bld: 123 mg/dL — ABNORMAL HIGH (ref 70–99)
PHOSPHORUS: 4.1 mg/dL (ref 2.5–4.6)
POTASSIUM: 4 mmol/L (ref 3.5–5.1)
Sodium: 153 mmol/L — ABNORMAL HIGH (ref 135–145)

## 2017-12-05 LAB — CBC
HEMATOCRIT: 27.3 % — AB (ref 36.0–46.0)
Hemoglobin: 8.1 g/dL — ABNORMAL LOW (ref 12.0–15.0)
MCH: 24.8 pg — ABNORMAL LOW (ref 26.0–34.0)
MCHC: 29.7 g/dL — ABNORMAL LOW (ref 30.0–36.0)
MCV: 83.7 fL (ref 78.0–100.0)
PLATELETS: 257 10*3/uL (ref 150–400)
RBC: 3.26 MIL/uL — ABNORMAL LOW (ref 3.87–5.11)
RDW: 20.7 % — AB (ref 11.5–15.5)
WBC: 13.8 10*3/uL — AB (ref 4.0–10.5)

## 2017-12-05 LAB — SODIUM
SODIUM: 153 mmol/L — AB (ref 135–145)
SODIUM: 151 mmol/L — AB (ref 135–145)
Sodium: 149 mmol/L — ABNORMAL HIGH (ref 135–145)

## 2017-12-05 LAB — GLUCOSE, CAPILLARY
GLUCOSE-CAPILLARY: 134 mg/dL — AB (ref 70–99)
GLUCOSE-CAPILLARY: 112 mg/dL — AB (ref 70–99)
GLUCOSE-CAPILLARY: 115 mg/dL — AB (ref 70–99)
GLUCOSE-CAPILLARY: 116 mg/dL — AB (ref 70–99)
Glucose-Capillary: 116 mg/dL — ABNORMAL HIGH (ref 70–99)
Glucose-Capillary: 137 mg/dL — ABNORMAL HIGH (ref 70–99)
Glucose-Capillary: 124 mg/dL — ABNORMAL HIGH (ref 70–99)
Glucose-Capillary: 120 mg/dL — ABNORMAL HIGH (ref 70–99)

## 2017-12-05 LAB — MAGNESIUM: MAGNESIUM: 2.4 mg/dL (ref 1.7–2.4)

## 2017-12-05 MED ORDER — HYDRALAZINE HCL 25 MG PO TABS
25.0000 mg | ORAL_TABLET | Freq: Three times a day (TID) | ORAL | Status: DC
  Administered 2017-12-05 – 2017-12-07 (×6): 25 mg via ORAL
  Filled 2017-12-05 (×5): qty 1

## 2017-12-05 NOTE — Progress Notes (Signed)
NEUROHOSPITALISTS STROKE TEAM - DAILY PROGRESS NOTE   SUBJECTIVE No family is at bedside. Tried to call pt son but nobody picked up the phone. RN stated that son may come today. Pt neuro stable, no significant change. Still intubated on low dose fentanyl. No seizure activity. BP on the high end, needed labetalol PRN.   OBJECTIVE Most recent Vital Signs: Vitals:   12/05/17 0740 12/05/17 0800 12/05/17 0954 12/05/17 1012  BP:  (!) 188/96  (!) 179/94  Pulse:  76    Resp:  (!) 21    Temp:      TempSrc:      SpO2: 99% 100% 100%   Weight:      Height:       CBG (last 3)  Recent Labs    12/04/17 1112 12/04/17 1546 12/05/17 0337  GLUCAP 111* 106* 134*    Physical Exam  Temp:  [99.2 F (37.3 C)-100 F (37.8 C)] 100 F (37.8 C) (09/26 2000) Pulse Rate:  [64-87] 76 (09/27 0800) Resp:  [14-27] 21 (09/27 0800) BP: (130-188)/(78-105) 179/94 (09/27 1012) SpO2:  [97 %-100 %] 100 % (09/27 0954) FiO2 (%):  [30 %] 30 % (09/27 0954) Weight:  [75.9 kg] 75.9 kg (09/27 0500)   HEENT-status post craniotomy with wound clean dry and stitches intact  cardiovascular- S1-S2 audible, pulses palpable throughout   Lungs- low breath sounds bilaterally Abdomen-hypoactive bowel sounds  musculoskeletal-no joint tenderness, deformity or swelling Skin-warm and dry Neuro - intubated on low dose sedation, able to open eyes on voice, b/l eyelid swollen, R>L but improving, PERRL, eye left gaze preference but able to cross midline. Not blinking to visual threat consistently, not tracking. Left corneal positive, and right absent. Gag positive. Facial symmetry not able to test due to ET. On pain stimulation, LUE localized to pain and able to against gravity. LLE withdraw to pain 2+/5. RUE flaccid and RLE mild withdraw to pain. B/l babinski positive. Sensation, coordination and gait not tested.    Lipid Panel    Component Value Date/Time   CHOL 138 11/28/2017  0832   TRIG 414 (H) 11/30/2017 1658   HDL 42 11/28/2017 0832   CHOLHDL 3.3 11/28/2017 0832   VLDL 32 11/28/2017 0832   LDLCALC 64 11/28/2017 0832   HgbA1C  Lab Results  Component Value Date   HGBA1C 5.8 (H) 11/28/2017    Urine Drug Screen:      Component Value Date/Time   LABOPIA NONE DETECTED 11/27/2017 1415   COCAINSCRNUR NONE DETECTED 11/27/2017 1415   LABBENZ NONE DETECTED 11/27/2017 1415   AMPHETMU POSITIVE (A) 11/27/2017 1415   THCU NONE DETECTED 11/27/2017 1415   LABBARB NONE DETECTED 11/27/2017 1415    Alcohol Level:  No results for input(s): ETH in the last 168 hours.  Ct Angio Head W Or Wo Contrast  IMPRESSION:  1. Positive for left MCA M1 large vessel occlusion, and also severe stenosis at the left ICA terminus, superimposed on the relatively large acute left hemisphere intra-axial hemorrhage (estimated blood volume 43 mL). Vasospasm suspected in the proximal ACAs. 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  2. Negative for intracranial aneurysm, CTA spot sign, or evidence of vascular malformation in association with the acute hemorrhage. 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  3. Stable intracranial mass effect since 0938 hours today, with 16 mm of leftward midline  shift and trapping of the right lateral ventricle.    Ct Head Wo Contrast  11/28/2017 IMPRESSION:  1. Stable when compared to yesterday.  2. Left MCA territory infarct with evacuated hematoma. Midline shift measures 8 mm.  3. 2-3 mm subdural hematoma along the right cerebral convexity without interval increase.    Ct Head Wo Contrast  11/27/2017 IMPRESSION:  1. Interval LEFT frontal craniotomy for basal ganglia hematoma evacuation, small amount of residual blood products and regional edema. 7 mm residual LEFT  to RIGHT midline shift, resolved RIGHT ventricular entrapment.  2. Trace RIGHT holo hemispheric and tentorial subdural hematomas. Global edema.    Ct Head Wo Contrast 11/27/2017 IMPRESSION:  Large area of hemorrhage in the left basal ganglia and left frontal lobe, 5.8 x 3.5 cm. 15 mm of left-to-right midline shift.   Mr Brain Wo Contrast 11/28/2017 IMPRESSION:  1. Postoperative changes from previous left frontal craniotomy for evacuation of left basal ganglia hematoma. Residual blood products within the evacuation cavity with persistent surrounding vasogenic edema and regional mass effect. Associated 8-9 mm of right-to-left midline shift with mild left uncal herniation similar to previous. No hydrocephalus or ventricular trapping.  2. Underlying evolving large acute ischemic left MCA territory infarct involving the majority of the left MCA territory.  3. Trace right cerebral and tentorial subdural hematoma without mass effect.  4. Right sphenoid sinusitis.  Ct Head Wo Contrast 11/29/2017 IMPRESSION:  1. Large left MCA infarct is stable in distribution.  2. Interval left frontal craniectomy with mild herniation of left frontal lobe through the defect.  3. Resolved uncal herniation, improved patency of left lateral ventricle, and 6 mm left-to-right midline shift, previously 8 mm.  4. Partial interval dispersion of acute hemorrhage in the left basal ganglia. Subcentimeter density anterior to the prior position of blood products possibly representing retracted clot or interval hemorrhage, attention at follow-up recommended.    Ct Head Wo Contrast 12/02/2017 IMPRESSION:  1. Left MCA distribution infarction is stable in distribution. Mild increased edema of the infarct with increased herniation of the left frontal lobe via the left frontal craniectomy.  2. Stable 6 mm left-to-right midline shift and partial effacement of left lateral ventricle.  3. Stable small hematoma within the left basal  ganglia.  4. No new acute intracranial abnormality identified.      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 15 mm of left-to-right midline shift.  MRI of the brain: Post craniotomy 8-9 mm of right-to-left midline shift with mild left uncal herniation similar to previous. Underlying evolving large acute ischemic left MCA territory infarct involving the majority of the left MCA territory.  Repeat Head CT post evacuation 9/20 - Left MCA territory infarct with evacuated hematoma. Midline shift measures 8 mm.   2D Echocardiogram EF 60 to 65%  LE venous doppler negative for DVT  Hypercoagulable labs negative except high ESR and CRP  May consider TEE once improved and stable  LDL 64  HgbA1c 5.8  VTE: Heparin subq  Antithrombotic: no antithrombotics secondary to [place], started ASA 81  12/04/17.   Therapy recommendations: signed off for now, re-consult later  Disposition: Pending  Pt has 54 yo son which is the next of kin, but has not been in the hospital yet. RN was able to get hold of him yesterday and he stated that his phone was broken and now fixed. However, I tried to call him this am, nobody answered. Will need discuss with him about further planning.  Cerebral edema  CT head midline shift 58m with uncal herniation  Repeat CT head s/p hematoma evacuation - midline shift 865m S/p hemicrani 9/20  Repeat CT 9/21 - midline shift 58m45mOn keppra for seizure prevention  CT repeat 12/02/2017 stable midline shift, large left  MCA infarct, residual hematoma  3% saline d/c'ed, now on NS  Na stable at 153  Na Q6h monitoring   Respiratory failure  Intubated on low dose sedation  CCM on board  Continue on vent  Tolerating weaning trials, but mental status no enough for extubation  Likely need trach and PEG   Seizure  Episode of stiffness, tongue biting  Overnight EEG no seizure  Increased keppra from 500 to 1000m62md  No more seizure since  Seizure precautions  Hypertension emergency  SBP stable but on the high end  BP goal <160   On norvasc and lisinopril  On labetalol to 300mg18mimes daily  Will add hydralazine 25mg 77m  Anemia - pt is in her period  Hb 11.8 on admission  Hemoglobin 8.4 -> 7.7-> 8.2->7.2->6.9->PRBC->8.3->8.6->8.1  Received one unit PRBC   Continue to monitor  Iron study iron 36 and ferritin 66  Put on iron pills  PRBC transfusion if b < 7.0  Leukocytosis   WBC 21.5-16.8-> 14.4-> 13.3->13.4->13.5->13.8  Afebrile  Continue monitoring   Other Stroke Risk Factors  ETOH use   Other Active Problems  ADHD - on Adderell at home  Anxiety  Depression  Hypokalemia, supplement  Hospital day # 8  This patient is critically ill due to large left MCA infarct, uncal herniation s/p hemicrani, large hemorrhagic conversion, intubated and at significant risk of neurological worsening, death form stroke recurrence, hematoma expansion, cerebral edema and uncal herniation, seizure. This patient's care requires constant monitoring of vital signs, hemodynamics, respiratory and cardiac monitoring, review of multiple databases, neurological assessment, discussion with family, other specialists and medical decision making of high complexity. I spent 35 minutes of neurocritical care time in the care of this patient.  JindonRosalin HawkinghD Stroke Neurology 12/05/2017 11:11 AM   To contact Stroke Continuity provider, please refer to [email or web address], contact  General Neurology

## 2017-12-05 NOTE — Plan of Care (Signed)
  Problem: Education: Goal: Knowledge of disease or condition will improve Outcome: Not Progressing Note:  Finally able to talk to son, legal POA. Son states he will come in today at 11am.    Problem: Activity: Goal: Ability to tolerate increased activity will improve Outcome: Progressing Note:  Patient squeezing right hand, which is an improvement.

## 2017-12-05 NOTE — Progress Notes (Signed)
  PCCM INTERVAL PROGRESS NOTE  Long discussion with patient's son [name]. She has another child who is much younger. He understands that his mother's her neurologic injury is severe and she will likely have deficits in the best case scenario. He and his mother have never had conversations about what to do in a situation like this. In order to give her the best chance at recovery, he feels it is appropriate to proceed with tracheostomy should she not markedly improve in the coming days. Continue full code.  Joneen Roach, AGACNP-BC [facility] Pulmonary/Critical Care Pager [phone] or [phone]  12/05/2017 1:24 PM

## 2017-12-05 NOTE — Progress Notes (Signed)
NAME:  Tammie Miles, MRN:  [id number], DOB:  04/26/1975, LOS: 8 ADMISSION DATE:  11/27/2017, CONSULTATION DATE:  11/27/2017 REFERRING MD:  Dr. Conchita Paris CHIEF COMPLAINT:  Left frontal hemorrhage  Brief History   39 yoF with AMS since 9/18 evening, thought due to intoxication. Still unresponsive this am.  Patient was brought to the ED where a CT was done and showed a large left sided ICH.  Patient was taken to the OR and a mini craniectomy was done.  Patient was brought out to the ICU and PCCM was consulted for vent management. Course complicated by cerebral edema requiring 3% saline limited by hypernatremia. Also complicated by encephalopathy. Tolerating vent wean, but mental status limited extubation. Then 9/25 she developed what appeared to be seizures with gaze deviation and tongue biting. EEG done overnight with results pending and AEDs adjusted. No further seizure seen.   Significant Hospital Events   9/19 Admit/ OR- mini crani 9/20>> Decompressive craniectomy ( L)  with skull harvesting in the abdomen 9/21 hypertonic saline started 9/23 hypertonic stopped.  9/25 > seizure. EEG done with no sz activity. AEDs adjusted.  Consults: date of consult/date signed off & final recs:  9/19 NSGY 9/19 PCCM   Procedures (surgical and bedside):  9/19 Intubated 9/19 OR for crani, evac of hematoma>> no change in neuro status post op 9/20 Decompressive craniectomy ( L)  with skull harvesting in the abdomen  Significant Diagnostic Tests:  9/19 [facility]  Large area of hemorrhage in the left basal ganglia and left frontal lobe, 5.8 x 3.5 cm. 15 mm of left-to-right midline shift. 9/19 CTA head: Positive for left MCA M1 large vessel occlusion, and also severe stenosis at the left ICA terminus, superimposed on the relatively large acute left hemisphere intra-axial hemorrhage (estimated blood volume 43 mL). Vasospasm suspected in the proximal ACAs. This constellation might indicate an acute large vessel Left MCA  infarct with malignant hemorrhagic transformation. 2. Negative for intracranial aneurysm, CTA spot sign, or evidence of vascular malformation in association with the acute hemorrhage. 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 3. Stable intracranial mass effect since 0938 hours today, with 16 mm of leftward midline shift and trapping of the right lateral ventricle. 11/29/2017 CT Head Large left MCA infarct is stable in distribution. Interval left frontal craniectomy with mild herniation of left frontal lobe through the defect. Resolved uncal herniation, improved patency of left lateral ventricle, and 6 mm left-to-right midline shift, previously 8 mm. Partial interval dispersion of acute hemorrhage in the left basal ganglia. Subcentimeter density anterior to the prior position of blood products possibly representing retracted clot or interval hemorrhage, attention at follow-up recommended. CT 9/24: 1. Left MCA distribution infarction is stable in distribution. Mild increased edema of the infarct with increased herniation of the left frontal lobe via the left frontal craniectomy. 2. Stable 6 mm left-to-right midline shift and partial effacement of left lateral ventricle. 3. Stable small hematoma within the left basal ganglia. 4. No new acute intracranial abnormality identified.  Micro Data: 9/19 MRSA PCR >> Negative 9/19 Sputum Culture >> normal flora 9/22 Blood x 2 >> 9/22 UA >> Normal 9/22 >> Sputum >> few candida dubliniensis  Antimicrobials:  9/19 Cefazolin (pre-op); 9/20;   Subjective:  No further seizure seen. Remains off vent. Sedation restarted overnight.   Objective   Blood pressure (!) 188/96, pulse 76, temperature 100 F (37.8 C), temperature  source Rectal, resp. rate (!) 21, height 5\' 1"  (1.549 m), weight 75.9 kg, SpO2 100 %.    Vent Mode: PSV;CPAP FiO2 (%):  [30 %]  30 % Set Rate:  [16 bmp] 16 bmp Vt Set:  [380 mL] 380 mL PEEP:  [5 cmH20] 5 cmH20 Pressure Support:  [8 cmH20] 8 cmH20 Plateau Pressure:  [10 cmH20-15 cmH20] 10 cmH20   Intake/Output Summary (Last 24 hours) at 12/05/2017 0848 Last data filed at 12/05/2017 0800 Gross per 24 hour  Intake 1139.47 ml  Output 3125 ml  Net -1985.53 ml   Filed Weights   12/03/17 0200 12/04/17 0200 12/05/17 0500  Weight: 78.4 kg 77 kg 75.9 kg    Examination:  General:  Adult female on vent.  HEENT: Crani sitee CDI. PERRL Neuro: More lethargic this AM. Of note sedation restarted last night.  CV: RRR, no MRG PULM: Tolerating PSV SBT. Clear bilateral Extremities: warm/dry. No acute deformity. Trace/+1 edema.  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 Now status post hemicraniotomy for evacuation. Complicated by cerebral edema requiring hypertonic saline, which has since been stopped. She remains slow to improve.  P:  Neurology primary Postop per NSGY SBP goal  < 160 mmHg: Oral meds and nicardipine PRN. Holding goal well.  3% management to neuro, goal Na 155-160. Currently 153. Keppra  May need TEE if she is able to improve.  PAD protocol with fentanyl gtt, weaning. Will plan to turn off today.  Passive ROM via RN.  Hypertensive crisis, hx HTN P:  Tele monitoring. SBP goal as above. Continue norvasc 10mg  daily, Labetalol 100mg  TID, and lisinopril 20mg  BID.  Monitor renal function daily, currently normal. Daily I/O/ wts. Has diuresed well. Weaning well. Will hold today.   Acute respiratory insufficiency in the setting of massive IPH P : Continue SBT as she will tolerate mental status remains barrier to extubation Intermittent CXR VAP measures  Will likely need trach sooner than later, should family pursue this route. Difficulty contacting son, best option likely  arranging family meeting. Will attempt this.   Fever- improved Leukocytosis -stable - likely neuro driven. PCT 0.13, remains reassuring. Sputum culture with few candida.  Plan: Continue to monitor clinically off abx for now Intermittent CXR Tylenol for fever  Hypokalemia Hypo phos Hyperchloremia P : Na check daily now Trend renal panel/ mag / urinary output  Anemia - Transfused one unit PRBC 9/25 for Hgb 6.9. Only evidence of bleeding is menses.  Plan: Trend CBC Transfuse for HGB < 7 SCDs only  If menses does not improve over a period of days, may need transvaginal US/GYN evaluation.   Protein calorie malnutrition Plan: Continue TF at goal  CBG's Q 4 SSI coverage - standard SUP Pepcid  Possible ETOH abuse - unclear usage P:  Continue empiric thiamine/ folate  Hx ADHD/ depression / anxiety P:  Will address as mental status improves  Disposition / Summary of Today's Plan 12/05/17   Mental status remains barrier to extubation, she is weaning well from pulmonary standpoint. Will try to arrange family meeting.     Diet: TF  Pain/Anxiety/Delirium protocol: fentanyl gtt- weaning DVT prophylaxis: SCDs only  GI prophylaxis: pepcid  Hyperglycemia protocol: CBG q 4; SS standard Mobility: BR Code Status: full Family Communication: Son has not returned my calls.   Labs   CBC: Recent Labs  Lab 12/01/17 [phone]  12/02/17 0518 12/03/17 0012 12/03/17 0515 12/04/17 0507 12/05/17 0522  WBC 14.4* 13.3*  --  13.4* 13.5* 13.8*  HGB 8.1* 7.2* 6.9* 8.3* 8.6* 8.1*  HCT 26.2* 24.3* 23.8* 28.0* 28.8* 27.3*  MCV 79.6 80.2  --  83.3 83.7 83.7  PLT 280 246  --  227 228 257   Basic Metabolic Panel: Recent Labs  Lab 11/30/17 1658  12/01/17 0521  12/02/17 0518  12/03/17 0515  12/04/17 0507 12/04/17 1340 12/04/17 1838 12/05/17 0043 12/05/17 0522  NA  --    < > 159*   < > 156*   < > 159*   < > 153* 153* 153* 153* 153*  K  --   --  3.3*  --  3.5  --  4.0  --  4.0  --   --    --  4.0  CL  --   --  125*  --  125*  --  125*  --  122*  --   --   --  119*  CO2  --   --  23  --  25  --  25  --  26  --   --   --  26  GLUCOSE  --   --  134*  --  142*  --  126*  --  128*  --   --   --  123*  BUN  --   --  19  --  26*  --  31*  --  27*  --   --   --  27*  CREATININE  --   --  0.76  --  0.80  --  0.78  --  0.64  --   --   --  0.77  CALCIUM  --   --  7.9*  --  8.0*  --  8.4*  --  9.0  --   --   --  8.7*  MG 2.2  --  2.1  --  2.3  --  2.6*  --   --   --   --   --  2.4  PHOS 2.1*  --  2.8  --  3.6  --  4.6  --  4.3  --   --   --  4.1   < > = values in this interval not displayed.   GFR: Estimated Creatinine Clearance: 85.3 mL/min (by C-G formula based on SCr of 0.77 mg/dL). Recent Labs  Lab 11/29/17 1524  11/30/17 0521 12/01/17 0521 12/02/17 0518 12/03/17 0515 12/04/17 0507 12/05/17 0522  PROCALCITON  --   --  0.24 0.14 0.13  --   --   --   WBC  --    < > 16.8* 14.4* 13.3* 13.4* 13.5* 13.8*  LATICACIDVEN 1.9  --  1.3  --   --   --   --   --    < > = values in this interval not displayed.   Liver Function Tests: Recent Labs  Lab 11/29/17 0412 11/30/17 0521 12/01/17 0521 12/02/17 0518 12/03/17 0515 12/04/17 0507 12/05/17 0522  AST 24 30 60*  --   --   --   --   ALT 12 15 38  --   --   --   --   ALKPHOS 53 66 104  --   --   --   --   BILITOT 0.4 0.6 0.6  --   --   --   --  PROT 5.4* 6.2* 6.0*  --   --   --   --   ALBUMIN 2.4* 2.2* 2.1* 1.9* 1.9* 2.0* 2.1*   No results for input(s): LIPASE, AMYLASE in the last 168 hours. No results for input(s): AMMONIA in the last 168 hours. ABG    Component Value Date/Time   PHART 7.335 (L) 11/29/2017 0310   PCO2ART 36.6 11/29/2017 0310   PO2ART 86.0 11/29/2017 0310   HCO3 19.5 (L) 11/29/2017 0310   TCO2 21 (L) 11/29/2017 0310   ACIDBASEDEF 6.0 (H) 11/29/2017 0310   O2SAT 96.0 11/29/2017 0310    Coagulation Profile: No results for input(s): INR, PROTIME in the last 168 hours. Cardiac Enzymes: No results for  input(s): CKTOTAL, CKMB, CKMBINDEX, TROPONINI in the last 168 hours. HbA1C: Hgb A1c MFr Bld  Date/Time Value Ref Range Status  11/28/2017 08:32 AM 5.8 (H) 4.8 - 5.6 % Final    Comment:    (NOTE) Pre diabetes:          5.7%-6.4% Diabetes:              >6.4% Glycemic control for   <7.0% adults with diabetes    CBG: Recent Labs  Lab 12/04/17 0325 12/04/17 0823 12/04/17 1112 12/04/17 1546 12/05/17 0337  GLUCAP 116* 105* 111* 106* 134*   Joneen Roach, AGACNP-BC [place] [facility]monology/Critical Care Pager [phone] or [phone]  12/05/2017 8:48 AM

## 2017-12-06 ENCOUNTER — Inpatient Hospital Stay (HOSPITAL_COMMUNITY): Payer: Medicaid Other

## 2017-12-06 DIAGNOSIS — I161 Hypertensive emergency: Secondary | ICD-10-CM

## 2017-12-06 DIAGNOSIS — R4182 Altered mental status, unspecified: Secondary | ICD-10-CM

## 2017-12-06 DIAGNOSIS — Z789 Other specified health status: Secondary | ICD-10-CM

## 2017-12-06 DIAGNOSIS — I6389 Other cerebral infarction: Secondary | ICD-10-CM

## 2017-12-06 LAB — CBC
HEMATOCRIT: 25.8 % — AB (ref 36.0–46.0)
HEMOGLOBIN: 7.7 g/dL — AB (ref 12.0–15.0)
MCH: 25.1 pg — AB (ref 26.0–34.0)
MCHC: 29.8 g/dL — ABNORMAL LOW (ref 30.0–36.0)
MCV: 84 fL (ref 78.0–100.0)
PLATELETS: 250 10*3/uL (ref 150–400)
RBC: 3.07 MIL/uL — AB (ref 3.87–5.11)
RDW: 21 % — ABNORMAL HIGH (ref 11.5–15.5)
WBC: 13.7 10*3/uL — ABNORMAL HIGH (ref 4.0–10.5)

## 2017-12-06 LAB — BASIC METABOLIC PANEL
ANION GAP: 6 (ref 5–15)
BUN: 23 mg/dL — ABNORMAL HIGH (ref 6–20)
CO2: 24 mmol/L (ref 22–32)
Calcium: 8.7 mg/dL — ABNORMAL LOW (ref 8.9–10.3)
Chloride: 120 mmol/L — ABNORMAL HIGH (ref 98–111)
Creatinine, Ser: 0.6 mg/dL (ref 0.44–1.00)
GFR calc Af Amer: >60 mL/min (ref >60)
GLUCOSE: 124 mg/dL — AB (ref 70–99)
POTASSIUM: 3.6 mmol/L (ref 3.5–5.1)
Sodium: 150 mmol/L — ABNORMAL HIGH (ref 135–145)

## 2017-12-06 LAB — GLUCOSE, CAPILLARY
GLUCOSE-CAPILLARY: 111 mg/dL — AB (ref 70–99)
GLUCOSE-CAPILLARY: 101 mg/dL — AB (ref 70–99)
Glucose-Capillary: 112 mg/dL — ABNORMAL HIGH (ref 70–99)
Glucose-Capillary: 111 mg/dL — ABNORMAL HIGH (ref 70–99)
Glucose-Capillary: 113 mg/dL — ABNORMAL HIGH (ref 70–99)

## 2017-12-06 LAB — MAGNESIUM: MAGNESIUM: 2.4 mg/dL (ref 1.7–2.4)

## 2017-12-06 LAB — PHOSPHORUS: Phosphorus: 3.3 mg/dL (ref 2.5–4.6)

## 2017-12-06 MED ORDER — SODIUM CHLORIDE 23.4 % INJECTION (4 MEQ/ML) FOR IV ADMINISTRATION
120.0000 meq | Freq: Once | INTRAVENOUS | Status: AC
  Administered 2017-12-06: 120 meq via INTRAVENOUS
  Filled 2017-12-06: qty 30

## 2017-12-06 NOTE — Progress Notes (Signed)
Na 149. Give 23.4% x1. Stroke team to follow repeat Na in the AM.  -- Milon Dikes, MD [facility] Pager: [phone] If 7pm to 7am, please call on call as listed on AMION.

## 2017-12-06 NOTE — Progress Notes (Signed)
NAME:  Tammie Miles, MRN:  [id number], DOB:  March 09, 1976, LOS: 9 ADMISSION DATE:  11/27/2017, CONSULTATION DATE:  11/27/2017 REFERRING MD:  Dr. Conchita Paris CHIEF COMPLAINT:  Left frontal hemorrhage  Brief History   24 yoF with AMS since 9/18 evening, thought due to intoxication. Still unresponsive this am.  Patient was brought to the ED where a CT was done and showed a large left sided ICH.  Patient was taken to the OR and a mini craniectomy was done.  Patient was brought out to the ICU and PCCM was consulted for vent management. Course complicated by cerebral edema requiring 3% saline limited by hypernatremia. Also complicated by encephalopathy. Tolerating vent wean, but mental status limited extubation. Then 9/25 she developed what appeared to be seizures with gaze deviation and tongue biting. EEG done overnight with results pending and AEDs adjusted. No further seizure seen.   Significant Hospital Events   9/19 Admit/ OR- mini crani 9/20>> Decompressive craniectomy ( L)  with skull harvesting in the abdomen 9/21 hypertonic saline started 9/23 hypertonic stopped.  9/25 > seizure. EEG done with no sz activity. AEDs adjusted.  Consults: date of consult/date signed off & final recs:  9/19 NSGY 9/19 PCCM   Procedures (surgical and bedside):  9/19 Intubated 9/19 OR for crani, evac of hematoma>> no change in neuro status post op 9/20 Decompressive craniectomy ( L)  with skull harvesting in the abdomen  Significant Diagnostic Tests:  9/19 [facility]  Large area of hemorrhage in the left basal ganglia and left frontal lobe, 5.8 x 3.5 cm. 15 mm of left-to-right midline shift. 9/19 CTA head: Positive for left MCA M1 large vessel occlusion, and also severe stenosis at the left ICA terminus, superimposed on the relatively large acute left hemisphere intra-axial hemorrhage (estimated blood volume 43 mL). Vasospasm suspected in the proximal ACAs. This constellation might indicate an acute large vessel Left MCA  infarct with malignant hemorrhagic transformation. 2. Negative for intracranial aneurysm, CTA spot sign, or evidence of vascular malformation in association with the acute hemorrhage. 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 3. Stable intracranial mass effect since 0938 hours today, with 16 mm of leftward midline shift and trapping of the right lateral ventricle. 11/29/2017 CT Head Large left MCA infarct is stable in distribution. Interval left frontal craniectomy with mild herniation of left frontal lobe through the defect. Resolved uncal herniation, improved patency of left lateral ventricle, and 6 mm left-to-right midline shift, previously 8 mm. Partial interval dispersion of acute hemorrhage in the left basal ganglia. Subcentimeter density anterior to the prior position of blood products possibly representing retracted clot or interval hemorrhage, attention at follow-up recommended. CT 9/24: 1. Left MCA distribution infarction is stable in distribution. Mild increased edema of the infarct with increased herniation of the left frontal lobe via the left frontal craniectomy. 2. Stable 6 mm left-to-right midline shift and partial effacement of left lateral ventricle. 3. Stable small hematoma within the left basal ganglia. 4. No new acute intracranial abnormality identified.  Micro Data: 9/19 MRSA PCR >> Negative 9/19 Sputum Culture >> normal flora 9/22 Blood x 2 >> 9/22 UA >> Normal 9/22 >> Sputum >> few candida dubliniensis  Antimicrobials:  9/19 Cefazolin (pre-op); 9/20;   Subjective:  No further seizure seen. Remains off vent. Sedation restarted overnight.   Objective   Blood pressure (!) 161/92, pulse 84, temperature 98.8 F (37.1 C), temperature  source Core, resp. rate 17, height 5\' 1"  (1.549 m), weight 75.6 kg, SpO2 99 %.    Vent Mode: CPAP;PSV FiO2 (%):  [30 %] 30  % Set Rate:  [16 bmp] 16 bmp Vt Set:  [380 mL] 380 mL PEEP:  [5 cmH20] 5 cmH20 Pressure Support:  [5 cmH20] 5 cmH20 Plateau Pressure:  [10 cmH20-14 cmH20] 10 cmH20   Intake/Output Summary (Last 24 hours) at 12/06/2017 0833 Last data filed at 12/06/2017 0700 Gross per 24 hour  Intake 2157.12 ml  Output 2495 ml  Net -337.88 ml   Filed Weights   12/04/17 0200 12/05/17 0500 12/06/17 0453  Weight: 77 kg 75.9 kg 75.6 kg    Examination:  General:  Adult female on vent.  HEENT: Crani sitee CDI. PERRL Neuro: More lethargic this AM. Of note sedation restarted last night.  CV: RRR, no MRG PULM: Tolerating PSV SBT. Clear bilateral Extremities: warm/dry. No acute deformity. Trace/+1 edema.  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 Now status post hemicraniotomy for evacuation. Complicated by cerebral edema requiring hypertonic saline, which has since been stopped. She remains slow to improve.  P:  Neurology primary Postop per NSGY SBP goal  < 160 mmHg: Oral meds and nicardipine PRN. Holding goal well.  3% management to neuro, goal Na 155-160. Currently 153. Keppra  May need TEE if she is able to improve.  PAD protocol with fentanyl gtt, weaning. Will plan to turn off today.  Passive ROM via RN.  Hypertensive crisis, hx HTN P:  Tele monitoring. SBP goal as above. Continue norvasc 10mg  daily, Labetalol 100mg  TID, and lisinopril 20mg  BID.  Monitor renal function daily, currently normal. Daily I/O/ wts. Has diuresed well. Weaning well. Will hold today.   Acute respiratory insufficiency in the setting of massive IPH P : Continue SBT as she will tolerate mental status remains barrier to extubation Intermittent CXR VAP measures  Will likely need trach sooner than later, should family pursue this route. Difficulty contacting son, best option likely arranging  family meeting. Will attempt this.   Fever- improved Leukocytosis -stable - likely neuro driven. PCT 0.13, remains reassuring. Sputum culture with few candida.  Plan: Continue to monitor clinically off abx for now Intermittent CXR Tylenol for fever  Hypokalemia Hypo phos Hyperchloremia P : Na check daily now Trend renal panel/ mag / urinary output  Anemia - Transfused one unit PRBC 9/25 for Hgb 6.9. Only evidence of bleeding is menses.  Plan: Trend CBC Transfuse for HGB < 7 SCDs only  If menses does not improve over a period of days, may need transvaginal US/GYN evaluation.   Protein calorie malnutrition Plan: Continue TF at goal  CBG's Q 4 SSI coverage - standard SUP Pepcid  Possible ETOH abuse - unclear usage P:  Continue empiric thiamine/ folate  Hx ADHD/ depression / anxiety P:  Will address as mental status improves  Disposition / Summary of Today's Plan 12/06/17   Mental status remains barrier to extubation, she is weaning well from pulmonary standpoint. Will try to arrange family meeting during the week , will meet criteria for Bronch     Diet: TF  Pain/Anxiety/Delirium protocol: fentanyl gtt- weaning DVT prophylaxis: SCDs only  GI prophylaxis: pepcid  Hyperglycemia protocol: CBG q 4; SS standard Mobility: BR Code Status: full Family Communication: Son has not returned my calls.   Labs  CBC: Recent Labs  Lab 12/02/17 0518 12/03/17 0012 12/03/17 0515 12/04/17 0507 12/05/17 0522 12/06/17 0456  WBC 13.3*  --  13.4* 13.5* 13.8* 13.7*  HGB 7.2* 6.9* 8.3* 8.6* 8.1* 7.7*  HCT 24.3* 23.8* 28.0* 28.8* 27.3* 25.8*  MCV 80.2  --  83.3 83.7 83.7 84.0  PLT 246  --  227 228 257 250   Basic Metabolic Panel: Recent Labs  Lab 12/01/17 0521  12/02/17 0518  12/03/17 0515  12/04/17 0507  12/05/17 0043 12/05/17 0522 12/05/17 1813 12/05/17 2312 12/06/17 0456  NA 159*   < > 156*   < > 159*   < > 153*   < > 153* 153* 151* 149* 150*  K 3.3*  --  3.5   --  4.0  --  4.0  --   --  4.0  --   --  3.6  CL 125*  --  125*  --  125*  --  122*  --   --  119*  --   --  120*  CO2 23  --  25  --  25  --  26  --   --  26  --   --  24  GLUCOSE 134*  --  142*  --  126*  --  128*  --   --  123*  --   --  124*  BUN 19  --  26*  --  31*  --  27*  --   --  27*  --   --  23*  CREATININE 0.76  --  0.80  --  0.78  --  0.64  --   --  0.77  --   --  0.60  CALCIUM 7.9*  --  8.0*  --  8.4*  --  9.0  --   --  8.7*  --   --  8.7*  MG 2.1  --  2.3  --  2.6*  --   --   --   --  2.4  --   --  2.4  PHOS 2.8  --  3.6  --  4.6  --  4.3  --   --  4.1  --   --  3.3   < > = values in this interval not displayed.   GFR: Estimated Creatinine Clearance: 85.2 mL/min (by C-G formula based on SCr of 0.6 mg/dL). Recent Labs  Lab 11/29/17 1524  11/30/17 0521 12/01/17 0521 12/02/17 0518 12/03/17 0515 12/04/17 0507 12/05/17 0522 12/06/17 0456  PROCALCITON  --   --  0.24 0.14 0.13  --   --   --   --   WBC  --    < > 16.8* 14.4* 13.3* 13.4* 13.5* 13.8* 13.7*  LATICACIDVEN 1.9  --  1.3  --   --   --   --   --   --    < > = values in this interval not displayed.   Liver Function Tests: Recent Labs  Lab 11/30/17 0521 12/01/17 0521 12/02/17 0518 12/03/17 0515 12/04/17 0507 12/05/17 0522  AST 30 60*  --   --   --   --   ALT 15 38  --   --   --   --   ALKPHOS 66 104  --   --   --   --   BILITOT 0.6 0.6  --   --   --   --   PROT 6.2* 6.0*  --   --   --   --  ALBUMIN 2.2* 2.1* 1.9* 1.9* 2.0* 2.1*   No results for input(s): LIPASE, AMYLASE in the last 168 hours. No results for input(s): AMMONIA in the last 168 hours. ABG    Component Value Date/Time   PHART 7.397 12/05/2017 1216   PCO2ART 41.2 12/05/2017 1216   PO2ART 138.0 (H) 12/05/2017 1216   HCO3 25.3 12/05/2017 1216   TCO2 27 12/05/2017 1216   ACIDBASEDEF 6.0 (H) 11/29/2017 0310   O2SAT 99.0 12/05/2017 1216    Coagulation Profile: No results for input(s): INR, PROTIME in the last 168 hours. Cardiac  Enzymes: No results for input(s): CKTOTAL, CKMB, CKMBINDEX, TROPONINI in the last 168 hours. HbA1C: Hgb A1c MFr Bld  Date/Time Value Ref Range Status  11/28/2017 08:32 AM 5.8 (H) 4.8 - 5.6 % Final    Comment:    (NOTE) Pre diabetes:          5.7%-6.4% Diabetes:              >6.4% Glycemic control for   <7.0% adults with diabetes    CBG: Recent Labs  Lab 12/05/17 1612 12/05/17 1944 12/05/17 2323 12/06/17 0354 12/06/17 0750  GLUCAP 115* 120* 116* 111* 112*   Melodie Bouillon MD  [facility]  [phone]  12/06/2017 8:33 AM

## 2017-12-06 NOTE — Progress Notes (Signed)
NEUROHOSPITALISTS STROKE TEAM - DAILY PROGRESS NOTE   SUBJECTIVE No family is at bedside. Tried to call pt son but nobody picked up the phone. RN stated that son may come today. Pt neuro stable, no significant change. Still intubated on low dose fentanyl. No seizure activity. BP on the high end, needed labetalol PRN.   OBJECTIVE Most recent Vital Signs: Vitals:   12/06/17 0600 12/06/17 0700 12/06/17 0731 12/06/17 0800  BP: (!) 159/101 (!) 159/96 (!) 161/92 (!) 161/92  Pulse: 84 71 84 76  Resp: _0 Temp:      TempSrc:      SpO2: 100% 100% 99% 100%  Weight:      Height:       CBG (last 3)  Recent Labs    12/05/17 2323 12/06/17 0354 12/06/17 0750  GLUCAP 116* 111* 112*    Physical Exam  Temp:  [98.1 F (36.7 C)-99.6 F (37.6 C)] 98.8 F (37.1 C) (09/28 0000) Pulse Rate:  [66-95] 76 (09/28 0800) Resp:  [14-23] 16 (09/28 0800) BP: (138-183)/(84-101) 161/92 (09/28 0800) SpO2:  [97 %-100 %] 100 % (09/28 0800) FiO2 (%):  [30 %] 30 % (09/28 0731) Weight:  [75.6 kg] 75.6 kg (09/28 0453)   HEENT-status post craniotomy with wound clean dry and stitches intact  cardiovascular- S1-S2 audible, pulses palpable throughout   Lungs- low breath sounds bilaterally Abdomen-hypoactive bowel sounds  musculoskeletal-no joint tenderness, deformity or swelling Skin-warm and dry Neurological Exam :   - intubated on low dose sedation, able to open eyes on voice, b/l eyelid swollen, R>L but improving, PERRL, eye left gaze preference but able to cross midline. Not blinking to visual threat consistently, not tracking. Left corneal positive, and right absent. Gag positive. Facial symmetry not able to test due to ET. On pain stimulation, LUE localized to pain and able to against gravity. LLE withdraw to pain 2+/5. RUE flaccid and RLE mild withdraw to pain. B/l babinski positive. Sensation, coordination and gait not tested.    Lipid Panel    Component Value Date/Time   CHOL 138 11/28/2017 0832   TRIG 414 (H) 11/30/2017 1658   HDL 42 11/28/2017 0832   CHOLHDL 3.3 11/28/2017 0832   VLDL 32 11/28/2017 0832   LDLCALC 64 11/28/2017 0832   HgbA1C  Lab Results  Component Value Date   HGBA1C 5.8 (H) 11/28/2017    Urine Drug Screen:      Component Value Date/Time   LABOPIA NONE DETECTED 11/27/2017 1415   COCAINSCRNUR NONE DETECTED 11/27/2017 1415   LABBENZ NONE DETECTED 11/27/2017 1415   AMPHETMU POSITIVE (A) 11/27/2017 1415   THCU NONE DETECTED 11/27/2017 1415   LABBARB NONE DETECTED 11/27/2017 1415    Alcohol Level:  No results for input(s): ETH in the last 168 hours.  IMAGING  Ct Angio Head W Or Wo Contrast  IMPRESSION:  1. Positive for left MCA M1 large vessel occlusion, and also severe stenosis at the left ICA terminus, superimposed on the relatively large acute left hemisphere intra-axial hemorrhage (estimated blood volume 43 mL). Vasospasm suspected in the proximal ACAs. 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  2. Negative for intracranial aneurysm, CTA spot sign, or evidence of vascular malformation in association with the acute hemorrhage. 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  3. Stable intracranial mass effect since 0938 hours today,  with 16 mm of leftward midline shift and trapping of the right lateral ventricle.    Ct Head Wo Contrast  11/28/2017 IMPRESSION:  1. Stable when compared to yesterday.  2. Left MCA territory infarct with evacuated hematoma. Midline shift measures 8 mm.  3. 2-3 mm subdural hematoma along the right cerebral convexity without interval increase.    Ct Head Wo Contrast  11/27/2017 IMPRESSION:  1. Interval LEFT frontal craniotomy for basal ganglia hematoma evacuation, small amount of  residual blood products and regional edema. 7 mm residual LEFT to RIGHT midline shift, resolved RIGHT ventricular entrapment.  2. Trace RIGHT holo hemispheric and tentorial subdural hematomas. Global edema.    Ct Head Wo Contrast 11/27/2017 IMPRESSION:  Large area of hemorrhage in the left basal ganglia and left frontal lobe, 5.8 x 3.5 cm. 15 mm of left-to-right midline shift.   Mr Brain Wo Contrast 11/28/2017 IMPRESSION:  1. Postoperative changes from previous left frontal craniotomy for evacuation of left basal ganglia hematoma. Residual blood products within the evacuation cavity with persistent surrounding vasogenic edema and regional mass effect. Associated 8-9 mm of right-to-left midline shift with mild left uncal herniation similar to previous. No hydrocephalus or ventricular trapping.  2. Underlying evolving large acute ischemic left MCA territory infarct involving the majority of the left MCA territory.  3. Trace right cerebral and tentorial subdural hematoma without mass effect.  4. Right sphenoid sinusitis.  Ct Head Wo Contrast 11/29/2017 IMPRESSION:  1. Large left MCA infarct is stable in distribution.  2. Interval left frontal craniectomy with mild herniation of left frontal lobe through the defect.  3. Resolved uncal herniation, improved patency of left lateral ventricle, and 6 mm left-to-right midline shift, previously 8 mm.  4. Partial interval dispersion of acute hemorrhage in the left basal ganglia. Subcentimeter density anterior to the prior position of blood products possibly representing retracted clot or interval hemorrhage, attention at follow-up recommended.    Ct Head Wo Contrast 12/02/2017 IMPRESSION:  1. Left MCA distribution infarction is stable in distribution. Mild increased edema of the infarct with increased herniation of the left frontal lobe via the left frontal craniectomy.  2. Stable 6 mm left-to-right midline shift and partial effacement of left lateral  ventricle.  3. Stable small hematoma within the left basal ganglia.  4. No new acute intracranial abnormality identified.      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 15 mm of left-to-right midline shift.  MRI of the brain: Post craniotomy 8-9 mm of right-to-left midline shift with mild left uncal herniation similar to previous. Underlying evolving large acute ischemic left MCA territory infarct involving the majority of the left MCA territory.  Repeat Head CT post evacuation 9/20 - Left MCA territory infarct with evacuated hematoma. Midline shift measures 8 mm.   UDS - positive for amphetamines  2D Echocardiogram EF 60 to 65%  LE venous doppler negative for DVT  Hypercoagulable labs negative except high ESR and CRP  May consider TEE once improved and stable  LDL 64  HgbA1c 5.8  VTE:  Heparin subq  Antithrombotic: no antithrombotics secondary to McDonald, started ASA 81 12/04/17.   Therapy recommendations: signed off for now, re-consult later  Disposition: Pending  Pt has 35 yo son which is the next of kin, but has not been in the hospital yet. RN was able to get hold of him yesterday and he stated that his phone was broken and now fixed. However, I tried to call him this am, nobody answered. Will need discuss with him about further planning.  Cerebral edema  CT head midline shift 71m with uncal herniation  Repeat CT head s/p hematoma evacuation - midline shift 877m S/p hemicrani 9/20  Repeat CT 9/21 - midline shift  43m15mOn keppra for seizure prevention  CT repeat 12/02/2017 stable midline shift, large left MCA infarct, residual hematoma  3% saline d/c'ed, now on NS  Na stable at 150  Plan to DC hypertonic saline 12/06/17 and check na daily till it normalizes   Respiratory failure  Intubated on low dose sedation  CCM on board  Continue on vent  Tolerating weaning trials, but mental status no enough for extubation  Likely need trach and PEG   Seizure  Episode of stiffness, tongue biting  Overnight EEG no seizure  Increased keppra from 500 to 1000m27md  No more seizure since  Seizure precautions  Hypertension emergency  SBP stable but on the high end  BP goal <160   On norvasc and lisinopril  On labetalol to 300mg71mimes daily  Will add hydralazine 25mg 78m  Anemia - pt is in her period  Hb 11.8 on admission  Hemoglobin 8.4 -> 7.7-> 8.2->7.2->6.9->PRBC->8.3->8.6->8.1  Received one unit PRBC   Continue to monitor  Iron study iron 36 and ferritin 66  Put on iron pills  PRBC transfusion if b < 7.0  Leukocytosis   WBC 21.5-16.8-> 14.4-> 13.3->13.4->13.5->13.8  Afebrile  Continue monitoring   Other Stroke Risk Factors  ETOH use   Other Active Problems  ADHD - on Adderell at home  Anxiety  Depression  Hypokalemia, supplement  Seen by Dr Arora Rory Percyafter midnight - Na 149 -> 23.4% x 1 -> repeat Na 150 today. CCM on board.Plan to Dc hypertonic saline today and check Na only daily once  Hospital day # 9  This patient is critically ill due to large left MCA infarct, uncal herniation s/p hemicrani, large hemorrhagic conversion, intubated and at significant risk of neurological worsening, death form stroke recurrence, hematoma expansion, cerebral edema and uncal herniation, seizure. This patient's care requires constant monitoring of vital signs, hemodynamics, respiratory and cardiac monitoring, review of multiple databases, neurological assessment,  discussion with family, other specialists and medical decision making of high complexity. I spent 35 minutes of neurocritical care time in the care of this patient. PramodAntony Contrasedical Director [facility] Pager: [phone] 1:10 PM   To contact Stroke Continuity provider, please refer to [email or web address], contact General Neurology

## 2017-12-06 NOTE — Progress Notes (Signed)
Orthopedic Tech Progress Note Patient Details:  Tammie Miles 1975-11-21 [id number]  Ortho Devices Type of Ortho Device: Prafo boot/shoe Ortho Device/Splint Location: bilateral Ortho Device/Splint Interventions: Application   Post Interventions Patient Tolerated: Well Instructions Provided: Care of device   Nikki Dom 12/06/2017, 9:04 AM

## 2017-12-07 DIAGNOSIS — I63512 Cerebral infarction due to unspecified occlusion or stenosis of left middle cerebral artery: Secondary | ICD-10-CM

## 2017-12-07 DIAGNOSIS — Z9889 Other specified postprocedural states: Secondary | ICD-10-CM

## 2017-12-07 LAB — BLOOD GAS, ARTERIAL
ACID-BASE EXCESS: 1.7 mmol/L (ref 0.0–2.0)
Bicarbonate: 25.4 mmol/L (ref 20.0–28.0)
DRAWN BY: 50222
FIO2: 0.3
MODE: POSITIVE
O2 SAT: 99.1 %
PATIENT TEMPERATURE: 98.6
PCO2 ART: 37.2 mmHg (ref 32.0–48.0)
PEEP/CPAP: 5 cmH2O
Pressure support: 8 cmH2O
pH, Arterial: 7.448 (ref 7.350–7.450)
pO2, Arterial: 140 mmHg — ABNORMAL HIGH (ref 83.0–108.0)

## 2017-12-07 LAB — TYPE AND SCREEN
ABO/RH(D): O POS
Antibody Screen: NEGATIVE
UNIT DIVISION: 0
UNIT DIVISION: 0

## 2017-12-07 LAB — BPAM RBC
Blood Product Expiration Date: 201910202359
Blood Product Expiration Date: 201910202359
ISSUE DATE / TIME: 201909250251
UNIT TYPE AND RH: 5100
UNIT TYPE AND RH: 5100

## 2017-12-07 LAB — CBC WITH DIFFERENTIAL/PLATELET
BASOS PCT: 0 %
Basophils Absolute: 0 10*3/uL (ref 0.0–0.1)
EOS ABS: 0.3 10*3/uL (ref 0.0–0.7)
EOS PCT: 2 %
HCT: 26 % — ABNORMAL LOW (ref 36.0–46.0)
HEMOGLOBIN: 7.7 g/dL — AB (ref 12.0–15.0)
LYMPHS PCT: 22 %
Lymphs Abs: 3.1 10*3/uL (ref 0.7–4.0)
MCH: 24.8 pg — ABNORMAL LOW (ref 26.0–34.0)
MCHC: 29.6 g/dL — ABNORMAL LOW (ref 30.0–36.0)
MCV: 83.9 fL (ref 78.0–100.0)
MONO ABS: 0.9 10*3/uL (ref 0.1–1.0)
Monocytes Relative: 6 %
NEUTROS PCT: 70 %
Neutro Abs: 10 10*3/uL — ABNORMAL HIGH (ref 1.7–7.7)
PLATELETS: 240 10*3/uL (ref 150–400)
RBC: 3.1 MIL/uL — AB (ref 3.87–5.11)
RDW: 21.3 % — ABNORMAL HIGH (ref 11.5–15.5)
WBC: 14.3 10*3/uL — AB (ref 4.0–10.5)

## 2017-12-07 LAB — COMPREHENSIVE METABOLIC PANEL
ALK PHOS: 178 U/L — AB (ref 38–126)
ALT: 36 U/L (ref 0–44)
AST: 31 U/L (ref 15–41)
Albumin: 2.1 g/dL — ABNORMAL LOW (ref 3.5–5.0)
Anion gap: 8 (ref 5–15)
BUN: 23 mg/dL — AB (ref 6–20)
CALCIUM: 8.7 mg/dL — AB (ref 8.9–10.3)
CHLORIDE: 115 mmol/L — AB (ref 98–111)
CO2: 24 mmol/L (ref 22–32)
CREATININE: 0.61 mg/dL (ref 0.44–1.00)
GFR calc Af Amer: >60 mL/min (ref >60)
GFR calc non Af Amer: >60 mL/min (ref >60)
Glucose, Bld: 120 mg/dL — ABNORMAL HIGH (ref 70–99)
Potassium: 3.5 mmol/L (ref 3.5–5.1)
SODIUM: 147 mmol/L — AB (ref 135–145)
Total Bilirubin: 0.4 mg/dL (ref 0.3–1.2)
Total Protein: 6.1 g/dL — ABNORMAL LOW (ref 6.5–8.1)

## 2017-12-07 LAB — GLUCOSE, CAPILLARY
GLUCOSE-CAPILLARY: 112 mg/dL — AB (ref 70–99)
GLUCOSE-CAPILLARY: 104 mg/dL — AB (ref 70–99)
GLUCOSE-CAPILLARY: 112 mg/dL — AB (ref 70–99)
GLUCOSE-CAPILLARY: 105 mg/dL — AB (ref 70–99)
GLUCOSE-CAPILLARY: 115 mg/dL — AB (ref 70–99)
GLUCOSE-CAPILLARY: 121 mg/dL — AB (ref 70–99)
Glucose-Capillary: 104 mg/dL — ABNORMAL HIGH (ref 70–99)

## 2017-12-07 LAB — APTT: APTT: 38 s — AB (ref 24–36)

## 2017-12-07 LAB — PROTIME-INR
INR: 1.13
Prothrombin Time: 14.4 seconds (ref 11.4–15.2)

## 2017-12-07 LAB — MAGNESIUM: Magnesium: 2.2 mg/dL (ref 1.7–2.4)

## 2017-12-07 MED ORDER — NIFEDIPINE ER OSMOTIC RELEASE 90 MG PO TB24
90.0000 mg | ORAL_TABLET | Freq: Every day | ORAL | Status: DC
  Administered 2017-12-07: 90 mg via ORAL
  Filled 2017-12-07 (×2): qty 1

## 2017-12-07 MED ORDER — CLEVIDIPINE BUTYRATE 0.5 MG/ML IV EMUL
0.0000 mg/h | INTRAVENOUS | Status: DC
  Administered 2017-12-07: 1 mg/h via INTRAVENOUS
  Filled 2017-12-07 (×2): qty 50

## 2017-12-07 MED ORDER — CLONIDINE HCL 0.1 MG PO TABS
0.2000 mg | ORAL_TABLET | Freq: Four times a day (QID) | ORAL | Status: DC | PRN
  Administered 2017-12-07: 0.2 mg via ORAL
  Filled 2017-12-07: qty 2

## 2017-12-07 NOTE — Progress Notes (Signed)
NAME:  Tammie Miles, MRN:  [id number], DOB:  05-15-1975, LOS: 10 ADMISSION DATE:  11/27/2017, CONSULTATION DATE:  11/27/2017 REFERRING MD:  Dr. Conchita Paris CHIEF COMPLAINT:  Left frontal hemorrhage  Brief History   86 yoF with AMS since 9/18 evening, thought due to intoxication. Still unresponsive this am.  Patient was brought to the ED where a CT was done and showed a large left sided ICH.  Patient was taken to the OR and a mini craniectomy was done.  Patient was brought out to the ICU and PCCM was consulted for vent management. Course complicated by cerebral edema requiring 3% saline limited by hypernatremia. Also complicated by encephalopathy. Tolerating vent wean, but mental status limited extubation. Then 9/25 she developed what appeared to be seizures with gaze deviation and tongue biting. EEG done overnight with results pending and AEDs adjusted. No further seizure seen.   Significant Hospital Events   9/19 Admit/ OR- mini crani 9/20>> Decompressive craniectomy ( L)  with skull harvesting in the abdomen 9/21 hypertonic saline started 9/23 hypertonic stopped.  9/25 > seizure. EEG done with no sz activity. AEDs adjusted.  Consults: date of consult/date signed off & final recs:  9/19 NSGY 9/19 PCCM   Procedures (surgical and bedside):  9/19 Intubated 9/19 OR for crani, evac of hematoma>> no change in neuro status post op 9/20 Decompressive craniectomy ( L)  with skull harvesting in the abdomen  Significant Diagnostic Tests:  9/19 [facility]  Large area of hemorrhage in the left basal ganglia and left frontal lobe, 5.8 x 3.5 cm. 15 mm of left-to-right midline shift. 9/19 CTA head: Positive for left MCA M1 large vessel occlusion, and also severe stenosis at the left ICA terminus, superimposed on the relatively large acute left hemisphere intra-axial hemorrhage (estimated blood volume 43 mL). Vasospasm suspected in the proximal ACAs. This constellation might indicate an acute large vessel Left MCA  infarct with malignant hemorrhagic transformation. 2. Negative for intracranial aneurysm, CTA spot sign, or evidence of vascular malformation in association with the acute hemorrhage. 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 3. Stable intracranial mass effect since 0938 hours today, with 16 mm of leftward midline shift and trapping of the right lateral ventricle. 11/29/2017 CT Head Large left MCA infarct is stable in distribution. Interval left frontal craniectomy with mild herniation of left frontal lobe through the defect. Resolved uncal herniation, improved patency of left lateral ventricle, and 6 mm left-to-right midline shift, previously 8 mm. Partial interval dispersion of acute hemorrhage in the left basal ganglia. Subcentimeter density anterior to the prior position of blood products possibly representing retracted clot or interval hemorrhage, attention at follow-up recommended. CT 9/24: 1. Left MCA distribution infarction is stable in distribution. Mild increased edema of the infarct with increased herniation of the left frontal lobe via the left frontal craniectomy. 2. Stable 6 mm left-to-right midline shift and partial effacement of left lateral ventricle. 3. Stable small hematoma within the left basal ganglia. 4. No new acute intracranial abnormality identified.  Micro Data: 9/19 MRSA PCR >> Negative 9/19 Sputum Culture >> normal flora 9/22 Blood x 2 >> 9/22 UA >> Normal 9/22 >> Sputum >> few candida dubliniensis  Antimicrobials:  9/19 Cefazolin (pre-op); 9/20;   Subjective:  No further seizure seen. Remains off vent. Sedation restarted overnight.   Objective   Blood pressure (!) 169/105, pulse 70, temperature 99.1 F (37.3 C), temperature  source Esophageal, resp. rate 15, height 5\' 1"  (1.549 m), weight 79 kg, SpO2 100 %.    Vent Mode: PSV FiO2 (%):  [30 %] 30  % PEEP:  [5 cmH20] 5 cmH20 Pressure Support:  [8 cmH20] 8 cmH20 Plateau Pressure:  [10 cmH20-11 cmH20] 10 cmH20   Intake/Output Summary (Last 24 hours) at 12/07/2017 0812 Last data filed at 12/07/2017 0600 Gross per 24 hour  Intake 2001.61 ml  Output 1540 ml  Net 461.61 ml   Filed Weights   12/05/17 0500 12/06/17 0453 12/07/17 0500  Weight: 75.9 kg 75.6 kg 79 kg    Examination:  General:  Adult female on vent.  HEENT: Crani sitee CDI. PERRL Neuro: More lethargic this AM. Of note sedation restarted last night.  CV: RRR, no MRG PULM: Tolerating PSV SBT. Clear bilateral Extremities: warm/dry. No acute deformity. Trace/+1 edema.  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 Now status post hemicraniotomy for evacuation. Complicated by cerebral edema requiring hypertonic saline, which has since been stopped. She remains slow to improve.  P:  Neurology primary Postop per NSGY SBP goal  < 160 mmHg: Oral meds and nicardipine PRN. Holding goal well.  3% management to neuro, goal Na 155-160. Currently 153. Keppra  May need TEE if she is able to improve.  PAD protocol with fentanyl gtt, weaning. Will plan to turn off today.  Passive ROM via RN.  Hypertensive crisis, hx HTN P:  Tele monitoring. SBP goal as above. Continue norvasc 10mg  daily, Labetalol 100mg  TID, and lisinopril 20mg  BID.  Monitor renal function daily, currently normal. Daily I/O/ wts. Has diuresed well. Weaning well. Will hold today.  I will go ahead and DC Norvasc and hydralazine put Adalat 90 mg CAP p.o. and clonidine as needed Adalat could be increased to 120.  Acute respiratory insufficiency in the setting of massive IPH P : Continue SBT as she will tolerate mental status remains barrier to extubation Intermittent CXR VAP measures  Will likely need trach sooner than later, should family  pursue this route. Difficulty contacting son, best option likely arranging family meeting. Will attempt this.   Fever- improved Leukocytosis -stable - likely neuro driven. PCT 0.13, remains reassuring. Sputum culture with few candida.  Plan: Continue to monitor clinically off abx for now Intermittent CXR Tylenol for fever  Hypokalemia Hypo phos Hyperchloremia P : Na check daily now Trend renal panel/ mag / urinary output  Anemia - Transfused one unit PRBC 9/25 for Hgb 6.9. Only evidence of bleeding is menses.  Plan: Trend CBC Transfuse for HGB < 7 SCDs only  If menses does not improve over a period of days, may need transvaginal US/GYN evaluation.   Protein calorie malnutrition Plan: Continue TF at goal  CBG's Q 4 SSI coverage - standard SUP Pepcid  Possible ETOH abuse - unclear usage P:  Continue empiric thiamine/ folate  Hx ADHD/ depression / anxiety P:  Will address as mental status improves  Disposition / Summary of Today's Plan 12/07/17   Mental status remains barrier to extubation, she is weaning well from pulmonary standpoint. Will try to arrange family meeting during the week , will meet criteria for Bronch patient is scheduled for feeding tube and PEG at some point next week this is to be arranged with the new team on Monday.    Diet: TF  Pain/Anxiety/Delirium protocol: fentanyl gtt-  weaning DVT prophylaxis: SCDs only  GI prophylaxis: pepcid  Hyperglycemia protocol: CBG q 4; SS standard Mobility: BR Code Status: full Family Communication: Son has not returned my calls.   Labs   CBC: Recent Labs  Lab 12/03/17 0515 12/04/17 0507 12/05/17 0522 12/06/17 0456 12/07/17 0322  WBC 13.4* 13.5* 13.8* 13.7* 14.3*  NEUTROABS  --   --   --   --  10.0*  HGB 8.3* 8.6* 8.1* 7.7* 7.7*  HCT 28.0* 28.8* 27.3* 25.8* 26.0*  MCV 83.3 83.7 83.7 84.0 83.9  PLT 227 228 257 250 240   Basic Metabolic Panel: Recent Labs  Lab 12/02/17 0518  12/03/17 0515   12/04/17 0507  12/05/17 0522 12/05/17 1813 12/05/17 2312 12/06/17 0456 12/07/17 0322  NA 156*   < > 159*   < > 153*   < > 153* 151* 149* 150* 147*  K 3.5  --  4.0  --  4.0  --  4.0  --   --  3.6 3.5  CL 125*  --  125*  --  122*  --  119*  --   --  120* 115*  CO2 25  --  25  --  26  --  26  --   --  24 24  GLUCOSE 142*  --  126*  --  128*  --  123*  --   --  124* 120*  BUN 26*  --  31*  --  27*  --  27*  --   --  23* 23*  CREATININE 0.80  --  0.78  --  0.64  --  0.77  --   --  0.60 0.61  CALCIUM 8.0*  --  8.4*  --  9.0  --  8.7*  --   --  8.7* 8.7*  MG 2.3  --  2.6*  --   --   --  2.4  --   --  2.4 2.2  PHOS 3.6  --  4.6  --  4.3  --  4.1  --   --  3.3  --    < > = values in this interval not displayed.   GFR: Estimated Creatinine Clearance: 87.2 mL/min (by C-G formula based on SCr of 0.61 mg/dL). Recent Labs  Lab 12/01/17 0521 12/02/17 0518  12/04/17 0507 12/05/17 0522 12/06/17 0456 12/07/17 0322  PROCALCITON 0.14 0.13  --   --   --   --   --   WBC 14.4* 13.3*   < > 13.5* 13.8* 13.7* 14.3*   < > = values in this interval not displayed.   Liver Function Tests: Recent Labs  Lab 12/01/17 0521 12/02/17 0518 12/03/17 0515 12/04/17 0507 12/05/17 0522 12/07/17 0322  AST 60*  --   --   --   --  31  ALT 38  --   --   --   --  36  ALKPHOS 104  --   --   --   --  178*  BILITOT 0.6  --   --   --   --  0.4  PROT 6.0*  --   --   --   --  6.1*  ALBUMIN 2.1* 1.9* 1.9* 2.0* 2.1* 2.1*   No results for input(s): LIPASE, AMYLASE in the last 168 hours. No results for input(s): AMMONIA in the last 168 hours. ABG    Component Value Date/Time   PHART 7.448 12/07/2017 0611   PCO2ART 37.2 12/07/2017 1610  PO2ART 140 (H) 12/07/2017 0611   HCO3 25.4 12/07/2017 0611   TCO2 27 12/05/2017 1216   ACIDBASEDEF 6.0 (H) 11/29/2017 0310   O2SAT 99.1 12/07/2017 0611    Coagulation Profile: Recent Labs  Lab 12/07/17 0322  INR 1.13   Cardiac Enzymes: No results for input(s): CKTOTAL,  CKMB, CKMBINDEX, TROPONINI in the last 168 hours. HbA1C: Hgb A1c MFr Bld  Date/Time Value Ref Range Status  11/28/2017 08:32 AM 5.8 (H) 4.8 - 5.6 % Final    Comment:    (NOTE) Pre diabetes:          5.7%-6.4% Diabetes:              >6.4% Glycemic control for   <7.0% adults with diabetes    CBG: Recent Labs  Lab 12/06/17 1508 12/06/17 1938 12/07/17 0009 12/07/17 0334 12/07/17 0729  GLUCAP 113* 101* 104* 112* 104*    Critical care time spent in the care of this patient is 45 minutes.  Melodie Bouillon MD  [facility]  [phone]  12/07/2017 8:12 AM

## 2017-12-07 NOTE — Plan of Care (Signed)
  Problem: Education: Goal: Knowledge of disease or condition will improve Outcome: Not Progressing Note:  UTA - patient does not follow commands and is intubated.   Problem: Clinical Measurements: Goal: Neurologic status will improve Outcome: Progressing Note:  Patient is now guarding with the left eye and has more prominent  movement with the RUE. Cognition is still unable to be assessed due to the patient not following commands.    Problem: Activity: Goal: Ability to tolerate increased activity will improve Outcome: Progressing   Problem: Respiratory: Goal: Ability to maintain a clear airway and adequate ventilation will improve Outcome: Progressing Note:  Patient has tolerated wean mode on the vent for over 24hrs now.

## 2017-12-07 NOTE — Progress Notes (Signed)
eLink Physician-Brief Progress Note Patient Name: Tammie Miles DOB: Sep 05, 1975 MRN: [id number]   Date of Service  12/07/2017  HPI/Events of Note  RT Questioning whether pt needs to back on vent support tonight as per vent orders  eICU Interventions  rec follow the orders as written, clarify 9/30 long term vent orders     Intervention Category Major Interventions: Respiratory failure - evaluation and management  Sandrea Hughs 12/07/2017, 8:52 PM

## 2017-12-07 NOTE — Progress Notes (Signed)
NEUROHOSPITALISTS STROKE TEAM - DAILY PROGRESS NOTE   SUBJECTIVE No family is at bedside.  RN stated that son may come today. Pt neuro stable, no significant change. Still intubated on  fentanyl. No seizure activity.  OBJECTIVE Most recent Vital Signs: Vitals:   12/07/17 0400 12/07/17 0500 12/07/17 0600 12/07/17 0819  BP: (!) 158/95 (!) 160/97 (!) 169/105   Pulse: 71 75 70 72  Resp: 16 16 15 16   Temp: 99.1 F (37.3 C)     TempSrc: Esophageal     SpO2: 100% 100% 100% 100%  Weight:  79 kg    Height:       CBG (last 3)  Recent Labs    12/07/17 0009 12/07/17 0334 12/07/17 0729  GLUCAP 104* 112* 104*    Physical Exam  Temp:  [98.8 F (37.1 C)-100.4 F (38 C)] 99.1 F (37.3 C) (09/29 0400) Pulse Rate:  [67-88] 72 (09/29 0819) Resp:  [13-23] 16 (09/29 0819) BP: (134-178)/(81-107) 169/105 (09/29 0600) SpO2:  [99 %-100 %] 100 % (09/29 0819) FiO2 (%):  [30 %] 30 % (09/29 0819) Weight:  [79 kg] 79 kg (09/29 0500)   HEENT-status post craniotomy with wound clean dry and stitches intact  cardiovascular- S1-S2 audible, pulses palpable throughout   Lungs- low breath sounds bilaterally Abdomen-hypoactive bowel sounds  musculoskeletal-no joint tenderness, deformity or swelling Skin-warm and dry Neurological Exam :   - intubated on low dose sedation, able to open eyes on voice, b/l eyelid swollen, R>L but improving, PERRL, eye left gaze preference but able to cross midline. Not blinking to visual threat consistently, not tracking. Left corneal positive, and right absent. Gag positive. Facial symmetry not able to test due to ET. On pain stimulation, LUE localized to pain and able to against gravity. LLE withdraw to pain 2+/5. RUE flaccid and RLE mild withdraw to pain. B/l babinski positive. Sensation, coordination and gait not tested.    Lipid Panel    Component Value Date/Time   CHOL 138 11/28/2017 0832   TRIG 414 (H) 11/30/2017  1658   HDL 42 11/28/2017 0832   CHOLHDL 3.3 11/28/2017 0832   VLDL 32 11/28/2017 0832   LDLCALC 64 11/28/2017 0832   HgbA1C  Lab Results  Component Value Date   HGBA1C 5.8 (H) 11/28/2017    Urine Drug Screen:      Component Value Date/Time   LABOPIA NONE DETECTED 11/27/2017 1415   COCAINSCRNUR NONE DETECTED 11/27/2017 1415   LABBENZ NONE DETECTED 11/27/2017 1415   AMPHETMU POSITIVE (A) 11/27/2017 1415   THCU NONE DETECTED 11/27/2017 1415   LABBARB NONE DETECTED 11/27/2017 1415    Alcohol Level:  No results for input(s): ETH in the last 168 hours.  IMAGING  Ct Angio Head W Or Wo Contrast  IMPRESSION:  1. Positive for left MCA M1 large vessel occlusion, and also severe stenosis at the left ICA terminus, superimposed on the relatively large acute left hemisphere intra-axial hemorrhage (estimated blood volume 43 mL). Vasospasm suspected in the proximal ACAs. 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  2. Negative for intracranial aneurysm, CTA spot sign, or evidence of vascular malformation in association with the acute hemorrhage. 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  3. Stable intracranial mass effect since 0938 hours today, with 16 mm of leftward midline shift and trapping of the right lateral ventricle.  Ct Head Wo Contrast  11/28/2017 IMPRESSION:  1. Stable when compared to yesterday.  2. Left MCA territory infarct with evacuated hematoma. Midline shift measures 8 mm.  3. 2-3 mm subdural hematoma along the right cerebral convexity without interval increase.    Ct Head Wo Contrast  11/27/2017 IMPRESSION:  1. Interval LEFT frontal craniotomy for basal ganglia hematoma evacuation, small amount of residual blood products and regional edema. 7 mm residual LEFT to RIGHT midline  shift, resolved RIGHT ventricular entrapment.  2. Trace RIGHT holo hemispheric and tentorial subdural hematomas. Global edema.    Ct Head Wo Contrast 11/27/2017 IMPRESSION:  Large area of hemorrhage in the left basal ganglia and left frontal lobe, 5.8 x 3.5 cm. 15 mm of left-to-right midline shift.   Mr Brain Wo Contrast 11/28/2017 IMPRESSION:  1. Postoperative changes from previous left frontal craniotomy for evacuation of left basal ganglia hematoma. Residual blood products within the evacuation cavity with persistent surrounding vasogenic edema and regional mass effect. Associated 8-9 mm of right-to-left midline shift with mild left uncal herniation similar to previous. No hydrocephalus or ventricular trapping.  2. Underlying evolving large acute ischemic left MCA territory infarct involving the majority of the left MCA territory.  3. Trace right cerebral and tentorial subdural hematoma without mass effect.  4. Right sphenoid sinusitis.  Ct Head Wo Contrast 11/29/2017 IMPRESSION:  1. Large left MCA infarct is stable in distribution.  2. Interval left frontal craniectomy with mild herniation of left frontal lobe through the defect.  3. Resolved uncal herniation, improved patency of left lateral ventricle, and 6 mm left-to-right midline shift, previously 8 mm.  4. Partial interval dispersion of acute hemorrhage in the left basal ganglia. Subcentimeter density anterior to the prior position of blood products possibly representing retracted clot or interval hemorrhage, attention at follow-up recommended.    Ct Head Wo Contrast 12/02/2017 IMPRESSION:  1. Left MCA distribution infarction is stable in distribution. Mild increased edema of the infarct with increased herniation of the left frontal lobe via the left frontal craniectomy.  2. Stable 6 mm left-to-right midline shift and partial effacement of left lateral ventricle.  3. Stable small hematoma within the left basal ganglia.  4. No new  acute intracranial abnormality identified.      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 15 mm of left-to-right midline shift.  MRI of the brain: Post craniotomy 8-9 mm of right-to-left midline shift with mild left uncal herniation similar to previous. Underlying evolving large acute ischemic left MCA territory infarct involving the majority of the left MCA territory.  Repeat Head CT post evacuation 9/20 - Left MCA territory infarct with evacuated hematoma. Midline shift measures 8 mm.   UDS - positive for amphetamines  2D Echocardiogram EF 60 to 65%  LE venous doppler negative for DVT  Hypercoagulable labs negative except high ESR and CRP  May consider TEE once improved and stable  LDL 64  HgbA1c 5.8  VTE: Heparin subq  Antithrombotic: no antithrombotics secondary to [place], started ASA 81 12/04/17.   Therapy recommendations:  signed off for now, re-consult later  Disposition: Pending  Pt has 14 yo son which is the next of kin, but has not been in the hospital yet. RN was able to get hold of him yesterday and he stated that his phone was broken and now fixed. However, I tried to call him this am, nobody answered. Will need discuss with him about further planning.  Cerebral edema  CT head midline shift 84m with uncal herniation  Repeat CT head s/p hematoma evacuation - midline shift 8664m S/p hemicrani 9/20  Repeat CT 9/21 - midline shift 64m83mOn keppra for seizure prevention  CT repeat 12/02/2017 stable midline  shift, large left MCA infarct, residual hematoma  3% saline d/c'ed, now on NS  Na stable at 150  Plan to DC hypertonic saline 12/06/17 and check na daily till it normalizes   Respiratory failure  Intubated on low dose sedation  CCM on board  Continue on vent  Tolerating weaning trials, but mental status no enough for extubation  Likely need trach and PEG   Seizure  Episode of stiffness, tongue biting  Overnight EEG no seizure  Increased keppra from 500 to 1000m69md  No more seizure since  Seizure precautions  Hypertension emergency  SBP stable but on the high end  BP goal <160   On norvasc and lisinopril  On labetalol to 300mg57mimes daily  Clonidine 0.2 mg Q 6 hrs prn SBP > 160 starts today per CCM   Anemia - pt is in her period  Hb 11.8 on admission  Hemoglobin 8.4 -> 7.7-> 8.2->7.2->6.9->PRBC->8.3->8.6->8.1  Received one unit PRBC   Continue to monitor  Iron study iron 36 and ferritin 66  Put on iron pills  PRBC transfusion if b < 7.0  Leukocytosis   WBC 21.5-16.8-> 14.4-> 13.3->13.4->13.5->13.8  Afebrile  Continue monitoring   Other Stroke Risk Factors  ETOH use   Other Active Problems  ADHD - on Adderell at home  Anxiety  Depression  Hypokalemia, supplement  Seen by Dr AroraRory Percy after midnight - Na 149 -> 23.4% x 1 -> repeat Na 150 yesterday. CCM on board. Hypertonic saline dc'd. Check Na only once daily.  Multiple labs pending today per CCM.    Hospital day # 10 Plan tracheostomy and PEG tube next week. No family available for discussion of the bedside This patient is critically ill due to large left MCA infarct, uncal herniation s/p hemicrani, large hemorrhagic conversion, intubated and at significant risk of neurological worsening, death form stroke recurrence, hematoma expansion, cerebral edema and uncal herniation, seizure. This patient's care requires constant monitoring of vital signs, hemodynamics, respiratory  and cardiac monitoring, review of multiple databases, neurological assessment, discussion with family, other specialists and medical decision making of high complexity. I spent 30 minutes of neurocritical care time in the care of this patient. PramoAntony ContrasMedical Director [facility] Pager: [phone]/[phone] 11:54 AM  To contact Stroke Continuity provider, please refer to [email or web address], contact General Neurology

## 2017-12-08 ENCOUNTER — Inpatient Hospital Stay (HOSPITAL_COMMUNITY): Payer: Medicaid Other

## 2017-12-08 LAB — BLOOD GAS, ARTERIAL
Acid-Base Excess: 1.1 mmol/L (ref 0.0–2.0)
Bicarbonate: 24.6 mmol/L (ref 20.0–28.0)
DRAWN BY: 313941
FIO2: 30
O2 Saturation: 98.2 %
PATIENT TEMPERATURE: 99.7
PO2 ART: 109 mmHg — AB (ref 83.0–108.0)
Pressure control: 15 cmH2O
RATE: 16 resp/min
pCO2 arterial: 36.7 mmHg (ref 32.0–48.0)
pH, Arterial: 7.444 (ref 7.350–7.450)

## 2017-12-08 LAB — CBC WITH DIFFERENTIAL/PLATELET
BASOS ABS: 0 10*3/uL (ref 0.0–0.1)
Basophils Relative: 0 %
EOS ABS: 0.3 10*3/uL (ref 0.0–0.7)
Eosinophils Relative: 2 %
HCT: 27.3 % — ABNORMAL LOW (ref 36.0–46.0)
HEMOGLOBIN: 8.2 g/dL — AB (ref 12.0–15.0)
LYMPHS PCT: 21 %
Lymphs Abs: 2.9 10*3/uL (ref 0.7–4.0)
MCH: 25.1 pg — ABNORMAL LOW (ref 26.0–34.0)
MCHC: 30 g/dL (ref 30.0–36.0)
MCV: 83.5 fL (ref 78.0–100.0)
MONO ABS: 0.7 10*3/uL (ref 0.1–1.0)
Monocytes Relative: 5 %
NEUTROS PCT: 72 %
Neutro Abs: 10 10*3/uL — ABNORMAL HIGH (ref 1.7–7.7)
Platelets: 280 10*3/uL (ref 150–400)
RBC: 3.27 MIL/uL — ABNORMAL LOW (ref 3.87–5.11)
RDW: 21.2 % — ABNORMAL HIGH (ref 11.5–15.5)
WBC: 13.9 10*3/uL — AB (ref 4.0–10.5)

## 2017-12-08 LAB — PROTIME-INR
INR: 1.09
PROTHROMBIN TIME: 14 s (ref 11.4–15.2)

## 2017-12-08 LAB — COMPREHENSIVE METABOLIC PANEL
ALK PHOS: 160 U/L — AB (ref 38–126)
ALT: 32 U/L (ref 0–44)
AST: 29 U/L (ref 15–41)
Albumin: 2.1 g/dL — ABNORMAL LOW (ref 3.5–5.0)
Anion gap: 8 (ref 5–15)
BILIRUBIN TOTAL: 0.5 mg/dL (ref 0.3–1.2)
BUN: 19 mg/dL (ref 6–20)
CALCIUM: 8.5 mg/dL — AB (ref 8.9–10.3)
CO2: 23 mmol/L (ref 22–32)
CREATININE: 0.52 mg/dL (ref 0.44–1.00)
Chloride: 112 mmol/L — ABNORMAL HIGH (ref 98–111)
GFR calc Af Amer: >60 mL/min (ref >60)
Glucose, Bld: 127 mg/dL — ABNORMAL HIGH (ref 70–99)
Potassium: 3.1 mmol/L — ABNORMAL LOW (ref 3.5–5.1)
Sodium: 143 mmol/L (ref 135–145)
TOTAL PROTEIN: 6.4 g/dL — AB (ref 6.5–8.1)

## 2017-12-08 LAB — GLUCOSE, CAPILLARY
GLUCOSE-CAPILLARY: 103 mg/dL — AB (ref 70–99)
GLUCOSE-CAPILLARY: 102 mg/dL — AB (ref 70–99)
GLUCOSE-CAPILLARY: 100 mg/dL — AB (ref 70–99)
Glucose-Capillary: 119 mg/dL — ABNORMAL HIGH (ref 70–99)
Glucose-Capillary: 96 mg/dL (ref 70–99)
Glucose-Capillary: 90 mg/dL (ref 70–99)

## 2017-12-08 LAB — MAGNESIUM: Magnesium: 2 mg/dL (ref 1.7–2.4)

## 2017-12-08 LAB — APTT: APTT: 35 s (ref 24–36)

## 2017-12-08 MED ORDER — CLONIDINE HCL 0.1 MG PO TABS
0.1000 mg | ORAL_TABLET | Freq: Every day | ORAL | Status: DC
  Administered 2017-12-08 – 2017-12-09 (×2): 0.1 mg via ORAL
  Filled 2017-12-08 (×2): qty 1

## 2017-12-08 MED ORDER — AMLODIPINE BESYLATE 10 MG PO TABS
10.0000 mg | ORAL_TABLET | Freq: Every day | ORAL | Status: DC
  Administered 2017-12-08 – 2018-01-08 (×31): 10 mg via ORAL
  Filled 2017-12-08 (×7): qty 1
  Filled 2017-12-08: qty 2
  Filled 2017-12-08 (×23): qty 1

## 2017-12-08 MED ORDER — FENTANYL CITRATE (PF) 100 MCG/2ML IJ SOLN
200.0000 ug | Freq: Once | INTRAMUSCULAR | Status: AC
  Administered 2017-12-09: 200 ug via INTRAVENOUS
  Filled 2017-12-08: qty 4

## 2017-12-08 MED ORDER — HYDROCHLOROTHIAZIDE 25 MG PO TABS
25.0000 mg | ORAL_TABLET | Freq: Every day | ORAL | Status: DC
  Administered 2017-12-08 – 2017-12-16 (×9): 25 mg via ORAL
  Filled 2017-12-08 (×9): qty 1

## 2017-12-08 MED ORDER — MIDAZOLAM HCL 2 MG/2ML IJ SOLN
2.0000 mg | INTRAMUSCULAR | Status: DC | PRN
  Administered 2017-12-09: 2 mg via INTRAVENOUS

## 2017-12-08 MED ORDER — POTASSIUM CHLORIDE 20 MEQ/15ML (10%) PO SOLN
40.0000 meq | Freq: Once | ORAL | Status: AC
  Administered 2017-12-08: 40 meq
  Filled 2017-12-08: qty 30

## 2017-12-08 MED ORDER — HYDROCHLOROTHIAZIDE 10 MG/ML ORAL SUSPENSION: 25.0000 mg | Freq: Every day | ORAL | Status: DC

## 2017-12-08 MED ORDER — VECURONIUM BROMIDE 10 MG IV SOLR
10.0000 mg | Freq: Once | INTRAVENOUS | Status: AC
  Administered 2017-12-09: 10 mg via INTRAVENOUS
  Filled 2017-12-08: qty 10

## 2017-12-08 MED ORDER — PROPOFOL 500 MG/50ML IV EMUL
5.0000 ug/kg/min | INTRAVENOUS | Status: DC
  Administered 2017-12-09: 20 ug/kg/min via INTRAVENOUS
  Filled 2017-12-08: qty 50

## 2017-12-08 MED ORDER — MIDAZOLAM HCL 2 MG/2ML IJ SOLN
4.0000 mg | Freq: Once | INTRAMUSCULAR | Status: AC
  Administered 2017-12-09: 4 mg via INTRAVENOUS
  Filled 2017-12-08: qty 4

## 2017-12-08 MED ORDER — POTASSIUM CHLORIDE 20 MEQ PO PACK
40.0000 meq | PACK | Freq: Once | ORAL | Status: AC
  Administered 2017-12-08: 40 meq via ORAL
  Filled 2017-12-08: qty 2

## 2017-12-08 MED ORDER — ETOMIDATE 2 MG/ML IV SOLN
40.0000 mg | Freq: Once | INTRAVENOUS | Status: AC
  Administered 2017-12-09: 40 mg via INTRAVENOUS
  Filled 2017-12-08: qty 20

## 2017-12-08 MED ORDER — FENTANYL CITRATE (PF) 100 MCG/2ML IJ SOLN
100.0000 ug | INTRAMUSCULAR | Status: DC | PRN
  Administered 2017-12-08 – 2017-12-10 (×4): 100 ug via INTRAVENOUS
  Filled 2017-12-08 (×5): qty 2

## 2017-12-08 NOTE — Progress Notes (Signed)
NEUROHOSPITALISTS STROKE TEAM - DAILY PROGRESS NOTE   SUBJECTIVE No family is at bedside.  RN stated that son spoke to CCM M.D. And wants trach and PEG. Pt neuro stable, no significant change. Still intubated on  fentanyl. No seizure activity.    OBJECTIVE Most recent Vital Signs: Vitals:   12/08/17 0900 12/08/17 1000 12/08/17 1100 12/08/17 1106  BP: (!) 146/86 (!) 141/87  (!) 155/89  Pulse: 73 72 78 77  Resp: 14 16 16 16   Temp:      TempSrc:      SpO2: 100% 100% 100% 100%  Weight:      Height:       CBG (last 3)  Recent Labs    12/08/17 0322 12/08/17 0731 12/08/17 1131  GLUCAP 103* 102* 119*    Physical Exam  Temp:  [99.1 F (37.3 C)-100.5 F (38.1 C)] 99.5 F (37.5 C) (09/30 0800) Pulse Rate:  [63-90] 77 (09/30 1106) Resp:  [13-27] 16 (09/30 1106) BP: (128-179)/(71-122) 155/89 (09/30 1106) SpO2:  [99 %-100 %] 100 % (09/30 1106) FiO2 (%):  [30 %] 30 % (09/30 1106) Weight:  [77.6 kg] 77.6 kg (09/30 0500)   HEENT-status post craniotomy with wound clean dry and stitches intact  cardiovascular- S1-S2 audible, pulses palpable throughout   Lungs- low breath sounds bilaterally Abdomen-hypoactive bowel sounds  musculoskeletal-no joint tenderness, deformity or swelling Skin-warm and dry Neurological Exam :   - intubated on low dose sedation, able to open eyes on voice, b/l eyelid swollen, R>L but improving, PERRL, eye left gaze preference but able to cross midline. Not blinking to visual threat consistently, not tracking. Left corneal positive, and right absent. Gag positive. Facial symmetry not able to test due to ET. On pain stimulation, LUE localized to pain and able to against gravity. LLE withdraw to pain 2+/5. RUE flaccid and RLE mild withdraw to pain. B/l babinski positive. Sensation, coordination and gait not tested.    Lipid Panel    Component Value Date/Time   CHOL 138 11/28/2017 0832   TRIG 414 (H)  11/30/2017 1658   HDL 42 11/28/2017 0832   CHOLHDL 3.3 11/28/2017 0832   VLDL 32 11/28/2017 0832   LDLCALC 64 11/28/2017 0832   HgbA1C  Lab Results  Component Value Date   HGBA1C 5.8 (H) 11/28/2017    Urine Drug Screen:      Component Value Date/Time   LABOPIA NONE DETECTED 11/27/2017 1415   COCAINSCRNUR NONE DETECTED 11/27/2017 1415   LABBENZ NONE DETECTED 11/27/2017 1415   AMPHETMU POSITIVE (A) 11/27/2017 1415   THCU NONE DETECTED 11/27/2017 1415   LABBARB NONE DETECTED 11/27/2017 1415    Alcohol Level:  No results for input(s): ETH in the last 168 hours.    IMAGING  Ct Angio Head W Or Wo Contrast  IMPRESSION:  1. Positive for left MCA M1 large vessel occlusion, and also severe stenosis at the left ICA terminus, superimposed on the relatively large acute left hemisphere intra-axial hemorrhage (estimated blood volume 43 mL). Vasospasm suspected in the proximal ACAs. 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  2. Negative for intracranial aneurysm, CTA spot sign, or evidence of vascular malformation in association with the acute hemorrhage. 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  3. Stable intracranial mass effect since 0938 hours today, with 16 mm of leftward midline shift and trapping of  the right lateral ventricle.    Ct Head Wo Contrast  11/28/2017 IMPRESSION:  1. Stable when compared to yesterday.  2. Left MCA territory infarct with evacuated hematoma. Midline shift measures 8 mm.  3. 2-3 mm subdural hematoma along the right cerebral convexity without interval increase.    Ct Head Wo Contrast  11/27/2017 IMPRESSION:  1. Interval LEFT frontal craniotomy for basal ganglia hematoma evacuation, small amount of residual blood products and regional edema. 7 mm residual LEFT to RIGHT  midline shift, resolved RIGHT ventricular entrapment.  2. Trace RIGHT holo hemispheric and tentorial subdural hematomas. Global edema.    Ct Head Wo Contrast 11/27/2017 IMPRESSION:  Large area of hemorrhage in the left basal ganglia and left frontal lobe, 5.8 x 3.5 cm. 15 mm of left-to-right midline shift.   Mr Brain Wo Contrast 11/28/2017 IMPRESSION:  1. Postoperative changes from previous left frontal craniotomy for evacuation of left basal ganglia hematoma. Residual blood products within the evacuation cavity with persistent surrounding vasogenic edema and regional mass effect. Associated 8-9 mm of right-to-left midline shift with mild left uncal herniation similar to previous. No hydrocephalus or ventricular trapping.  2. Underlying evolving large acute ischemic left MCA territory infarct involving the majority of the left MCA territory.  3. Trace right cerebral and tentorial subdural hematoma without mass effect.  4. Right sphenoid sinusitis.  Ct Head Wo Contrast 11/29/2017 IMPRESSION:  1. Large left MCA infarct is stable in distribution.  2. Interval left frontal craniectomy with mild herniation of left frontal lobe through the defect.  3. Resolved uncal herniation, improved patency of left lateral ventricle, and 6 mm left-to-right midline shift, previously 8 mm.  4. Partial interval dispersion of acute hemorrhage in the left basal ganglia. Subcentimeter density anterior to the prior position of blood products possibly representing retracted clot or interval hemorrhage, attention at follow-up recommended.    Ct Head Wo Contrast 12/02/2017 IMPRESSION:  1. Left MCA distribution infarction is stable in distribution. Mild increased edema of the infarct with increased herniation of the left frontal lobe via the left frontal craniectomy.  2. Stable 6 mm left-to-right midline shift and partial effacement of left lateral ventricle.  3. Stable small hematoma within the left basal ganglia.  4.  No new acute intracranial abnormality identified.      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 15 mm of left-to-right midline shift.  MRI of the brain: Post craniotomy 8-9 mm of right-to-left midline shift with mild left uncal herniation similar to previous. Underlying evolving large acute ischemic left MCA territory infarct involving the majority of the left MCA territory.  Repeat Head CT post evacuation 9/20 - Left MCA territory infarct with evacuated hematoma. Midline shift measures 8 mm.   UDS - positive for amphetamines  2D Echocardiogram EF 60 to 65%  LE venous doppler negative for DVT  Hypercoagulable labs negative except high ESR and CRP  May consider TEE once improved and stable  LDL 64  HgbA1c 5.8  VTE: Heparin subq  Antithrombotic: no antithrombotics secondary to [place], started  ASA 81 12/04/17.   Therapy recommendations: signed off for now, re-consult later  Disposition: Pending     Cerebral edema  CT head midline shift 53m with uncal herniation  Repeat CT head s/p hematoma evacuation - midline shift 858m S/p hemicrani 9/20  Repeat CT 9/21 - midline shift 6m69mOn keppra for seizure prevention  CT repeat 12/02/2017 stable midline shift, large left MCA infarct, residual hematoma  3% saline d/c'ed, now on NS  Na stable at 150   DC hypertonic saline 12/06/17 and check na daily - 143 today  Respiratory failure  Intubated on low dose sedation  CCM on board  Continue on vent  Tolerating weaning  trials, but mental status no enough for extubation  Likely need trach and PEG   Seizure  Episode of stiffness, tongue biting  Overnight EEG no seizure  Increased keppra from 500 to 1000m45md  No more seizure since  Seizure precautions  Hypertension emergency  SBP stable but on the high end  BP goal <160   On norvasc and lisinopril  On labetalol to 300mg62mimes daily  Clonidine 0.2 mg Q 6 hrs prn SBP > 160 starts today per CCM  BP better today - 12/08/2017   Anemia - pt is in her period  Hb 11.8 on admission  Hemoglobin 8.4 -> 7.7-> 8.2->7.2->6.9->PRBC->8.3->8.6->8.1->7.7->8.2  Received one unit PRBC   Continue to monitor  Iron study iron 36 and ferritin 66  Put on iron pills  PRBC transfusion if b < 7.0  Leukocytosis   WBC 21.5-16.8-> 14.4-> 13.3->13.4->13.5->13.8->13.7->14.3->13.9  Afebrile  Continue monitoring   Other Stroke Risk Factors  ETOH use   Other Active Problems  ADHD - on Adderell at home  Anxiety  Depression  Hypokalemia - 3.1  Seen by Dr AroraRory Percy after midnight on 9/28 - Na 149 -> 23.4% x 1 -> repeat Na 143 12/08/17 . CCM on board. Hypertonic saline dc'd 12/06/17. Check Na only once daily. 143 (12/08/2017)     Hospital day # 11 Plan tracheostomy and PEG tube tomorrowk. No family available for discussion of the bedside.discussed with Dr. ByrumLamonte Sakaiical care M.D. For trach and Dr. [place] Silostracheostomy This patient is critically ill due to large left MCA infarct, uncal herniation s/p hemicrani, large hemorrhagic conversion, intubated and at significant risk of neurological worsening, death form stroke recurrence, hematoma expansion, cerebral edema and uncal herniation, seizure. This patient's care requires constant monitoring of vital signs, hemodynamics, respiratory and cardiac monitoring, review of multiple databases, neurological assessment, discussion with family, other specialists and medical decision making of high  complexity. I spent 32 minutes of neurocritical care time in the care of this patient. PramoAntony ContrasMedical Director [facility] Pager: [phone] 2:50 PM To contact Stroke Continuity provider, please refer to [email or web address], contact General Neurology

## 2017-12-08 NOTE — Consult Note (Signed)
[facility] Surgery Consult Note  Tammie Miles Jan 18, 1976  [id number].    Requesting MD: Leonie Man Chief Complaint/Reason for Consult: PEG Placement  HPI:  Patient is a 42 year old female who presented 9/19 with complaint of "not acting correctly" and dropping objects. Brought to emergency room as code stroke. Evaluation in ED revealed large left basal ganglia and frontal hemorrhage with shift and early uncal herniation. Neurosurgery took patient to OR for decompressive craniotomy and evacuation of hematoma. Patient course complicated by cerebral edema, encephalopathy, and unable to extubate. 9/25 patient developed seizures. Patient with a history of HTN. Prescribed antihypertensives, antidepressants, stimulants and xanax. Patient does have a reported history of alcohol abuse but extent unclear. No drug use reported.  We are asked to see for consideration of PEG placement for feeding access. Patient has been tolerating TF at 30 cc/h and has a rectal pouch in place.   ROS: Review of Systems  Unable to perform ROS: Intubated    Family History  Problem Relation Age of Onset  . Hypertension Mother   . Hypertension Father     Past Medical History:  Diagnosis Date  . ADHD   . Anxiety   . Depression   . Hypertension     Social History:  reports that she drinks alcohol. Her tobacco and drug histories are not on file.  Allergies: No Known Allergies  Medications Prior to Admission  Medication Sig Dispense Refill  . ALPRAZolam (XANAX) 1 MG tablet Take 1 mg by mouth 3 (three) times daily.   5  . amphetamine-dextroamphetamine (ADDERALL) 30 MG tablet Take 15-30 mg by mouth See admin instructions. Take one tablet (30 mg) by mouth every morning and at noon, take 1/2 tablet (15 mg) daily at 4pm  0    Blood pressure (!) 161/93, pulse 86, temperature 98.8 F (37.1 C), temperature source Axillary, resp. rate (!) 21, height 5' 1"  (1.549 m), weight 77.6 kg, SpO2 100 %. Physical  Exam: Physical Exam  Constitutional: She appears well-developed and well-nourished. She is uncooperative. She is easily aroused.  Non-toxic appearance. No distress. She is intubated.  HENT:  Head: Normocephalic.  Right Ear: External ear normal.  Left Ear: External ear normal.  Nose: Nose normal.  Mouth/Throat: Mucous membranes are normal.  Staples present with incisions c/d/i  Eyes: Conjunctivae are normal. Right eye exhibits chemosis. Right eye exhibits no discharge. Left eye exhibits chemosis. Left eye exhibits no discharge. No scleral icterus.  R pupil 4 mm and reactive to light; L pupil 3 mm and non-reactive  Neck: Neck supple. No tracheal deviation present. No thyromegaly present.  Cardiovascular: Normal rate.  Pulses:      Radial pulses are 2+ on the right side, and 2+ on the left side.       Dorsalis pedis pulses are 2+ on the right side, and 2+ on the left side.  BP 183/107 on my exam   Pulmonary/Chest: Breath sounds normal. She is intubated.  Abdominal: Soft. Bowel sounds are normal. She exhibits no distension. There is no rigidity, no rebound and no guarding.  Skull flap incision in RUQ with staples present c/d/i  Genitourinary:  Genitourinary Comments: Foley present  Musculoskeletal:  No gross deformities of bilateral upper and lower extremities  Neurological: She is easily aroused.  Opens eyes to voice. Did not follow any commands  Skin:  Multiple ecchymotic areas on thighs and legs  Psychiatric:  Not assessable    Results for orders placed or performed during the hospital  encounter of 11/27/17 (from the past 48 hour(s))  Glucose, capillary     Status: Abnormal   Collection Time: 12/06/17  3:08 PM  Result Value Ref Range   Glucose-Capillary 113 (H) 70 - 99 mg/dL   Comment 1 Notify RN    Comment 2 Document in Chart   Glucose, capillary     Status: Abnormal   Collection Time: 12/06/17  7:38 PM  Result Value Ref Range   Glucose-Capillary 101 (H) 70 - 99 mg/dL   Glucose, capillary     Status: Abnormal   Collection Time: 12/07/17 12:09 AM  Result Value Ref Range   Glucose-Capillary 104 (H) 70 - 99 mg/dL  APTT     Status: Abnormal   Collection Time: 12/07/17  3:22 AM  Result Value Ref Range   aPTT 38 (H) 24 - 36 seconds    Comment:        IF BASELINE aPTT IS ELEVATED, SUGGEST PATIENT RISK ASSESSMENT BE USED TO DETERMINE APPROPRIATE ANTICOAGULANT THERAPY. Performed at [facility], [place] [address] [place], [place] [address]   CBC with Differential/Platelet     Status: Abnormal   Collection Time: 12/07/17  3:22 AM  Result Value Ref Range   WBC 14.3 (H) 4.0 - 10.5 K/uL   RBC 3.10 (L) 3.87 - 5.11 MIL/uL   Hemoglobin 7.7 (L) 12.0 - 15.0 g/dL   HCT 26.0 (L) 36.0 - 46.0 %   MCV 83.9 78.0 - 100.0 fL   MCH 24.8 (L) 26.0 - 34.0 pg   MCHC 29.6 (L) 30.0 - 36.0 g/dL   RDW 21.3 (H) 11.5 - 15.5 %   Platelets 240 150 - 400 K/uL   Neutrophils Relative % 70 %   Lymphocytes Relative 22 %   Monocytes Relative 6 %   Eosinophils Relative 2 %   Basophils Relative 0 %   Neutro Abs 10.0 (H) 1.7 - 7.7 K/uL   Lymphs Abs 3.1 0.7 - 4.0 K/uL   Monocytes Absolute 0.9 0.1 - 1.0 K/uL   Eosinophils Absolute 0.3 0.0 - 0.7 K/uL   Basophils Absolute 0.0 0.0 - 0.1 K/uL   RBC Morphology POLYCHROMASIA PRESENT     Comment: Performed at [facility], [address] [place], [place] [address]  Comprehensive metabolic panel     Status: Abnormal   Collection Time: 12/07/17  3:22 AM  Result Value Ref Range   Sodium 147 (H) 135 - 145 mmol/L   Potassium 3.5 3.5 - 5.1 mmol/L   Chloride 115 (H) 98 - 111 mmol/L   CO2 24 22 - 32 mmol/L   Glucose, Bld 120 (H) 70 - 99 mg/dL   BUN 23 (H) 6 - 20 mg/dL   Creatinine, Ser 0.61 0.44 - 1.00 mg/dL   Calcium 8.7 (L) 8.9 - 10.3 mg/dL   Total Protein 6.1 (L) 6.5 - 8.1 g/dL   Albumin 2.1 (L) 3.5 - 5.0 g/dL   AST 31 15 - 41 U/L   ALT 36 0 - 44 U/L   Alkaline Phosphatase 178 (H) 38 - 126 U/L   Total Bilirubin 0.4 0.3 - 1.2  mg/dL   GFR calc non Af Amer >60 >60 mL/min   GFR calc Af Amer >60 >60 mL/min    Comment: (NOTE) The eGFR has been calculated using the CKD EPI equation. This calculation has not been validated in all clinical situations. eGFR's persistently <60 mL/min signify possible Chronic Kidney Disease.    Anion gap 8 5 -  15    Comment: Performed at [facility], [place] [address] [place], [place] [address]  Magnesium     Status: None   Collection Time: 12/07/17  3:22 AM  Result Value Ref Range   Magnesium 2.2 1.7 - 2.4 mg/dL    Comment: Performed at [facility], [place] [address] [place], [place] [address]  Protime-INR     Status: None   Collection Time: 12/07/17  3:22 AM  Result Value Ref Range   Prothrombin Time 14.4 11.4 - 15.2 seconds   INR 1.13     Comment: Performed at [facility] [address], [place], [place] [address]  Glucose, capillary     Status: Abnormal   Collection Time: 12/07/17  3:34 AM  Result Value Ref Range   Glucose-Capillary 112 (H) 70 - 99 mg/dL  Blood gas, arterial     Status: Abnormal   Collection Time: 12/07/17  6:11 AM  Result Value Ref Range   FIO2 0.30    Delivery systems VENTILATOR    Mode CONTINUOUS POSITIVE AIRWAY PRESSURE    Peep/cpap 5.0 cm H20   Pressure support 8.0 cm H20   pH, Arterial 7.448 7.350 - 7.450   pCO2 arterial 37.2 32.0 - 48.0 mmHg   pO2, Arterial 140 (H) 83.0 - 108.0 mmHg   Bicarbonate 25.4 20.0 - 28.0 mmol/L   Acid-Base Excess 1.7 0.0 - 2.0 mmol/L   O2 Saturation 99.1 %   Patient temperature 98.6    Collection site RIGHT RADIAL    Drawn by [id number]    Sample type ARTERIAL DRAW    Allens test (pass/fail) PASS PASS  Glucose, capillary     Status: Abnormal   Collection Time: 12/07/17  7:29 AM  Result Value Ref Range   Glucose-Capillary 104 (H) 70 - 99 mg/dL  Glucose, capillary     Status: Abnormal   Collection Time: 12/07/17 11:20 AM  Result Value Ref Range   Glucose-Capillary 112 (H) 70 - 99 mg/dL  Glucose,  capillary     Status: Abnormal   Collection Time: 12/07/17  4:49 PM  Result Value Ref Range   Glucose-Capillary 105 (H) 70 - 99 mg/dL  Glucose, capillary     Status: Abnormal   Collection Time: 12/07/17  7:12 PM  Result Value Ref Range   Glucose-Capillary 115 (H) 70 - 99 mg/dL  Glucose, capillary     Status: Abnormal   Collection Time: 12/07/17 11:25 PM  Result Value Ref Range   Glucose-Capillary 121 (H) 70 - 99 mg/dL  Glucose, capillary     Status: Abnormal   Collection Time: 12/08/17  3:22 AM  Result Value Ref Range   Glucose-Capillary 103 (H) 70 - 99 mg/dL  Blood gas, arterial     Status: Abnormal   Collection Time: 12/08/17  4:14 AM  Result Value Ref Range   FIO2 30.00    Delivery systems VENTILATOR    Mode PRESSURE CONTROL    LHR 16 resp/min   Pressure control 15 cm H20   pH, Arterial 7.444 7.350 - 7.450   pCO2 arterial 36.7 32.0 - 48.0 mmHg   pO2, Arterial 109 (H) 83.0 - 108.0 mmHg   Bicarbonate 24.6 20.0 - 28.0 mmol/L   Acid-Base Excess 1.1 0.0 - 2.0 mmol/L   O2 Saturation 98.2 %   Patient temperature 99.7    Collection site RIGHT RADIAL    Drawn by [id number]    Sample type ARTERIAL DRAW  Allens test (pass/fail) PASS PASS  APTT     Status: None   Collection Time: 12/08/17  5:06 AM  Result Value Ref Range   aPTT 35 24 - 36 seconds    Comment: Performed at [facility] [address] [place], [place] [address]  CBC with Differential/Platelet     Status: Abnormal   Collection Time: 12/08/17  5:06 AM  Result Value Ref Range   WBC 13.9 (H) 4.0 - 10.5 K/uL   RBC 3.27 (L) 3.87 - 5.11 MIL/uL   Hemoglobin 8.2 (L) 12.0 - 15.0 g/dL   HCT 27.3 (L) 36.0 - 46.0 %   MCV 83.5 78.0 - 100.0 fL   MCH 25.1 (L) 26.0 - 34.0 pg   MCHC 30.0 30.0 - 36.0 g/dL   RDW 21.2 (H) 11.5 - 15.5 %   Platelets 280 150 - 400 K/uL   Neutrophils Relative % 72 %   Lymphocytes Relative 21 %   Monocytes Relative 5 %   Eosinophils Relative 2 %   Basophils Relative 0 %   Neutro Abs 10.0 (H)  1.7 - 7.7 K/uL   Lymphs Abs 2.9 0.7 - 4.0 K/uL   Monocytes Absolute 0.7 0.1 - 1.0 K/uL   Eosinophils Absolute 0.3 0.0 - 0.7 K/uL   Basophils Absolute 0.0 0.0 - 0.1 K/uL   Smear Review MORPHOLOGY UNREMARKABLE     Comment: Performed at [facility], [place] [address] [place], [place] [address]  Comprehensive metabolic panel     Status: Abnormal   Collection Time: 12/08/17  5:06 AM  Result Value Ref Range   Sodium 143 135 - 145 mmol/L   Potassium 3.1 (L) 3.5 - 5.1 mmol/L   Chloride 112 (H) 98 - 111 mmol/L   CO2 23 22 - 32 mmol/L   Glucose, Bld 127 (H) 70 - 99 mg/dL   BUN 19 6 - 20 mg/dL   Creatinine, Ser 0.52 0.44 - 1.00 mg/dL   Calcium 8.5 (L) 8.9 - 10.3 mg/dL   Total Protein 6.4 (L) 6.5 - 8.1 g/dL   Albumin 2.1 (L) 3.5 - 5.0 g/dL   AST 29 15 - 41 U/L   ALT 32 0 - 44 U/L   Alkaline Phosphatase 160 (H) 38 - 126 U/L   Total Bilirubin 0.5 0.3 - 1.2 mg/dL   GFR calc non Af Amer >60 >60 mL/min   GFR calc Af Amer >60 >60 mL/min    Comment: (NOTE) The eGFR has been calculated using the CKD EPI equation. This calculation has not been validated in all clinical situations. eGFR's persistently <60 mL/min signify possible Chronic Kidney Disease.    Anion gap 8 5 - 15    Comment: Performed at [facility] [address] [place], [place] [address]  Protime-INR     Status: None   Collection Time: 12/08/17  5:06 AM  Result Value Ref Range   Prothrombin Time 14.0 11.4 - 15.2 seconds   INR 1.09     Comment: Performed at [facility] [address], [place],  [address]  Magnesium     Status: None   Collection Time: 12/08/17  5:06 AM  Result Value Ref Range   Magnesium 2.0 1.7 - 2.4 mg/dL    Comment: Performed at [facility] [address] [place], [place] [address]  Glucose, capillary     Status: Abnormal   Collection Time: 12/08/17  7:31 AM  Result Value Ref Range  Glucose-Capillary 102 (H) 70 - 99 mg/dL   Comment 1 Notify RN    Comment 2 Document in  Chart   Glucose, capillary     Status: Abnormal   Collection Time: 12/08/17 11:31 AM  Result Value Ref Range   Glucose-Capillary 119 (H) 70 - 99 mg/dL   Comment 1 Notify RN    Comment 2 Document in Chart    Dg Chest Port 1 View  Result Date: 12/08/2017 CLINICAL DATA:  Left frontal hemorrhage, respiratory failure, intubated patient. EXAM: PORTABLE CHEST 1 VIEW COMPARISON:  Portable chest x-ray of December 06, 2017 FINDINGS: The lungs are well-expanded. There is no focal infiltrate. The heart is top-normal in size. The pulmonary vascularity is not clearly engorged. There is no pleural effusion. The endotracheal tube tip projects 3.5 cm above the carina. The left subclavian venous catheter tip projects over the distal third of the SVC. The esophagogastric tube tip in proximal port project below the GE junction. IMPRESSION: Stable appearance of the chest. No pneumonia nor CHF. The support tubes are in reasonable position. Electronically Signed   By: David  Martinique M.D.   On: 12/08/2017 08:22      Assessment/Plan CVA  - left MCA with large hemorrhagic transformation and uncal herniation, s/p hemicraniotomy for evacuation Cerebral edema VDRF - planning trach tomorrow Seizures HTN emergency Anemia Leukocytosis  PEG placement - will plan for PEG placement tomorrow at the bedside at 1:30 PM - obtain consent, hold TF after MN - hold SQ heparin  Brigid Re, [facility] Surgery 12/08/2017, 12:39 PM Pager: [phone] Mon-Fri 7:00 am-4:30 pm Sat-Sun 7:00 am-11:30 am

## 2017-12-08 NOTE — Progress Notes (Signed)
Wasted of Fentanyl from a bag, witness by Ferman Hamming, RN.

## 2017-12-08 NOTE — Progress Notes (Signed)
NAME:  Tammie Miles, MRN:  [id number], DOB:  October 13, 1975, LOS: 11 ADMISSION DATE:  11/27/2017, CONSULTATION DATE:  11/27/2017 REFERRING MD:  Dr. Conchita Paris CHIEF COMPLAINT:  Left frontal hemorrhage  Brief History   33 yoF admitted 9/19 with AMS that began the evening of 9/18, initially thought due to intoxication. Remained unresponsive through am of 9/19.  Patient was brought to the ED where a CT was done and showed a large left sided ICH. Patient was taken to the OR and a mini craniectomy was done.  Patient was brought out to the ICU and PCCM was consulted for vent management. Course complicated by cerebral edema requiring 3% saline limited by hypernatremia. Also complicated by encephalopathy. Tolerating vent wean, but mental status limited extubation. Then 9/25 she developed what appeared to be seizures with gaze deviation and tongue biting. EEG done overnight with results pending and AEDs adjusted. No further seizure seen.   Significant Hospital Events   9/19 Admit/ OR- mini crani 9/20 Decompressive craniectomy ( L)  with skull harvesting in the abdomen 9/21 hypertonic saline started 9/23 hypertonic stopped.  9/25 Seizure. EEG done with no sz activity. AEDs adjusted.  Consults: date of consult/date signed off & final recs  9/19 NSGY 9/19 PCCM   Procedures (surgical and bedside):  9/19 Intubated 9/19 OR for crani, evac of hematoma>> no change in neuro status post op 9/20 Decompressive craniectomy ( L)  with skull harvesting in the abdomen  Significant Diagnostic Tests:  9/19 [facility]  Large area of hemorrhage in the left basal ganglia and left frontal lobe, 5.8 x 3.5 cm. 15 mm of left-to-right midline shift. 9/19 CTA head: Positive for left MCA M1 large vessel occlusion, and also severe stenosis at the left ICA terminus, superimposed on the relatively large acute left hemisphere intra-axial hemorrhage (estimated blood volume 43 mL). Vasospasm suspected in the proximal ACAs. 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 2. Negative for intracranial aneurysm, CTA spot sign, or evidence of vascular malformation in association with the acute hemorrhage. 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 3. Stable intracranial mass effect since 0938 hours today, with 16 mm of leftward midline shift and trapping of the right lateral ventricle. 11/29/2017 CT Head Large left MCA infarct is stable in distribution. Interval left frontal craniectomy with mild herniation of left frontal lobe through the defect. Resolved uncal herniation, improved patency of left lateral ventricle, and 6 mm left-to-right midline shift, previously 8 mm. Partial interval dispersion of acute hemorrhage in the left basal ganglia. Subcentimeter density anterior to the prior position of blood products possibly representing retracted clot or interval hemorrhage, attention at follow-up recommended. CT 9/24: 1. Left MCA distribution infarction is stable in distribution. Mild increased edema of the infarct with increased herniation of the left frontal lobe via the left frontal craniectomy. 2. Stable 6 mm left-to-right midline shift and partial effacement of left lateral ventricle. 3. Stable small hematoma within the left basal ganglia. 4. No new acute intracranial abnormality identified.  Micro Data: 9/19 MRSA PCR >> Negative 9/19 Sputum Culture >> normal flora 9/22 Blood x 2 >> negative 9/22 UA >> Normal 9/22 Sputum >> few candida dubliniensis  Antimicrobials:  9/19 Cefazolin (pre-op); 9/20  Subjective:  RN reports fentanyl gtt turned off this am for assessment.  No acute events overnight.  Cleviprex gtt off.  On cooling  blanket.  Tmax 100.5.    Objective   Blood pressure (!) 146/86, pulse 73, temperature 99.5 F (37.5 C), temperature source  Rectal, resp. rate 14, height 5\' 1"  (1.549 m), weight 77.6 kg, SpO2 100 %.    Vent Mode: PSV;CPAP FiO2 (%):  [30 %] 30 % Set Rate:  [16 bmp] 16 bmp PEEP:  [5 cmH20] 5 cmH20 Pressure Support:  [8 cmH20] 8 cmH20 Plateau Pressure:  [11 cmH20-16 cmH20] 13 cmH20   Intake/Output Summary (Last 24 hours) at 12/08/2017 0926 Last data filed at 12/08/2017 1610 Gross per 24 hour  Intake 2174.46 ml  Output 2825 ml  Net -650.54 ml   Filed Weights   12/06/17 0453 12/07/17 0500 12/08/17 0500  Weight: 75.6 kg 79 kg 77.6 kg    Examination:  General: ill appearing adult female lying in bed on vent HEENT: MM pink/moist, ETT, Left crani staples intact  Neuro: opens eyes to voice, looks toward provider but does not follow commands CV: s1s2 rrr, no m/r/g PULM: even/non-labored, lungs bilaterally clear  RU:EAVW, non-tender, bsx4 active  Extremities: warm/dry, trace generalized edema  Skin: no rashes or lesions.  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 Now status post hemicraniotomy for evacuation. Complicated by cerebral edema requiring hypertonic saline, which has since been stopped. She remains slow to improve.  P:  Per Neuro / NSGY  SBP goal < 180 Change to PRN sedation only  Passive ROM / PT efforts   Hypertensive Crisis - improved Hx HTN P:  ICU monitoring  BP goal as above  Continue norvasc 10 mg QD, Labetalol 300 mg TID, lisinopril 20 mg BID Add clonidine 0.1 mg QD  Acute respiratory insufficiency in the setting of massive IPH P: PSV as tolerated  Plan for trach 10/1 at 3pm per Dr. Molli Knock  [facility] as rest mode Follow intermittent CXR  Fever- improved Leukocytosis -stable - likely neuro driven. PCT 0.13, remains reassuring. Sputum culture with few candida.  P: Monitor off abx PRN tylenol for fever    Hypokalemia Hypophos Hyperchloremia P: Trend BMP / urinary output Replace electrolytes as indicated, 40 mEq kcl 9/30  Avoid nephrotoxic agents, ensure adequate renal perfusion  Anemia - Transfused one unit PRBC 9/25 for Hgb 6.9. Only evidence of bleeding is menses.  P: Trend CBC  Transfuse for Hgb <7%, active bleeding  SCD's for DVT prophylaxis  Follow GYN bleeding / menses   Protein calorie malnutrition P: TF per Nutrition  Follow CBG's   Possible ETOH abuse - unclear usage P:  Empiric thiamine, folate   Hx ADHD/ depression / anxiety P:  Supportive care  Hold adderall indefinitely   Disposition / Summary of Today's Plan 12/08/17   Plan for trach 10/1 at 3pm.  Continue PSV wean as tolerated.      Diet: TF  Pain/Anxiety/Delirium protocol: PRN versed, fentanyl  DVT prophylaxis: SCDs only  GI prophylaxis: pepcid  Hyperglycemia protocol: SSI Mobility: BR Code Status: full Family Communication: 9/30 spoke with son, reviewed patients overall state of health.  He is very articulate in his understanding of her illness.   He requests that we continue full support / proceed with trach as he wishes to give his little sister as much time with her mother as possible (she is 7).    Labs   CBC: Recent Labs  Lab 12/04/17 0507  12/05/17 0522 12/06/17 0456 12/07/17 0322 12/08/17 0506  WBC 13.5* 13.8* 13.7* 14.3* 13.9*  NEUTROABS  --   --   --  10.0* 10.0*  HGB 8.6* 8.1* 7.7* 7.7* 8.2*  HCT 28.8* 27.3* 25.8* 26.0* 27.3*  MCV 83.7 83.7 84.0 83.9 83.5  PLT 228 257 250 240 280   Basic Metabolic Panel: Recent Labs  Lab 12/02/17 0518  12/03/17 0515  12/04/17 0507  12/05/17 0522 12/05/17 1813 12/05/17 2312 12/06/17 0456 12/07/17 0322 12/08/17 0506  NA 156*   < > 159*   < > 153*   < > 153* 151* 149* 150* 147* 143  K 3.5  --  4.0  --  4.0  --  4.0  --   --  3.6 3.5 3.1*  CL 125*  --  125*  --  122*  --  119*  --   --  120* 115* 112*  CO2 25  --  25  --  26  --  26   --   --  24 24 23   GLUCOSE 142*  --  126*  --  128*  --  123*  --   --  124* 120* 127*  BUN 26*  --  31*  --  27*  --  27*  --   --  23* 23* 19  CREATININE 0.80  --  0.78  --  0.64  --  0.77  --   --  0.60 0.61 0.52  CALCIUM 8.0*  --  8.4*  --  9.0  --  8.7*  --   --  8.7* 8.7* 8.5*  MG 2.3  --  2.6*  --   --   --  2.4  --   --  2.4 2.2 2.0  PHOS 3.6  --  4.6  --  4.3  --  4.1  --   --  3.3  --   --    < > = values in this interval not displayed.   GFR: Estimated Creatinine Clearance: 86.3 mL/min (by C-G formula based on SCr of 0.52 mg/dL). Recent Labs  Lab 12/02/17 0518  12/05/17 0522 12/06/17 0456 12/07/17 0322 12/08/17 0506  PROCALCITON 0.13  --   --   --   --   --   WBC 13.3*   < > 13.8* 13.7* 14.3* 13.9*   < > = values in this interval not displayed.   Liver Function Tests: Recent Labs  Lab 12/03/17 0515 12/04/17 0507 12/05/17 0522 12/07/17 0322 12/08/17 0506  AST  --   --   --  31 29  ALT  --   --   --  36 32  ALKPHOS  --   --   --  178* 160*  BILITOT  --   --   --  0.4 0.5  PROT  --   --   --  6.1* 6.4*  ALBUMIN 1.9* 2.0* 2.1* 2.1* 2.1*   No results for input(s): LIPASE, AMYLASE in the last 168 hours. No results for input(s): AMMONIA in the last 168 hours. ABG    Component Value Date/Time   PHART 7.444 12/08/2017 0414   PCO2ART 36.7 12/08/2017 0414   PO2ART 109 (H) 12/08/2017 0414   HCO3 24.6 12/08/2017 0414   TCO2 27 12/05/2017 1216   ACIDBASEDEF 6.0 (H) 11/29/2017 0310   O2SAT 98.2 12/08/2017 0414    Coagulation Profile: Recent Labs  Lab 12/07/17 0322 12/08/17 0506  INR 1.13 1.09   Cardiac  Enzymes: No results for input(s): CKTOTAL, CKMB, CKMBINDEX, TROPONINI in the last 168 hours. HbA1C: Hgb A1c MFr Bld  Date/Time Value Ref Range Status  11/28/2017 08:32 AM 5.8 (H) 4.8 - 5.6 % Final    Comment:    (NOTE) Pre diabetes:          5.7%-6.4% Diabetes:              >6.4% Glycemic control for   <7.0% adults with diabetes    CBG: Recent Labs   Lab 12/07/17 1649 12/07/17 1912 12/07/17 2325 12/08/17 0322 12/08/17 0731  GLUCAP 105* 115* 121* 103* 102*    Canary Brim, NP-C [facility] Pgr: [phone] or if no answer [phone] 12/08/2017, 9:26 AM

## 2017-12-09 ENCOUNTER — Encounter (HOSPITAL_COMMUNITY)
Admission: EM | Disposition: A | Discharge status: Rehabilitation Facility | Payer: Self-pay | Source: Home / Self Care | Attending: Neurology

## 2017-12-09 ENCOUNTER — Inpatient Hospital Stay (HOSPITAL_COMMUNITY): Payer: Medicaid Other

## 2017-12-09 HISTORY — PX: ESOPHAGOGASTRODUODENOSCOPY: SHX5428

## 2017-12-09 HISTORY — PX: PEG PLACEMENT: SHX5437

## 2017-12-09 LAB — CBC
HCT: 26.6 % — ABNORMAL LOW (ref 36.0–46.0)
HEMOGLOBIN: 8.1 g/dL — AB (ref 12.0–15.0)
MCH: 25.2 pg — AB (ref 26.0–34.0)
MCHC: 30.5 g/dL (ref 30.0–36.0)
MCV: 82.6 fL (ref 78.0–100.0)
Platelets: 319 10*3/uL (ref 150–400)
RBC: 3.22 MIL/uL — ABNORMAL LOW (ref 3.87–5.11)
RDW: 21 % — ABNORMAL HIGH (ref 11.5–15.5)
WBC: 15 10*3/uL — ABNORMAL HIGH (ref 4.0–10.5)

## 2017-12-09 LAB — BASIC METABOLIC PANEL
Anion gap: 13 (ref 5–15)
BUN: 23 mg/dL — AB (ref 6–20)
CO2: 23 mmol/L (ref 22–32)
Calcium: 9.5 mg/dL (ref 8.9–10.3)
Chloride: 107 mmol/L (ref 98–111)
Creatinine, Ser: 0.64 mg/dL (ref 0.44–1.00)
GFR calc Af Amer: >60 mL/min (ref >60)
GFR calc non Af Amer: >60 mL/min (ref >60)
Glucose, Bld: 88 mg/dL (ref 70–99)
Potassium: 4.1 mmol/L (ref 3.5–5.1)
SODIUM: 143 mmol/L (ref 135–145)

## 2017-12-09 LAB — GLUCOSE, CAPILLARY
GLUCOSE-CAPILLARY: 91 mg/dL (ref 70–99)
GLUCOSE-CAPILLARY: 72 mg/dL (ref 70–99)
Glucose-Capillary: 71 mg/dL (ref 70–99)
Glucose-Capillary: 108 mg/dL — ABNORMAL HIGH (ref 70–99)
Glucose-Capillary: 96 mg/dL (ref 70–99)
Glucose-Capillary: 80 mg/dL (ref 70–99)

## 2017-12-09 LAB — MAGNESIUM: MAGNESIUM: 2.2 mg/dL (ref 1.7–2.4)

## 2017-12-09 LAB — PROTIME-INR
INR: 1.13
Prothrombin Time: 14.4 seconds (ref 11.4–15.2)

## 2017-12-09 SURGERY — EGD (ESOPHAGOGASTRODUODENOSCOPY)
Anesthesia: Monitor Anesthesia Care

## 2017-12-09 MED ORDER — VITAL HIGH PROTEIN PO LIQD
1000.0000 mL | ORAL | Status: DC
  Administered 2017-12-09 – 2017-12-11 (×4): 1000 mL

## 2017-12-09 MED ORDER — VECURONIUM BROMIDE 10 MG IV SOLR
10.0000 mg | Freq: Once | INTRAVENOUS | Status: AC
  Administered 2017-12-09: 10 mg via INTRAVENOUS

## 2017-12-09 MED ORDER — ALBUTEROL SULFATE (2.5 MG/3ML) 0.083% IN NEBU
2.5000 mg | INHALATION_SOLUTION | Freq: Once | RESPIRATORY_TRACT | Status: AC
  Administered 2017-12-09: 2.5 mg via RESPIRATORY_TRACT
  Filled 2017-12-09: qty 3

## 2017-12-09 MED ORDER — MIDAZOLAM HCL 2 MG/2ML IJ SOLN
4.0000 mg | Freq: Once | INTRAMUSCULAR | Status: AC
  Administered 2017-12-09: 2 mg via INTRAVENOUS
  Filled 2017-12-09: qty 4

## 2017-12-09 MED ORDER — VECURONIUM BROMIDE 10 MG IV SOLR
INTRAVENOUS | Status: AC
  Filled 2017-12-09: qty 10

## 2017-12-09 MED ORDER — CHLORHEXIDINE GLUCONATE 0.12 % MT SOLN
OROMUCOSAL | Status: AC
  Filled 2017-12-09: qty 15

## 2017-12-09 MED ORDER — FENTANYL CITRATE (PF) 100 MCG/2ML IJ SOLN
100.0000 ug | Freq: Once | INTRAMUSCULAR | Status: AC
  Administered 2017-12-09: 100 ug via INTRAVENOUS
  Filled 2017-12-09: qty 2

## 2017-12-09 MED ORDER — CLONIDINE HCL 0.1 MG PO TABS
0.1000 mg | ORAL_TABLET | Freq: Two times a day (BID) | ORAL | Status: DC
  Administered 2017-12-09 – 2017-12-10 (×2): 0.1 mg via ORAL
  Filled 2017-12-09 (×2): qty 1

## 2017-12-09 MED ORDER — LABETALOL HCL 300 MG PO TABS
300.0000 mg | ORAL_TABLET | Freq: Three times a day (TID) | ORAL | Status: DC
Start: 2017-12-09 — End: 2017-12-23
  Administered 2017-12-09 – 2017-12-23 (×44): 300 mg via ORAL
  Filled 2017-12-09 (×47): qty 1

## 2017-12-09 NOTE — Procedures (Signed)
Percutaneous Tracheostomy Placement  Consent from family.  Patient sedated, paralyzed and position.  Placed on 100% FiO2 and RR matched.  Area cleaned and draped.  Lidocaine/epi injected.  Skin incision done followed by blunt dissection.  Trachea palpated then punctured, catheter passed and visualized bronchoscopically.  Wire placed and visualized.  Catheter removed.  Airway then entered and dilated.  Size 6 cuffed shiley trach placed and visualized bronchoscopically well above carina.  Good volume returns.  Patient tolerated the procedure well without complications.  Minimal blood loss.  CXR ordered and pending.  Wesam G. Yacoub, M.D. [facility]. Pager: [phone]. After hours pager: [phone].  

## 2017-12-09 NOTE — Care Management Note (Signed)
Case Management Note  Patient Details  Name: Tammie Miles MRN: [id number] Date of Birth: 04-26-1975 Subjective/Objective:                  Pt is a 42 y/o with pmh significant for HTN, anxiety and ADHD, admitted with AMS/ unresponsiveness.  CT showed a large left sided ICH, pt s/p crani 9/19.  9/20 pt returned to OR for decompressive crani with L skull harvested in the abdomen.  PTA, pt independent, lives with 8yo child.   Action/Plan: PT/OT recommending SNF at dc.    Expected Discharge Date:                  Expected Discharge Plan:  Skilled Nursing Facility  In-House Referral:  Clinical Social Work  Discharge planning Services  CM Consult  Post Acute Care Choice:    Choice offered to:     DME Arranged:    DME Agency:     HH Arranged:    HH Agency:     Status of Service:  In process, will continue to follow  If discussed at Long Length of Stay Meetings, dates discussed:    Additional Comments:  12/09/17 J.Charlisha Market, RN, BSN Pt s/p PEG and tracheostomy today.  Plan SNF once stable on trach.  Glennon Mac, RN 12/09/2017, 4:58 PM

## 2017-12-09 NOTE — Procedures (Signed)
Bronchoscopy Procedure Note JAYDI BRAY [id number] Aug 06, 1975  Procedure: Bronchoscopy Indications: To facilitate percutanous trach placement. Please also refer to Dr Percival Spanish note.   Procedure Details Consent: Risks of procedure as well as the alternatives and risks of each were explained to the (patient/caregiver).  Consent for procedure obtained. Time Out: Verified patient identification, verified procedure, site/side was marked, verified correct patient position, special equipment/implants available, medications/allergies/relevent history reviewed, required imaging and test results available.  Performed  In preparation for procedure, patient was given 100% FiO2 and bronchoscope lubricated. Sedation: Benzodiazepines, Muscle relaxants and Fentanyl  FOB introduced through ETT. Tube withdrawn to allow trach placement. Visualized introduction of needle and catheter into trachea. No evidence posterior tracheal wall puncture or injury. Dilation and introduction of trach over guidewire watched in real time. No significant bleeding. ETT withdrawn and then airway inspection performed via new trach. No endobronchial lesions or secretions noted. No significant blood loss.   Evaluation Hemodynamic Status: Transient hypertension requiring treatment; O2 sats: stable throughout Patient's Current Condition: stable Specimens:  None Complications: No apparent complications Patient did tolerate procedure well.   Levy Pupa, MD, PhD 12/09/2017, 3:52 PM [facility] [phone] or if no answer [phone]

## 2017-12-09 NOTE — Progress Notes (Signed)
Pt's right forearm infiltrated right after giving 2 mg Versed, 100 mcg Fentanyl, 20 mg Etomidate and 10 mg Vecuronium.  Was able to use right hand to give medications again to adequately prepare the pt prior to her tracheostomy.  She received 2 mg Versed, 300 mcg Fentanyl, 20 mg Etomidate, 10 mg Vecuronium and 40 mg Labetalol.  VSS remained stable throughout the procedure.

## 2017-12-09 NOTE — Progress Notes (Signed)
Nutrition Follow-up  DOCUMENTATION CODES:   Obesity unspecified  INTERVENTION:   Vital High Protein @ 30 ml/hr (720 ml/day) via PEG 30 ml Prostat TID MVI daily  Provides: 1020 kcal, 108 grams protein, and 630 ml free water.   NUTRITION DIAGNOSIS:   Inadequate oral intake related to inability to eat as evidenced by NPO status. Ongoing.   GOAL:   Provide needs based on ASPEN/SCCM guidelines Met.   MONITOR:   TF tolerance, Vent status  ASSESSMENT:   Pt with PMH of HTN with prescribed stimulant use admitted with large L hemorrhagic basal ganglia with 15 mm shift with early herniation s/p L frontotemporal craniotomy for evacuation of hematoma.   9/19 intubated, OR for crani evacuation 9/20 decompressive L craniectomy 10/1 trach, PEG, per MD give time for neuro to recover  Patient is currently intubated on ventilator support Temp (24hrs), Avg:99.6 F (37.6 C), Min:98.9 F (37.2 C), Max:100.2 F (37.9 C)   Medications reviewed and include: colace BID, ferrous sulfate BID, folic acid, MVI, senokot-s daily, thiamine,  Labs reviewed:  BP: 164/102 MAP: 120   I/O: +60 ml since admit UOP: 3900 ml x 24 hrs   Diet Order:   Diet Order            Diet NPO time specified  Diet effective midnight              EDUCATION NEEDS:   No education needs have been identified at this time  Skin:  Skin Assessment: Reviewed RN Assessment(L head incision)  Last BM:  unmeasured x 1 9/30; rectal tube in place  Height:   Ht Readings from Last 1 Encounters:  12/01/17 5' 1" (1.549 m)    Weight:   Wt Readings from Last 1 Encounters:  12/09/17 77.5 kg    Ideal Body Weight:  47.7 kg  BMI:  Body mass index is 32.28 kg/m.  Estimated Nutritional Needs:   Kcal:  [phone]  Protein:  > 95 grams   Fluid:  > 1.5 L  Maylon Peppers RD, LDN, CNSC [phone] Pager [phone] After Hours Pager

## 2017-12-09 NOTE — Progress Notes (Addendum)
NAME:  Tammie Miles, MRN:  [id number], DOB:  1975-03-19, LOS: 12 ADMISSION DATE:  11/27/2017, CONSULTATION DATE:  11/27/2017 REFERRING MD:  Dr. Conchita Paris CHIEF COMPLAINT:  Left frontal hemorrhage  Brief History   61 yoF admitted 9/19 with AMS that began the evening of 9/18, initially thought due to intoxication. Remained unresponsive through am of 9/19.  Patient was brought to the ED where a CT was done and showed a large left sided ICH. Patient was taken to the OR and a mini craniectomy was done.  Patient was brought out to the ICU and PCCM was consulted for vent management. Course complicated by cerebral edema requiring 3% saline limited by hypernatremia. Also complicated by encephalopathy. Tolerating vent wean, but mental status limited extubation. Then 9/25 she developed what appeared to be seizures with gaze deviation and tongue biting. EEG done overnight with results pending and AEDs adjusted. No further seizure seen.   Significant Hospital Events   9/19 Admit/ OR- mini crani 9/20 Decompressive craniectomy ( L)  with skull harvesting in the abdomen 9/21 hypertonic saline started 9/23 hypertonic stopped.  9/25 Seizure. EEG done with no sz activity. AEDs adjusted. 10/1 Planned trach / PEG  Consults: date of consult/date signed off & final recs  9/19 NSGY 9/19 PCCM   Procedures (surgical and bedside):  9/19 Intubated 9/19 OR for crani, evac of hematoma>> no change in neuro status post op 9/20 Decompressive craniectomy ( L)  with skull harvesting in the abdomen  Significant Diagnostic Tests:  9/19 [facility]  Large area of hemorrhage in the left basal ganglia and left frontal lobe, 5.8 x 3.5 cm. 15 mm of left-to-right midline shift. 9/19 CTA head: Positive for left MCA M1 large vessel occlusion, and also severe stenosis at the left ICA terminus, superimposed on the relatively large acute left hemisphere intra-axial hemorrhage (estimated blood volume 43 mL). Vasospasm suspected in the proximal  ACAs. 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 2. Negative for intracranial aneurysm, CTA spot sign, or evidence of vascular malformation in association with the acute hemorrhage. 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 3. Stable intracranial mass effect since 0938 hours today, with 16 mm of leftward midline shift and trapping of the right lateral ventricle. 11/29/2017 CT Head Large left MCA infarct is stable in distribution. Interval left frontal craniectomy with mild herniation of left frontal lobe through the defect. Resolved uncal herniation, improved patency of left lateral ventricle, and 6 mm left-to-right midline shift, previously 8 mm. Partial interval dispersion of acute hemorrhage in the left basal ganglia. Subcentimeter density anterior to the prior position of blood products possibly representing retracted clot or interval hemorrhage, attention at follow-up recommended. CT 9/24: 1. Left MCA distribution infarction is stable in distribution. Mild increased edema of the infarct with increased herniation of the left frontal lobe via the left frontal craniectomy. 2. Stable 6 mm left-to-right midline shift and partial effacement of left lateral ventricle. 3. Stable small hematoma within the left basal ganglia. 4. No new acute intracranial abnormality identified.  Micro Data: 9/19 MRSA PCR >> Negative 9/19 Sputum Culture >> normal flora 9/22 Blood x 2 >> negative 9/22 UA >> Normal 9/22 Sputum >> few candida dubliniensis  Antimicrobials:  9/19 Cefazolin (pre-op) 9/20  Subjective:  RN reports BP continues to remain elevated despite multiple agents, within goal range.  Tmax 100.2.  Objective   Blood pressure (!) 157/93, pulse 84, temperature 99.2 F (37.3 C), temperature source Esophageal, resp.  rate 17, height 5\' 1"  (1.549 m), weight 77.5 kg, SpO2 100 %.    Vent Mode: PCV FiO2 (%):  [30 %] 30 % Set Rate:  [16 bmp] 16 bmp PEEP:  [5 cmH20] 5 cmH20 Pressure Support:  [8 cmH20] 8 cmH20 Plateau Pressure:  [13 cmH20-16 cmH20] 13 cmH20   Intake/Output Summary (Last 24 hours) at 12/09/2017 1138 Last data filed at 12/09/2017 1100 Gross per 24 hour  Intake 1915.42 ml  Output 2825 ml  Net -909.58 ml   Filed Weights   12/07/17 0500 12/08/17 0500 12/09/17 0339  Weight: 79 kg 77.6 kg 77.5 kg    Examination:  General: ill appearing adult female in NAD on vent  HEENT: MM pink/moist, ETT Neuro:  CV: s1s2 rrr, no m/r/g.  L [place] catheter removed / site clean. PULM: even/non-labored, lungs bilaterally coarse AV:WUJW, non-tender, bsx4 active, skull flat in right abd / dressing intact  Extremities: warm/dry, trace LE edema  Skin: no rashes or lesions.  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 Now status post hemicraniotomy for evacuation. Complicated by cerebral edema requiring hypertonic saline, which has since been stopped. She remains slow to improve.  P:  Per Neuro / NSGY  SBP goal <180  PRN sedation only  ROM efforts   Hypertensive Crisis - improved Hx HTN P:  ICU monitoring  SBP goal < 180 Continue Norvasc, Labetalol, Lisinopril, HCTZ Increase clonidine to 0.1 BID   Acute respiratory insufficiency in the setting of massive IPH P: PSV as tolerated  Trach 10/1 at 3pm  PRVC as rest mode  Pre-trach orders in place. INR 1.13 10/1. Follow CXR   Fever- improved Leukocytosis -stable - likely neuro driven. PCT 0.13, remains reassuring. Sputum culture with few candida.  P: Monitor off abx  PRN tylenol for fever  L [place] TLC removed 9/30   Hypokalemia Hypophos Hyperchloremia P: Trend BMP / urinary output Replace  electrolytes as indicated Avoid nephrotoxic agents, ensure adequate renal perfusion  Anemia - Transfused one unit PRBC 9/25 for Hgb 6.9. Only evidence of bleeding is menses.  P: Trend CBC  Transfuse per ICU guidelines  SCD's for DVT prophylaxis   Protein calorie malnutrition P: TF per nutrition  Follow CBG's    Possible ETOH abuse - unclear usage P:  Continue thiamine, folate   Hx ADHD/ depression / anxiety P:  Supportive care  Hold adderall indefinitely    Disposition / Summary of Today's Plan 12/09/17   Trach at 3pm.  PEG planned at 1230.  Continue to titrate BP medications.    Diet: TF  Pain/Anxiety/Delirium protocol: PRN versed, fentanyl  DVT prophylaxis: SCDs only  GI prophylaxis: pepcid  Hyperglycemia protocol: SSI Mobility: BR Code Status: full Family Communication: 9/30 spoke with son, reviewed patients overall state of health.  He is very articulate in his understanding of her illness.   He requests that we continue full support / proceed with trach as he wishes to give his little sister as much time with her mother as possible (she is 7).   No family available 10/1.    Labs   CBC: Recent Labs  Lab 12/05/17 0522 12/06/17 0456 12/07/17 0322 12/08/17 0506 12/09/17 0332  WBC 13.8* 13.7* 14.3* 13.9* 15.0*  NEUTROABS  --   --  10.0* 10.0*  --   HGB 8.1* 7.7* 7.7* 8.2* 8.1*  HCT 27.3* 25.8* 26.0* 27.3* 26.6*  MCV 83.7 84.0 83.9 83.5 82.6  PLT 257 250 240 280 319   Basic Metabolic Panel: Recent Labs  Lab 12/03/17 0515  12/04/17 0507  12/05/17 0522  12/05/17 2312 12/06/17 0456 12/07/17 0322 12/08/17 0506 12/09/17 0332  NA 159*   < > 153*   < > 153*   < > 149* 150* 147* 143 143  K 4.0  --  4.0  --  4.0  --   --  3.6 3.5 3.1* 4.1  CL 125*  --  122*  --  119*  --   --  120* 115* 112* 107  CO2 25  --  26  --  26  --   --  24 24 23 23   GLUCOSE 126*  --  128*  --  123*  --   --  124* 120* 127* 88  BUN 31*  --  27*  --  27*  --   --  23* 23* 19 23*    CREATININE 0.78  --  0.64  --  0.77  --   --  0.60 0.61 0.52 0.64  CALCIUM 8.4*  --  9.0  --  8.7*  --   --  8.7* 8.7* 8.5* 9.5  MG 2.6*  --   --   --  2.4  --   --  2.4 2.2 2.0 2.2  PHOS 4.6  --  4.3  --  4.1  --   --  3.3  --   --   --    < > = values in this interval not displayed.   GFR: Estimated Creatinine Clearance: 86.3 mL/min (by C-G formula based on SCr of 0.64 mg/dL). Recent Labs  Lab 12/06/17 0456 12/07/17 0322 12/08/17 0506 12/09/17 0332  WBC 13.7* 14.3* 13.9* 15.0*   Liver Function Tests: Recent Labs  Lab 12/03/17 0515 12/04/17 0507 12/05/17 0522 12/07/17 0322 12/08/17 0506  AST  --   --   --  31 29  ALT  --   --   --  36 32  ALKPHOS  --   --   --  178* 160*  BILITOT  --   --   --  0.4 0.5  PROT  --   --   --  6.1* 6.4*  ALBUMIN 1.9* 2.0* 2.1* 2.1* 2.1*   No results for input(s): LIPASE, AMYLASE in the last 168 hours. No results for input(s): AMMONIA in the last 168 hours. ABG    Component Value Date/Time   PHART 7.444 12/08/2017 0414   PCO2ART 36.7 12/08/2017 0414   PO2ART 109 (H) 12/08/2017 0414   HCO3 24.6 12/08/2017 0414   TCO2 27 12/05/2017 1216   ACIDBASEDEF 6.0 (H) 11/29/2017 0310   O2SAT 98.2 12/08/2017 0414    Coagulation Profile: Recent Labs  Lab 12/07/17 0322 12/08/17 0506 12/09/17 0332  INR 1.13 1.09 1.13   Cardiac Enzymes: No results for input(s): CKTOTAL, CKMB, CKMBINDEX, TROPONINI in the last 168 hours. HbA1C: Hgb A1c MFr Bld  Date/Time Value Ref Range Status  11/28/2017 08:32 AM 5.8 (H) 4.8 - 5.6 % Final    Comment:    (NOTE) Pre diabetes:          5.7%-6.4% Diabetes:              >6.4% Glycemic control for   <7.0%  adults with diabetes    CBG: Recent Labs  Lab 12/08/17 1541 12/08/17 1924 12/08/17 2310 12/09/17 0313 12/09/17 0733  GLUCAP 96 90 100* 91 71    Canary Brim, NP-C [facility] Pgr: [phone] or if no answer [phone] 12/09/2017, 11:38 AM

## 2017-12-09 NOTE — Op Note (Signed)
[facility] Patient Name: Tammie Miles Procedure Date : 12/09/2017 MRN: [id number] Attending MD: Violeta Gelinas , MD Date of Birth: [birth year] CSN: [id number] Age: 42 Admit Type: Inpatient Procedure:                Upper GI endoscopy Indications:              Place PEG because patient is unable to eat due to                            stroke (CVA) Providers:                Violeta Gelinas, MD, Carlena Bjornstad, PA-C Referring MD:             Lina Sayre Medicines:                 Complications:            No immediate complications. Estimated Blood Loss:      Procedure:                Pre-Anesthesia Assessment:                           - Prior to the procedure, a History and Physical                            was performed, and patient medications and                            allergies were reviewed. The patient is unable to                            give consent secondary to the patient's altered                            mental status. The risks and benefits of the                            procedure and the sedation options and risks were                            discussed with the patient's son. All questions                            were answered and informed consent was obtained.                            Patient identification and proposed procedure were                            verified by the physician, the nurse and the                            technician in the procedure room. Mental Status  Examination: sedated. ASA Grade Assessment: III - A                            patient with severe systemic disease. After                            reviewing the risks and benefits, the patient was                            deemed in satisfactory condition to undergo the                            procedure. The anesthesia plan was to use deep                            sedation / analgesia. Immediately prior to                             administration of medications, the patient was                            re-assessed for adequacy to receive sedatives. The                            heart rate, respiratory rate, oxygen saturations,                            blood pressure, adequacy of pulmonary ventilation,                            and response to care were monitored throughout the                            procedure. The physical status of the patient was                            re-assessed after the procedure.                           After obtaining informed consent, the endoscope was                            passed under direct vision. Throughout the                            procedure, the patient's blood pressure, pulse, and                            oxygen saturations were monitored continuously. The                            upper GI endoscopy was accomplished without  difficulty. The patient tolerated the procedure                            well. The [id number]-[id number] ([id number]) Olympus Adult EGD was                            introduced through the and advanced to the duodenal                            bulb. Findings:      No gross lesions were noted in the upper third of the esophagus.      No gross lesions were noted in the entire examined stomach. Placement of       a removable PEG with no T-fasteners was successfully completed. The       external bumper was at the 4.0 cm marking on the tube. Estimated blood       loss was minimal.      No gross lesions were noted in the duodenal bulb. Impression:               - No gross lesions in esophagus.                           - No gross lesions in the stomach.                           - No gross lesions in the duodenal bulb.                           - An endoscopically removable PEG placement was                            successfully completed.                           - No specimens collected. Recommendation:             Procedure Code(s):        --- Professional ---                           [phone], Esophagogastroduodenoscopy, flexible,                            transoral; with directed placement of percutaneous                            gastrostomy tube Diagnosis Code(s):        --- Professional ---                           G31.517, Other sequelae of cerebral infarction                           Z43.1, Encounter for attention to gastrostomy                           R63.3, Feeding difficulties CPT copyright  2017 [employer]. All rights reserved. The codes documented in this report are preliminary and upon coder review may  be revised to meet current compliance requirements. Violeta Gelinas, MD 12/09/2017 1:05:09 PM This report has been signed electronically. Number of Addenda: 0

## 2017-12-09 NOTE — Progress Notes (Signed)
Patient ID: Tammie Miles, female   DOB: 08/04/1975, 42 y.o.   MRN: [id number] For PEG placement at bedside today. I spoke with her son, Sondra Come on the phone and explained the procedure, risks, and benefits. He agrees. SQ heparin held as CCM will proceed with bedside trach later today as well.  Violeta Gelinas, MD, MPH, FACS Trauma: [phone] General Surgery: [phone]

## 2017-12-09 NOTE — Progress Notes (Signed)
NEUROHOSPITALISTS STROKE TEAM - DAILY PROGRESS NOTE   SUBJECTIVE No family is at bedside.  RN stated that son spoke to CCM M.D. And wants trach and PEG. Pt neuro stable, no significant change. Still intubated on  fentanyl. No seizure activity. Trach and PEG planned for later this morning   OBJECTIVE Most recent Vital Signs: Vitals:   12/09/17 0700 12/09/17 0729 12/09/17 0730 12/09/17 0800  BP: (!) 164/98 (!) 178/101 (!) 174/108 (!) 181/110  Pulse: 80 84 84 87  Resp: _0 Temp:   100.2 F (37.9 C)   TempSrc:   Esophageal   SpO2: 100% 100% 100% 100%  Weight:      Height:       CBG (last 3)  Recent Labs    12/08/17 2310 12/09/17 0313 12/09/17 0733  GLUCAP 100* 91 71    Physical Exam  Temp:  [98.8 F (37.1 C)-100.2 F (37.9 C)] 100.2 F (37.9 C) (10/01 0730) Pulse Rate:  [72-87] 87 (10/01 0800) Resp:  [14-26] 18 (10/01 0800) BP: (141-193)/(11-130) 181/110 (10/01 0800) SpO2:  [100 %] 100 % (10/01 0800) FiO2 (%):  [30 %] 30 % (10/01 0729) Weight:  [77.5 kg] 77.5 kg (10/01 0339)   HEENT-status post left frontoparietal craniotomy with wound clean dry and stitches intact  cardiovascular- S1-S2 audible, pulses palpable throughout   Lungs- low breath sounds bilaterally Abdomen-hypoactive bowel sounds  musculoskeletal-no joint tenderness, deformity or swelling Skin-warm and dry Neurological Exam :   - intubated  able to open eyes on voice, b/l eyelid swollen, R>L but improving, PERRL, eye left gaze preference but able to cross midline. Not blinking to visual threat consistently, not tracking. Left corneal positive, and right absent. Gag positive. Facial symmetry not able to test due to ET. On pain stimulation, LUE localized to pain and able to against gravity. LLE withdraw to pain 2+/5. RUE flaccid and RLE mild withdraw to pain. B/l babinski positive. Sensation, coordination and gait not tested.    Lipid Panel      Component Value Date/Time   CHOL 138 11/28/2017 0832   TRIG 414 (H) 11/30/2017 1658   HDL 42 11/28/2017 0832   CHOLHDL 3.3 11/28/2017 0832   VLDL 32 11/28/2017 0832   LDLCALC 64 11/28/2017 0832   HgbA1C  Lab Results  Component Value Date   HGBA1C 5.8 (H) 11/28/2017    Urine Drug Screen:      Component Value Date/Time   LABOPIA NONE DETECTED 11/27/2017 1415   COCAINSCRNUR NONE DETECTED 11/27/2017 1415   LABBENZ NONE DETECTED 11/27/2017 1415   AMPHETMU POSITIVE (A) 11/27/2017 1415   THCU NONE DETECTED 11/27/2017 1415   LABBARB NONE DETECTED 11/27/2017 1415    Alcohol Level:  No results for input(s): ETH in the last 168 hours.    IMAGING  Ct Angio Head W Or Wo Contrast  IMPRESSION:  1. Positive for left MCA M1 large vessel occlusion, and also severe stenosis at the left ICA terminus, superimposed on the relatively large acute left hemisphere intra-axial hemorrhage (estimated blood volume 43 mL). Vasospasm suspected in the proximal ACAs. 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  2. Negative for intracranial aneurysm, CTA spot sign, or evidence of vascular malformation in association with the acute hemorrhage. 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  3. Stable intracranial mass effect since 0938 hours today,  with 16 mm of leftward midline shift and trapping of the right lateral ventricle.    Ct Head Wo Contrast  11/28/2017 IMPRESSION:  1. Stable when compared to yesterday.  2. Left MCA territory infarct with evacuated hematoma. Midline shift measures 8 mm.  3. 2-3 mm subdural hematoma along the right cerebral convexity without interval increase.    Ct Head Wo Contrast  11/27/2017 IMPRESSION:  1. Interval LEFT frontal craniotomy for basal ganglia hematoma evacuation, small amount of  residual blood products and regional edema. 7 mm residual LEFT to RIGHT midline shift, resolved RIGHT ventricular entrapment.  2. Trace RIGHT holo hemispheric and tentorial subdural hematomas. Global edema.    Ct Head Wo Contrast 11/27/2017 IMPRESSION:  Large area of hemorrhage in the left basal ganglia and left frontal lobe, 5.8 x 3.5 cm. 15 mm of left-to-right midline shift.   Mr Brain Wo Contrast 11/28/2017 IMPRESSION:  1. Postoperative changes from previous left frontal craniotomy for evacuation of left basal ganglia hematoma. Residual blood products within the evacuation cavity with persistent surrounding vasogenic edema and regional mass effect. Associated 8-9 mm of right-to-left midline shift with mild left uncal herniation similar to previous. No hydrocephalus or ventricular trapping.  2. Underlying evolving large acute ischemic left MCA territory infarct involving the majority of the left MCA territory.  3. Trace right cerebral and tentorial subdural hematoma without mass effect.  4. Right sphenoid sinusitis.  Ct Head Wo Contrast 11/29/2017 IMPRESSION:  1. Large left MCA infarct is stable in distribution.  2. Interval left frontal craniectomy with mild herniation of left frontal lobe through the defect.  3. Resolved uncal herniation, improved patency of left lateral ventricle, and 6 mm left-to-right midline shift, previously 8 mm.  4. Partial interval dispersion of acute hemorrhage in the left basal ganglia. Subcentimeter density anterior to the prior position of blood products possibly representing retracted clot or interval hemorrhage, attention at follow-up recommended.    Ct Head Wo Contrast 12/02/2017 IMPRESSION:  1. Left MCA distribution infarction is stable in distribution. Mild increased edema of the infarct with increased herniation of the left frontal lobe via the left frontal craniectomy.  2. Stable 6 mm left-to-right midline shift and partial effacement of left lateral  ventricle.  3. Stable small hematoma within the left basal ganglia.  4. No new acute intracranial abnormality identified.      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 15 mm of left-to-right midline shift.  MRI of the brain: Post craniotomy 8-9 mm of right-to-left midline shift with mild left uncal herniation similar to previous. Underlying evolving large acute ischemic left MCA territory infarct involving the majority of the left MCA territory.  Repeat Head CT post evacuation 9/20 - Left MCA territory infarct with evacuated hematoma. Midline shift measures 8 mm.   UDS - positive for amphetamines  2D Echocardiogram EF 60 to 65%  LE venous doppler negative for DVT  Hypercoagulable labs negative except high ESR and CRP  May consider TEE once improved and stable  LDL 64  HgbA1c 5.8  VTE:  Heparin subq  Antithrombotic: no antithrombotics secondary to [place], started ASA 81 12/04/17.   Therapy recommendations: signed off for now, re-consult later  Disposition: Pending     Cerebral edema  CT head midline shift 18m with uncal herniation  Repeat CT head s/p hematoma evacuation - midline shift 852m S/p hemicrani 9/20  Repeat CT 9/21 - midline shift 61m72mOn keppra for seizure prevention  CT repeat 12/02/2017 stable midline shift, large left MCA infarct, residual hematoma  3% saline d/c'ed, now on NS  Na stable at 150   DC hypertonic saline 12/06/17 and check na daily - 143 today  Respiratory failure  Intubated on low  dose sedation  CCM on board  Continue on vent  Tolerating weaning trials, but mental status no enough for extubation  Likely need trach and PEG   Seizure  Episode of stiffness, tongue biting  Overnight EEG no seizure  Increased keppra from 500 to 1000m43md  No more seizure since  Seizure precautions  Hypertension emergency  SBP stable but on the high end  BP goal <160   On norvasc and lisinopril  On labetalol to 300mg77mimes daily  Clonidine 0.2 mg Q 6 hrs prn SBP > 160 starts today per CCM  BP better today - 12/08/2017   Anemia - pt is in her period  Hb 11.8 on admission  Hemoglobin 8.4 -> 7.7-> 8.2->7.2->6.9->PRBC->8.3->8.6->8.1->7.7->8.2  Received one unit PRBC   Continue to monitor  Iron study iron 36 and ferritin 66  Put on iron pills  PRBC transfusion if b < 7.0  Leukocytosis   WBC 21.5-16.8-> 14.4-> 13.3->13.4->13.5->13.8->13.7->14.3->13.9  Afebrile  Continue monitoring   Other Stroke Risk Factors  ETOH use   Other Active Problems  ADHD - on Adderell at home  Anxiety  Depression  Hypokalemia - 3.1  Seen by Dr AroraRory Percy after midnight on 9/28 - Na 149 -> 23.4% x 1 -> repeat Na 143 12/08/17 . CCM on board. Hypertonic saline dc'd 12/06/17. Check Na only once daily. 143 (12/08/2017)     Hospital day # 12 Plan tracheostomy and PEG tube today. No family available for discussion of the bedside.discussed with Dr. ByrumLamonte Sakaiical care M.D. For trach and Dr. [place] Silostracheostomy This patient is critically ill due to large left MCA infarct, uncal herniation s/p hemicrani, large hemorrhagic conversion, intubated and at significant risk of neurological worsening, death form stroke recurrence, hematoma expansion, cerebral edema and uncal herniation, seizure. This patient's care requires constant monitoring of vital signs, hemodynamics, respiratory and cardiac monitoring, review of multiple databases, neurological assessment, discussion with  family, other specialists and medical decision making of high complexity. I spent 30 minutes of neurocritical care time in the care of this patient.  PramoAntony Contras To contact Stroke Continuity provider, please refer to [email or web address], contact General Neurology

## 2017-12-09 NOTE — Progress Notes (Deleted)
NAME:  Tammie Miles, MRN:  [id number], DOB:  April 09, 1975, LOS: 12 ADMISSION DATE:  11/27/2017, CONSULTATION DATE:  11/27/2017 REFERRING MD:  Dr. Conchita Paris CHIEF COMPLAINT:  Left frontal hemorrhage  Brief History   39 yoF admitted 9/19 with AMS that began the evening of 9/18, initially thought due to intoxication. Remained unresponsive through am of 9/19.  Patient was brought to the ED where a CT was done and showed a large left sided ICH. Patient was taken to the OR and a mini craniectomy was done.  Patient was brought out to the ICU and PCCM was consulted for vent management. Course complicated by cerebral edema requiring 3% saline limited by hypernatremia. Also complicated by encephalopathy. Tolerating vent wean, but mental status limited extubation. Then 9/25 she developed what appeared to be seizures with gaze deviation and tongue biting. EEG done overnight with results pending and AEDs adjusted. No further seizure seen.   Significant Hospital Events   9/19 Admit/ OR- mini crani 9/20 Decompressive craniectomy ( L)  with skull harvesting in the abdomen 9/21 hypertonic saline started 9/23 hypertonic stopped.  9/25 Seizure. EEG done with no sz activity. AEDs adjusted. 10/1 Planned trach / PEG  Consults: date of consult/date signed off & final recs  9/19 NSGY 9/19 PCCM   Procedures (surgical and bedside):  9/19 Intubated 9/19 OR for crani, evac of hematoma>> no change in neuro status post op 9/20 Decompressive craniectomy ( L)  with skull harvesting in the abdomen  Significant Diagnostic Tests:  9/19 [facility]  Large area of hemorrhage in the left basal ganglia and left frontal lobe, 5.8 x 3.5 cm. 15 mm of left-to-right midline shift. 9/19 CTA head: Positive for left MCA M1 large vessel occlusion, and also severe stenosis at the left ICA terminus, superimposed on the relatively large acute left hemisphere intra-axial hemorrhage (estimated blood volume 43 mL). Vasospasm suspected in the proximal  ACAs. 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 2. Negative for intracranial aneurysm, CTA spot sign, or evidence of vascular malformation in association with the acute hemorrhage. 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 3. Stable intracranial mass effect since 0938 hours today, with 16 mm of leftward midline shift and trapping of the right lateral ventricle. 11/29/2017 CT Head Large left MCA infarct is stable in distribution. Interval left frontal craniectomy with mild herniation of left frontal lobe through the defect. Resolved uncal herniation, improved patency of left lateral ventricle, and 6 mm left-to-right midline shift, previously 8 mm. Partial interval dispersion of acute hemorrhage in the left basal ganglia. Subcentimeter density anterior to the prior position of blood products possibly representing retracted clot or interval hemorrhage, attention at follow-up recommended. CT 9/24: 1. Left MCA distribution infarction is stable in distribution. Mild increased edema of the infarct with increased herniation of the left frontal lobe via the left frontal craniectomy. 2. Stable 6 mm left-to-right midline shift and partial effacement of left lateral ventricle. 3. Stable small hematoma within the left basal ganglia. 4. No new acute intracranial abnormality identified.  Micro Data: 9/19 MRSA PCR >> Negative 9/19 Sputum Culture >> normal flora 9/22 Blood x 2 >> negative 9/22 UA >> Normal 9/22 Sputum >> few candida dubliniensis  Antimicrobials:  9/19 Cefazolin (pre-op) 9/20  Subjective:  RN reports BP continues to remain elevated despite multiple agents, within goal range.  Tmax 100.2.  Objective   Blood pressure (!) 157/93, pulse 84, temperature 99.2 F (37.3 C), temperature source Esophageal, resp.  rate 17, height 5\' 1"  (1.549 m), weight 77.5 kg, SpO2 100 %.    Vent Mode: PCV FiO2 (%):  [30 %] 30 % Set Rate:  [16 bmp] 16 bmp PEEP:  [5 cmH20] 5 cmH20 Pressure Support:  [8 cmH20] 8 cmH20 Plateau Pressure:  [13 cmH20-16 cmH20] 13 cmH20   Intake/Output Summary (Last 24 hours) at 12/09/2017 1153 Last data filed at 12/09/2017 1100 Gross per 24 hour  Intake 1915.42 ml  Output 2825 ml  Net -909.58 ml   Filed Weights   12/07/17 0500 12/08/17 0500 12/09/17 0339  Weight: 79 kg 77.6 kg 77.5 kg    Examination:  General: ill appearing adult female in NAD on vent  HEENT: MM pink/moist, ETT Neuro: eyes closed, pupils =, no response to voice, no follow commands, withdraws to pain, some spontaneous movement on left but no purposeful movement CV: s1s2 rrr, no m/r/g.  L [place] catheter removed / site clean. PULM: even/non-labored, lungs bilaterally coarse ZO:XWRU, non-tender, bsx4 active, skull flat in right abd / dressing intact  Extremities: warm/dry, trace LE edema  Skin: no rashes or lesions.  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 Now status post hemicraniotomy for evacuation. Complicated by cerebral edema requiring hypertonic saline, which has since been stopped. She remains slow to improve.  P:  Per Neuro / NSGY  SBP goal <180  PRN sedation only  ROM efforts   Hypertensive Crisis - improved Hx HTN P:  ICU monitoring  SBP goal < 180 Continue Norvasc, Labetalol, Lisinopril, HCTZ Increase clonidine to 0.1 BID   Acute respiratory insufficiency in the setting of massive IPH P: PSV as tolerated  Trach 10/1 at 3pm  PRVC as rest mode  Pre-trach orders in place. INR 1.13 10/1. Follow CXR   Fever- improved Leukocytosis -stable - likely neuro driven. PCT 0.13, remains reassuring. Sputum culture with few candida.  P: Monitor off  abx  PRN tylenol for fever  L [place] TLC removed 9/30  Follow fever curve / WBC trend  Remove foley   Hypokalemia Hypophos Hyperchloremia P: Trend BMP / urinary output Replace electrolytes as indicated Avoid nephrotoxic agents, ensure adequate renal perfusion  Anemia - Transfused one unit PRBC 9/25 for Hgb 6.9. Only evidence of bleeding is menses.  P: Trend CBC  Transfuse per ICU guidelines  SCD's for DVT prophylaxis   Protein calorie malnutrition P: TF per nutrition  Follow CBG's    Possible ETOH abuse - unclear usage P:  Continue thiamine, folate   Hx ADHD/ depression / anxiety P:  Supportive care  Hold adderall indefinitely    Disposition / Summary of Today's Plan 12/09/17   Trach at 3pm.  PEG planned at 1230.  Continue to titrate BP medications.    Diet: TF  Pain/Anxiety/Delirium protocol: PRN versed, fentanyl  DVT prophylaxis: SCDs only  GI prophylaxis: pepcid  Hyperglycemia protocol: SSI Mobility: BR Code Status: full Family Communication: 9/30 spoke with son, reviewed patients overall state of health.  He is very articulate in his understanding of her illness.   He requests that we continue full support / proceed with trach as he wishes to give his little sister as much time with her mother as possible (she is 7).  No family available 10/1.    Labs   CBC: Recent Labs  Lab 12/05/17 0522 12/06/17 0456 12/07/17 0322 12/08/17 0506 12/09/17 0332  WBC 13.8* 13.7* 14.3* 13.9* 15.0*  NEUTROABS  --   --  10.0* 10.0*  --   HGB 8.1* 7.7* 7.7* 8.2* 8.1*  HCT 27.3* 25.8* 26.0* 27.3* 26.6*  MCV 83.7 84.0 83.9 83.5 82.6  PLT 257 250 240 280 319   Basic Metabolic Panel: Recent Labs  Lab 12/03/17 0515  12/04/17 0507  12/05/17 0522  12/05/17 2312 12/06/17 0456 12/07/17 0322 12/08/17 0506 12/09/17 0332  NA 159*   < > 153*   < > 153*   < > 149* 150* 147* 143 143  K 4.0  --  4.0  --  4.0  --   --  3.6 3.5 3.1* 4.1  CL 125*  --  122*  --  119*  --   --   120* 115* 112* 107  CO2 25  --  26  --  26  --   --  24 24 23 23   GLUCOSE 126*  --  128*  --  123*  --   --  124* 120* 127* 88  BUN 31*  --  27*  --  27*  --   --  23* 23* 19 23*  CREATININE 0.78  --  0.64  --  0.77  --   --  0.60 0.61 0.52 0.64  CALCIUM 8.4*  --  9.0  --  8.7*  --   --  8.7* 8.7* 8.5* 9.5  MG 2.6*  --   --   --  2.4  --   --  2.4 2.2 2.0 2.2  PHOS 4.6  --  4.3  --  4.1  --   --  3.3  --   --   --    < > = values in this interval not displayed.   GFR: Estimated Creatinine Clearance: 86.3 mL/min (by C-G formula based on SCr of 0.64 mg/dL). Recent Labs  Lab 12/06/17 0456 12/07/17 0322 12/08/17 0506 12/09/17 0332  WBC 13.7* 14.3* 13.9* 15.0*   Liver Function Tests: Recent Labs  Lab 12/03/17 0515 12/04/17 0507 12/05/17 0522 12/07/17 0322 12/08/17 0506  AST  --   --   --  31 29  ALT  --   --   --  36 32  ALKPHOS  --   --   --  178* 160*  BILITOT  --   --   --  0.4 0.5  PROT  --   --   --  6.1* 6.4*  ALBUMIN 1.9* 2.0* 2.1* 2.1* 2.1*   No results for input(s): LIPASE, AMYLASE in the last 168 hours. No results for input(s): AMMONIA in the last 168 hours. ABG    Component Value Date/Time   PHART 7.444 12/08/2017 0414   PCO2ART 36.7 12/08/2017 0414   PO2ART 109 (H) 12/08/2017 0414   HCO3 24.6 12/08/2017 0414   TCO2 27 12/05/2017 1216   ACIDBASEDEF 6.0 (H) 11/29/2017 0310   O2SAT 98.2 12/08/2017 0414    Coagulation Profile: Recent Labs  Lab 12/07/17 0322 12/08/17 0506 12/09/17 0332  INR 1.13 1.09 1.13   Cardiac Enzymes: No results for input(s): CKTOTAL, CKMB, CKMBINDEX, TROPONINI in the last 168 hours. HbA1C: Hgb A1c MFr Bld  Date/Time Value Ref Range Status  11/28/2017 08:32 AM 5.8 (H) 4.8 - 5.6 % Final    Comment:    (NOTE) Pre  diabetes:          5.7%-6.4% Diabetes:              >6.4% Glycemic control for   <7.0% adults with diabetes    CBG: Recent Labs  Lab 12/08/17 1541 12/08/17 1924 12/08/17 2310 12/09/17 0313 12/09/17 0733    GLUCAP 96 90 100* 91 71    Canary Brim, NP-C [facility] Pgr: [phone] or if no answer [phone] 12/09/2017, 11:53 AM

## 2017-12-10 LAB — CBC
HCT: 30.9 % — ABNORMAL LOW (ref 36.0–46.0)
Hemoglobin: 9.6 g/dL — ABNORMAL LOW (ref 12.0–15.0)
MCH: 25.4 pg — ABNORMAL LOW (ref 26.0–34.0)
MCHC: 31.1 g/dL (ref 30.0–36.0)
MCV: 81.7 fL (ref 78.0–100.0)
Platelets: 284 10*3/uL (ref 150–400)
RBC: 3.78 MIL/uL — ABNORMAL LOW (ref 3.87–5.11)
RDW: 21.1 % — ABNORMAL HIGH (ref 11.5–15.5)
WBC: 14.5 10*3/uL — ABNORMAL HIGH (ref 4.0–10.5)

## 2017-12-10 LAB — BASIC METABOLIC PANEL
ANION GAP: 13 (ref 5–15)
Anion gap: 12 (ref 5–15)
BUN: 20 mg/dL (ref 6–20)
BUN: 18 mg/dL (ref 6–20)
CALCIUM: 9 mg/dL (ref 8.9–10.3)
CO2: 23 mmol/L (ref 22–32)
CO2: 23 mmol/L (ref 22–32)
CREATININE: 0.68 mg/dL (ref 0.44–1.00)
CREATININE: 0.68 mg/dL (ref 0.44–1.00)
Calcium: 9.2 mg/dL (ref 8.9–10.3)
Chloride: 102 mmol/L (ref 98–111)
Chloride: 103 mmol/L (ref 98–111)
GFR calc non Af Amer: >60 mL/min (ref >60)
Glucose, Bld: 96 mg/dL (ref 70–99)
Glucose, Bld: 136 mg/dL — ABNORMAL HIGH (ref 70–99)
Potassium: 4.1 mmol/L (ref 3.5–5.1)
Potassium: 3.3 mmol/L — ABNORMAL LOW (ref 3.5–5.1)
SODIUM: 137 mmol/L (ref 135–145)
Sodium: 139 mmol/L (ref 135–145)

## 2017-12-10 LAB — GLUCOSE, CAPILLARY
GLUCOSE-CAPILLARY: 83 mg/dL (ref 70–99)
GLUCOSE-CAPILLARY: 96 mg/dL (ref 70–99)
GLUCOSE-CAPILLARY: 126 mg/dL — AB (ref 70–99)
Glucose-Capillary: 104 mg/dL — ABNORMAL HIGH (ref 70–99)
Glucose-Capillary: 106 mg/dL — ABNORMAL HIGH (ref 70–99)
Glucose-Capillary: 98 mg/dL (ref 70–99)

## 2017-12-10 MED ORDER — AMANTADINE HCL 100 MG PO CAPS
100.0000 mg | ORAL_CAPSULE | Freq: Two times a day (BID) | ORAL | Status: DC
  Administered 2017-12-10 (×2): 100 mg via ORAL
  Filled 2017-12-10 (×3): qty 1

## 2017-12-10 MED ORDER — FENTANYL CITRATE (PF) 100 MCG/2ML IJ SOLN: 25.0000 ug | INTRAMUSCULAR | Status: DC | PRN

## 2017-12-10 MED ORDER — MINOXIDIL 2.5 MG PO TABS
2.5000 mg | ORAL_TABLET | Freq: Every day | ORAL | Status: DC
  Administered 2017-12-10 – 2017-12-16 (×7): 2.5 mg via ORAL
  Filled 2017-12-10 (×7): qty 1

## 2017-12-10 NOTE — Progress Notes (Signed)
Pt placed on ATC 5L, 28%, cuff deflated. Pt tolerating well at this time.

## 2017-12-10 NOTE — Progress Notes (Signed)
NAME:  Tammie Miles, MRN:  [id number], DOB:  [birth year], LOS: 13 ADMISSION DATE:  11/27/2017, CONSULTATION DATE:  11/27/2017 REFERRING MD:  Dr. Conchita Paris CHIEF COMPLAINT:  Left frontal hemorrhage  Brief History   27 yoF admitted 9/19 with AMS that began the evening of 9/18, initially thought due to intoxication. Remained unresponsive through am of 9/19.  Patient was brought to the ED where a CT was done and showed a large left sided ICH. Patient was taken to the OR and a mini craniectomy was done.  Patient was brought out to the ICU and PCCM was consulted for vent management. Course complicated by cerebral edema requiring 3% saline limited by hypernatremia. Also complicated by encephalopathy. Tolerating vent wean, but mental status limited extubation. Then 9/25 she developed what appeared to be seizures with gaze deviation and tongue biting. EEG done overnight with results pending and AEDs adjusted. No further seizure seen.   Significant Hospital Events   9/19 Admit/ OR- mini crani 9/20 Decompressive craniectomy ( L)  with skull harvesting in the abdomen 9/21 hypertonic saline started 9/23 hypertonic stopped.  9/25 Seizure. EEG done with no sz activity. AEDs adjusted. 10/1 Trach / PEG 10/2 Trach collar   Consults: date of consult/date signed off & final recs  9/19 NSGY 9/19 PCCM   Procedures (surgical and bedside):  9/19 Intubated 9/19 OR for crani, evac of hematoma>> no change in neuro status post op 9/20 Decompressive craniectomy ( L)  with skull harvesting in the abdomen  Significant Diagnostic Tests:  9/19 [facility]  Large area of hemorrhage in the left basal ganglia and left frontal lobe, 5.8 x 3.5 cm. 15 mm of left-to-right midline shift. 9/19 CTA head: Positive for left MCA M1 large vessel occlusion, and also severe stenosis at the left ICA terminus, superimposed on the relatively large acute left hemisphere intra-axial hemorrhage (estimated blood volume 43 mL). Vasospasm suspected in  the proximal ACAs. 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 2. Negative for intracranial aneurysm, CTA spot sign, or evidence of vascular malformation in association with the acute hemorrhage. 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 3. Stable intracranial mass effect since 0938 hours today, with 16 mm of leftward midline shift and trapping of the right lateral ventricle. 11/29/2017 CT Head Large left MCA infarct is stable in distribution. Interval left frontal craniectomy with mild herniation of left frontal lobe through the defect. Resolved uncal herniation, improved patency of left lateral ventricle, and 6 mm left-to-right midline shift, previously 8 mm. Partial interval dispersion of acute hemorrhage in the left basal ganglia. Subcentimeter density anterior to the prior position of blood products possibly representing retracted clot or interval hemorrhage, attention at follow-up recommended. CT 9/24: 1. Left MCA distribution infarction is stable in distribution. Mild increased edema of the infarct with increased herniation of the left frontal lobe via the left frontal craniectomy. 2. Stable 6 mm left-to-right midline shift and partial effacement of left lateral ventricle. 3. Stable small hematoma within the left basal ganglia. 4. No new acute intracranial abnormality identified.  Micro Data: 9/19 MRSA PCR >> Negative 9/19 Sputum Culture >> normal flora 9/22 Blood x 2 >> negative 9/22 UA >> Normal 9/22 Sputum >> few candida dubliniensis  Antimicrobials:  9/19 Cefazolin (pre-op) 9/20  Subjective:  RN reports BP elevated but remains under goal.  Tmax 100.7, I/O- net even  last 24 hours.     Objective   Blood pressure (!) 158/99, pulse 95, temperature 99.8 F (37.7 C), temperature source Rectal, resp.  rate (!) 22, height 5\' 1"  (1.549 m), weight 77.5 kg, SpO2 100 %.    Vent Mode: PSV;CPAP FiO2 (%):  [30 %-100 %] 30 % Set Rate:  [16 bmp] 16 bmp PEEP:  [5 cmH20] 5 cmH20 Pressure Support:  [5 cmH20] 5 cmH20 Plateau Pressure:  [13 cmH20-15 cmH20] 15 cmH20   Intake/Output Summary (Last 24 hours) at 12/10/2017 0953 Last data filed at 12/10/2017 0981 Gross per 24 hour  Intake 2187.72 ml  Output 2175 ml  Net 12.72 ml   Filed Weights   12/08/17 0500 12/09/17 0339 12/10/17 0500  Weight: 77.6 kg 77.5 kg 77.5 kg    Examination:  General: young adult female, ill appearing lying in bed HEENT: MM pink/moist, #6 trach midline c/d/i, Left crani staples intact Neuro: eyes closed, flickers eyes open to voice then closes, no follow commands, withdraw to pain on LUE, BLE, no response on RUE to pain CV: s1s2 rrr, no m/r/g PULM: even/non-labored, lungs bilaterally clear  XB:JYNW, non-tender, bsx4 active, PEG c/d/i, abdominal binder in place, tolerating TF Extremities: warm/dry, no edema  Skin: no rashes or lesions  Resolved Hospital Problem list     Assessment & Plan:   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 Now status post hemicraniotomy for evacuation. Complicated by cerebral edema requiring hypertonic saline, which has since been stopped. She remains slow to improve.  P:  SBP goal <180 Plan per Neuro / NSGY  Minimize all sedating medications  PT efforts as able / passive ROM   Hypertensive Crisis - improved Hx HTN P:  ICU monitoring  Continue norvasc, labetalol, lisinopril, HCTZ, clonidine  SBP goal as above  Acute respiratory insufficiency in the setting of massive IPH P: PSV as tolerated with goal for ATC Trach care per protocol  Clip trach sutures 10/9  Follow intermittent CXR   Fever- improved Leukocytosis -stable - likely neuro driven. PCT 0.13, remains reassuring. Sputum culture with few candida.  P: PRN tylenol for  fever  Monitor off abx   Hypokalemia Hypophos Hyperchloremia P: Trend BMP / urinary output Replace electrolytes as indicated Avoid nephrotoxic agents, ensure adequate renal perfusion  Anemia - Transfused one unit PRBC 9/25 for Hgb 6.9. Only evidence of bleeding is menses.  P: Trend CBC  Transfuse per ICU guidelines  SCD's for DVT prophylaxis   Protein calorie malnutrition P: TF per nutrition  Follow CBG's    Possible ETOH abuse - unclear usage P:  Continue thiamine, folate   Hx ADHD/ depression / anxiety P:  Supportive care Hold adderall indefinitely   Disposition / Summary of Today's Plan 12/10/17   PSV wean as tolerated, transition to ATC as goal.  Primary service starting process for SNF placement once meets criteria.     Diet: TF  Pain/Anxiety/Delirium protocol: PRN low dose fentanyl  DVT prophylaxis: SCDs only  GI prophylaxis: pepcid  Hyperglycemia protocol: SSI Mobility: BR Code Status: full Family Communication: 9/30 spoke with son, reviewed patients overall state of health.  He is very articulate in his understanding of her illness.   He requests that we continue full support / proceed with trach as he wishes to give his little sister as much time with her mother as possible (she is 7).   No family available 10/2  Labs   CBC: Recent Labs  Lab 12/06/17 0456 12/07/17 0322 12/08/17 0506 12/09/17 0332 12/10/17 0319  WBC 13.7* 14.3* 13.9* 15.0* 14.5*  NEUTROABS  --  10.0* 10.0*  --   --   HGB 7.7* 7.7* 8.2* 8.1* 9.6*  HCT 25.8* 26.0* 27.3* 26.6* 30.9*  MCV 84.0 83.9 83.5 82.6 81.7  PLT 250 240 280 319 284   Basic Metabolic Panel: Recent Labs  Lab 12/04/17 0507  12/05/17 0522  12/05/17 2312 12/06/17 0456 12/07/17 0322 12/08/17 0506 12/09/17 0332  NA 153*   < > 153*   < > 149* 150* 147* 143 143  K 4.0  --  4.0  --   --  3.6 3.5 3.1* 4.1  CL 122*  --  119*  --   --  120* 115* 112* 107  CO2 26  --  26  --   --  24 24 23 23   GLUCOSE 128*  --   123*  --   --  124* 120* 127* 88  BUN 27*  --  27*  --   --  23* 23* 19 23*  CREATININE 0.64  --  0.77  --   --  0.60 0.61 0.52 0.64  CALCIUM 9.0  --  8.7*  --   --  8.7* 8.7* 8.5* 9.5  MG  --   --  2.4  --   --  2.4 2.2 2.0 2.2  PHOS 4.3  --  4.1  --   --  3.3  --   --   --    < > = values in this interval not displayed.   GFR: Estimated Creatinine Clearance: 86.3 mL/min (by C-G formula based on SCr of 0.64 mg/dL). Recent Labs  Lab 12/07/17 0322 12/08/17 0506 12/09/17 0332 12/10/17 0319  WBC 14.3* 13.9* 15.0* 14.5*   Liver Function Tests: Recent Labs  Lab 12/04/17 0507 12/05/17 0522 12/07/17 0322 12/08/17 0506  AST  --   --  31 29  ALT  --   --  36 32  ALKPHOS  --   --  178* 160*  BILITOT  --   --  0.4 0.5  PROT  --   --  6.1* 6.4*  ALBUMIN 2.0* 2.1* 2.1* 2.1*   No results for input(s): LIPASE, AMYLASE in the last 168 hours. No results for input(s): AMMONIA in the last 168 hours. ABG    Component Value Date/Time   PHART 7.444 12/08/2017 0414   PCO2ART 36.7 12/08/2017 0414   PO2ART 109 (H) 12/08/2017 0414   HCO3 24.6 12/08/2017 0414   TCO2 27 12/05/2017 1216   ACIDBASEDEF 6.0 (H) 11/29/2017 0310   O2SAT 98.2 12/08/2017 0414    Coagulation Profile: Recent Labs  Lab 12/07/17 0322 12/08/17 0506 12/09/17 0332  INR 1.13 1.09 1.13   Cardiac Enzymes: No results for input(s): CKTOTAL, CKMB, CKMBINDEX, TROPONINI in the last 168 hours. HbA1C: Hgb A1c MFr Bld  Date/Time Value Ref Range Status  11/28/2017 08:32 AM 5.8 (H) 4.8 - 5.6 % Final    Comment:    (NOTE) Pre diabetes:          5.7%-6.4% Diabetes:              >6.4% Glycemic control for   <7.0% adults with diabetes    CBG: Recent Labs  Lab 12/09/17 1711 12/09/17 1921 12/09/17 2312 12/10/17 0313 12/10/17 0740  GLUCAP 96 72 80 83 96    Canary Brim, NP-C [facility] Pgr: [phone] or if no  answer [phone] 12/10/2017, 9:53 AM

## 2017-12-10 NOTE — Progress Notes (Signed)
NEUROHOSPITALISTS STROKE TEAM - DAILY PROGRESS NOTE   SUBJECTIVE No family is at bedside.    Pt neuro stable, no significant change. S  Trach and PEG done yesterday uneventfully. She is breathing comfortably on pressure support and CPAP of 5  OBJECTIVE Most recent Vital Signs: Vitals:   12/10/17 1000 12/10/17 1020 12/10/17 1030 12/10/17 1100  BP: (!) 147/89 (!) 147/89 126/77 117/75  Pulse: 88 80 83 79  Resp: 20 (!) 22 (!) 21 19  Temp:      TempSrc:      SpO2: 100% 100% 100% 100%  Weight:      Height:       CBG (last 3)  Recent Labs    12/09/17 2312 12/10/17 0313 12/10/17 0740  GLUCAP 80 83 96    Physical Exam  Temp:  [98.9 F (37.2 C)-100.7 F (38.2 C)] 99.8 F (37.7 C) (10/02 0800) Pulse Rate:  [70-101] 79 (10/02 1100) Resp:  [16-28] 19 (10/02 1100) BP: (117-196)/(75-132) 117/75 (10/02 1100) SpO2:  [98 %-100 %] 100 % (10/02 1100) FiO2 (%):  [28 %-100 %] 28 % (10/02 1020) Weight:  [77.5 kg] 77.5 kg (10/02 0500)   HEENT-status post left frontoparietal craniotomy with wound clean dry and stitches intact and status post tracheostomy  cardiovascular- S1-S2 audible, pulses palpable throughout   Lungs- low breath sounds bilaterally Abdomen-hypoactive bowel sounds  musculoskeletal-no joint tenderness, deformity or swelling Skin-warm and dry Neurological Exam :  Status post tracheostomy  able to open eyes on voice, b/l eyelid swollen, R>L but improving, PERRL, eye left gaze preference but able to cross midline. Not blinking to visual threat consistently, not tracking. Left corneal positive, and right absent. Gag positive. Facial symmetry not able to test due to ET. On pain stimulation, LUE localized to pain and able to against gravity. LLE withdraw to pain 2+/5. RUE flaccid and RLE mild withdraw to pain. B/l babinski positive. Sensation, coordination and gait not tested.    Lipid Panel    Component Value Date/Time   CHOL 138 11/28/2017 0832   TRIG 414 (H) 11/30/2017 1658   HDL 42 11/28/2017 0832   CHOLHDL 3.3 11/28/2017 0832   VLDL 32 11/28/2017 0832   LDLCALC 64 11/28/2017 0832   HgbA1C  Lab Results  Component Value Date   HGBA1C 5.8 (H) 11/28/2017    Urine Drug Screen:      Component Value Date/Time   LABOPIA NONE DETECTED 11/27/2017 1415   COCAINSCRNUR NONE DETECTED 11/27/2017 1415   LABBENZ NONE DETECTED 11/27/2017 1415   AMPHETMU POSITIVE (A) 11/27/2017 1415   THCU NONE DETECTED 11/27/2017 1415   LABBARB NONE DETECTED 11/27/2017 1415    Alcohol Level:  No results for input(s): ETH in the last 168 hours.    IMAGING  Ct Angio Head W Or Wo Contrast  IMPRESSION:  1. Positive for left MCA M1 large vessel occlusion, and also severe stenosis at the left ICA terminus, superimposed on the relatively large acute left hemisphere intra-axial hemorrhage (estimated blood volume 43 mL). Vasospasm suspected in the proximal ACAs. 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  2. Negative for intracranial aneurysm, CTA spot sign, or evidence of vascular malformation in association with the acute hemorrhage. 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  3. Stable intracranial mass effect since 0938 hours today, with 16 mm of leftward midline shift and  trapping of the right lateral ventricle.    Ct Head Wo Contrast  11/28/2017 IMPRESSION:  1. Stable when compared to yesterday.  2. Left MCA territory infarct with evacuated hematoma. Midline shift measures 8 mm.  3. 2-3 mm subdural hematoma along the right cerebral convexity without interval increase.    Ct Head Wo Contrast  11/27/2017 IMPRESSION:  1. Interval LEFT frontal craniotomy for basal ganglia hematoma evacuation, small amount of residual blood products and  regional edema. 7 mm residual LEFT to RIGHT midline shift, resolved RIGHT ventricular entrapment.  2. Trace RIGHT holo hemispheric and tentorial subdural hematomas. Global edema.    Ct Head Wo Contrast 11/27/2017 IMPRESSION:  Large area of hemorrhage in the left basal ganglia and left frontal lobe, 5.8 x 3.5 cm. 15 mm of left-to-right midline shift.   Mr Brain Wo Contrast 11/28/2017 IMPRESSION:  1. Postoperative changes from previous left frontal craniotomy for evacuation of left basal ganglia hematoma. Residual blood products within the evacuation cavity with persistent surrounding vasogenic edema and regional mass effect. Associated 8-9 mm of right-to-left midline shift with mild left uncal herniation similar to previous. No hydrocephalus or ventricular trapping.  2. Underlying evolving large acute ischemic left MCA territory infarct involving the majority of the left MCA territory.  3. Trace right cerebral and tentorial subdural hematoma without mass effect.  4. Right sphenoid sinusitis.  Ct Head Wo Contrast 11/29/2017 IMPRESSION:  1. Large left MCA infarct is stable in distribution.  2. Interval left frontal craniectomy with mild herniation of left frontal lobe through the defect.  3. Resolved uncal herniation, improved patency of left lateral ventricle, and 6 mm left-to-right midline shift, previously 8 mm.  4. Partial interval dispersion of acute hemorrhage in the left basal ganglia. Subcentimeter density anterior to the prior position of blood products possibly representing retracted clot or interval hemorrhage, attention at follow-up recommended.    Ct Head Wo Contrast 12/02/2017 IMPRESSION:  1. Left MCA distribution infarction is stable in distribution. Mild increased edema of the infarct with increased herniation of the left frontal lobe via the left frontal craniectomy.  2. Stable 6 mm left-to-right midline shift and partial effacement of left lateral ventricle.  3. Stable small  hematoma within the left basal ganglia.  4. No new acute intracranial abnormality identified.      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 15 mm of left-to-right midline shift.  MRI of the brain: Post craniotomy 8-9 mm of right-to-left midline shift with mild left uncal herniation similar to previous. Underlying evolving large acute ischemic left MCA territory infarct involving the majority of the left MCA territory.  Repeat Head CT post evacuation 9/20 - Left MCA territory infarct with evacuated hematoma. Midline shift measures 8 mm.   UDS - positive for amphetamines  2D Echocardiogram EF 60 to 65%  LE venous doppler negative for DVT  Hypercoagulable labs negative except high ESR and CRP  LDL 64  HgbA1c 5.8  VTE: Heparin subq  Antithrombotic: no antithrombotics secondary to [place], started ASA 81 12/04/17.   Therapy  recommendations: signed off for now, re-consult later  Disposition: Pending     Cerebral edema  CT head midline shift 89m with uncal herniation  Repeat CT head s/p hematoma evacuation - midline shift 830m S/p hemicrani 9/20  Repeat CT 9/21 - midline shift 45m45mOn keppra for seizure prevention  CT repeat 12/02/2017 stable midline shift, large left MCA infarct, residual hematoma  3% saline d/c'ed, now on NS  Na stable at 150   DC hypertonic saline 12/06/17 and check na daily - 143 today  Respiratory failure  Intubated on low dose sedation  CCM on board  Continue on vent  Tolerating weaning  trials, but mental status no enough for extubation  s/p need trach and PEG 12/09/17   Seizure  Episode of stiffness, tongue biting  Overnight EEG no seizure  Increased keppra from 500 to 1000m74md  No more seizure since  Seizure precautions  Hypertension emergency  SBP stable but on the high end  BP goal <160   On norvasc and lisinopril  On labetalol to 300mg72mimes daily  Clonidine 0.2 mg Q 6 hrs prn SBP > 160 starts today per CCM  BP better today - 12/08/2017   Anemia - pt is in her period  Hb 11.8 on admission  Hemoglobin 8.4 -> 7.7-> 8.2->7.2->6.9->PRBC->8.3->8.6->8.1->7.7->8.2  Received one unit PRBC   Continue to monitor  Iron study iron 36 and ferritin 66  Put on iron pills  PRBC transfusion if b < 7.0  Leukocytosis   WBC 21.5-16.8-> 14.4-> 13.3->13.4->13.5->13.8->13.7->14.3->13.9  Afebrile  Continue monitoring   Other Stroke Risk Factors  ETOH use   Other Active Problems  ADHD - on Adderell at home  Anxiety  Depression  Hypokalemia - 3.1  Seen by Dr AroraRory Percy after midnight on 9/28 - Na 149 -> 23.4% x 1 -> repeat Na 143 12/08/17 . CCM on board. Hypertonic saline dc'd 12/06/17. Check Na only once daily. 139 (10/2//2019)     Hospital day # 13 Plan try trach collar and weaned off ventilatory support for the next 24-48 hours. We'll transfer to the floor over the next few days and hope for nursing home transfer later this week. Discussed with critical care nurse practitioner. No family available at the bedside for discussion. This patient is critically ill due to large left MCA infarct, uncal herniation s/p hemicrani, large hemorrhagic conversion, intubated and at significant risk of neurological worsening, death form stroke recurrence, hematoma expansion, cerebral edema and uncal herniation, seizure. This patient's care requires constant monitoring of vital signs, hemodynamics, respiratory and cardiac monitoring, review of multiple  databases, neurological assessment, discussion with family, other specialists and medical decision making of high complexity. I spent 32 minutes of neurocritical care time in the care of this patient. PramoAntony ContrasMedical Director [facility] Pager: [phone] 2:34 PM   To contact Stroke Continuity provider, please refer to [email or web address], contact General Neurology

## 2017-12-11 ENCOUNTER — Inpatient Hospital Stay (HOSPITAL_COMMUNITY): Payer: Medicaid Other

## 2017-12-11 ENCOUNTER — Encounter (HOSPITAL_COMMUNITY): Payer: Self-pay | Admitting: General Surgery

## 2017-12-11 LAB — GLUCOSE, CAPILLARY
GLUCOSE-CAPILLARY: 85 mg/dL (ref 70–99)
GLUCOSE-CAPILLARY: 95 mg/dL (ref 70–99)
GLUCOSE-CAPILLARY: 130 mg/dL — AB (ref 70–99)
Glucose-Capillary: 109 mg/dL — ABNORMAL HIGH (ref 70–99)
Glucose-Capillary: 98 mg/dL (ref 70–99)

## 2017-12-11 LAB — BASIC METABOLIC PANEL
ANION GAP: 13 (ref 5–15)
BUN: 16 mg/dL (ref 6–20)
CALCIUM: 8.9 mg/dL (ref 8.9–10.3)
CO2: 22 mmol/L (ref 22–32)
Chloride: 103 mmol/L (ref 98–111)
Creatinine, Ser: 0.62 mg/dL (ref 0.44–1.00)
GFR calc Af Amer: >60 mL/min (ref >60)
GLUCOSE: 103 mg/dL — AB (ref 70–99)
Potassium: 3.8 mmol/L (ref 3.5–5.1)
Sodium: 138 mmol/L (ref 135–145)

## 2017-12-11 LAB — CBC
HCT: 28.4 % — ABNORMAL LOW (ref 36.0–46.0)
HEMOGLOBIN: 8.8 g/dL — AB (ref 12.0–15.0)
MCH: 25.3 pg — ABNORMAL LOW (ref 26.0–34.0)
MCHC: 31 g/dL (ref 30.0–36.0)
MCV: 81.6 fL (ref 78.0–100.0)
Platelets: 326 10*3/uL (ref 150–400)
RBC: 3.48 MIL/uL — AB (ref 3.87–5.11)
RDW: 21 % — ABNORMAL HIGH (ref 11.5–15.5)
WBC: 16.4 10*3/uL — AB (ref 4.0–10.5)

## 2017-12-11 MED ORDER — PRO-STAT SUGAR FREE PO LIQD
30.0000 mL | Freq: Three times a day (TID) | ORAL | Status: DC
  Administered 2017-12-11 – 2017-12-12 (×4): 30 mL
  Filled 2017-12-11 (×4): qty 30

## 2017-12-11 MED ORDER — AMANTADINE HCL 50 MG/5ML PO SYRP
100.0000 mg | ORAL_SOLUTION | Freq: Two times a day (BID) | ORAL | Status: DC
  Administered 2017-12-11 – 2017-12-27 (×32): 100 mg
  Filled 2017-12-11 (×33): qty 10

## 2017-12-11 MED ORDER — CHLORHEXIDINE GLUCONATE 0.12 % MT SOLN
OROMUCOSAL | Status: AC
  Administered 2017-12-11: 15 mL via OROMUCOSAL
  Filled 2017-12-11: qty 15

## 2017-12-11 MED ORDER — BROMOCRIPTINE MESYLATE 2.5 MG PO TABS
2.5000 mg | ORAL_TABLET | Freq: Three times a day (TID) | ORAL | Status: DC
  Administered 2017-12-11 – 2017-12-15 (×11): 2.5 mg via ORAL
  Filled 2017-12-11 (×12): qty 1

## 2017-12-11 MED ORDER — POTASSIUM CHLORIDE 20 MEQ/15ML (10%) PO SOLN
40.0000 meq | Freq: Once | ORAL | Status: AC
  Administered 2017-12-11: 40 meq
  Filled 2017-12-11: qty 30

## 2017-12-11 NOTE — Progress Notes (Addendum)
NEUROHOSPITALISTS STROKE TEAM - DAILY PROGRESS NOTE   SUBJECTIVE No family is at bedside.   Patient with trach collar in place on ventilator, non-labored breathing.  Nurse at bedside states patient's mental status and neuro status unchanged from yesterday.   Patient aphasic at this time, following any commands, but does purposely move left side, minimally withdraws right  leg to harsh stimulus.   OBJECTIVE Most recent Vital Signs: Vitals:   12/11/17 0800 12/11/17 0900 12/11/17 1000 12/11/17 1119  BP: (!) 114/57 (!) 152/118 (!) 168/89 (!) 142/57  Pulse: 98 (!) 101 (!) 103 93  Resp: (!) 25 18 (!) 27 (!) 23  Temp:   (!) 100.9 F (38.3 C)   TempSrc:   Rectal   SpO2: 100% 100% 100% 100%  Weight:      Height:       CBG (last 3)  Recent Labs    12/10/17 2339 12/11/17 0328 12/11/17 0812  GLUCAP 106* 85 95    Physical Exam  Temp:  [99.4 F (37.4 C)-101.2 F (38.4 C)] 100.9 F (38.3 C) (10/03 1000) Pulse Rate:  [80-104] 93 (10/03 1119) Resp:  [17-35] 23 (10/03 1119) BP: (114-169)/(57-118) 142/57 (10/03 1119) SpO2:  [98 %-100 %] 100 % (10/03 1119) FiO2 (%):  [28 %-30 %] 28 % (10/03 1119) Weight:  [74.6 kg] 74.6 kg (10/03 0423)   HEENT-status post left frontoparietal craniotomy with wound clean dry and stitches intact and status post tracheostomy on 11/28/17  cardiovascular- S1-S2 audible, pulses palpable throughout   Lungs- low breath sounds bilaterally, Status post tracheostomy on vent with nonlabored breathing Abdomen-hypoactive bowel sounds  musculoskeletal-no joint tenderness, deformity or swelling Skin-warm and dry Neurological Exam :  Patient is awake, aphasic, and does not respond to commands.  Ble to open eyes on voice, b/l eyelid swollen, R>L but improving, PERRL, eye left gaze preference but able to cross midline. Not blinking to visual threat consistently, not tracking, but does move pupils spontaneously. Left corneal  positive, and right absent. Gag positive.  No facial asymmetry noted.  LUE localized to pain and able to against gravity. LLE withdraw to pain 2+/5. RUE flaccid with increased tone, no response to stimuli and RLE mild withdraw to pain. B/l babinski positive. Sensation, coordination and gait not tested.    Lipid Panel    Component Value Date/Time   CHOL 138 11/28/2017 0832   TRIG 414 (H) 11/30/2017 1658   HDL 42 11/28/2017 0832   CHOLHDL 3.3 11/28/2017 0832   VLDL 32 11/28/2017 0832   LDLCALC 64 11/28/2017 0832   HgbA1C  Lab Results  Component Value Date   HGBA1C 5.8 (H) 11/28/2017    Urine Drug Screen:      Component Value Date/Time   LABOPIA NONE DETECTED 11/27/2017 1415   COCAINSCRNUR NONE DETECTED 11/27/2017 1415   LABBENZ NONE DETECTED 11/27/2017 1415   AMPHETMU POSITIVE (A) 11/27/2017 1415   THCU NONE DETECTED 11/27/2017 1415   LABBARB NONE DETECTED 11/27/2017 1415    Alcohol Level:  No results for input(s): ETH in the last 168 hours.    IMAGING  Ct Angio Head W Or Wo Contrast  IMPRESSION:  1. Positive for left MCA M1 large vessel occlusion, and also severe stenosis at the left ICA terminus, superimposed on the relatively large acute left hemisphere intra-axial hemorrhage (estimated blood volume 43 mL). Vasospasm suspected in the proximal ACAs. 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  2. Negative for intracranial  aneurysm, CTA spot sign, or evidence of vascular malformation in association with the acute hemorrhage. 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  3. Stable intracranial mass effect since 0938 hours today, with 16 mm of leftward midline shift and trapping of the right lateral ventricle.    Ct Head Wo Contrast  11/28/2017 IMPRESSION:  1. Stable when compared to  yesterday.  2. Left MCA territory infarct with evacuated hematoma. Midline shift measures 8 mm.  3. 2-3 mm subdural hematoma along the right cerebral convexity without interval increase.    Ct Head Wo Contrast  11/27/2017 IMPRESSION:  1. Interval LEFT frontal craniotomy for basal ganglia hematoma evacuation, small amount of residual blood products and regional edema. 7 mm residual LEFT to RIGHT midline shift, resolved RIGHT ventricular entrapment.  2. Trace RIGHT holo hemispheric and tentorial subdural hematomas. Global edema.    Ct Head Wo Contrast 11/27/2017 IMPRESSION:  Large area of hemorrhage in the left basal ganglia and left frontal lobe, 5.8 x 3.5 cm. 15 mm of left-to-right midline shift.   Mr Brain Wo Contrast 11/28/2017 IMPRESSION:  1. Postoperative changes from previous left frontal craniotomy for evacuation of left basal ganglia hematoma. Residual blood products within the evacuation cavity with persistent surrounding vasogenic edema and regional mass effect. Associated 8-9 mm of right-to-left midline shift with mild left uncal herniation similar to previous. No hydrocephalus or ventricular trapping.  2. Underlying evolving large acute ischemic left MCA territory infarct involving the majority of the left MCA territory.  3. Trace right cerebral and tentorial subdural hematoma without mass effect.  4. Right sphenoid sinusitis.  Ct Head Wo Contrast 11/29/2017 IMPRESSION:  1. Large left MCA infarct is stable in distribution.  2. Interval left frontal craniectomy with mild herniation of left frontal lobe through the defect.  3. Resolved uncal herniation, improved patency of left lateral ventricle, and 6 mm left-to-right midline shift, previously 8 mm.  4. Partial interval dispersion of acute hemorrhage in the left basal ganglia. Subcentimeter density anterior to the prior position of blood products possibly representing retracted clot or interval hemorrhage, attention at follow-up  recommended.    Ct Head Wo Contrast 12/02/2017 IMPRESSION:  1. Left MCA distribution infarction is stable in distribution. Mild increased edema of the infarct with increased herniation of the left frontal lobe via the left frontal craniectomy.  2. Stable 6 mm left-to-right midline shift and partial effacement of left lateral ventricle.  3. Stable small hematoma within the left basal ganglia.  4. No new acute intracranial abnormality identified.      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 15 mm of left-to-right midline shift.  MRI of the brain: Post craniotomy 8-9 mm of right-to-left midline shift with mild left uncal herniation similar to previous. Underlying evolving large acute ischemic left MCA territory infarct involving the majority of the left MCA territory.  Repeat Head CT post evacuation 9/20 - Left MCA territory infarct with evacuated hematoma. Midline shift measures  8 mm.   UDS - positive for amphetamines ( Pt takes Adderal at home)  2D Echocardiogram EF 60 to 65%  LE venous doppler negative for DVT  Hypercoagulable labs negative except high ESR and CRP  LDL 64  HgbA1c 5.8  VTE: Heparin subq  Antithrombotic: no antithrombotics secondary to [place], started ASA 81 12/04/17.   Therapy recommendations: signed off for now, re-consult later  Disposition: Pending placement in possible  LTAC   Cerebral edema  CT head midline shift 53m with uncal herniation  Repeat CT head s/p hematoma evacuation - midline shift  8736m S/p hemicrani 9/20  Repeat CT 9/21 - midline shift 36m4mOn keppra for seizure prevention  CT repeat 12/02/2017 stable midline shift, large left MCA infarct, residual hematoma  3% saline d/c'ed, now on NS  Na stable at 150   DC hypertonic saline 12/06/17 and check na daily - 143 today   Acute respiratory failure in the setting of massive IPH  Status post tracheostomy  On 12/09/17  CCM on board  Patient did well during the day on CPAP and early this a.m., per reports had increased work of breathing and was placed back on the vent continue on vent  Continue weaning trials  Patient to transfer to floor in the next 24 to 48 hours  Continue trach care per protocol  Intermittent chest x-rays  Chest x-ray done on 12/11/2017 showed no acute   cardiopulmonary abnormality  Seizure  Episode of stiffness, tongue biting  Overnight EEG no seizure  Increased keppra from 500 to 1000m49md  No more seizure since  Seizure precautions  Hypertension emergency  SBP stable :140s -160s  BP goal <160   On norvasc and lisinopril  On labetalol to 300mg87mimes daily  Clonidine 0.2 mg Q 6 hrs prn SBP > 160    Anemia - pt is in her period  Hb 11.8 on admission  Hemoglobin 8.4 -> 7.7-> 8.2->7.2->6.9->PRBC->8.3->8.6->8.1->7.7->8.2-->8.8  Received one unit PRBC   Continue to monitor  Iron study iron 36 and ferritin 66  Pt currently on  iron pills  PRBC transfusion if HGb < 7.0  Leukocytosis   WBC 21.5-16.8-> 14.4-> 13.3->13.4->13.5->13.8->13.7->14.3->13.9-->16.4  Afebrile  Continue monitoring  Nutrition  PEG tube placed on 12/09/2017 for tube feeding  Accu-Chek every 6 hours while on tube feeding   Other Stroke Risk Factors  ETOH use   Other Active Problems  ADHD - on Adderell at home  Anxiety  Depression  Hypokalemia  Seen by Dr AroraRory Percy after midnight on 9/28 - Na 149 -> 23.4% x 1 -> repeat Na 143 12/08/17 . CCM on board. Hypertonic saline dc'd  12/06/17. Check Na only once daily. 138 (10/3//2019)  Hospital day # 14  [id number] [place] CourtaJenetta Downeror 12/11/2017 11:33 AM I have personally examined this patient, reviewed notes, independently viewed imaging studies, participated in medical decision making and plan of care.ROS completed by me personally and pertinent positives fully documented  I have made any additions or clarifications directly to the above note. Agree with note above.Plan   wean off ventilator and keep in ICU to patient is 48 hours on trach collar prior to transfer him to the floor. Hopefully transfer to skilled nursing facility next week. No family at the bedside for discussion. Discussed with critical care nurse practitioner.This patient is critically ill and at significant risk of neurological worsening, death and care requires constant monitoring of vital signs, hemodynamics,respiratory and cardiac monitoring, extensive review of multiple databases,  frequent neurological assessment, discussion with family, other specialists and medical decision making of high complexity.I have made any additions or clarifications directly to the above note.This critical care time does not reflect procedure time, or teaching time or supervisory time of PA/NP/Med Resident etc but could involve care discussion time.  I spent 30 minutes of neurocritical care time  in the care of  this patient.      Antony Contras, MD Medical Director [facility] Pager: [phone] 12/11/2017 6:19 PM   To contact Stroke Continuity provider, please refer to [email or web address] After hours, contact General Neurology

## 2017-12-11 NOTE — Progress Notes (Signed)
NAME:  Tammie Miles, MRN:  [id number], DOB:  Jul 19, 1975, LOS: 14 ADMISSION DATE:  11/27/2017, CONSULTATION DATE:  11/27/2017 REFERRING MD:  Dr. Conchita Paris CHIEF COMPLAINT:  Left frontal hemorrhage  Brief History   3 yoF admitted 9/19 with AMS that began the evening of 9/18, initially thought due to intoxication. Remained unresponsive through am of 9/19.  Patient was brought to the ED where a CT was done and showed a large left sided ICH. Patient was taken to the OR and a mini craniectomy was done.  Patient was brought out to the ICU and PCCM was consulted for vent management. Course complicated by cerebral edema requiring 3% saline limited by hypernatremia. Also complicated by encephalopathy. Tolerating vent wean, but mental status limited extubation. Then 9/25 she developed what appeared to be seizures with gaze deviation and tongue biting. EEG done overnight with results pending and AEDs adjusted. No further seizure seen.   Significant Hospital Events   9/19 Admit/ OR- mini crani 9/20 Decompressive craniectomy ( L)  with skull harvesting in the abdomen 9/21 hypertonic saline started 9/23 hypertonic stopped.  9/25 Seizure. EEG done with no sz activity. AEDs adjusted. 10/1 Trach / PEG 10/2 Trach collar  10/3  Trach collar  Consults: date of consult/date signed off & final recs  9/19 NSGY 9/19 PCCM   Procedures (surgical and bedside):  9/19 Intubated 9/19 OR for crani, evac of hematoma>> no change in neuro status post op 9/20 Decompressive craniectomy ( L)  with skull harvesting in the abdomen  Significant Diagnostic Tests:  9/19 [facility]  Large area of hemorrhage in the left basal ganglia and left frontal lobe, 5.8 x 3.5 cm. 15 mm of left-to-right midline shift. 9/19 CTA head: Positive for left MCA M1 large vessel occlusion, and also severe stenosis at the left ICA terminus, superimposed on the relatively large acute left hemisphere intra-axial hemorrhage (estimated blood volume 43 mL).  Vasospasm suspected in the proximal ACAs. 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 2. Negative for intracranial aneurysm, CTA spot sign, or evidence of vascular malformation in association with the acute hemorrhage. 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 3. Stable intracranial mass effect since 0938 hours today, with 16 mm of leftward midline shift and trapping of the right lateral ventricle. 11/29/2017 CT Head Large left MCA infarct is stable in distribution. Interval left frontal craniectomy with mild herniation of left frontal lobe through the defect. Resolved uncal herniation, improved patency of left lateral ventricle, and 6 mm left-to-right midline shift, previously 8 mm. Partial interval dispersion of acute hemorrhage in the left basal ganglia. Subcentimeter density anterior to the prior position of blood products possibly representing retracted clot or interval hemorrhage, attention at follow-up recommended. CT 9/24: 1. Left MCA distribution infarction is stable in distribution. Mild increased edema of the infarct with increased herniation of the left frontal lobe via the left frontal craniectomy. 2. Stable 6 mm left-to-right midline shift and partial effacement of left lateral ventricle. 3. Stable small hematoma within the left basal ganglia. 4. No new acute intracranial abnormality identified.  Micro Data: 9/19 MRSA PCR >> Negative 9/19 Sputum Culture >> normal flora 9/22 Blood x 2 >> negative 9/22 UA >> Normal 9/22 Sputum >> few candida dubliniensis  Antimicrobials:  9/19 Cefazolin (pre-op) 9/20  Subjective:  RT reports pt initially did not tolerate ATC, 2nd attempt  she is tolerating.  Tmax 101.2.  Net neg I/O.     Objective   Blood pressure (!) 155/89, pulse 94, temperature (!) 100.4 F (38  C), temperature source Axillary, resp. rate (!) 24, height 5\' 1"  (1.549 m), weight 74.6 kg, SpO2 100 %.    Vent Mode: PCV FiO2 (%):  [28 %-30 %] 28 % Set Rate:  [16 bmp] 16 bmp PEEP:  [5 cmH20] 5 cmH20 Plateau Pressure:  [15 cmH20] 15 cmH20   Intake/Output Summary (Last 24 hours) at 12/11/2017 6213 Last data filed at 12/11/2017 0700 Gross per 24 hour  Intake 2062.7 ml  Output 2100 ml  Net -37.3 ml   Filed Weights   12/09/17 0339 12/10/17 0500 12/11/17 0423  Weight: 77.5 kg 77.5 kg 74.6 kg    Examination:  General: adult female in NAD lying in bed HEENT: MM pink/moist, Trach midline c/d/i  Neuro: occasionally will open eyes to voice, does not do to command, spontaneous movement of right side with stimulation, withdrawal x3 except RUE (unchanged) CV: s1s2 rrr, no m/r/g PULM: even/non-labored, lungs bilaterally clear  GI: soft, non-tender, bsx4 active, PEG c/d/i, abd binder in place  Extremities: warm/dry, trace generalized edema  Skin: no rashes or lesions  Resolved Hospital Problem list     Assessment & Plan:   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 Now status post hemicraniotomy for evacuation. Complicated by cerebral edema requiring hypertonic saline, which has since been stopped. She remains slow to improve.  P:  SBP goal <180 Per NSGY / Neuro  Minimize sedating medications  PT efforts   Hypertensive Crisis - improved Hx HTN P:  Monitor in ICU  Continue norvasc, labetalol, lisinopril, HCTZ,  Minoxidil  SBP goal as above   Acute respiratory insufficiency in the setting of massive IPH P: PSV as tolerated, goal ATC Trach care per protocol  Clip trach sutures 10/9 Follow intermittent CXR > images reviewed 10/3, no acute infiltrate, good aeration   Fever- improved Leukocytosis -stable - likely neuro driven. PCT 0.13, remains reassuring. Sputum culture with few candida.  P: Monitor off abx  PRN tylenol    Hypokalemia Hypophos Hyperchloremia P: Trend BMP / urinary output Replace electrolytes as indicated Avoid nephrotoxic agents, ensure adequate renal perfusion  Anemia - Transfused one unit PRBC 9/25 for Hgb 6.9. Only evidence of bleeding is menses.  P: Trend CBC SCD's for DVT prophylaxis   Protein calorie malnutrition P: TF per Nutrition  Follow CBG's    Possible ETOH abuse - unclear usage P: Continue thiamine, folate   Hx ADHD/ depression / anxiety P:  Supportive care Hold adderall indefinitely   Disposition / Summary of Today's Plan 12/11/17   ATC as tolerated.  Goal trach collar 24/7.  Primary SVC starting SNF/ rehab search.      Diet: TF  Pain/Anxiety/Delirium protocol: PRN low dose fentanyl  DVT prophylaxis: SCD's GI prophylaxis: pepcid  Hyperglycemia protocol: SSI Mobility: BR Code Status: full Family Communication: 9/30 spoke with son, reviewed patients overall state of health.  He is very articulate in his understanding of her illness.   He requests that we continue full support / proceed with trach as he wishes to give his little sister as much time with her mother as possible (she is 7).   No family at bedside am 10/3.  Labs   CBC: Recent Labs  Lab 12/07/17 0322 12/08/17 0506 12/09/17 0332 12/10/17 0319 12/11/17 0230  WBC 14.3* 13.9* 15.0*  14.5* 16.4*  NEUTROABS 10.0* 10.0*  --   --   --   HGB 7.7* 8.2* 8.1* 9.6* 8.8*  HCT 26.0* 27.3* 26.6* 30.9* 28.4*  MCV 83.9 83.5 82.6 81.7 81.6  PLT 240 280 319 284 326   Basic Metabolic Panel: Recent Labs  Lab 12/05/17 0522  12/06/17 0456 12/07/17 0322 12/08/17 0506 12/09/17 0332 12/10/17 0319 12/10/17 1025  NA 153*   < > 150* 147* 143 143 137 139  K 4.0  --  3.6 3.5 3.1* 4.1 4.1 3.3*  CL 119*  --  120* 115* 112* 107 102 103  CO2 26  --  24 24 23 23 23 23   GLUCOSE 123*  --  124* 120* 127* 88 96 136*  BUN 27*  --  23* 23* 19 23* 20 18  CREATININE 0.77  --  0.60 0.61 0.52 0.64 0.68 0.68   CALCIUM 8.7*  --  8.7* 8.7* 8.5* 9.5 9.2 9.0  MG 2.4  --  2.4 2.2 2.0 2.2  --   --   PHOS 4.1  --  3.3  --   --   --   --   --    < > = values in this interval not displayed.   GFR: Estimated Creatinine Clearance: 84.6 mL/min (by C-G formula based on SCr of 0.68 mg/dL). Recent Labs  Lab 12/08/17 0506 12/09/17 0332 12/10/17 0319 12/11/17 0230  WBC 13.9* 15.0* 14.5* 16.4*   Liver Function Tests: Recent Labs  Lab 12/05/17 0522 12/07/17 0322 12/08/17 0506  AST  --  31 29  ALT  --  36 32  ALKPHOS  --  178* 160*  BILITOT  --  0.4 0.5  PROT  --  6.1* 6.4*  ALBUMIN 2.1* 2.1* 2.1*   No results for input(s): LIPASE, AMYLASE in the last 168 hours. No results for input(s): AMMONIA in the last 168 hours. ABG    Component Value Date/Time   PHART 7.444 12/08/2017 0414   PCO2ART 36.7 12/08/2017 0414   PO2ART 109 (H) 12/08/2017 0414   HCO3 24.6 12/08/2017 0414   TCO2 27 12/05/2017 1216   ACIDBASEDEF 6.0 (H) 11/29/2017 0310   O2SAT 98.2 12/08/2017 0414    Coagulation Profile: Recent Labs  Lab 12/07/17 0322 12/08/17 0506 12/09/17 0332  INR 1.13 1.09 1.13   Cardiac Enzymes: No results for input(s): CKTOTAL, CKMB, CKMBINDEX, TROPONINI in the last 168 hours. HbA1C: Hgb A1c MFr Bld  Date/Time Value Ref Range Status  11/28/2017 08:32 AM 5.8 (H) 4.8 - 5.6 % Final    Comment:    (NOTE) Pre diabetes:          5.7%-6.4% Diabetes:              >6.4% Glycemic control for   <7.0% adults with diabetes    CBG: Recent Labs  Lab 12/10/17 1533 12/10/17 1910 12/10/17 2339 12/11/17 0328 12/11/17 0812  GLUCAP 126* 98 106* 85 95    Canary Brim, NP-C [facility] Pgr: [phone] or if no answer [phone] 12/11/2017, 9:09 AM

## 2017-12-11 NOTE — Progress Notes (Signed)
Placed patient on full support ventilation due to increased HR, RR, and increased WOB.  Attempted PSV mode, however patient still appears to have increased WOB. Changed to full support mode.

## 2017-12-11 NOTE — Progress Notes (Signed)
CSW continues to follow   Antony Blackbird, [facility] Clinical Social Worker [phone]

## 2017-12-12 LAB — GLUCOSE, CAPILLARY
GLUCOSE-CAPILLARY: 102 mg/dL — AB (ref 70–99)
GLUCOSE-CAPILLARY: 135 mg/dL — AB (ref 70–99)
GLUCOSE-CAPILLARY: 86 mg/dL (ref 70–99)
GLUCOSE-CAPILLARY: 93 mg/dL (ref 70–99)
Glucose-Capillary: 88 mg/dL (ref 70–99)

## 2017-12-12 LAB — BASIC METABOLIC PANEL
Anion gap: 11 (ref 5–15)
BUN: 20 mg/dL (ref 6–20)
CALCIUM: 9 mg/dL (ref 8.9–10.3)
CHLORIDE: 106 mmol/L (ref 98–111)
CO2: 24 mmol/L (ref 22–32)
Creatinine, Ser: 0.66 mg/dL (ref 0.44–1.00)
Glucose, Bld: 104 mg/dL — ABNORMAL HIGH (ref 70–99)
Potassium: 3.7 mmol/L (ref 3.5–5.1)
SODIUM: 141 mmol/L (ref 135–145)

## 2017-12-12 LAB — CBC
HCT: 28 % — ABNORMAL LOW (ref 36.0–46.0)
HEMOGLOBIN: 8.5 g/dL — AB (ref 12.0–15.0)
MCH: 24.9 pg — ABNORMAL LOW (ref 26.0–34.0)
MCHC: 30.4 g/dL (ref 30.0–36.0)
MCV: 81.9 fL (ref 78.0–100.0)
PLATELETS: 360 10*3/uL (ref 150–400)
RBC: 3.42 MIL/uL — ABNORMAL LOW (ref 3.87–5.11)
RDW: 21.2 % — ABNORMAL HIGH (ref 11.5–15.5)
WBC: 13.5 10*3/uL — ABNORMAL HIGH (ref 4.0–10.5)

## 2017-12-12 MED ORDER — JEVITY 1.2 CAL PO LIQD
360.0000 mL | Freq: Four times a day (QID) | ORAL | Status: DC
  Administered 2017-12-12 – 2017-12-14 (×9): 360 mL
  Administered 2017-12-14: 08:00:00
  Administered 2017-12-15 – 2017-12-28 (×54): 360 mL
  Administered 2017-12-28: 474 mL
  Administered 2017-12-29 – 2018-01-01 (×14): 360 mL
  Filled 2017-12-12 (×85): qty 474

## 2017-12-12 MED ORDER — PSYLLIUM 95 % PO PACK
1.0000 | PACK | Freq: Two times a day (BID) | ORAL | Status: DC
  Administered 2017-12-12 – 2018-01-08 (×50): 1 via ORAL
  Filled 2017-12-12 (×57): qty 1

## 2017-12-12 MED ORDER — FREE WATER
140.0000 mL | Freq: Four times a day (QID) | Status: DC
  Administered 2017-12-12 – 2018-01-01 (×77): 140 mL

## 2017-12-12 MED ORDER — VITAL AF 1.2 CAL PO LIQD
237.0000 mL | Freq: Three times a day (TID) | ORAL | Status: DC
  Administered 2017-12-12: 237 mL
  Filled 2017-12-12 (×4): qty 237

## 2017-12-12 NOTE — Progress Notes (Signed)
Pt placed back on full support due to increased RR and increased WOB. Pt on previous settings. PC 15 RR:16 Fio2: 30% and PEEP of .  Pt tolerating well.  Will continue to monitor.

## 2017-12-12 NOTE — Progress Notes (Signed)
Nutrition Follow-up  DOCUMENTATION CODES:   Obesity unspecified  INTERVENTION:   360 ml Jevity 1.2 QID 70 ml free water before and after each bolus feeding  Provides: 1710 kcal, 79 grams protein, and 1146 ml free water Total free water: 1706 ml   NUTRITION DIAGNOSIS:   Inadequate oral intake related to inability to eat as evidenced by NPO status. Ongoing.   GOAL:   Patient will meet greater than or equal to 90% of their needs Met.   MONITOR:   TF tolerance, I & O's  ASSESSMENT:   Pt with PMH of HTN with prescribed stimulant use admitted with large L hemorrhagic basal ganglia with 15 mm shift with early herniation s/p L frontotemporal craniotomy for evacuation of hematoma.   9/19 intubated, OR for crani evacuation 9/20 decompressive L craniectomy 10/1 trach, PEG, per MD give time for neuro to recover 10/4 transitioned to TC, not yet been on TC for > 24  hrs  Medications reviewed and include: colace BID, ferrous sulfate BID, folic acid, MVI, senokot-s daily, thiamine, metamucil BID Labs reviewed   I/O: +1950 ml since admit UOP: -3593 ml x 24 hrs   Diet Order:   Diet Order    None      EDUCATION NEEDS:   No education needs have been identified at this time  Skin:  Skin Assessment: Reviewed RN Assessment(L head incision)  Last BM:  100 ml today via rectal tube  Height:   Ht Readings from Last 1 Encounters:  12/01/17 5' 1"  (1.549 m)    Weight:   Wt Readings from Last 1 Encounters:  12/12/17 71.7 kg    Ideal Body Weight:  47.7 kg  BMI:  Body mass index is 29.87 kg/m.  Estimated Nutritional Needs:   Kcal:  1600-1800  Protein:  80-100 grams  Fluid:  > 1.6 L/day  Maylon Peppers RD, LDN, CNSC [phone] Pager [phone] After Hours Pager

## 2017-12-12 NOTE — Progress Notes (Signed)
RN walked into room and noticed pt was diaphoretic, skin pale, labored breathing with RR in 40s and desating down to 70% while on TC (5L/28%). A few minutes prior RN had just in line suctioned with no secretions removed. RN attempted to in line suction again but met resistance and could not advance the catheter. Pt then began to desat down to 35%. RN applied bag mask valve and sats started climbing back up to 100%. SBP greater than 200. Pt continues to have labored breathing. CCM MD notified of situation and no new orders were given at this time, pt will remain on TC. RN will continue to monitor and follow closely.

## 2017-12-12 NOTE — Progress Notes (Signed)
RT called to pt's room.  RN stated pt SATS were in the 40's.  RN bagging pt when two RT's enter pt's room.  SATS increase to upper 90's after RN bagged pt.  RT changed inner cannula and suction pt.  A moderate amount of fresh pink secretions was suction from pt.  Dr. Denese Killings was page. Dr. Denese Killings doesn't want pt back on full support on vent at this time.  RT will continue to monitor pt.

## 2017-12-12 NOTE — Progress Notes (Signed)
NAME:  Tammie Miles, MRN:  [id number], DOB:  1975-03-20, LOS: 15 ADMISSION DATE:  11/27/2017, CONSULTATION DATE:  11/27/2017 REFERRING MD:  Dr. Conchita Paris CHIEF COMPLAINT:  Left frontal hemorrhage  Brief History   59 yoF admitted 9/19 with AMS that began the evening of 9/18, initially thought due to intoxication. Remained unresponsive through am of 9/19.  Patient was brought to the ED where a CT was done and showed a large left sided ICH. Patient was taken to the OR and a mini craniectomy was done.  Patient was brought out to the ICU and PCCM was consulted for vent management. Course complicated by cerebral edema requiring 3% saline limited by hypernatremia. Also complicated by encephalopathy. Tolerating vent wean, but mental status limited extubation. Then 9/25 she developed what appeared to be seizures with gaze deviation and tongue biting. EEG done overnight with results pending and AEDs adjusted. No further seizure seen.   Significant Hospital Events   9/19 Admit/ OR- mini crani 9/20 Decompressive craniectomy ( L)  with skull harvesting in the abdomen 9/21 hypertonic saline started 9/23 hypertonic stopped.  9/25 Seizure. EEG done with no sz activity. AEDs adjusted. 10/1 Trach / PEG 10/2 Trach collar  10/3  Trach collar  Consults: date of consult/date signed off & final recs  9/19 NSGY 9/19 PCCM   Procedures (surgical and bedside):  9/19 Intubated 9/19 OR for crani, evac of hematoma>> no change in neuro status post op 9/20 Decompressive craniectomy ( L)  with skull harvesting in the abdomen  Significant Diagnostic Tests:  9/19 [facility]  Large area of hemorrhage in the left basal ganglia and left frontal lobe, 5.8 x 3.5 cm. 15 mm of left-to-right midline shift. 9/19 CTA head: Positive for left MCA M1 large vessel occlusion, and also severe stenosis at the left ICA terminus, superimposed on the relatively large acute left hemisphere intra-axial hemorrhage (estimated blood volume 43 mL).  Vasospasm suspected in the proximal ACAs. 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 2. Negative for intracranial aneurysm, CTA spot sign, or evidence of vascular malformation in association with the acute hemorrhage. 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 3. Stable intracranial mass effect since 0938 hours today, with 16 mm of leftward midline shift and trapping of the right lateral ventricle. 11/29/2017 CT Head Large left MCA infarct is stable in distribution. Interval left frontal craniectomy with mild herniation of left frontal lobe through the defect. Resolved uncal herniation, improved patency of left lateral ventricle, and 6 mm left-to-right midline shift, previously 8 mm. Partial interval dispersion of acute hemorrhage in the left basal ganglia. Subcentimeter density anterior to the prior position of blood products possibly representing retracted clot or interval hemorrhage, attention at follow-up recommended. CT 9/24: 1. Left MCA distribution infarction is stable in distribution. Mild increased edema of the infarct with increased herniation of the left frontal lobe via the left frontal craniectomy. 2. Stable 6 mm left-to-right midline shift and partial effacement of left lateral ventricle. 3. Stable small hematoma within the left basal ganglia. 4. No new acute intracranial abnormality identified.  Micro Data: 9/19 MRSA PCR >> Negative 9/19 Sputum Culture >> normal flora 9/22 Blood x 2 >> negative 9/22 UA >> Normal 9/22 Sputum >> few candida dubliniensis  Antimicrobials:  9/19 Cefazolin (pre-op) 9/20  Subjective:  Placed on ventilator at 5am for increased respiratory rate, but  resumed trach collar trials as of 7am this morning.  Objective   Blood pressure (!) 155/90, pulse 100, temperature (!)  100.8 F (38.2 C), temperature source Rectal, resp. rate (!) 23, height 5\' 1"  (1.549 m), weight 71.7 kg, SpO2 100 %.    Vent Mode: PCV FiO2 (%):  [28 %-30 %] 28 % Set Rate:  [16 bmp] 16 bmp PEEP:  [5 cmH20] 5 cmH20 Plateau Pressure:  [12 cmH20] 12 cmH20   Intake/Output Summary (Last 24 hours) at 12/12/2017 1041 Last data filed at 12/12/2017 0500 Gross per 24 hour  Intake 1491.03 ml  Output 1550 ml  Net -58.97 ml   Filed Weights   12/10/17 0500 12/11/17 0423 12/12/17 0500  Weight: 77.5 kg 74.6 kg 71.7 kg    Examination:  General: adult female in NAD lying in bed HEENT:  Trach midline with no skin breakdown. Neuro:  open eyes to voice, does not do to command, spontaneous movement of right side with stimulation, withdrawal x3 except RUE (unchanged) CV: warm and well perfused with normal heart sounds. PULM: even/non-labored, lungs bilaterally clear on trach collar. 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 Now status post hemicraniotomy for evacuation. Complicated by cerebral edema requiring hypertonic saline, which has since been stopped. She remains slow to improve.  Anticipate slow recovery. Mobilize to chair.  Continue amantidine to enhance neurological recovery.  Severe hypertension with adequate control at present.  Continue norvasc, labetalol, lisinopril, HCTZ,  Minoxidil  SBP goal as above   Acute respiratory insufficiency in the setting of massive IPH Now for placement based on ability to stay off ventilator at nighttime. Open-ended trach collar trial.  Central fever - improving. Cultures are negative.  Leukocytosis -stable - likely neuro driven. PCT 0.13, remains reassuring. Sputum culture with few candida.  Stop  cooling blanket and continue bromocriptine for central fever.  Anemia of acute illness. Continue with conservative transfusion strategy.  Protein calorie malnutrition with tube feed diarrhea. Switch to bolus feedings and add fiber.   Possible ETOH abuse - unclear usage Continue thiamine, folate   Hx ADHD/ depression / anxiety Will consider restarting Adderall which may help with level of consciousness.  Disposition / Summary of Today's Plan 12/12/17   Open ended tracheostomy collar trial.    Diet: TF  Pain/Anxiety/Delirium protocol: PRN low dose fentanyl  DVT prophylaxis: SCD's GI prophylaxis: pepcid  Hyperglycemia protocol: SSI Mobility: BR Code Status: full Family Communication: 9/30 spoke with son, reviewed patients overall state of health.  He is very articulate in his understanding of her illness.   He requests that we continue full support / proceed with trach as he wishes to give his little sister as much time with her mother as possible (she is 7).   No family at bedside am 10/3.  Labs   CBC: Recent Labs  Lab 12/07/17 0322 12/08/17 0506 12/09/17 0332 12/10/17 0319 12/11/17 0230 12/12/17 0339  WBC 14.3* 13.9* 15.0* 14.5* 16.4* 13.5*  NEUTROABS 10.0* 10.0*  --   --   --   --   HGB 7.7* 8.2* 8.1* 9.6* 8.8* 8.5*  HCT 26.0* 27.3* 26.6* 30.9* 28.4* 28.0*  MCV 83.9 83.5 82.6 81.7 81.6 81.9  PLT 240 280 319 284 326 360  Basic Metabolic Panel: Recent Labs  Lab 12/06/17 0456 12/07/17 0322 12/08/17 0506 12/09/17 0332 12/10/17 0319 12/10/17 1025 12/11/17 0911 12/12/17 0339  NA 150* 147* 143 143 137 139 138 141  K 3.6 3.5 3.1* 4.1 4.1 3.3* 3.8 3.7  CL 120* 115* 112* 107 102 103 103 106  CO2 24 24 23 23 23 23 22 24   GLUCOSE 124* 120* 127* 88 96 136* 103* 104*  BUN 23* 23* 19 23* 20 18 16 20   CREATININE 0.60 0.61 0.52 0.64 0.68 0.68 0.62 0.66  CALCIUM 8.7* 8.7* 8.5* 9.5 9.2 9.0 8.9 9.0  MG 2.4 2.2 2.0 2.2  --   --   --   --   PHOS 3.3  --   --   --   --   --    --   --    GFR: Estimated Creatinine Clearance: 83 mL/min (by C-G formula based on SCr of 0.66 mg/dL). Recent Labs  Lab 12/09/17 0332 12/10/17 0319 12/11/17 0230 12/12/17 0339  WBC 15.0* 14.5* 16.4* 13.5*   Liver Function Tests: Recent Labs  Lab 12/07/17 0322 12/08/17 0506  AST 31 29  ALT 36 32  ALKPHOS 178* 160*  BILITOT 0.4 0.5  PROT 6.1* 6.4*  ALBUMIN 2.1* 2.1*   No results for input(s): LIPASE, AMYLASE in the last 168 hours. No results for input(s): AMMONIA in the last 168 hours. ABG    Component Value Date/Time   PHART 7.444 12/08/2017 0414   PCO2ART 36.7 12/08/2017 0414   PO2ART 109 (H) 12/08/2017 0414   HCO3 24.6 12/08/2017 0414   TCO2 27 12/05/2017 1216   ACIDBASEDEF 6.0 (H) 11/29/2017 0310   O2SAT 98.2 12/08/2017 0414    Coagulation Profile: Recent Labs  Lab 12/07/17 0322 12/08/17 0506 12/09/17 0332  INR 1.13 1.09 1.13   Cardiac Enzymes: No results for input(s): CKTOTAL, CKMB, CKMBINDEX, TROPONINI in the last 168 hours. HbA1C: Hgb A1c MFr Bld  Date/Time Value Ref Range Status  11/28/2017 08:32 AM 5.8 (H) 4.8 - 5.6 % Final    Comment:    (NOTE) Pre diabetes:          5.7%-6.4% Diabetes:              >6.4% Glycemic control for   <7.0% adults with diabetes    CBG: Recent Labs  Lab 12/11/17 1159 12/11/17 1914 12/11/17 2323 12/12/17 0347 12/12/17 0738  GLUCAP 109* 98 130* 86 88    Lynnell Catalan, MD [facility] ICU Physician [facility]  Pager: [phone] Mobile: [phone] After hours: [phone].  12/12/2017, 10:41 AM

## 2017-12-12 NOTE — Progress Notes (Addendum)
NEUROHOSPITALISTS STROKE TEAM - DAILY PROGRESS NOTE   SUBJECTIVE No family is at bedside.   Patient with trach collar in place on ventilator, non-labored breathing, night due to agitation, and increased work of breathing and elevated heart rate she was placed back on full support ventilator.  Mental status unchanged from previous.  Will discuss with case management disposition as patient will likely require dc to LTAC  For continued treatment.   OBJECTIVE Most recent Vital Signs: Vitals:   12/12/17 1100 12/12/17 1110 12/12/17 1200 12/12/17 1300  BP: (!) 155/87 (!) 155/87 120/90 119/67  Pulse: 97 (!) 102 99 96  Resp: (!) 26 (!) 22 (!) 23 (!) 26  Temp:   100.1 F (37.8 C)   TempSrc:   Core (Comment)   SpO2: 100% 100% 100% 100%  Weight:      Height:       CBG (last 3)  Recent Labs    12/12/17 0347 12/12/17 0738 12/12/17 1111  GLUCAP 86 88 102*    Physical Exam  Temp:  [100.1 F (37.8 C)-100.8 F (38.2 C)] 100.1 F (37.8 C) (10/04 1200) Pulse Rate:  [92-110] 96 (10/04 1300) Resp:  [21-31] 26 (10/04 1300) BP: (116-179)/(67-98) 119/67 (10/04 1300) SpO2:  [99 %-100 %] 100 % (10/04 1300) FiO2 (%):  [28 %-30 %] 28 % (10/04 1200) Weight:  [71.7 kg] 71.7 kg (10/04 0500)   HEENT-status post left frontoparietal craniotomy with wound clean dry and stitches intact and status post tracheostomy on 11/28/17  cardiovascular- S1-S2 audible, pulses palpable throughout   Lungs- low breath sounds bilaterally, Status post tracheostomy on vent with nonlabored breathing Abdomen-hypoactive bowel sounds  musculoskeletal-no joint tenderness, deformity or swelling Skin-warm and dry Neurological Exam :  Patient is awake, aphasic, and does not respond to commands.  Does to open eyes on voice with bilateral roving eye movement,  b/l eyelid swollen, R>L but improving, PERRL, eye left gaze preference but able to cross midline. Not blinking to visual  threat consistently, not tracking, but does move pupils spontaneously. Left corneal positive, and right absent. Gag positive.  No facial asymmetry noted.  LUE localized to pain with effort  against gravity. LLE withdraw to pain 2+/5. RUE flaccid with increased tone, no response to stimuli and RLE mild withdraw to pain. B/l babinski positive. Sensation, coordination and gait not tested.    Lipid Panel    Component Value Date/Time   CHOL 138 11/28/2017 0832   TRIG 414 (H) 11/30/2017 1658   HDL 42 11/28/2017 0832   CHOLHDL 3.3 11/28/2017 0832   VLDL 32 11/28/2017 0832   LDLCALC 64 11/28/2017 0832   HgbA1C  Lab Results  Component Value Date   HGBA1C 5.8 (H) 11/28/2017    Urine Drug Screen:      Component Value Date/Time   LABOPIA NONE DETECTED 11/27/2017 1415   COCAINSCRNUR NONE DETECTED 11/27/2017 1415   LABBENZ NONE DETECTED 11/27/2017 1415   AMPHETMU POSITIVE (A) 11/27/2017 1415   THCU NONE DETECTED 11/27/2017 1415   LABBARB NONE DETECTED 11/27/2017 1415    Alcohol Level:  No results for input(s): ETH in the last 168 hours.    IMAGING  Ct Angio Head W Or Wo Contrast  IMPRESSION:  1. Positive for left MCA M1 large vessel occlusion, and also severe stenosis at the left ICA terminus, superimposed on the relatively large acute left hemisphere intra-axial hemorrhage (estimated blood volume 43 mL). Vasospasm suspected in the proximal ACAs. This constellation might indicate an acute large vessel  Left MCA infarct with malignant hemorrhagic transformation.  2. Negative for intracranial aneurysm, CTA spot sign, or evidence of vascular malformation in association with the acute hemorrhage. 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  3. Stable intracranial mass effect since 0938 hours today, with 16 mm of leftward midline shift and trapping of the right lateral  ventricle.    Ct Head Wo Contrast  11/28/2017 IMPRESSION:  1. Stable when compared to yesterday.  2. Left MCA territory infarct with evacuated hematoma. Midline shift measures 8 mm.  3. 2-3 mm subdural hematoma along the right cerebral convexity without interval increase.    Ct Head Wo Contrast  11/27/2017 IMPRESSION:  1. Interval LEFT frontal craniotomy for basal ganglia hematoma evacuation, small amount of residual blood products and regional edema. 7 mm residual LEFT to RIGHT midline shift, resolved RIGHT ventricular entrapment.  2. Trace RIGHT holo hemispheric and tentorial subdural hematomas. Global edema.    Ct Head Wo Contrast 11/27/2017 IMPRESSION:  Large area of hemorrhage in the left basal ganglia and left frontal lobe, 5.8 x 3.5 cm. 15 mm of left-to-right midline shift.   Mr Brain Wo Contrast 11/28/2017 IMPRESSION:  1. Postoperative changes from previous left frontal craniotomy for evacuation of left basal ganglia hematoma. Residual blood products within the evacuation cavity with persistent surrounding vasogenic edema and regional mass effect. Associated 8-9 mm of right-to-left midline shift with mild left uncal herniation similar to previous. No hydrocephalus or ventricular trapping.  2. Underlying evolving large acute ischemic left MCA territory infarct involving the majority of the left MCA territory.  3. Trace right cerebral and tentorial subdural hematoma without mass effect.  4. Right sphenoid sinusitis.  Ct Head Wo Contrast 11/29/2017 IMPRESSION:  1. Large left MCA infarct is stable in distribution.  2. Interval left frontal craniectomy with mild herniation of left frontal lobe through the defect.  3. Resolved uncal herniation, improved patency of left lateral ventricle, and 6 mm left-to-right midline shift, previously 8 mm.  4. Partial interval dispersion of acute hemorrhage in the left basal ganglia. Subcentimeter density anterior to the prior position of blood  products possibly representing retracted clot or interval hemorrhage, attention at follow-up recommended.    Ct Head Wo Contrast 12/02/2017 IMPRESSION:  1. Left MCA distribution infarction is stable in distribution. Mild increased edema of the infarct with increased herniation of the left frontal lobe via the left frontal craniectomy.  2. Stable 6 mm left-to-right midline shift and partial effacement of left lateral ventricle.  3. Stable small hematoma within the left basal ganglia.  4. No new acute intracranial abnormality identified.      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 15 mm of left-to-right midline shift.  MRI of the brain: Post craniotomy 8-9 mm of right-to-left midline shift with mild left uncal herniation similar to previous. Underlying evolving large acute ischemic left MCA territory infarct involving the majority of the left MCA territory.  Repeat Head CT post evacuation  9/20 - Left MCA territory infarct with evacuated hematoma. Midline shift measures 8 mm.   UDS - positive for amphetamines ( Pt takes Adderal at home)  2D Echocardiogram EF 60 to 65%  LE venous doppler negative for DVT  Hypercoagulable labs negative except high ESR and CRP  LDL 64  HgbA1c 5.8  VTE: Heparin subq  Antithrombotic: no antithrombotics secondary to [place], started ASA 81 12/04/17.   Therapy recommendations: signed off for now, re-consult later  Disposition: Pending placement in possible  LTAC  (called social worker for updates  currently awaiting callback)   Cerebral edema  CT head midline shift 25m with uncal herniation  Repeat CT head s/p hematoma evacuation - midline shift 8570m S/p hemicrani 9/20  Repeat CT 9/21 - midline shift 70m59mOn keppra for seizure prevention  CT repeat 12/02/2017 stable midline shift, large left MCA infarct, residual hematoma  3% saline d/c'ed, now on NS  Na stable at 150   DC hypertonic saline 12/06/17 and check na daily - 143 today   Acute respiratory failure in the setting of massive IPH  Status post tracheostomy  On 12/09/17  CCM on board  Patient did well during the day on CPAP and early this a.m., per reports had increased work of breathing and was placed back on the vent continue on vent  Continue weaning trials  Patient to transfer to floor in the next 24 to 48 hours  Continue trach care per protocol  Intermittent chest x-rays  Chest x-ray done on 12/11/2017 showed no acute   cardiopulmonary abnormality  Seizure  Episode of stiffness, tongue biting  Overnight EEG no seizure  Increased keppra from 500 to 1000m5md  No more seizure since  Seizure precautions  Hypertension emergency  SBP stable :140s -160s  BP goal <160   On norvasc and lisinopril  On labetalol to 300mg100mimes daily  Clonidine 0.2 mg Q 6 hrs prn SBP > 160    Anemia - pt is in her period  Hb 11.8 on admission  Hemoglobin 8.4 -> 7.7-> 8.2->7.2->6.9->PRBC->8.3->8.6->8.1->7.7->8.2-->8.8  Received one unit PRBC   Continue to monitor  Iron study iron 36 and ferritin 66  Pt currently on  iron pills  PRBC transfusion if HGb < 7.0  Leukocytosis   WBC 21.5-16.8-> 14.4-> 13.3->13.4->13.5->13.8->13.7->14.3->13.9-->16.4-->13.5  Afebrile  Continue monitoring  Nutrition  PEG tube placed on 12/09/2017 for tube feeding  Accu-Chek every 6 hours while on tube feeding   Other Stroke Risk Factors  ETOH use   Other Active Problems  ADHD - on Adderell at  home  Anxiety  Depression  Hypokalemia  Seen by Dr AroraRory Percy after midnight on 9/28 - Na 149 -> 23.4% x 1 -> repeat Na 143 12/08/17 . CCM on board. Hypertonic saline dc'd 12/06/17. Check Na only once daily. 141 (12/12/2017)  Hospital day # 15  [id number]el December/2019 1:57 PM I have personally examined this patient, reviewed notes, independently viewed imaging studies, participated in medical decision making and plan of care.ROS completed by me personally and pertinent positives fully documented  I have made any additions or clarifications directly to the above note. Agree with note above. Case discussed with Dr. Ravi Kipp Broodical care medicine. Consult case manager for LTAC bed but hopefully we can have her on trach collar for 48 hours prior to moving to the floor and given had regular nursing homes. This patient is critically ill and at significant risk of neurological worsening, death and  care requires constant monitoring of vital signs, hemodynamics,respiratory and cardiac monitoring, extensive review of multiple databases, frequent neurological assessment, discussion with family, other specialists and medical decision making of high complexity.I have made any additions or clarifications directly to the above note.This critical care time does not reflect procedure time, or teaching time or supervisory time of PA/NP/Med Resident etc but could involve care discussion time.  I spent 30 minutes of neurocritical care time  in the care of  this patient.     Antony Contras, MD Medical Director [facility] Pager: [phone] 12/12/2017 2:07 PM    To contact Stroke Continuity provider, please refer to [email or web address] After hours, contact General Neurology

## 2017-12-13 ENCOUNTER — Inpatient Hospital Stay (HOSPITAL_COMMUNITY): Payer: Medicaid Other

## 2017-12-13 DIAGNOSIS — R0902 Hypoxemia: Secondary | ICD-10-CM

## 2017-12-13 DIAGNOSIS — Z930 Tracheostomy status: Secondary | ICD-10-CM

## 2017-12-13 LAB — BASIC METABOLIC PANEL
ANION GAP: 13 (ref 5–15)
BUN: 17 mg/dL (ref 6–20)
CALCIUM: 8.9 mg/dL (ref 8.9–10.3)
CO2: 21 mmol/L — AB (ref 22–32)
Chloride: 106 mmol/L (ref 98–111)
Creatinine, Ser: 0.71 mg/dL (ref 0.44–1.00)
GFR calc Af Amer: >60 mL/min (ref >60)
GFR calc non Af Amer: >60 mL/min (ref >60)
GLUCOSE: 167 mg/dL — AB (ref 70–99)
Potassium: 3.3 mmol/L — ABNORMAL LOW (ref 3.5–5.1)
Sodium: 140 mmol/L (ref 135–145)

## 2017-12-13 LAB — GLUCOSE, CAPILLARY
GLUCOSE-CAPILLARY: 135 mg/dL — AB (ref 70–99)
Glucose-Capillary: 99 mg/dL (ref 70–99)
Glucose-Capillary: 114 mg/dL — ABNORMAL HIGH (ref 70–99)
Glucose-Capillary: 132 mg/dL — ABNORMAL HIGH (ref 70–99)
Glucose-Capillary: 107 mg/dL — ABNORMAL HIGH (ref 70–99)
Glucose-Capillary: 101 mg/dL — ABNORMAL HIGH (ref 70–99)
Glucose-Capillary: 135 mg/dL — ABNORMAL HIGH (ref 70–99)

## 2017-12-13 NOTE — Plan of Care (Signed)
Patient tolerating trach collar at this time.

## 2017-12-13 NOTE — Progress Notes (Addendum)
NAME:  Tammie Miles, MRN:  [id number], DOB:  Apr 08, 1975, LOS: 16 ADMISSION DATE:  11/27/2017, CONSULTATION DATE:  11/27/2017 REFERRING MD:  Dr. Conchita Paris CHIEF COMPLAINT:  Left frontal hemorrhage  Brief History   42 yoF admitted 9/19 with AMS that began the evening of 9/18, initially thought due to intoxication. Remained unresponsive through am of 9/19.  Patient was brought to the ED where a CT was done and showed a large left sided ICH. Patient was taken to the OR and a mini craniectomy was done.  Patient was brought out to the ICU and PCCM was consulted for vent management. Course complicated by cerebral edema requiring 3% saline limited by hypernatremia. Also complicated by encephalopathy. Tolerating vent wean, but mental status limited extubation. Then 9/25 she developed what appeared to be seizures with gaze deviation and tongue biting. EEG done overnight with results pending and AEDs adjusted. No further seizure seen.   Significant Hospital Events   9/19 Admit/ OR- mini crani 9/20 Decompressive craniectomy ( L)  with skull harvesting in the abdomen 9/21 hypertonic saline started 9/23 hypertonic stopped.  9/25 Seizure. EEG done with no sz activity. AEDs adjusted. 10/1 Trach / PEG 10/2 Trach collar  10/3  Trach collar  Consults: date of consult/date signed off & final recs  9/19 NSGY 9/19 PCCM   Procedures (surgical and bedside):  9/19 Intubated 9/19 OR for crani, evac of hematoma>> no change in neuro status post op 9/20 Decompressive craniectomy ( L)  with skull harvesting in the abdomen  Significant Diagnostic Tests:  9/19 [facility]  Large area of hemorrhage in the left basal ganglia and left frontal lobe, 5.8 x 3.5 cm. 15 mm of left-to-right midline shift. 9/19 CTA head: Positive for left MCA M1 large vessel occlusion, and also severe stenosis at the left ICA terminus, superimposed on the relatively large acute left hemisphere intra-axial hemorrhage (estimated blood volume 43 mL).  Vasospasm suspected in the proximal ACAs. 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 2. Negative for intracranial aneurysm, CTA spot sign, or evidence of vascular malformation in association with the acute hemorrhage. 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 3. Stable intracranial mass effect since 0938 hours today, with 16 mm of leftward midline shift and trapping of the right lateral ventricle. 11/29/2017 CT Head Large left MCA infarct is stable in distribution. Interval left frontal craniectomy with mild herniation of left frontal lobe through the defect. Resolved uncal herniation, improved patency of left lateral ventricle, and 6 mm left-to-right midline shift, previously 8 mm. Partial interval dispersion of acute hemorrhage in the left basal ganglia. Subcentimeter density anterior to the prior position of blood products possibly representing retracted clot or interval hemorrhage, attention at follow-up recommended. CT 9/24: 1. Left MCA distribution infarction is stable in distribution. Mild increased edema of the infarct with increased herniation of the left frontal lobe via the left frontal craniectomy. 2. Stable 6 mm left-to-right midline shift and partial effacement of left lateral ventricle. 3. Stable small hematoma within the left basal ganglia. 4. No new acute intracranial abnormality identified.  Micro Data: 9/19 MRSA PCR >> Negative 9/19 Sputum Culture >> normal flora 9/22 Blood x 2 >> negative 9/22 UA >> Normal 9/22 Sputum >> few candida dubliniensis 12/13/2017 blood cultures x2>> 12/13/2017 urine culture>> 12/13/2017 sputum culture>>  Antimicrobials:  9/19 Cefazolin (pre-op) 9/20  Subjective:  Desaturated during the night.  Currently remains on trach collar  Objective   Blood pressure (!)  145/85, pulse 96, temperature 99 F (37.2 C), temperature source Rectal, resp. rate (!) 23, height 5\' 1"  (1.549 m), weight 72.9 kg, SpO2 100 %.    FiO2 (%):  [28 %] 28 %   Intake/Output Summary (Last 24 hours) at 12/13/2017 0814 Last data filed at 12/13/2017 0700 Gross per 24 hour  Intake 2511.8 ml  Output 750 ml  Net 1761.8 ml   Filed Weights   12/11/17 0423 12/12/17 0500 12/13/17 0213  Weight: 74.6 kg 71.7 kg 72.9 kg    Intake/Output Summary (Last 24 hours) at 12/13/2017 0820 Last data filed at 12/13/2017 0700 Gross per 24 hour  Intake 2511.8 ml  Output 750 ml  Net 1761.8 ml    Examination:  General: Middle-aged female no acute distress HEENT: Tracheostomy in place Neuro: Moves left side spontaneously reported to follow commands CV: Sounds are regular currently with sinus tach 120 PULM: even/non-labored, lungs bilaterally rhonchi ZO:XWRU, non-tender, bsx4 active  Extremities: warm/dry, 1+ edema  Skin: no rashes or lesions   Resolved Hospital Problem list     Assessment & Plan:   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 Now status post hemicraniotomy for evacuation. Complicated by cerebral edema requiring hypertonic saline, which has since been stopped. She remains slow to improve.  -Continue current treatment -Suspect it will be a slow process and not a complete recovery  Severe hypertension with adequate control at present.  -Continue antihypertensives, 12/13/2017 blood pressure control currently adequate  Acute respiratory insufficiency in the setting of massive IPH Continue to wean off ventilator as tolerated to make her more acceptable for placement Check chest x-ray  Central fever - improving. Cultures are negative.  Leukocytosis -stable - likely neuro driven. PCT 0.13, remains reassuring. Sputum culture with few candida.  -12/13/2017 panculture per neurology -No antibiotics at this time  Anemia of acute  illness. Recent Labs    12/11/17 0230 12/12/17 0339  HGB 8.8* 8.5*    -Transfusion per protocol  Protein calorie malnutrition with tube feed diarrhea. -Continue tube feedings   Possible ETOH abuse -Continue thiamine and folic acid  Hx ADHD/ depression / anxiety -Adderall on hold  Disposition / Summary of Today's Plan 12/13/17   Currently remains on trach collar Noted to have an episode of desaturations on 12/12/2017 will check chest x-ray for completeness and will leave her on trach collar for now.    Diet: TF  Pain/Anxiety/Delirium protocol: PRN low dose fentanyl  DVT prophylaxis: SCD's GI prophylaxis: pepcid  Hyperglycemia protocol: SSI Mobility: BR Code Status: full Family Communication: 9/30 spoke with son, reviewed patients overall state of health.  He is very articulate in his understanding of her illness.   He requests that we continue full support / proceed with trach as he wishes to give his little sister as much time with her mother as possible (she is 7).   12/13/2017 no family at bedside at time of examination  Labs   CBC: Recent Labs  Lab 12/07/17 0322 12/08/17 0506 12/09/17 0332 12/10/17 0319 12/11/17 0230 12/12/17 0339  WBC 14.3* 13.9* 15.0* 14.5* 16.4* 13.5*  NEUTROABS 10.0* 10.0*  --   --   --   --   HGB 7.7* 8.2* 8.1* 9.6* 8.8* 8.5*  HCT 26.0* 27.3* 26.6* 30.9* 28.4* 28.0*  MCV 83.9 83.5 82.6 81.7 81.6 81.9  PLT 240 280 319 284  326 360   Basic Metabolic Panel: Recent Labs  Lab 12/07/17 0322 12/08/17 0506 12/09/17 0332 12/10/17 0319 12/10/17 1025 12/11/17 0911 12/12/17 0339  NA 147* 143 143 137 139 138 141  K 3.5 3.1* 4.1 4.1 3.3* 3.8 3.7  CL 115* 112* 107 102 103 103 106  CO2 24 23 23 23 23 22 24   GLUCOSE 120* 127* 88 96 136* 103* 104*  BUN 23* 19 23* 20 18 16 20   CREATININE 0.61 0.52 0.64 0.68 0.68 0.62 0.66  CALCIUM 8.7* 8.5* 9.5 9.2 9.0 8.9 9.0  MG 2.2 2.0 2.2  --   --   --   --    GFR: Estimated Creatinine Clearance: 83.6  mL/min (by C-G formula based on SCr of 0.66 mg/dL). Recent Labs  Lab 12/09/17 0332 12/10/17 0319 12/11/17 0230 12/12/17 0339  WBC 15.0* 14.5* 16.4* 13.5*   Liver Function Tests: Recent Labs  Lab 12/07/17 0322 12/08/17 0506  AST 31 29  ALT 36 32  ALKPHOS 178* 160*  BILITOT 0.4 0.5  PROT 6.1* 6.4*  ALBUMIN 2.1* 2.1*   No results for input(s): LIPASE, AMYLASE in the last 168 hours. No results for input(s): AMMONIA in the last 168 hours. ABG    Component Value Date/Time   PHART 7.444 12/08/2017 0414   PCO2ART 36.7 12/08/2017 0414   PO2ART 109 (H) 12/08/2017 0414   HCO3 24.6 12/08/2017 0414   TCO2 27 12/05/2017 1216   ACIDBASEDEF 6.0 (H) 11/29/2017 0310   O2SAT 98.2 12/08/2017 0414    Coagulation Profile: Recent Labs  Lab 12/07/17 0322 12/08/17 0506 12/09/17 0332  INR 1.13 1.09 1.13   Cardiac Enzymes: No results for input(s): CKTOTAL, CKMB, CKMBINDEX, TROPONINI in the last 168 hours. HbA1C: Hgb A1c MFr Bld  Date/Time Value Ref Range Status  11/28/2017 08:32 AM 5.8 (H) 4.8 - 5.6 % Final    Comment:    (NOTE) Pre diabetes:          5.7%-6.4% Diabetes:              >6.4% Glycemic control for   <7.0% adults with diabetes    CBG: Recent Labs  Lab 12/12/17 1111 12/12/17 1558 12/12/17 1915 12/12/17 2333 12/13/17 0401  GLUCAP 102* 93 135* 135* 99    Steve Minor ACNP [facility] PCCM Pager [phone] till 1 pm If no answer page [phone] 12/13/2017, 8:14 AM  Attending Note:  42 year old female with PMH of HTN presenting with an ischemic CVA who had a hemorrhagic conversion.  Patient was taken to the OR for a craniectomy and is now trached.  On exam, tolerating TC well with coarse BS.  I reviewed CXR myself, trach is in a good position.  Discussed with PCCM-NP.    Respiratory failure:  - Maintain on TC as tolerated at this point  Hypoxemia:   - Titrate O2 for sat of 88-92%  Tracheostomy status:  - Maintain current trach size and type  - If  tolerating TC by Monday will change to a cuffless 6  - No decannulation given mental status  AMS:  - Minimize sedation  - Follow up clinically   PCCM will continue to follow for trach care and respiratory condition  Patient seen and examined, agree with above note.  I dictated the care and orders written for this patient under my direction.  Alyson Reedy, MD [phone]

## 2017-12-13 NOTE — Progress Notes (Signed)
NEUROHOSPITALISTS STROKE TEAM - DAILY PROGRESS NOTE   SUBJECTIVE No family is at bedside.   Patient with trach collar in place on ventilator, non-labored breathing. Last night she desaturated and had to be bagged briefly. Was not placed back on ventilator and still on trach collar. OBJECTIVE Most recent Vital Signs: Vitals:   12/13/17 0500 12/13/17 0600 12/13/17 0700 12/13/17 0731  BP: 137/79 (!) 128/112 (!) 145/85 (!) 145/85  Pulse: 92 93 90 96  Resp: (!) 25 18 (!) 26 (!) 23  Temp:      TempSrc:      SpO2: 100% 100% 100% 100%  Weight:      Height:       CBG (last 3)  Recent Labs    12/12/17 1915 12/12/17 2333 12/13/17 0401  GLUCAP 135* 135* 99    Physical Exam  Temp:  [99 F (37.2 C)-101 F (38.3 C)] 99 F (37.2 C) (10/05 0400) Pulse Rate:  [57-132] 96 (10/05 0731) Resp:  [18-42] 23 (10/05 0731) BP: (108-213)/(63-112) 145/85 (10/05 0731) SpO2:  [52 %-100 %] 100 % (10/05 0731) FiO2 (%):  [28 %] 28 % (10/05 0731) Weight:  [72.9 kg] 72.9 kg (10/05 0213)   HEENT-status post left frontoparietal craniotomy with wound clean dry and stitches intact and status post tracheostomy on 11/28/17  cardiovascular- S1-S2 audible, pulses palpable throughout   Lungs- low breath sounds bilaterally, Status post tracheostomy on vent with nonlabored breathing Abdomen-hypoactive bowel sounds  musculoskeletal-no joint tenderness, deformity or swelling Skin-warm and dry Neurological Exam :  Patient is awake, aphasic, and does not respond to commands.  Does open eyes on voice with bilateral roving eye movement,  b/l eyelid swollen, R>L but improving, PERRL, eye left gaze preference but able to cross midline. Not blinking to visual threat consistently, not tracking, but does move pupils spontaneously. Left corneal positive, and right absent. Gag positive.  No facial asymmetry noted.  LUE localized to pain with effort  against gravity. LLE withdraw  to pain 2+/5. RUE flaccid with increased tone, no response to stimuli and RLE mild withdraw to pain. B/l babinski positive. Sensation, coordination and gait not tested.    Lipid Panel    Component Value Date/Time   CHOL 138 11/28/2017 0832   TRIG 414 (H) 11/30/2017 1658   HDL 42 11/28/2017 0832   CHOLHDL 3.3 11/28/2017 0832   VLDL 32 11/28/2017 0832   LDLCALC 64 11/28/2017 0832   HgbA1C  Lab Results  Component Value Date   HGBA1C 5.8 (H) 11/28/2017    Urine Drug Screen:      Component Value Date/Time   LABOPIA NONE DETECTED 11/27/2017 1415   COCAINSCRNUR NONE DETECTED 11/27/2017 1415   LABBENZ NONE DETECTED 11/27/2017 1415   AMPHETMU POSITIVE (A) 11/27/2017 1415   THCU NONE DETECTED 11/27/2017 1415   LABBARB NONE DETECTED 11/27/2017 1415    Alcohol Level:  No results for input(s): ETH in the last 168 hours.    IMAGING  Ct Angio Head W Or Wo Contrast  IMPRESSION:  1. Positive for left MCA M1 large vessel occlusion, and also severe stenosis at the left ICA terminus, superimposed on the relatively large acute left hemisphere intra-axial hemorrhage (estimated blood volume 43 mL). Vasospasm suspected in the proximal ACAs. 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  2. Negative for intracranial aneurysm, CTA spot sign, or evidence of vascular malformation in association with the acute hemorrhage. However, mild fusiform aneurysmal enlargement is noted  in both distal cervical ICAs (greater on the right, 8 mm diameter). 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  3. Stable intracranial mass effect since 0938 hours today, with 16 mm of leftward midline shift and trapping of the right lateral ventricle.    Ct Head Wo Contrast  11/28/2017 IMPRESSION:  1. Stable when compared to yesterday.  2. Left MCA territory infarct with evacuated hematoma. Midline shift measures 8 mm.  3. 2-3 mm subdural hematoma along the  right cerebral convexity without interval increase.    Ct Head Wo Contrast  11/27/2017 IMPRESSION:  1. Interval LEFT frontal craniotomy for basal ganglia hematoma evacuation, small amount of residual blood products and regional edema. 7 mm residual LEFT to RIGHT midline shift, resolved RIGHT ventricular entrapment.  2. Trace RIGHT holo hemispheric and tentorial subdural hematomas. Global edema.    Ct Head Wo Contrast 11/27/2017 IMPRESSION:  Large area of hemorrhage in the left basal ganglia and left frontal lobe, 5.8 x 3.5 cm. 15 mm of left-to-right midline shift.   Mr Brain Wo Contrast 11/28/2017 IMPRESSION:  1. Postoperative changes from previous left frontal craniotomy for evacuation of left basal ganglia hematoma. Residual blood products within the evacuation cavity with persistent surrounding vasogenic edema and regional mass effect. Associated 8-9 mm of right-to-left midline shift with mild left uncal herniation similar to previous. No hydrocephalus or ventricular trapping.  2. Underlying evolving large acute ischemic left MCA territory infarct involving the majority of the left MCA territory.  3. Trace right cerebral and tentorial subdural hematoma without mass effect.  4. Right sphenoid sinusitis.  Ct Head Wo Contrast 11/29/2017 IMPRESSION:  1. Large left MCA infarct is stable in distribution.  2. Interval left frontal craniectomy with mild herniation of left frontal lobe through the defect.  3. Resolved uncal herniation, improved patency of left lateral ventricle, and 6 mm left-to-right midline shift, previously 8 mm.  4. Partial interval dispersion of acute hemorrhage in the left basal ganglia. Subcentimeter density anterior to the prior position of blood products possibly representing retracted clot or interval hemorrhage, attention at follow-up recommended.    Ct Head Wo Contrast 12/02/2017 IMPRESSION:  1. Left MCA distribution infarction is stable in distribution. Mild  increased edema of the infarct with increased herniation of the left frontal lobe via the left frontal craniectomy.  2. Stable 6 mm left-to-right midline shift and partial effacement of left lateral ventricle.  3. Stable small hematoma within the left basal ganglia.  4. No new acute intracranial abnormality identified.    DG Chest Portable 1 View - minimal R basliar density, atelectasis vs infiltrate    ASSESSMENT/PLAN Stroke:  Acute Large left MCA infarct with large hemorrhagic transformation with uncal herniation s/ p hemicraniotomy for evacuation of hematoma and increased ICP due to left MCA occlusion, embolic pattern, source unclear  Resultant - intubated, on sedation, right hemiplegia  CTA head & neck : Positive for left MCA M1 large vessel occlusion, and also severe stenosis at the left ICA terminus  CT of the brain: Large area of hemorrhage in the left basal ganglia and left frontal lobe. 15 mm of left-to-right midline shift.  MRI of the brain: Post craniotomy 8-9 mm of right-to-left midline shift with mild left uncal herniation similar to previous. Underlying evolving large acute ischemic left MCA territory infarct involving the majority of the left MCA territory.  Repeat Head CT post evacuation 9/20 - Left MCA territory infarct with evacuated hematoma. Midline shift measures 8 mm.   UDS - positive for  amphetamines ( Pt takes Adderal at home)  2D Echocardiogram EF 60 to 65%  LE venous doppler negative for DVT  Hypercoagulable labs negative except high ESR and CRP  LDL 64  HgbA1c 5.8  VTE: Heparin subq  Antithrombotic: no antithrombotics secondary to [place], started ASA 81 12/04/17.   Therapy recommendations: signed off for now, re-consult later  Disposition: Pending placement in possible  LTAC  (called social worker for updates currently awaiting callback)   Cerebral edema  CT head midline shift 69m with uncal herniation  Repeat CT head s/p hematoma evacuation -  midline shift 87m S/p hemicrani 9/20  Repeat CT 9/21 - midline shift 18m71mOn keppra for seizure prevention  CT repeat 12/02/2017 stable midline shift, large left MCA infarct, residual hematoma  3% saline d/c'ed, now on NS  Na stable at 150   DC hypertonic saline 12/06/17 and check na daily - 143 today   Acute respiratory failure in the setting of massive IPH  Status post tracheostomy  On 12/09/17  CCM on board  Patient on trach collar  Patient to transfer to floor in the next 24 to 48 hours  Continue trach care per protocol  Intermittent chest x-rays  Chest x-ray done on 12/13/2017 shows atelectasis vs new small R infiltrate  Seizure  Episode of stiffness, tongue biting  Overnight EEG no seizure  Increased keppra from 500 to 1000m70md  No more seizure since  Seizure precautions  Hypertension emergency  SBP stable :140s -160s  BP goal <160   On norvasc and lisinopril  On labetalol to 300mg59mimes daily  Clonidine 0.2 mg Q 6 hrs prn SBP > 160    Anemia - pt is in her period  Hb 11.8 on admission  Hemoglobin 8.4 -> 7.7-> 8.2->7.2->6.9->PRBC->8.3->8.6->8.1->7.7->8.2-->8.8  Received one unit PRBC   Continue to monitor  Iron study iron 36 and ferritin 66  Pt currently on  iron pills  PRBC transfusion if HGb < 7.0  Leukocytosis   WBC 21.5-16.8-> 14.4-> 13.3->13.4->13.5->13.8->13.7->14.3->13.9-->16.4-->13.5  Spiked fever overnight, Central vs Infectious, CXR as above. UA, Bcx and Sputum Cx pending, no antibiotics as this time, will continue to trend WBC and fever  Continue monitoring  Nutrition  PEG tube placed on 12/09/2017 for tube feeding  Accu-Chek every 6 hours while on tube feeding   Other Stroke Risk Factors  ETOH use   Other Active Problems  ADHD - on Adderell at home  Anxiety  Depression  Hypokalemia  Seen by Dr AroraRory Percy after midnight on 9/28 - Na 149 -> 23.4% x 1 -> repeat Na 143 12/08/17 . CCM on board. Hypertonic  saline dc'd 12/06/17. Check Na only once daily. 141 (12/12/2017)  Anemia 8.5 / 28.0 stable -> PRBC transfusion if HGb < 7.0  Hospital day # 16          To contact Stroke Continuity provider, please refer to [email or web address], contact General Neurology

## 2017-12-14 ENCOUNTER — Inpatient Hospital Stay (HOSPITAL_COMMUNITY): Payer: Medicaid Other

## 2017-12-14 LAB — CBC WITH DIFFERENTIAL/PLATELET
ABS IMMATURE GRANULOCYTES: 0 10*3/uL (ref 0.0–0.1)
BASOS PCT: 1 %
Basophils Absolute: 0.1 10*3/uL (ref 0.0–0.1)
EOS ABS: 0.3 10*3/uL (ref 0.0–0.7)
Eosinophils Relative: 3 %
HEMATOCRIT: 24.4 % — AB (ref 36.0–46.0)
HEMOGLOBIN: 7.3 g/dL — AB (ref 12.0–15.0)
IMMATURE GRANULOCYTES: 0 %
LYMPHS ABS: 2.4 10*3/uL (ref 0.7–4.0)
Lymphocytes Relative: 25 %
MCH: 24.6 pg — ABNORMAL LOW (ref 26.0–34.0)
MCHC: 29.9 g/dL — ABNORMAL LOW (ref 30.0–36.0)
MCV: 82.2 fL (ref 78.0–100.0)
Monocytes Absolute: 0.8 10*3/uL (ref 0.1–1.0)
Monocytes Relative: 8 %
Neutro Abs: 5.9 10*3/uL (ref 1.7–7.7)
Neutrophils Relative %: 63 %
Platelets: 393 10*3/uL (ref 150–400)
RBC: 2.97 MIL/uL — AB (ref 3.87–5.11)
RDW: 20.9 % — AB (ref 11.5–15.5)
WBC: 9.4 10*3/uL (ref 4.0–10.5)

## 2017-12-14 LAB — CBC
HCT: 26 % — ABNORMAL LOW (ref 36.0–46.0)
Hemoglobin: 8 g/dL — ABNORMAL LOW (ref 12.0–15.0)
MCH: 25.1 pg — ABNORMAL LOW (ref 26.0–34.0)
MCHC: 30.8 g/dL (ref 30.0–36.0)
MCV: 81.5 fL (ref 78.0–100.0)
PLATELETS: 398 10*3/uL (ref 150–400)
RBC: 3.19 MIL/uL — AB (ref 3.87–5.11)
RDW: 20.6 % — ABNORMAL HIGH (ref 11.5–15.5)
WBC: 9.5 10*3/uL (ref 4.0–10.5)

## 2017-12-14 LAB — SODIUM: SODIUM: 140 mmol/L (ref 135–145)

## 2017-12-14 LAB — URINE CULTURE
Culture: <10000 — AB
SPECIAL REQUESTS: NORMAL

## 2017-12-14 LAB — PHOSPHORUS: PHOSPHORUS: 4.2 mg/dL (ref 2.5–4.6)

## 2017-12-14 LAB — MAGNESIUM: Magnesium: 2.2 mg/dL (ref 1.7–2.4)

## 2017-12-14 LAB — GLUCOSE, CAPILLARY
GLUCOSE-CAPILLARY: 103 mg/dL — AB (ref 70–99)
GLUCOSE-CAPILLARY: 96 mg/dL (ref 70–99)
GLUCOSE-CAPILLARY: 107 mg/dL — AB (ref 70–99)
GLUCOSE-CAPILLARY: 126 mg/dL — AB (ref 70–99)
Glucose-Capillary: 124 mg/dL — ABNORMAL HIGH (ref 70–99)

## 2017-12-14 NOTE — Progress Notes (Signed)
NAME:  Tammie Miles, MRN:  [id number], DOB:  1975/04/04, LOS: 17 ADMISSION DATE:  11/27/2017, CONSULTATION DATE:  11/27/2017 REFERRING MD:  Dr. Conchita Paris CHIEF COMPLAINT:  Left frontal hemorrhage  Brief History   63 yoF admitted 9/19 with AMS that began the evening of 9/18, initially thought due to intoxication. Remained unresponsive through am of 9/19.  Patient was brought to the ED where a CT was done and showed a large left sided ICH. Patient was taken to the OR and a mini craniectomy was done.  Patient was brought out to the ICU and PCCM was consulted for vent management. Course complicated by cerebral edema requiring 3% saline limited by hypernatremia. Also complicated by encephalopathy. Tolerating vent wean, but mental status limited extubation. Then 9/25 she developed what appeared to be seizures with gaze deviation and tongue biting. EEG done overnight with results pending and AEDs adjusted. No further seizure seen.   Significant Hospital Events   9/19 Admit/ OR- mini crani 9/20 Decompressive craniectomy ( L)  with skull harvesting in the abdomen 9/21 hypertonic saline started 9/23 hypertonic stopped.  9/25 Seizure. EEG done with no sz activity. AEDs adjusted. 10/1 Trach / PEG 10/2 Trach collar    Consults: date of consult/date signed off & final recs  9/19 NSGY 9/19 PCCM   Procedures (surgical and bedside):  9/19 Intubated 9/19 OR for crani, evac of hematoma>> no change in neuro status post op 9/20 Decompressive craniectomy ( L)  with skull harvesting in the abdomen  Significant Diagnostic Tests:  9/19 [facility]  Large area of hemorrhage in the left basal ganglia and left frontal lobe, 5.8 x 3.5 cm. 15 mm of left-to-right midline shift. 9/19 CTA head: Positive for left MCA M1 large vessel occlusion, and also severe stenosis at the left ICA terminus, superimposed on the relatively large acute left hemisphere intra-axial hemorrhage (estimated blood volume 43 mL). Vasospasm suspected in  the proximal ACAs. 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 2. Negative for intracranial aneurysm, CTA spot sign, or evidence of vascular malformation in association with the acute hemorrhage. 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 3. Stable intracranial mass effect since 0938 hours today, with 16 mm of leftward midline shift and trapping of the right lateral ventricle. 11/29/2017 CT Head Large left MCA infarct is stable in distribution. Interval left frontal craniectomy with mild herniation of left frontal lobe through the defect. Resolved uncal herniation, improved patency of left lateral ventricle, and 6 mm left-to-right midline shift, previously 8 mm. Partial interval dispersion of acute hemorrhage in the left basal ganglia. Subcentimeter density anterior to the prior position of blood products possibly representing retracted clot or interval hemorrhage, attention at follow-up recommended. CT 9/24: 1. Left MCA distribution infarction is stable in distribution. Mild increased edema of the infarct with increased herniation of the left frontal lobe via the left frontal craniectomy. 2. Stable 6 mm left-to-right midline shift and partial effacement of left lateral ventricle. 3. Stable small hematoma within the left basal ganglia. 4. No new acute intracranial abnormality identified.  Micro Data: 9/19 MRSA PCR >> Negative 9/19 Sputum Culture >> normal flora 9/22 Blood x 2 >> negative 9/22 UA >> Normal 9/22 Sputum >> few candida dubliniensis 12/13/2017 blood cultures x2>> 12/13/2017 urine culture>> 12/13/2017 sputum culture>>  Antimicrobials:  9/19 Cefazolin (pre-op) 9/20  Subjective:  No events overnight,  no new complaints, tolerated TC well without desaturations  Objective   Blood pressure (!) 135/94,  pulse 84, temperature 98.6 F (37 C), temperature source Rectal, resp. rate (!) 23, height 5\' 1"  (1.549 m), weight 76.2 kg, SpO2 100 %.    FiO2 (%):  [28 %] 28 %   Intake/Output Summary (Last 24 hours) at 12/14/2017 0852 Last data filed at 12/14/2017 0700 Gross per 24 hour  Intake 2692.36 ml  Output 2050 ml  Net 642.36 ml   Filed Weights   12/12/17 0500 12/13/17 0213 12/14/17 0440  Weight: 71.7 kg 72.9 kg 76.2 kg    Intake/Output Summary (Last 24 hours) at 12/14/2017 0852 Last data filed at 12/14/2017 0700 Gross per 24 hour  Intake 2692.36 ml  Output 2050 ml  Net 642.36 ml   Examination:  General: Middle aged female, on TC, NAD HEENT: [place]/AT, PERRL, EOM-I and MMM, trach in place Neuro: Moves left side spontaneously reported to follow commands CV: RRR, Nl S1/S2 and -M/R/G PULM: even/non-labored, lungs bilaterally rhonchi JX:BJYN, non-tender, bsx4 active  Extremities: warm/dry, 1+ edema  Skin: no rashes or lesions  Resolved Hospital Problem list     Assessment & Plan:   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 Now status post hemicraniotomy for evacuation. Complicated by cerebral edema requiring hypertonic saline, which has since been stopped. She remains slow to improve.  - Continue to hold sedation - Expect slow recovery  Severe hypertension with adequate control at present.  - Continue current medication for HTN, no need for change  Acute respiratory insufficiency in the setting of massive IPH - Maintain on TC as tolerated - Trach placed on 10/1, sutures to be removed on Tuesday - CXR to PRN at this point - Ok to move to floor - PCCM will follow twice weekly for trach management  Central fever - improving. Cultures are negative.  Leukocytosis -stable - likely neuro driven. PCT 0.13, remains reassuring. Sputum culture with few candida.  - 12/13/2017 panculture per neurology - Monitor off abx  Anemia of acute  illness. Recent Labs    12/12/17 0339 12/14/17 0256  HGB 8.5* 7.3*   - Transfusion per ICU protocol  Protein calorie malnutrition with tube feed diarrhea. - Continue TF   Possible ETOH abuse - Thiamine and folate and MVI  Hx ADHD/ depression / anxiety - Adderall on hold  Disposition / Summary of Today's Plan 12/14/17   Maintain on TC as tolerated, trach management as above, may move to floor, PCCM will f/u twice weekly for trach care    Diet: TF  Pain/Anxiety/Delirium protocol: PRN low dose fentanyl  DVT prophylaxis: SCD's GI prophylaxis: pepcid  Hyperglycemia protocol: SSI Mobility: BR Code Status: full Family Communication: 9/30 spoke with son, reviewed patients overall state of health.  He is very articulate in his understanding of her illness.   He requests that we continue full support / proceed with trach as he wishes to give his little sister as much time with her mother as possible (she is 7).   12/13/2017 no family at bedside at time of examination  Labs   CBC: Recent Labs  Lab 12/08/17 0506 12/09/17 0332 12/10/17 0319 12/11/17 0230 12/12/17 0339 12/14/17 0256  WBC 13.9* 15.0* 14.5* 16.4* 13.5* 9.4  NEUTROABS 10.0*  --   --   --   --  5.9  HGB 8.2* 8.1* 9.6* 8.8* 8.5* 7.3*  HCT 27.3* 26.6* 30.9* 28.4* 28.0* 24.4*  MCV  83.5 82.6 81.7 81.6 81.9 82.2  PLT 280 319 284 326 360 393   Basic Metabolic Panel: Recent Labs  Lab 12/08/17 0506 12/09/17 0332 12/10/17 0319 12/10/17 1025 12/11/17 0911 12/12/17 0339 12/13/17 0949 12/14/17 0256  NA 143 143 137 139 138 141 140  --   K 3.1* 4.1 4.1 3.3* 3.8 3.7 3.3*  --   CL 112* 107 102 103 103 106 106  --   CO2 23 23 23 23 22 24  21*  --   GLUCOSE 127* 88 96 136* 103* 104* 167*  --   BUN 19 23* 20 18 16 20 17   --   CREATININE 0.52 0.64 0.68 0.68 0.62 0.66 0.71  --   CALCIUM 8.5* 9.5 9.2 9.0 8.9 9.0 8.9  --   MG 2.0 2.2  --   --   --   --   --  2.2  PHOS  --   --   --   --   --   --   --  4.2    GFR: Estimated Creatinine Clearance: 85.6 mL/min (by C-G formula based on SCr of 0.71 mg/dL). Recent Labs  Lab 12/10/17 0319 12/11/17 0230 12/12/17 0339 12/14/17 0256  WBC 14.5* 16.4* 13.5* 9.4   Liver Function Tests: Recent Labs  Lab 12/08/17 0506  AST 29  ALT 32  ALKPHOS 160*  BILITOT 0.5  PROT 6.4*  ALBUMIN 2.1*   No results for input(s): LIPASE, AMYLASE in the last 168 hours. No results for input(s): AMMONIA in the last 168 hours. ABG    Component Value Date/Time   PHART 7.444 12/08/2017 0414   PCO2ART 36.7 12/08/2017 0414   PO2ART 109 (H) 12/08/2017 0414   HCO3 24.6 12/08/2017 0414   TCO2 27 12/05/2017 1216   ACIDBASEDEF 6.0 (H) 11/29/2017 0310   O2SAT 98.2 12/08/2017 0414    Coagulation Profile: Recent Labs  Lab 12/08/17 0506 12/09/17 0332  INR 1.09 1.13   Cardiac Enzymes: No results for input(s): CKTOTAL, CKMB, CKMBINDEX, TROPONINI in the last 168 hours. HbA1C: Hgb A1c MFr Bld  Date/Time Value Ref Range Status  11/28/2017 08:32 AM 5.8 (H) 4.8 - 5.6 % Final    Comment:    (NOTE) Pre diabetes:          5.7%-6.4% Diabetes:              >6.4% Glycemic control for   <7.0% adults with diabetes    CBG: Recent Labs  Lab 12/13/17 1134 12/13/17 1508 12/13/17 2010 12/13/17 2313 12/14/17 0443  GLUCAP 132* 107* 101* 135* 103*   Alyson Reedy, M.D. [facility] Pulmonary/Critical Care Medicine. Pager: [phone]. After hours pager: [phone].

## 2017-12-14 NOTE — Plan of Care (Signed)
Patient is able to breath through trach with occaional suctioning to maintain patency.

## 2017-12-14 NOTE — Progress Notes (Signed)
NEUROHOSPITALISTS STROKE TEAM - DAILY PROGRESS NOTE   SUBJECTIVE No family is at bedside.   Patient with trach collar in place on ventilator, non-labored breathing. No events overnight, maintained on trach collar  OBJECTIVE Most recent Vital Signs: Vitals:   12/14/17 0810 12/14/17 1030 12/14/17 1032 12/14/17 1144  BP: (!) 135/94 (!) 159/101  (!) 164/95  Pulse: 84  88 90  Resp: (!) 23   20  Temp:      TempSrc:      SpO2: 100%   100%  Weight:      Height:       CBG (last 3)  Recent Labs    12/13/17 2010 12/13/17 2313 12/14/17 0443  GLUCAP 101* 135* 103*    Physical Exam unchanged  Temp:  [98.6 F (37 C)-99.8 F (37.7 C)] 98.6 F (37 C) (10/05 2315) Pulse Rate:  [84-102] 90 (10/06 1144) Resp:  [17-29] 20 (10/06 1144) BP: (102-164)/(68-101) 164/95 (10/06 1144) SpO2:  [98 %-100 %] 100 % (10/06 1144) FiO2 (%):  [28 %] 28 % (10/06 1144) Weight:  [76.2 kg] 76.2 kg (10/06 0440)   HEENT-status post left frontoparietal craniotomy with wound clean dry and stitches intact and status post tracheostomy on 11/28/17  cardiovascular- S1-S2 audible, pulses palpable throughout   Lungs- low breath sounds bilaterally, Status post tracheostomy on vent with nonlabored breathing Abdomen-hypoactive bowel sounds  musculoskeletal-no joint tenderness, deformity or swelling Skin-warm and dry Neurological Exam :  Patient is awake, aphasic, and does not respond to commands.  Does open eyes on voice with bilateral roving eye movement,  b/l eyelid swollen, R>L but improving, PERRL, eye left gaze preference but able to cross midline. Not blinking to visual threat consistently, not tracking, but does move pupils spontaneously. Left corneal positive, and right absent. Gag positive.  No facial asymmetry noted.  LUE localized to pain with effort  against gravity. LLE withdraw to pain 2+/5. RUE flaccid with increased tone, no response to stimuli and RLE mild  withdraw to pain. B/l babinski positive. Sensation, coordination and gait not tested.    Lipid Panel    Component Value Date/Time   CHOL 138 11/28/2017 0832   TRIG 414 (H) 11/30/2017 1658   HDL 42 11/28/2017 0832   CHOLHDL 3.3 11/28/2017 0832   VLDL 32 11/28/2017 0832   LDLCALC 64 11/28/2017 0832   HgbA1C  Lab Results  Component Value Date   HGBA1C 5.8 (H) 11/28/2017    Urine Drug Screen:      Component Value Date/Time   LABOPIA NONE DETECTED 11/27/2017 1415   COCAINSCRNUR NONE DETECTED 11/27/2017 1415   LABBENZ NONE DETECTED 11/27/2017 1415   AMPHETMU POSITIVE (A) 11/27/2017 1415   THCU NONE DETECTED 11/27/2017 1415   LABBARB NONE DETECTED 11/27/2017 1415    Alcohol Level:  No results for input(s): ETH in the last 168 hours.    IMAGING  Ct Angio Head W Or Wo Contrast  IMPRESSION:  1. Positive for left MCA M1 large vessel occlusion, and also severe stenosis at the left ICA terminus, superimposed on the relatively large acute left hemisphere intra-axial hemorrhage (estimated blood volume 43 mL). Vasospasm suspected in the proximal ACAs. 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  2. Negative for intracranial aneurysm, CTA spot sign, or evidence of vascular malformation in association with the acute hemorrhage. 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 Consider Fibromuscular  Dysplasia (FMD).  3. Stable intracranial mass effect since 0938 hours today, with 16 mm of leftward midline shift and trapping of the right lateral ventricle.    Ct Head Wo Contrast  11/28/2017 IMPRESSION:  1. Stable when compared to yesterday.  2. Left MCA territory infarct with evacuated hematoma. Midline shift measures 8 mm.  3. 2-3 mm subdural hematoma along the right cerebral convexity without interval increase.    Ct Head Wo Contrast   11/27/2017 IMPRESSION:  1. Interval LEFT frontal craniotomy for basal ganglia hematoma evacuation, small amount of residual blood products and regional edema. 7 mm residual LEFT to RIGHT midline shift, resolved RIGHT ventricular entrapment.  2. Trace RIGHT holo hemispheric and tentorial subdural hematomas. Global edema.    Ct Head Wo Contrast 11/27/2017 IMPRESSION:  Large area of hemorrhage in the left basal ganglia and left frontal lobe, 5.8 x 3.5 cm. 15 mm of left-to-right midline shift.   Mr Brain Wo Contrast 11/28/2017 IMPRESSION:  1. Postoperative changes from previous left frontal craniotomy for evacuation of left basal ganglia hematoma. Residual blood products within the evacuation cavity with persistent surrounding vasogenic edema and regional mass effect. Associated 8-9 mm of right-to-left midline shift with mild left uncal herniation similar to previous. No hydrocephalus or ventricular trapping.  2. Underlying evolving large acute ischemic left MCA territory infarct involving the majority of the left MCA territory.  3. Trace right cerebral and tentorial subdural hematoma without mass effect.  4. Right sphenoid sinusitis.  Ct Head Wo Contrast 11/29/2017 IMPRESSION:  1. Large left MCA infarct is stable in distribution.  2. Interval left frontal craniectomy with mild herniation of left frontal lobe through the defect.  3. Resolved uncal herniation, improved patency of left lateral ventricle, and 6 mm left-to-right midline shift, previously 8 mm.  4. Partial interval dispersion of acute hemorrhage in the left basal ganglia. Subcentimeter density anterior to the prior position of blood products possibly representing retracted clot or interval hemorrhage, attention at follow-up recommended.    Ct Head Wo Contrast 12/02/2017 IMPRESSION:  1. Left MCA distribution infarction is stable in distribution. Mild increased edema of the infarct with increased herniation of the left frontal lobe via  the left frontal craniectomy.  2. Stable 6 mm left-to-right midline shift and partial effacement of left lateral ventricle.  3. Stable small hematoma within the left basal ganglia.  4. No new acute intracranial abnormality identified.    DG Chest Portable 1 View -from today shows no acute disease     ASSESSMENT/PLAN Stroke:  Acute Large left MCA infarct with large hemorrhagic transformation with uncal herniation s/ p hemicraniotomy for evacuation of hematoma and increased ICP due to left MCA occlusion, embolic pattern, source unclear  Resultant - intubated, on sedation, right hemiplegia  CTA head & neck : Positive for left MCA M1 large vessel occlusion, and also severe stenosis at the left ICA terminus  CT of the brain: Large area of hemorrhage in the left basal ganglia and left frontal lobe. 15 mm of left-to-right midline shift.  MRI of the brain: Post craniotomy 8-9 mm of right-to-left midline shift with mild left uncal herniation similar to previous. Underlying evolving large acute ischemic left MCA territory infarct involving the majority of the left MCA territory.  Repeat Head CT post evacuation 9/20 - Left MCA territory infarct with evacuated hematoma. Midline shift measures 8 mm.   UDS - positive for amphetamines ( Pt takes Adderal at home)  2D Echocardiogram EF 60 to 65%  LE venous doppler negative for DVT  Hypercoagulable labs negative except high ESR and CRP  LDL 64  HgbA1c 5.8  VTE: Heparin subq  Antithrombotic: no antithrombotics secondary to [place], started ASA 81 12/04/17.   Therapy recommendations: signed off for now, re-consult later  Disposition: Pending placement in possible  LTAC  (called social worker for updates currently awaiting callback)   Cerebral edema  CT head midline shift 42m with uncal herniation  Repeat CT head s/p hematoma evacuation - midline shift 872m S/p hemicrani 9/20  Repeat CT 9/21 - midline shift 65m64mOn keppra for seizure  prevention  CT repeat 12/02/2017 stable midline shift, large left MCA infarct, residual hematoma  3% saline d/c'ed, now on NS  Na stable at 150   DC hypertonic saline 12/06/17 and check na daily - 143 today   Acute respiratory failure in the setting of massive IPH  Status post tracheostomy  On 12/09/17  CCM on board  Patient on trach collar  Patient to transfer to floor in the next 24 to 48 hours  Continue trach care per protocol  Intermittent chest x-rays  Chest x-ray done on 12/14/2017 shows no acute disease   Seizure  Episode of stiffness, tongue biting  Overnight EEG no seizure  Increased keppra from 500 to 1000m87md  No more seizure since  Seizure precautions  Hypertension emergency  SBP stable :140s -160s  BP goal <160   On norvasc and lisinopril  On labetalol to 300mg73mimes daily  Clonidine 0.2 mg Q 6 hrs prn SBP > 160    Anemia -   Hb 11.8 on admission  Hemoglobin 8.4 -> 7.7-> 8.2->7.2->6.9->PRBC->8.3->8.6->8.1->7.7->8.2-->8.8-->7.3-->8.0  Received one unit PRBC during this admission    Continue to monitor  Iron study iron 36 and ferritin 66  Pt currently on  iron pills  PRBC transfusion if HGb < 7.0  Leukocytosis- resolved   WBC 21.5-16.8-> 14.4-> 13.3->13.4->13.5->13.8->13.7->14.3->13.9-->16.4-->13.5-->9.5  No fever overnight, white count resolved, cultures pending , no antibiotics at this time  Continue monitoring  Nutrition  PEG tube placed on 12/09/2017 for tube feeding  Accu-Chek every 6 hours while on tube feeding   Other Stroke Risk Factors  ETOH use   Other Active Problems  ADHD - on Adderell at home  Anxiety  Depression  Hypokalemia  Seen by Dr AroraRory Percy after midnight on 9/28 - Na 149 -> 23.4% x 1 -> repeat Na 143 12/08/17 . CCM on board. Hypertonic saline dc'd 12/06/17. Check Na only once daily. 141 (12/12/2017) Repeat today  Anemia 8.5 / 28.0 stable -> PRBC transfusion if HGb < 7.0  Hgb 7.3 today -  repeat CBC shows Hgh 8.0, no transfusion at this point, will continue to monitor   Ok to transfer out of the ICU as per Critical care team, [id number]          To contact Stroke Continuity provider, please refer to [email or web address], contact General Neurology

## 2017-12-15 LAB — GLUCOSE, CAPILLARY
GLUCOSE-CAPILLARY: 104 mg/dL — AB (ref 70–99)
GLUCOSE-CAPILLARY: 92 mg/dL (ref 70–99)
Glucose-Capillary: 130 mg/dL — ABNORMAL HIGH (ref 70–99)
Glucose-Capillary: 136 mg/dL — ABNORMAL HIGH (ref 70–99)
Glucose-Capillary: 158 mg/dL — ABNORMAL HIGH (ref 70–99)
Glucose-Capillary: 87 mg/dL (ref 70–99)

## 2017-12-15 LAB — CULTURE, RESPIRATORY: SPECIAL REQUESTS: NORMAL

## 2017-12-15 LAB — CULTURE, RESPIRATORY W GRAM STAIN

## 2017-12-15 MED ORDER — HEPARIN SODIUM (PORCINE) 5000 UNIT/ML IJ SOLN
5000.0000 [IU] | Freq: Three times a day (TID) | INTRAMUSCULAR | Status: DC
  Administered 2017-12-15 – 2018-01-08 (×73): 5000 [IU] via SUBCUTANEOUS
  Filled 2017-12-15 (×73): qty 1

## 2017-12-15 MED ORDER — SODIUM CHLORIDE 0.9 % IV SOLN
INTRAVENOUS | Status: DC | PRN
  Administered 2017-12-15 – 2017-12-18 (×2): 250 mL via INTRAVENOUS

## 2017-12-15 NOTE — Progress Notes (Signed)
Received patient from [facility], assessment completed, VS documented, Will continue to monitor.  Pericare complete

## 2017-12-15 NOTE — Progress Notes (Signed)
NAME:  Tammie Miles, MRN:  [id number], DOB:  23-Apr-1975, LOS: 18 ADMISSION DATE:  11/27/2017, CONSULTATION DATE:  11/27/2017 REFERRING MD:  Dr. Conchita Paris CHIEF COMPLAINT:  Left frontal hemorrhage  Brief History   31 yoF admitted 9/19 with AMS that began the evening of 9/18, initially thought due to intoxication. Remained unresponsive through am of 9/19.  Patient was brought to the ED where a CT was done and showed a large left sided ICH. Patient was taken to the OR and a mini craniectomy was done.  Patient was brought out to the ICU and PCCM was consulted for vent management. Course complicated by cerebral edema requiring 3% saline limited by hypernatremia. Also complicated by encephalopathy. Tolerating vent wean, but mental status limited extubation. Then 9/25 she developed what appeared to be seizures with gaze deviation and tongue biting. EEG done overnight with results pending and AEDs adjusted. No further seizure seen.   Significant Hospital Events   9/19 Admit/ OR- mini crani 9/20 Decompressive craniectomy ( L)  with skull harvesting in the abdomen 9/21 hypertonic saline started 9/23 hypertonic stopped.  9/25 Seizure. EEG done with no sz activity. AEDs adjusted. 10/1 Trach / PEG 10/2 Trach collar    Consults: date of consult/date signed off & final recs  9/19 NSGY 9/19 PCCM   Procedures (surgical and bedside):  9/19 Intubated 9/19 OR for crani, evac of hematoma>> no change in neuro status post op 9/20 Decompressive craniectomy ( L)  with skull harvesting in the abdomen  Significant Diagnostic Tests:  9/19 [facility]  Large area of hemorrhage in the left basal ganglia and left frontal lobe, 5.8 x 3.5 cm. 15 mm of left-to-right midline shift. 9/19 CTA head: Positive for left MCA M1 large vessel occlusion, and also severe stenosis at the left ICA terminus, superimposed on the relatively large acute left hemisphere intra-axial hemorrhage (estimated blood volume 43 mL). Vasospasm suspected in  the proximal ACAs. 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 2. Negative for intracranial aneurysm, CTA spot sign, or evidence of vascular malformation in association with the acute hemorrhage. 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 3. Stable intracranial mass effect since 0938 hours today, with 16 mm of leftward midline shift and trapping of the right lateral ventricle. 11/29/2017 CT Head Large left MCA infarct is stable in distribution. Interval left frontal craniectomy with mild herniation of left frontal lobe through the defect. Resolved uncal herniation, improved patency of left lateral ventricle, and 6 mm left-to-right midline shift, previously 8 mm. Partial interval dispersion of acute hemorrhage in the left basal ganglia. Subcentimeter density anterior to the prior position of blood products possibly representing retracted clot or interval hemorrhage, attention at follow-up recommended. CT 9/24: 1. Left MCA distribution infarction is stable in distribution. Mild increased edema of the infarct with increased herniation of the left frontal lobe via the left frontal craniectomy. 2. Stable 6 mm left-to-right midline shift and partial effacement of left lateral ventricle. 3. Stable small hematoma within the left basal ganglia. 4. No new acute intracranial abnormality identified.  Micro Data: 9/19 MRSA PCR >> Negative 9/19 Sputum Culture >> normal flora 9/22 Blood x 2 >> negative 9/22 UA >> Normal 9/22 Sputum >> few candida dubliniensis 12/13/2017 blood cultures x2>> 12/13/2017 urine culture>> 12/13/2017 sputum culture>>  Antimicrobials:  9/19 Cefazolin (pre-op) 9/20  Subjective:  Continues to tolerate  ATC well.  No overnight events.  Objective   Blood pressure 138/84, pulse 80, temperature 100 F  (37.8 C), temperature source Rectal, resp. rate 20, height 5\' 1"  (1.549 m), weight 73 kg, SpO2 100 %.    FiO2 (%):  [28 %] 28 %   Intake/Output Summary (Last 24 hours) at 12/15/2017 0934 Last data filed at 12/15/2017 0000 Gross per 24 hour  Intake 1400.73 ml  Output 1550 ml  Net -149.27 ml   Filed Weights   12/13/17 0213 12/14/17 0440 12/15/17 0414  Weight: 72.9 kg 76.2 kg 73 kg    Intake/Output Summary (Last 24 hours) at 12/15/2017 0934 Last data filed at 12/15/2017 0000 Gross per 24 hour  Intake 1400.73 ml  Output 1550 ml  Net -149.27 ml   Examination:  General: Middle aged female, on TC, NAD HEENT: [place]/AT, PERRL, EOM-I and MMM, trach in place Neuro: Moves left side spontaneously, tracking with eyes appropriately CV: RRR, Nl S1/S2 and -M/R/G PULM: even/non-labored, lungs bilaterally rhonchi in bases ZO:XWRU, non-tender, bsx4 active  Extremities: warm/dry, no edema  Skin: no rashes or lesions  Resolved Hospital Problem list     Assessment & Plan:   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 Now status post hemicraniotomy for evacuation. Complicated by cerebral edema requiring hypertonic saline, which was stopped 9/28. She remains slow to improve.  - Continue to hold sedation - Expect slow recovery  Severe hypertension with adequate control at present.  - Continue current medication for HTN, no need for change  Acute respiratory insufficiency in the setting of massive IPH - s/p trach placement 10/1. - Maintain on TC as tolerated - OK to remove trach sutures tomorrow 10/8 - CXR to PRN at this point - Ok to move to floor from PCCM standpoint, PCCM will follow twice weekly for trach management  Central fever - improving. Cultures are negative.  Leukocytosis - resolved. Likely neuro driven. PCT 0.13, remains reassuring.  - continue to monitor off abx  Anemia of acute illness. - Transfuse for Hgb < 7  Protein calorie  malnutrition with tube feed diarrhea. - Continue TF   Possible ETOH abuse - Thiamine and folate and MVI  Hx ADHD/ depression / anxiety - Adderall on hold  Disposition / Summary of Today's Plan 12/15/17   Continue ATC as tolerated. OK to transfer out of ICU from [facility] standpoint. PCCM will follow twice per week, please call if needs arise sooner.    Diet: TF  Pain/Anxiety/Delirium protocol: None DVT prophylaxis: SCD's GI prophylaxis: pepcid  Hyperglycemia protocol: None Mobility: BR Code Status: full Family Communication: 9/30 spoke with son, reviewed patients overall state of health.  He is very articulate in his understanding of her illness.   He requests that we continue full support / proceed with trach as he wishes to give his little sister as much time with her mother as possible (she is 7).   10/7 no family available.  Labs   CBC: Recent Labs  Lab 12/10/17 0319 12/11/17 0230 12/12/17 0339 12/14/17 0256 12/14/17 1445  WBC 14.5* 16.4* 13.5* 9.4 9.5  NEUTROABS  --   --   --  5.9  --   HGB 9.6* 8.8* 8.5* 7.3* 8.0*  HCT 30.9* 28.4* 28.0* 24.4* 26.0*  MCV 81.7 81.6 81.9 82.2 81.5  PLT 284 326 360 393 398   Basic Metabolic Panel: Recent Labs  Lab 12/09/17 0332 12/10/17 0319 12/10/17 1025 12/11/17 0911 12/12/17 0339 12/13/17  1610 12/14/17 0256 12/14/17 1445  NA 143 137 139 138 141 140  --  140  K 4.1 4.1 3.3* 3.8 3.7 3.3*  --   --   CL 107 102 103 103 106 106  --   --   CO2 23 23 23 22 24  21*  --   --   GLUCOSE 88 96 136* 103* 104* 167*  --   --   BUN 23* 20 18 16 20 17   --   --   CREATININE 0.64 0.68 0.68 0.62 0.66 0.71  --   --   CALCIUM 9.5 9.2 9.0 8.9 9.0 8.9  --   --   MG 2.2  --   --   --   --   --  2.2  --   PHOS  --   --   --   --   --   --  4.2  --    GFR: Estimated Creatinine Clearance: 83.7 mL/min (by C-G formula based on SCr of 0.71 mg/dL). Recent Labs  Lab 12/11/17 0230 12/12/17 0339 12/14/17 0256 12/14/17 1445  WBC 16.4* 13.5* 9.4 9.5     Liver Function Tests: No results for input(s): AST, ALT, ALKPHOS, BILITOT, PROT, ALBUMIN in the last 168 hours. No results for input(s): LIPASE, AMYLASE in the last 168 hours. No results for input(s): AMMONIA in the last 168 hours. ABG    Component Value Date/Time   PHART 7.444 12/08/2017 0414   PCO2ART 36.7 12/08/2017 0414   PO2ART 109 (H) 12/08/2017 0414   HCO3 24.6 12/08/2017 0414   TCO2 27 12/05/2017 1216   ACIDBASEDEF 6.0 (H) 11/29/2017 0310   O2SAT 98.2 12/08/2017 0414    Coagulation Profile: Recent Labs  Lab 12/09/17 0332  INR 1.13   Cardiac Enzymes: No results for input(s): CKTOTAL, CKMB, CKMBINDEX, TROPONINI in the last 168 hours. HbA1C: Hgb A1c MFr Bld  Date/Time Value Ref Range Status  11/28/2017 08:32 AM 5.8 (H) 4.8 - 5.6 % Final    Comment:    (NOTE) Pre diabetes:          5.7%-6.4% Diabetes:              >6.4% Glycemic control for   <7.0% adults with diabetes    CBG: Recent Labs  Lab 12/14/17 1127 12/14/17 1557 12/14/17 2323 12/15/17 0335 12/15/17 0744  GLUCAP 107* 126* 124* 87 104*    Rutherford Guys, PA - [facility] Pager: [phone]  or [phone] 12/15/2017, 9:43 AM

## 2017-12-15 NOTE — Progress Notes (Signed)
NEUROHOSPITALISTS STROKE TEAM - DAILY PROGRESS NOTE   SUBJECTIVE No family is at bedside.   Patient with trach collar, off ventilator, eyes open, comfortable. No events overnight. Still has global aphasia and right neglect but right hemiplegia improved.    OBJECTIVE Most recent Vital Signs: Vitals:   12/15/17 1000 12/15/17 1100 12/15/17 1106 12/15/17 1200  BP: (!) 159/95 133/77 133/77 132/85  Pulse: 83 89 90 92  Resp: (!) 22 (!) 25 (!) 24 (!) 21  Temp:      TempSrc:      SpO2: 100% 100% 100% 99%  Weight:      Height:       CBG (last 3)  Recent Labs    12/15/17 0335 12/15/17 0744 12/15/17 1148  GLUCAP 87 104* 136*    Physical Exam unchanged  Temp:  [98.8 F (37.1 C)-100 F (37.8 C)] 98.8 F (37.1 C) (10/07 0800) Pulse Rate:  [73-103] 92 (10/07 1200) Resp:  [16-27] 21 (10/07 1200) BP: (98-159)/(69-106) 132/85 (10/07 1200) SpO2:  [98 %-100 %] 99 % (10/07 1200) FiO2 (%):  [28 %] 28 % (10/07 1200) Weight:  [73 kg] 73 kg (10/07 0414)   HEENT-status post left frontoparietal craniotomy with wound clean dry and stitches intact and status post tracheostomy on 11/28/17  cardiovascular- S1-S2 audible, pulses palpable throughout   Lungs- low breath sounds bilaterally, Status post tracheostomy on vent with nonlabored breathing Abdomen-hypoactive bowel sounds  musculoskeletal-no joint tenderness, deformity or swelling Skin-warm and dry Neurological Exam :  Patient is awake, globally aphasic, and does not respond to commands.  Eyes open, left gaze preference, able to cross midline, PERRL. Not blinking to visual threat on the right. Right facial droop. Tongue protrusion not cooperative. LUE 4/5 and able to against gravity. LLE withdraw to pain 2+/5. RUE 2+/5 on pain stimuli and RLE 2+/5 to pain. B/l babinski positive. Sensation, coordination and gait not tested.    Lipid Panel    Component Value Date/Time   CHOL 138 11/28/2017  0832   TRIG 414 (H) 11/30/2017 1658   HDL 42 11/28/2017 0832   CHOLHDL 3.3 11/28/2017 0832   VLDL 32 11/28/2017 0832   LDLCALC 64 11/28/2017 0832   HgbA1C  Lab Results  Component Value Date   HGBA1C 5.8 (H) 11/28/2017    Urine Drug Screen:      Component Value Date/Time   LABOPIA NONE DETECTED 11/27/2017 1415   COCAINSCRNUR NONE DETECTED 11/27/2017 1415   LABBENZ NONE DETECTED 11/27/2017 1415   AMPHETMU POSITIVE (A) 11/27/2017 1415   THCU NONE DETECTED 11/27/2017 1415   LABBARB NONE DETECTED 11/27/2017 1415    Alcohol Level:  No results for input(s): ETH in the last 168 hours.    IMAGING  Ct Angio Head W Or Wo Contrast  IMPRESSION:  1. Positive for left MCA M1 large vessel occlusion, and also severe stenosis at the left ICA terminus, superimposed on the relatively large acute left hemisphere intra-axial hemorrhage (estimated blood volume 43 mL). Vasospasm suspected in the proximal ACAs. 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  2. Negative for intracranial aneurysm, CTA spot sign, or evidence of vascular malformation in association with the acute hemorrhage. 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  3. Stable intracranial mass effect since 0938 hours today, with 16 mm of leftward midline shift and trapping of the right lateral ventricle.  Ct Head Wo Contrast  11/28/2017 IMPRESSION:  1. Stable when compared to yesterday.  2. Left MCA territory infarct with evacuated hematoma. Midline shift measures 8 mm.  3. 2-3 mm subdural hematoma along the right cerebral convexity without interval increase.    Ct Head Wo Contrast  11/27/2017 IMPRESSION:  1. Interval LEFT frontal craniotomy for basal ganglia hematoma evacuation, small amount of residual blood products and regional edema. 7 mm  residual LEFT to RIGHT midline shift, resolved RIGHT ventricular entrapment.  2. Trace RIGHT holo hemispheric and tentorial subdural hematomas. Global edema.    Ct Head Wo Contrast 11/27/2017 IMPRESSION:  Large area of hemorrhage in the left basal ganglia and left frontal lobe, 5.8 x 3.5 cm. 15 mm of left-to-right midline shift.   Mr Brain Wo Contrast 11/28/2017 IMPRESSION:  1. Postoperative changes from previous left frontal craniotomy for evacuation of left basal ganglia hematoma. Residual blood products within the evacuation cavity with persistent surrounding vasogenic edema and regional mass effect. Associated 8-9 mm of right-to-left midline shift with mild left uncal herniation similar to previous. No hydrocephalus or ventricular trapping.  2. Underlying evolving large acute ischemic left MCA territory infarct involving the majority of the left MCA territory.  3. Trace right cerebral and tentorial subdural hematoma without mass effect.  4. Right sphenoid sinusitis.  Ct Head Wo Contrast 11/29/2017 IMPRESSION:  1. Large left MCA infarct is stable in distribution.  2. Interval left frontal craniectomy with mild herniation of left frontal lobe through the defect.  3. Resolved uncal herniation, improved patency of left lateral ventricle, and 6 mm left-to-right midline shift, previously 8 mm.  4. Partial interval dispersion of acute hemorrhage in the left basal ganglia. Subcentimeter density anterior to the prior position of blood products possibly representing retracted clot or interval hemorrhage, attention at follow-up recommended.    Ct Head Wo Contrast 12/02/2017 IMPRESSION:  1. Left MCA distribution infarction is stable in distribution. Mild increased edema of the infarct with increased herniation of the left frontal lobe via the left frontal craniectomy.  2. Stable 6 mm left-to-right midline shift and partial effacement of left lateral ventricle.  3. Stable small hematoma within the  left basal ganglia.  4. No new acute intracranial abnormality identified.    ASSESSMENT/PLAN Stroke:  Acute Large left MCA infarct with large hemorrhagic transformation with uncal herniation s/ p hemicraniotomy for evacuation of hematoma and increased ICP due to left MCA occlusion, embolic pattern, source unclear  Resultant - global aphasia with right hemiparesis  CTA head & neck : Positive for left MCA M1 large vessel occlusion, and also severe stenosis at the left ICA terminus  CT of the brain: Large area of hemorrhage in the left basal ganglia and left frontal lobe. 15 mm of left-to-right midline shift.  MRI of the brain: Post craniotomy 8-9 mm of right-to-left midline shift with mild left uncal herniation similar to previous. Underlying evolving large acute ischemic left MCA territory infarct involving the majority of the left MCA territory.  Repeat Head CT post evacuation 9/20 - Left MCA territory infarct with evacuated hematoma. Midline shift measures 8 mm.   UDS - positive for amphetamines ( Pt takes Adderal at home)  2D Echocardiogram EF 60 to 65%  LE venous doppler negative for DVT  Hypercoagulable labs negative except high ESR and CRP  Consider TEE if further neuro improved   LDL 64  HgbA1c 5.8  VTE: Heparin subq  Started ASA 81 on 12/04/17  Therapy recommendations: pending  Disposition: Pending   Cerebral edema, resolved  CT head midline shift 72m with uncal herniation  Repeat CT head s/p hematoma evacuation - midline shift 861m S/p hemicrani 9/20  Repeat CT 9/21 - midline shift 80m3mOn keppra for seizure prevention  CT repeat 12/02/2017 stable midline shift, large left MCA infarct, residual hematoma  3% saline d/c'ed on 12/06/17  Acute respiratory failure in the setting of massive IPH  Status post tracheostomy on 12/09/17  Well on trach collar  Continue trach care per protocol  Intermittent chest x-rays  Chest x-ray done on 12/14/2017 shows no  acute disease   Seizure  Episode of stiffness, tongue biting  Overnight EEG no seizure  Increased keppra from 500 to 1000m87md  No more seizure since  Seizure precautions  Hypertension emergency  SBP stable  BP goal <140   On norvasc, HCTZ and lisinopril and labetalol   Anemia  Hb 11.8 on admission  Hemoglobin 7.3-->8.0  Received one unit PRBC during this admission    Continue to monitor  Iron study iron 36 and ferritin 66  Pt currently on  iron pills  PRBC transfusion if HGb < 7.0  Leukocytosis- resolved   WBC 9.4-->9.5  No fever   cultures neg  Continue monitoring  Nutrition  PEG tube placed on 12/09/2017 for tube feeding  Accu-Chek every 4 hours while on tube feeding  On free water   Other Stroke Risk Factors  ETOH use   Other Active Problems  ADHD - on Adderell at home  Anxiety  Depression  Hypokalemia  Hospital day # 18  This patient is critically ill due to large left MCA infarct, uncal herniation s/p hemicrani, large hemorrhagic conversion, intubated and at significant risk of neurological worsening, death form stroke recurrence, hematoma expansion, cerebral edema and uncal herniation, seizure. This patient's care requires constant monitoring of vital signs, hemodynamics, respiratory and cardiac monitoring, review of multiple databases, neurological assessment, discussion with family, other specialists and medical decision making of high complexity. I spent 35 minutes of neurocritical care time in the care of this patient.  JindRosalin Hawking PhD Stroke Neurology 12/15/2017 1:35 PM         To contact Stroke Continuity provider, please refer to [email or web address], contact General Neurology

## 2017-12-15 NOTE — Evaluation (Signed)
Speech Language Pathology Evaluation Patient Details Name: Tammie Miles MRN: [id number] DOB: Jan 03, 1976 Today's Date: 12/15/2017 Time: 1500-1510 SLP Time Calculation (min) (ACUTE ONLY): 10 min  Problem List:  Patient Active Problem List   Diagnosis Date Noted  . Altered mental status   . Central venous catheter in place   . 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 Hypertensive emergency 12/02/2017  . Anemia 12/02/2017  . Leukocytosis 12/02/2017  . Acute intra-cranial hemorrhage (HCC)   . Respiratory failure (HCC)   . ICH (intracerebral hemorrhage) (HCC) 11/27/2017  . Intracranial hemorrhage (HCC) 11/27/2017   Past Medical History:  Past Medical History:  Diagnosis Date  . ADHD   . Anxiety   . Depression   . Hypertension     HPI:  89 yoF with PMH significant for HTN, anxiety, ADHD, admitted 9/19 with large L hemorrhagic basal ganglia with 15 mm shift with early herniation s/p L frontotemporal crani 9/19 for evacuation of hematoma. ETT 9/19; 9/20 returned to OR for decompressive craniectomy ; 10/1 trach/PEG;  transitioned to TC and off vent 10/5.  CT 9/24: 1. Left MCA distribution infarction is stable in distribution. Mild increased edema of the infarct with increased herniation of the left frontal lobe via the left frontal craniectomy. 2. Stable 6 mm left-to-right midline shift and partial effacement of left lateral ventricle. 3. Stable small hematoma within the left basal ganglia.    Assessment / Plan / Recommendation Clinical Impression  Pt awake and alert, globally aphasic, no attempts to communicate/interact with environment intentionally.  Continues with left gaze preference; opens eyes to name being called; does not follow commands.  May have stroked her friend's hand in response to hand-holding but was not repeatable.  Pt on trach collar - she will benefit from PMV  to establish upper airway and engage vocal folds for coughing and phonation, even if involuntary.  She is not ready for a clinical swallow evaluation, but SLP will follow acutely for basic communication and assess for readiness for swallowing as she progresses.     SLP Assessment  SLP Recommendation/Assessment: Patient needs continued Speech Lanaguage Pathology Services SLP Visit Diagnosis: Cognitive communication deficit (R41.841)    Follow Up Recommendations  Other (comment)(tba)    Frequency and Duration min 2x/week  2 weeks      SLP Evaluation Cognition  Overall Cognitive Status: Impaired/Different from baseline Arousal/Alertness: Awake/alert Orientation Level: Other (comment)(unable to assess) Attention: Focused Focused Attention: Impaired       Comprehension  Auditory Comprehension Overall Auditory Comprehension: Impaired Yes/No Questions: (no responses) Commands: Impaired One Step Basic Commands: 0-24% accurate Visual Recognition/Discrimination Discrimination: Not tested Reading Comprehension Reading Status: Not tested    Expression Expression Primary Mode of Expression: (no attempts to communicate) Verbal Expression Overall Verbal Expression: Impaired Initiation: Impaired   Oral / Motor  Oral Motor/Sensory Function Overall Oral Motor/Sensory Function: Moderate impairment Facial ROM: Reduced right Facial Symmetry: Abnormal symmetry right Lingual ROM: Other (Comment)(unable to assess) Motor Speech Overall Motor Speech: Impaired(nonverbal)   GO                    Tammie Miles Tammie Miles 12/15/2017, 3:43 PM  Tammie Vida [name] [name], MA CCC/SLP Acute Rehabilitation Services Office number [phone] Pager [phone]

## 2017-12-15 NOTE — NC FL2 (Signed)
[place] LEVEL OF CARE SCREENING TOOL     IDENTIFICATION  Patient Name: Tammie Miles Birthdate: 06-05-1975 Sex: female Admission Date (Current Location): 11/27/2017  [facility] and [place] Number:  Herbalist and Address:  [facility], [place] [address] [place], [place] [address]      Provider Number: [id number]  Attending Physician Name and Address:  Rosalin Hawking, MD  Relative Name and Phone Number:  Earl Lagos; son; [phone]    Current Level of Care: Hospital Recommended Level of Care: [place] Prior Approval Number:    Date Approved/Denied:   PASRR Number: pending  Discharge Plan: SNF    Current Diagnoses: Patient Active Problem List   Diagnosis Date Noted  . Altered mental status   . Central venous catheter in place   . 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 Hypertensive emergency 12/02/2017  . Anemia 12/02/2017  . Leukocytosis 12/02/2017  . Acute intra-cranial hemorrhage ([place])   . Respiratory failure ([place])   . ICH (intracerebral hemorrhage) ([place]) 11/27/2017  . Intracranial hemorrhage ([place]) 11/27/2017    Orientation RESPIRATION BLADDER Height & Weight     Self  Tracheostomy, O2(61m cuffed 28%; 5L O2) Incontinent, External catheter Weight: 160 lb 15 oz (73 kg) Height:  5' 1"  (154.9 cm)  BEHAVIORAL SYMPTOMS/MOOD NEUROLOGICAL BOWEL NUTRITION STATUS      Incontinent(rectal pouch) Diet, Feeding tube(see discharge summary; Pt has PEG)  AMBULATORY STATUS COMMUNICATION OF NEEDS Skin   Total Care   Surgical wounds(incision left side of head; incision on abdomen)                       Personal Care Assistance Level of Assistance  Bathing, Feeding, Dressing, Total care Bathing Assistance: Maximum assistance Feeding assistance: Maximum assistance Dressing Assistance: Maximum  assistance Total Care Assistance: Maximum assistance   Functional Limitations Info  Sight, Hearing, Speech Sight Info: Adequate Hearing Info: Adequate Speech Info: Impaired    SPECIAL CARE FACTORS FREQUENCY  PT (By licensed PT), OT (By licensed OT)     PT Frequency: 5x week OT Frequency: 5x week            Contractures Contractures Info: Not present    Additional Factors Info  Code Status, Allergies Code Status Info: Full Code Allergies Info: No Known Allergies           Current Medications (12/15/2017):  This is the current hospital active medication list Current Facility-Administered Medications  Medication Dose Route Frequency Provider Last Rate Last Dose  . 0.9 %  sodium chloride infusion   Intravenous PRN XRosalin Hawking MD 10 mL/hr at 12/15/17 1355 250 mL at 12/15/17 1355  . acetaminophen (TYLENOL) tablet 650 mg  650 mg Oral Q4H PRN SErline Levine MD   650 mg at 12/10/17 0458  . amantadine (SYMMETREL) 50 MG/5ML solution 100 mg  100 mg Per Tube BID AKipp Brood MD   100 mg at 12/15/17 0937  . amLODipine (NORVASC) tablet 10 mg  10 mg Oral Daily Ollis, Brandi L, NP   10 mg at 12/15/17 0937  . aspirin chewable tablet 81 mg  81 mg Per Tube Daily XRosalin Hawking MD   81 mg at 12/15/17 03016 . chlorhexidine gluconate (MEDLINE KIT) (PERIDEX) 0.12 % solution 15 mL  15 mL Mouth Rinse BID SErline Levine MD   15 mL at 12/15/17  0800  . docusate (COLACE) 50 MG/5ML liquid 100 mg  100 mg Per Tube BID Jennelle Human B, NP   100 mg at 12/15/17 0937  . famotidine (PEPCID) 40 MG/5ML suspension 20 mg  20 mg Per Tube BID Jennelle Human B, NP   20 mg at 12/15/17 0937  . feeding supplement (JEVITY 1.2 CAL) liquid 360 mL  360 mL Per Tube QID Kipp Brood, MD 360 mL/hr at 12/14/17 0800 360 mL at 12/15/17 1531  . ferrous sulfate tablet 325 mg  325 mg Oral BID Rosalin Hawking, MD   325 mg at 12/15/17 0355  . folic acid (FOLVITE) tablet 1 mg  1 mg Per Tube Daily Harvel Quale, RPH   1 mg at  12/15/17 9741  . free water 140 mL  140 mL Per Tube QID Kipp Brood, MD   140 mL at 12/15/17 1531  . heparin injection 5,000 Units  5,000 Units Subcutaneous Q8H Rosalin Hawking, MD   5,000 Units at 12/15/17 1344  . hydrochlorothiazide (HYDRODIURIL) tablet 25 mg  25 mg Oral Daily Kris Mouton, RPH   25 mg at 12/15/17 6384  . labetalol (NORMODYNE) tablet 300 mg  300 mg Oral TID Garvin Fila, MD   300 mg at 12/15/17 1531  . labetalol (NORMODYNE,TRANDATE) injection 10-40 mg  10-40 mg Intravenous Q10 min PRN Garvin Fila, MD   40 mg at 12/09/17 1543  . levETIRAcetam (KEPPRA) IVPB 1000 mg/100 mL premix  1,000 mg Intravenous Q12H Rosalin Hawking, MD 400 mL/hr at 12/15/17 1356 1,000 mg at 12/15/17 1356  . lisinopril (PRINIVIL,ZESTRIL) tablet 20 mg  20 mg Oral BID Rosalin Hawking, MD   20 mg at 12/15/17 5364  . LORazepam (ATIVAN) injection 2 mg  2 mg Intravenous Once PRN Rosalin Hawking, MD      . MEDLINE mouth rinse  15 mL Mouth Rinse 10 times per day Erline Levine, MD   15 mL at 12/15/17 1600  . minoxidil (LONITEN) tablet 2.5 mg  2.5 mg Oral Daily Agarwala, Einar Grad, MD   2.5 mg at 12/15/17 0938  . multivitamin liquid 15 mL  15 mL Per Tube Daily Erline Levine, MD   15 mL at 12/15/17 0937  . psyllium (HYDROCIL/METAMUCIL) packet 1 packet  1 packet Oral BID Kipp Brood, MD   1 packet at 12/15/17 [phone]  . senna-docusate (Senokot-S) tablet 1 tablet  1 tablet Oral BID Erline Levine, MD   1 tablet at 12/15/17 [phone]  . sodium chloride flush (NS) 0.9 % injection 10-40 mL  10-40 mL Intracatheter Q12H Rush Farmer, MD   10 mL at 12/15/17 0939  . sodium chloride flush (NS) 0.9 % injection 10-40 mL  10-40 mL Intracatheter PRN Rush Farmer, MD   10 mL at 12/05/17 0249  . thiamine (VITAMIN B-1) tablet 100 mg  100 mg Per Tube Daily Harvel Quale, RPH   100 mg at 12/15/17 4825     Discharge Medications: Please see discharge summary for a list of discharge medications.  Relevant Imaging Results:  Relevant Lab  Results:   Additional Information SS[id number] [place], [place]

## 2017-12-16 DIAGNOSIS — Z9289 Personal history of other medical treatment: Secondary | ICD-10-CM

## 2017-12-16 LAB — BASIC METABOLIC PANEL
Anion gap: 9 (ref 5–15)
BUN: 16 mg/dL (ref 6–20)
CHLORIDE: 102 mmol/L (ref 98–111)
CO2: 27 mmol/L (ref 22–32)
Calcium: 8.9 mg/dL (ref 8.9–10.3)
Creatinine, Ser: 0.75 mg/dL (ref 0.44–1.00)
GFR calc Af Amer: >60 mL/min (ref >60)
GFR calc non Af Amer: >60 mL/min (ref >60)
GLUCOSE: 107 mg/dL — AB (ref 70–99)
POTASSIUM: 3.8 mmol/L (ref 3.5–5.1)
Sodium: 138 mmol/L (ref 135–145)

## 2017-12-16 LAB — CBC
HCT: 26.8 % — ABNORMAL LOW (ref 36.0–46.0)
HEMOGLOBIN: 8.3 g/dL — AB (ref 12.0–15.0)
MCH: 25.1 pg — AB (ref 26.0–34.0)
MCHC: 31 g/dL (ref 30.0–36.0)
MCV: 81 fL (ref 80.0–100.0)
Platelets: 454 10*3/uL — ABNORMAL HIGH (ref 150–400)
RBC: 3.31 MIL/uL — AB (ref 3.87–5.11)
RDW: 20.2 % — ABNORMAL HIGH (ref 11.5–15.5)
WBC: 10.1 10*3/uL (ref 4.0–10.5)

## 2017-12-16 LAB — GLUCOSE, CAPILLARY
GLUCOSE-CAPILLARY: 110 mg/dL — AB (ref 70–99)
GLUCOSE-CAPILLARY: 142 mg/dL — AB (ref 70–99)
GLUCOSE-CAPILLARY: 148 mg/dL — AB (ref 70–99)
GLUCOSE-CAPILLARY: 153 mg/dL — AB (ref 70–99)
GLUCOSE-CAPILLARY: 96 mg/dL (ref 70–99)
Glucose-Capillary: 102 mg/dL — ABNORMAL HIGH (ref 70–99)
Glucose-Capillary: 94 mg/dL (ref 70–99)

## 2017-12-16 MED ORDER — INFLUENZA VAC SPLIT QUAD 0.5 ML IM SUSY
0.5000 mL | PREFILLED_SYRINGE | INTRAMUSCULAR | Status: AC
  Administered 2017-12-17: 0.5 mL via INTRAMUSCULAR
  Filled 2017-12-16: qty 0.5

## 2017-12-16 MED ORDER — LEVETIRACETAM 100 MG/ML PO SOLN
1000.0000 mg | Freq: Two times a day (BID) | ORAL | Status: DC
  Administered 2017-12-16 – 2018-01-08 (×47): 1000 mg
  Filled 2017-12-16 (×51): qty 10

## 2017-12-16 NOTE — Progress Notes (Addendum)
NEUROHOSPITALISTS STROKE TEAM - DAILY PROGRESS NOTE   SUBJECTIVE No family is at bedside.   Moved to floor yesterday. Await therapy recommendations. Remains with R HP and global aphasia.   OBJECTIVE Most recent Vital Signs: Vitals:   12/16/17 0427 12/16/17 0500 12/16/17 0708 12/16/17 0828  BP: (!) 135/118   (!) 161/91  Pulse: 97  96 93  Resp: 18  18 20   Temp: 99.8 F (37.7 C)   97.6 F (36.4 C)  TempSrc: Oral   Oral  SpO2: 99%  98% 99%  Weight:  79.6 kg    Height:       CBG (last 3)  Recent Labs    12/15/17 2045 12/15/17 2340 12/16/17 0425  GLUCAP 92 158* 102*    Physical Exam  HEENT-status post left frontoparietal craniotomy with wound clean dry and stitches intact  Cardiovascular- S1-S2 audible, pulses palpable throughout   Lungs- low breath sounds bilaterally, Status post tracheostomy with trach collar, nonlabored breathing Abdomen- bowel sounds present musculoskeletal-no joint tenderness, deformity or swelling Skin-warm and dry Neurological Exam :  Patient is sleepy, awakens with stimulation, globally aphasic, and does not respond to commands.  Eyes open, left gaze preference, able to cross midline, PERRL. Not blinking to visual threat on the right. Right facial droop. Tongue protrusion not cooperative. LUE 4/5 and able to against gravity. LLE withdraw to pain 4/5. RUE 2/5 on pain stimuli and RLE 1/5 to pain. B/l babinski positive. Sensation, coordination and gait not tested.   IMAGING Ct Angio Head W Or Wo Contrast  Addendum Date: 11/27/2017   ADDENDUM REPORT: 11/27/2017 11:17 ADDENDUM: Study discussed by telephone with Neurosurgeon Dr. Consuella Lose on 11/27/2017 at 1108 hours. Electronically Signed   By: Genevie Ann M.D.   On: 11/27/2017 11:17   Result Date: 11/27/2017 CLINICAL DATA:  42 year old female found unresponsive with relatively large intra-axial hemorrhage in the left hemisphere on noncontrast head CT.  EXAM: CT ANGIOGRAPHY HEAD TECHNIQUE: Multidetector CT imaging of the head was performed using the standard protocol during bolus administration of intravenous contrast. Multiplanar CT image reconstructions and MIPs were obtained to evaluate the vascular anatomy. CONTRAST:  26m ISOVUE-370 IOPAMIDOL (ISOVUE-370) INJECTION 76% COMPARISON:  Head CT without contrast 0938 hours today. FINDINGS: Posterior circulation: Mildly dominant distal left vertebral artery. Patent vertebral arteries to the vertebrobasilar junction without stenosis. Patent left PICA and dominant appearing right AICA origins. Patent basilar artery without stenosis. Mild tortuosity at the basilar tip. SCA and PCA origins are within normal limits. Posterior communicating arteries are diminutive or absent. Bilateral PCA branches are within normal limits. 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 Smooth tapering of the vessel. No right ICA siphon atherosclerosis or stenosis identified. Patent right ICA terminus. Normal right ophthalmic artery origin. Similar but lesser caliber variation in the distal left ICA which measures up to 6 millimeters diameter (coronal series 7, images 88 and 89. No left ICA siphon atherosclerosis, but there is moderate to severe vessel wall irregularity and stenosis in the supraclinoid segment (series 9, image 92 best demonstrated series 9, images 92-95. Despite this the left ICA terminus is patent. Both ACA A1 segments are diminutive but patent. There is mass effect on the left scratched at there is mass effect on the bilateral ACA branches which appear patent and within normal limits. The right MCA M1 segment, right MCA trifurcation, and right MCA branches are  within normal limits. The left MCA M1 segment is occluded about 6 millimeters beyond its origin (series 8, image 16) with poor  reconstituted enhancement in the left MCA branches. No CTA spot sign is identified. No abnormal increased vascularity in the area of left hemisphere hematoma. Venous sinuses: Early contrast timing, but grossly patent superior sagittal sinus. Anatomic variants: Dominant distal left vertebral artery. Other findings: Estimated left hemisphere intra-axial hemorrhage volume 43 milliliters (60 x 33 x 43 millimeters AP by transverse by CC). No intraventricular or extra-axial extension of blood is evident. Intracranial mass effect appears stable with rightward midline shift up to 16 millimeters. Substantial mass effect on the lateral ventricles redemonstrated. The right lateral ventricle appears trapped. Review of the MIP images confirms the above findings IMPRESSION: 1. Positive for left MCA M1 large vessel occlusion, and also severe stenosis at the left ICA terminus, superimposed on the relatively large acute left hemisphere intra-axial hemorrhage (estimated blood volume 43 mL). Vasospasm suspected in the proximal ACAs. 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 2. Negative for intracranial aneurysm, CTA spot sign, or evidence of vascular malformation in association with the acute hemorrhage. 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 3. Stable intracranial mass effect since 0938 hours today, with 16 mm of leftward midline shift and trapping of the right lateral ventricle. Study briefly discussed by telephone with Dr. Venora Maples in the ED on 11/27/2017 at 10:55. He advises the patient has now been taken to the neuro operating room, and I am now attempting to contact the Neurosurgeon regarding these findings. Electronically Signed: By: Genevie Ann M.D. On: 11/27/2017 10:58   Ct Head Wo Contrast  Result Date: 12/02/2017 CLINICAL DATA:  42  y/o  F; intracranial hemorrhage for follow-up. EXAM: CT HEAD WITHOUT CONTRAST TECHNIQUE: Contiguous axial images were obtained from the base of the skull through the vertex without intravenous contrast. COMPARISON:  11/29/2017 CT head. FINDINGS: Brain: Stable distribution of left MCA infarction. Mild interval increase in edema a mildly increased herniation of the left frontal lobe the craniectomy. Stable 6 mm left-to-right midline shift and partial effacement of the left lateral ventricle. Stable hematoma within the left basal ganglia post partial evacuation. No new acute intracranial hemorrhage, stroke, or focal mass effect. Vascular: No hyperdense vessel or unexpected calcification. Skull: Left frontal craniectomy postsurgical changes. Decreased edema and interval resolution of soft tissue air and pneumocephalus. Sinuses/Orbits: Aerosolized secretions in the right sphenoid sinus. Additional visualized paranasal sinuses and the mastoid air cells are normally aerated. Other: None. IMPRESSION: 1. Left MCA distribution infarction is stable in distribution. Mild increased edema of the infarct with increased herniation of the left frontal lobe via the left frontal craniectomy. 2. Stable 6 mm left-to-right midline shift and partial effacement of left lateral ventricle. 3. Stable small hematoma within the left basal ganglia. 4. No new acute intracranial abnormality identified. Electronically Signed   By: Kristine Garbe M.D.   On: 12/02/2017 03:08   Ct Head Wo Contrast  Result Date: 11/29/2017 CLINICAL DATA:  42 y/o  F; stroke for follow-up. EXAM: CT HEAD WITHOUT CONTRAST TECHNIQUE: Contiguous axial images were obtained from the base of the skull through the vertex without intravenous contrast. COMPARISON:  11/27/2017 MRI head.  11/28/2017 CT head. FINDINGS: Brain: Large left MCA infarction is stable in distribution. Interval left frontal craniectomy with partial herniation of the left  frontal lobe through  the craniectomy defect. Interval resolution of uncal herniation, improved patency of the left lateral ventricle, and 6 mm left-to-right midline shift, previously 8 mm. Partial interval dispersion of acute hemorrhage in the left basal ganglia post partial evacuation. There is a subcentimeter density anterior to the prior position of blood products possibly representing retracted clot or interval hemorrhage (series 3, image 19), attention at follow-up recommended. Vascular: No hyperdense vessel or unexpected calcification. Skull: Interval left frontal craniectomy with edema and several foci of air in the overlying scalp as well as skin staples. Sinuses/Orbits: Possible right sphenoid sinus opacification with aerosolized secretions. Normal aeration of the mastoid air cells. Other: None. IMPRESSION: 1. Large left MCA infarct is stable in distribution. 2. Interval left frontal craniectomy with mild herniation of left frontal lobe through the defect. 3. Resolved uncal herniation, improved patency of left lateral ventricle, and 6 mm left-to-right midline shift, previously 8 mm. 4. Partial interval dispersion of acute hemorrhage in the left basal ganglia. Subcentimeter density anterior to the prior position of blood products possibly representing retracted clot or interval hemorrhage, attention at follow-up recommended. Electronically Signed   By: Kristine Garbe M.D.   On: 11/29/2017 06:00   Ct Head Wo Contrast  Result Date: 11/28/2017 CLINICAL DATA:  Stroke follow-up EXAM: CT HEAD WITHOUT CONTRAST TECHNIQUE: Contiguous axial images were obtained from the base of the skull through the vertex without intravenous contrast. COMPARISON:  Yesterday FINDINGS: Brain: Acute infarct in the entirety of the left MCA territory. There was superimposed hematoma requiring craniotomy. Residual blood products are stable at 18 mm on axial slices. There is a very thin subdural hematoma along the right cerebral convexity, stable  at 2 to 3 mm. Cytotoxic edema continues to cause 8 mm of midline shift at the septum pellucidum. No ventriculomegaly. No evidence of new infarct. Vascular: Negative Skull: Unremarkable left frontal craniotomy. Sinuses/Orbits: Small volume secretions in the right sphenoid sinus IMPRESSION: 1. Stable when compared to yesterday. 2. Left MCA territory infarct with evacuated hematoma. Midline shift measures 8 mm. 3. 2-3 mm subdural hematoma along the right cerebral convexity without interval increase. Electronically Signed   By: Monte Fantasia M.D.   On: 11/28/2017 09:20   Ct Head Wo Contrast  Result Date: 11/27/2017 CLINICAL DATA:  Follow up intracranial hemorrhage. Status post LEFT frontotemporal craniotomy. EXAM: CT HEAD WITHOUT CONTRAST TECHNIQUE: Contiguous axial images were obtained from the base of the skull through the vertex without intravenous contrast. COMPARISON:  CT HEAD November 27, 2017 FINDINGS: BRAIN: Interval evacuation LEFT basal ganglia hematoma was small amount of residual blood products with extending along the surgical approach. Decreased surrounding edema. Small amount of LEFT frontal intraparenchymal and extra-axial pneumocephalus. 7 mm LEFT-to-RIGHT midline shift, decreased from 15 mm. Re-expanded LEFT lateral ventricle. Resolved RIGHT ventricular entrapment. No hydrocephalus. No acute large vascular territory infarcts. Trace RIGHT holo hemispheric and tentorial subdural hematoma. Global edema with effaced basal cisterns. VASCULAR: Unremarkable. SKULL/SOFT TISSUES: Status post LEFT frontal craniotomy. LEFT frontal hemangioma. LEFT scalp soft tissue swelling with subcutaneous gas and overlying skin staples. No significant soft tissue swelling. ORBITS/SINUSES: The included ocular globes and orbital contents are normal.Lobulated RIGHT sphenoid sinus mucosal thickening with small air-fluid level. Mastoid air cells are well aerated. OTHER: None. IMPRESSION: 1. Interval LEFT frontal craniotomy  for basal ganglia hematoma evacuation, small amount of residual blood products and regional edema. 7 mm residual LEFT to RIGHT midline shift, resolved RIGHT ventricular entrapment. 2. Trace RIGHT holo hemispheric and tentorial  subdural hematomas. Global edema. Electronically Signed   By: Elon Alas M.D.   On: 11/27/2017 17:02   Ct Head Wo Contrast  Result Date: 11/27/2017 CLINICAL DATA:  Altered mental status EXAM: CT HEAD WITHOUT CONTRAST TECHNIQUE: Contiguous axial images were obtained from the base of the skull through the vertex without intravenous contrast. COMPARISON:  05/22/2009 FINDINGS: Brain: Large area of hemorrhage in the region of the left basal ganglia and extending into the left frontal lobe measuring approximately 5.8 x 3.5 cm. There is 15 mm of left-to-right midline shift. No hydrocephalus. Vascular: No hyperdense vessel or unexpected calcification. Skull: No acute calvarial abnormality. Sinuses/Orbits: Visualized paranasal sinuses and mastoids clear. Orbital soft tissues unremarkable. Other: None IMPRESSION: Large area of hemorrhage in the left basal ganglia and left frontal lobe, 5.8 x 3.5 cm. 15 mm of left-to-right midline shift. Critical Value/emergent results were called by telephone at the time of interpretation on 11/27/2017 at 9:56 am to Dr. Jola Schmidt , who verbally acknowledged these results. Electronically Signed   By: Rolm Baptise M.D.   On: 11/27/2017 09:57   Mr Brain Wo Contrast  Result Date: 11/28/2017 CLINICAL DATA:  Follow-up examination for intracranial hemorrhage. EXAM: MRI HEAD WITHOUT CONTRAST TECHNIQUE: Multiplanar, multiecho pulse sequences of the brain and surrounding structures were obtained without intravenous contrast. COMPARISON:  Prior CT and CTA from earlier the same day. FINDINGS: Brain: Postoperative changes from recent left frontal craniotomy for evacuation of left basal ganglia hematoma again seen. Residual fluid and blood products seen within the  resection cavity with probable superimposed scattered foci of pneumocephalus. Overall, size relatively stable from recent exams. Surrounding vasogenic edema and regional mass effect with attenuation of the adjacent left lateral ventricle. Persistent 8-9 mm of left-to-right midline shift with left uncal herniation (series 15, image 16). Basilar cisterns remain patent. No hydrocephalus or ventricular trapping. Additional small right holo hemispheric subdural hematoma measures up to 3 mm in maximal thickness. Extension along the tentorium again noted. Underlying extensive cytotoxic edema involving the majority of the left MCA territory with involvement of the left frontal, parietal, and temporal occipital lobes, consistent with underlying evolving acute left MCA territory infarct. Associated gyral swelling and edema throughout the area of infarction. No other parenchymal hemorrhage outside the area of basal ganglia hemorrhage. No underlying mass lesion. Remainder of the brain is relatively normal in appearance. Pituitary gland normal. Vascular: Major intracranial vascular flow voids are maintained Skull and upper cervical spine: Craniocervical junction within normal limits. No transtentorial herniation. Upper cervical spine normal. No focal marrow replacing lesion. Sequelae of prior left frontal craniotomy. Skin staples remain in place. Sinuses/Orbits: Globes and orbital soft tissues within normal limits. Right sphenoid sinus disease noted. Paranasal sinuses are otherwise largely clear. Trace right mastoid effusion, of doubtful significance. Other: None. IMPRESSION: 1. Postoperative changes from previous left frontal craniotomy for evacuation of left basal ganglia hematoma. Residual blood products within the evacuation cavity with persistent surrounding vasogenic edema and regional mass effect. Associated 8-9 mm of right-to-left midline shift with mild left uncal herniation similar to previous. No hydrocephalus or  ventricular trapping. 2. Underlying evolving large acute ischemic left MCA territory infarct involving the majority of the left MCA territory. 3. Trace right cerebral and tentorial subdural hematoma without mass effect. 4. Right sphenoid sinusitis. Electronically Signed   By: Jeannine Boga M.D.   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 15 mm of left-to-right midline shift.  MRI of the brain: Post craniotomy 8-9 mm of right-to-left midline shift with mild left uncal herniation similar to previous. Underlying evolving large acute ischemic left MCA territory infarct involving the majority of the left MCA territory.  Repeat Head CT post evacuation 9/20 - Left MCA territory infarct with evacuated hematoma. Midline shift measures 8 mm.   UDS - positive for amphetamines ( Pt takes Adderal at home)  2D Echocardiogram EF 60 to 65%  LE venous doppler negative for DVT  Hypercoagulable labs negative except high ESR and CRP  Consider TEE if she has more functional neuro improvement   LDL 64  HgbA1c 5.8  VTE: Heparin subq  Started ASA 81 on 12/04/17  Therapy recommendations: pending  Disposition: Pending   Cerebral edema, resolved  CT head midline shift 54m with uncal herniation  Repeat CT head s/p hematoma evacuation - midline shift 84m S/p hemicrani 9/20  Repeat CT 9/21 - midline shift 84m80mOn keppra for seizure prevention  CT repeat 12/02/2017 stable midline shift, large left MCA infarct, residual hematoma  3% saline d/c'ed on 12/06/17  Acute  respiratory failure in the setting of massive IPH  Status post tracheostomy on 12/09/17  Well on trach collar  Continue trach care per protocol  Intermittent chest x-rays  Chest x-ray done on 12/14/2017 shows no acute disease   Seizure  Episode of stiffness, tongue biting  Overnight EEG no seizure  Increased keppra from 500 to 1000m53md  No more seizure since  Seizure precautions  Hypertension emergency  BP elevated on admission  BP goal <140   On norvasc, lisinopril and labetalol  Decreased BP meds yest with lowering BPs  Today 120s -160s  Monitor BP   Anemia  Hb 11.8 on admission  Hemoglobin 7.3-->8.0->8.3  Received one unit PRBC during this admission    Continue to monitor  Iron study iron 36 and ferritin 66  Pt currently on  iron pills  PRBC transfusion if HGb < 7.0  Leukocytosis- resolved   WBC 9.4-->9.5->10.1  No fever   cultures neg  Continue monitoring  Nutrition  PEG tube placed on 12/09/2017 for tube feeding  Accu-Chek every 4 hours while on tube feeding  On free water   Other Stroke Risk Factors  ETOH use   Other Active Problems  ADHD - on Adderell at home  Anxiety  Depression  Hypokalemia  Hospital day # 19  BraveN, APRN, ANVP-BC, AGPCNP-BC Advanced Practice Stroke Nurse [facility] Schedule & Pager information 12/16/2017 1:50 PM   ATTENDING NOTE: I reviewed above note and agree with the assessment and plan. Pt was seen and examined.   No family at bedside.  No acute event overnight, neuro stable, no significant change from yesterday.  BP mostly on the lower side, DC HCTZ and minoxidil.  Close monitoring.  Anemia slowly improving.  On tube feeding and trach collar.  Continue aspirin and Keppra.  Social worker working on placement.  JindRosalin Hawking PhD Stroke Neurology 12/16/2017 4:02 PM     To contact Stroke Continuity provider, please refer to  [email or web address] After  hours, contact General Neurology

## 2017-12-16 NOTE — Evaluation (Signed)
Occupational Therapy Evaluation Patient Details Name: Tammie Miles MRN: [id number] DOB: Mar 17, 1975 Today's Date: 12/16/2017    History of Present Illness Patient is a 42 y/o female who was admitted for AMS/unresponsiveness. 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 Returned to OR 9/20 for decompressive crani with left skull harvested in abdomen. 9/25 developed seizures. Intubated 9/19-10/7. s/p trach and peg 10/1. PMH includes HTN, depression, anxiety.    Clinical Impression   Pt supine in bed, transitioned to sitting EOB with total assist +2 pushing towards R side and posterior lean.  Total assist +2 to maintain sitting EOB with no purposeful movement or reactions to stimuli.  Presents with R hemiplegia, L gaze preference (unable to achieve midline), fluctuating tone in R UE (extensor to flaccid), impaired balance (pusing towards R side), poor tolerance to activity and global aphasia.  Pt will benefit from continued OT services while admitted and after dc at [facility] in order to decrease burden of care and optimize independence.  Will continue to follow.     Follow Up Recommendations  Supervision/Assistance - 24 hour;LTACH;SNF    Equipment Recommendations  Hospital bed;Wheelchair (measurements OT);Wheelchair cushion (measurements OT)    Recommendations for Other Services Other (comment)(palliative care)     Precautions / Restrictions Precautions Precautions: Fall;Other (comment) Precaution Comments: trach collar, PEG Required Braces or Orthoses: Other Brace/Splint Other Brace/Splint: B Prevalon Boots Restrictions Weight Bearing Restrictions: No      Mobility Bed Mobility Overal bed mobility: Needs Assistance Bed Mobility: Supine to Sit;Sit to Supine     Supine to sit: Total assist;+2 for physical assistance Sit to supine:  Total assist;+2 for physical assistance   General bed mobility comments: No initiation of movement to get to EOB. Pushing through LUE.  Transfers                 General transfer comment: Not tested    Balance Overall balance assessment: Needs assistance Sitting-balance support: Feet supported;Single extremity supported;No upper extremity supported Sitting balance-Leahy Scale: Zero Sitting balance - Comments: non purposeful push of L UE (during assisted sitting). Requires total A for sitting EOB with heavy right lean due to pushing. Postural control: Right lateral lean;Posterior lean                                 ADL either performed or assessed with clinical judgement   ADL Overall ADL's : Needs assistance/impaired Eating/Feeding: NPO   Grooming: Total assistance;Bed level   Upper Body Bathing: Total assistance;Bed level   Lower Body Bathing: Total assistance;Bed level   Upper Body Dressing : Total assistance;Bed level   Lower Body Dressing: Total assistance;Bed level       Toileting- Clothing Manipulation and Hygiene: Total assistance;Bed level       Functional mobility during ADLs: Total assistance;+2 for physical assistance General ADL Comments: total A for all ADL     Vision   Vision Assessment?: Vision impaired- to be further tested in functional context Additional Comments: no tracking, L gaze preference      Perception     Praxis      Pertinent Vitals/Pain Pain Assessment: Faces Faces Pain Scale: No hurt     Hand Dominance     Extremity/Trunk Assessment Upper Extremity Assessment Upper Extremity Assessment: RUE deficits/detail;LUE deficits/detail RUE Deficits /  Details: fluctating extension tone and flaccid RUE; +clonus LUE Deficits / Details: pushing through LUE in sitting; non-purposeful movement   Lower Extremity Assessment Lower Extremity Assessment: Defer to PT evaluation RLE Deficits / Details: no spontaneous  movement or active movement noted- seems flaccid. No clonus present, does not respond to noxious stimulus. RLE Coordination: decreased gross motor;decreased fine motor LLE Deficits / Details: Spontaneous movement noted LLE, moving into knee flexion in bed and moving ankle sitting EOB.   Cervical / Trunk Assessment Cervical / Trunk Assessment: Other exceptions Cervical / Trunk Exceptions: posterior pevic tilt; no trunk support, pushing through LUE   Communication Communication Communication: Expressive difficulties;Receptive difficulties(non-verbal, aphasic)   Cognition Arousal/Alertness: Lethargic Behavior During Therapy: Flat affect Overall Cognitive Status: Impaired/Different from baseline                                 General Comments: Able to open eyes once sitting EOB, left gaze preference. No withdrawal from pain on right side. Not following any commands today.   General Comments  no purposeful reactions to any stimulus.     Exercises     Shoulder Instructions      Home Living Family/patient expects to be discharged to:: Unsure                                 Additional Comments: pt has a boyfriend and an 58 y/o child (by boyfriend)      Prior Functioning/Environment Level of Independence: Independent        Comments: likely independent, but no family around to get PLOF        OT Problem List: Decreased strength;Decreased range of motion;Impaired balance (sitting and/or standing);Decreased activity tolerance;Impaired vision/perception;Decreased coordination;Decreased cognition;Decreased safety awareness;Decreased knowledge of use of DME or AE;Decreased knowledge of precautions;Cardiopulmonary status limiting activity;Impaired sensation;Impaired tone;Impaired UE functional use;Increased edema      OT Treatment/Interventions: Self-care/ADL training;Neuromuscular education;Therapeutic exercise;DME and/or AE instruction;Therapeutic  activities;Splinting;Cognitive remediation/compensation;Visual/perceptual remediation/compensation;Patient/family education;Balance training    OT Goals(Current goals can be found in the care plan section) Acute Rehab OT Goals Patient Stated Goal: unable to participate with goals OT Goal Formulation: Patient unable to participate in goal setting Time For Goal Achievement: 12/30/17 Potential to Achieve Goals: Fair  OT Frequency: Min 2X/week   Barriers to D/C:            Co-evaluation PT/OT/SLP Co-Evaluation/Treatment: Yes Reason for Co-Treatment: Complexity of the patient's impairments (multi-system involvement);Necessary to address cognition/behavior during functional activity;For patient/therapist safety;To address functional/ADL transfers PT goals addressed during session: Mobility/safety with mobility OT goals addressed during session: ADL's and self-care;Other (comment);Strengthening/ROM(mobility )      AM-PAC PT "6 Clicks" Daily Activity     Outcome Measure Help from another person eating meals?: Total Help from another person taking care of personal grooming?: Total Help from another person toileting, which includes using toliet, bedpan, or urinal?: Total Help from another person bathing (including washing, rinsing, drying)?: Total Help from another person to put on and taking off regular upper body clothing?: Total Help from another person to put on and taking off regular lower body clothing?: Total 6 Click Score: 6   End of Session Nurse Communication: Mobility status  Activity Tolerance: Patient limited by lethargy Patient left: in bed;with call bell/phone within reach;with bed alarm set;with SCD's reapplied;with restraints reapplied  OT Visit Diagnosis: Other abnormalities of gait  and mobility (R26.89);Muscle weakness (generalized) (M62.81);Low vision, both eyes (H54.2);Other symptoms and signs involving cognitive function;Hemiplegia and hemiparesis Hemiplegia -  Right/Left: Right Hemiplegia - caused by: Nontraumatic intracerebral hemorrhage                Time: 1310-1332 OT Time Calculation (min): 22 min Charges:  OT General Charges $OT Visit: 1 Visit OT Evaluation $OT Eval High Complexity: 1 High  Chancy Milroy, OT Acute Rehabilitation Services Pager [phone] Office [phone]   Chancy Milroy 12/16/2017, 4:38 PM

## 2017-12-16 NOTE — Evaluation (Signed)
Physical Therapy Evaluation Patient Details Name: Tammie Miles MRN: [id number] DOB: May 19, 1975 Today's Date: 12/16/2017   History of Present Illness  Patient is a 42 y/o female who was admitted for AMS/unresponsiveness. 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 Returned to OR 9/20 for decompressive crani with left skull harvested in abdomen. 9/25 developed seizures. Intubated 9/19-10/7. s/p trach and peg 10/1. PMH includes HTN, depression, anxiety.   Clinical Impression  Patient presents with right hemiplegia,extensor tone RUE, left gaze preference, global aphasia, inability to follow commands and impaired mobility s/p above. Total A to sit EOB with pt heavy pushing towards right side. No purposeful reactions to any stimulus today. Responds to noxious stimulus on left side but no right. Spontaneous movement noted in LLE/UE. Eyes opened for most of session but not able to get to midline with gaze despite cues. Would benefit from PT to maximize function in order to get ready for next venue listed below. Will follow acutely.    Follow Up Recommendations SNF;LTACH    Equipment Recommendations  None recommended by PT    Recommendations for Other Services       Precautions / Restrictions Precautions Precautions: Fall;Other (comment) Precaution Comments: trach collar, PEG Required Braces or Orthoses: Other Brace/Splint Other Brace/Splint: B Prevalon Boots Restrictions Weight Bearing Restrictions: No      Mobility  Bed Mobility Overal bed mobility: Needs Assistance Bed Mobility: Supine to Sit;Sit to Supine     Supine to sit: Total assist;+2 for physical assistance Sit to supine: Total assist;+2 for physical assistance   General bed mobility comments: No initiation of movement to get to EOB. Pushing through LUE.  Transfers                  General transfer comment: Not tested  Ambulation/Gait                Stairs            Wheelchair Mobility    Modified Rankin (Stroke Patients Only) Modified Rankin (Stroke Patients Only) Pre-Morbid Rankin Score: No symptoms Modified Rankin: Severe disability     Balance Overall balance assessment: Needs assistance Sitting-balance support: Feet supported;Single extremity supported;No upper extremity supported Sitting balance-Leahy Scale: Zero Sitting balance - Comments: non purposeful push of L UE (during assisted sitting). Requires total A for sitting EOB with heavy right lean due to pushing. Postural control: Right lateral lean;Posterior lean                                   Pertinent Vitals/Pain Pain Assessment: Faces Faces Pain Scale: No hurt    Home Living Family/patient expects to be discharged to:: Unsure                 Additional Comments: pt has a boyfriend and an 97 y/o child (by boyfriend)    Prior Function Level of Independence: Independent         Comments: likely independent, but no family around to get PLOF     Hand Dominance        Extremity/Trunk Assessment   Upper Extremity Assessment Upper Extremity Assessment: Defer to OT evaluation RUE Deficits / Details: flaccid RUE; +clonus LUE Deficits / Details: pushing through LUE in sitting; non-purposeful movemetn    Lower Extremity  Assessment Lower Extremity Assessment: RLE deficits/detail;LLE deficits/detail RLE Deficits / Details: no spontaneous movement or active movement noted- seems flaccid. No clonus present, does not respond to noxious stimulus. RLE Coordination: decreased gross motor;decreased fine motor LLE Deficits / Details: Spontaneous movement noted LLE, moving into knee flexion in bed and moving ankle sitting EOB.    Cervical / Trunk Assessment Cervical / Trunk Assessment: Other exceptions Cervical / Trunk Exceptions:  posterior pevic tilt; no trunk support, pushing through LUE  Communication   Communication: Expressive difficulties;Receptive difficulties(nonverbal during assessment- aphasic)  Cognition Arousal/Alertness: Lethargic Behavior During Therapy: Flat affect Overall Cognitive Status: Impaired/Different from baseline                                 General Comments: Able to open eyes once sitting EOB, left gaze preference. No withdrawal from pain on right side. Not following any commands today.      General Comments General comments (skin integrity, edema, etc.): Cervical spine is positioned in right side bend position with left gaze preference; no purposeful reactions to any stimulus.    Exercises     Assessment/Plan    PT Assessment Patient needs continued PT services  PT Problem List Decreased strength;Decreased balance;Decreased mobility;Decreased coordination;Impaired tone;Decreased range of motion;Decreased cognition       PT Treatment Interventions Functional mobility training;Therapeutic activities;Balance training;Neuromuscular re-education;Patient/family education;Gait training;Wheelchair mobility training;Therapeutic exercise    PT Goals (Current goals can be found in the Care Plan section)  Acute Rehab PT Goals Patient Stated Goal: unable to participate with goals PT Goal Formulation: Patient unable to participate in goal setting Time For Goal Achievement: 12/30/17 Potential to Achieve Goals: Fair    Frequency Min 3X/week   Barriers to discharge        Co-evaluation PT/OT/SLP Co-Evaluation/Treatment: Yes Reason for Co-Treatment: Complexity of the patient's impairments (multi-system involvement);For patient/therapist safety;To address functional/ADL transfers;Necessary to address cognition/behavior during functional activity PT goals addressed during session: Mobility/safety with mobility         AM-PAC PT "6 Clicks" Daily Activity  Outcome Measure  Difficulty turning over in bed (including adjusting bedclothes, sheets and blankets)?: Unable Difficulty moving from lying on back to sitting on the side of the bed? : Unable Difficulty sitting down on and standing up from a chair with arms (e.g., wheelchair, bedside commode, etc,.)?: Unable Help needed moving to and from a bed to chair (including a wheelchair)?: Total Help needed walking in hospital room?: Total Help needed climbing 3-5 steps with a railing? 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

## 2017-12-17 ENCOUNTER — Inpatient Hospital Stay (HOSPITAL_COMMUNITY): Payer: Medicaid Other

## 2017-12-17 LAB — BASIC METABOLIC PANEL
Anion gap: 10 (ref 5–15)
BUN: 14 mg/dL (ref 6–20)
CALCIUM: 9.5 mg/dL (ref 8.9–10.3)
CO2: 26 mmol/L (ref 22–32)
Chloride: 100 mmol/L (ref 98–111)
Creatinine, Ser: 0.81 mg/dL (ref 0.44–1.00)
GFR calc Af Amer: >60 mL/min (ref >60)
GLUCOSE: 120 mg/dL — AB (ref 70–99)
Potassium: 3.9 mmol/L (ref 3.5–5.1)
Sodium: 136 mmol/L (ref 135–145)

## 2017-12-17 LAB — GLUCOSE, CAPILLARY
GLUCOSE-CAPILLARY: 136 mg/dL — AB (ref 70–99)
Glucose-Capillary: 112 mg/dL — ABNORMAL HIGH (ref 70–99)
Glucose-Capillary: 138 mg/dL — ABNORMAL HIGH (ref 70–99)
Glucose-Capillary: 173 mg/dL — ABNORMAL HIGH (ref 70–99)
Glucose-Capillary: 117 mg/dL — ABNORMAL HIGH (ref 70–99)
Glucose-Capillary: 155 mg/dL — ABNORMAL HIGH (ref 70–99)

## 2017-12-17 LAB — CBC
HCT: 28 % — ABNORMAL LOW (ref 36.0–46.0)
Hemoglobin: 8.5 g/dL — ABNORMAL LOW (ref 12.0–15.0)
MCH: 24.4 pg — ABNORMAL LOW (ref 26.0–34.0)
MCHC: 30.4 g/dL (ref 30.0–36.0)
MCV: 80.2 fL (ref 80.0–100.0)
PLATELETS: 485 10*3/uL — AB (ref 150–400)
RBC: 3.49 MIL/uL — ABNORMAL LOW (ref 3.87–5.11)
RDW: 20.9 % — AB (ref 11.5–15.5)
WBC: 10.4 10*3/uL (ref 4.0–10.5)
nRBC: 0 % (ref 0.0–0.2)

## 2017-12-17 NOTE — Progress Notes (Addendum)
NEUROHOSPITALISTS STROKE TEAM - DAILY PROGRESS NOTE   SUBJECTIVE No family is at bedside.  Remains stable on floor with R HP and global aphasia.   OBJECTIVE Most recent Vital Signs: Vitals:   12/17/17 0341 12/17/17 0411 12/17/17 0413 12/17/17 0747  BP:  (!) 147/85  (!) 146/93  Pulse: 97 98  92  Resp: 20 18  (!) 26  Temp:  99.8 F (37.7 C)    TempSrc:  Oral    SpO2: 99% 99%    Weight:   75.1 kg   Height:       CBG (last 3)  Recent Labs    12/16/17 1956 12/16/17 2358 12/17/17 0411  GLUCAP 142* 153* 112*   CBC Latest Ref Rng & Units 12/17/2017 12/16/2017 12/14/2017  WBC 4.0 - 10.5 K/uL 10.4 10.1 9.5  Hemoglobin 12.0 - 15.0 g/dL 8.5(L) 8.3(L) 8.0(L)  Hematocrit 36.0 - 46.0 % 28.0(L) 26.8(L) 26.0(L)  Platelets 150 - 400 K/uL 485(H) 454(H) 398    BMP Latest Ref Rng & Units 12/17/2017 12/16/2017 12/14/2017  Glucose 70 - 99 mg/dL 120(H) 107(H) -  BUN 6 - 20 mg/dL 14 16 -  Creatinine 0.44 - 1.00 mg/dL 0.81 0.75 -  Sodium 135 - 145 mmol/L 136 138 140  Potassium 3.5 - 5.1 mmol/L 3.9 3.8 -  Chloride 98 - 111 mmol/L 100 102 -  CO2 22 - 32 mmol/L 26 27 -  Calcium 8.9 - 10.3 mg/dL 9.5 8.9 -    IMAGING No results found.    Physical Exam  HEENT-status post left frontoparietal craniotomy with wound clean dry, soft to touch. Cardiovascular- S1-S2 audible, pulses palpable throughout   Lungs- low breath sounds bilaterally, Status post tracheostomy with trach collar, nonlabored breathing, no significant drainage Abdomen- bowel sounds present, RLQ wound with staples CDI musculoskeletal-no joint tenderness, deformity or swelling Skin-warm and dry Neurological Exam:  Patient awakens with stimulation, globally aphasic, does not respond to commands.  Eyes open, left gaze preference, able to cross midline, PERRL. Not blinking to visual threat on the right. Right facial droop. Tongue protrusion not cooperative. LUE 5/5 and able to against  gravity. LLE withdraw to pain 454/5. RUE 2/5 on pain stimuli and RLE 1/5 to pain. Increasing RUE tone. mitt on L hand. B/l babinski positive. Sensation, coordination and gait not tested.   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 15 mm of left-to-right midline shift.  MRI of the brain: Post craniotomy 8-9 mm of right-to-left midline shift with mild left uncal herniation similar to previous. Underlying evolving large acute ischemic left MCA territory infarct involving the majority of the left MCA territory.  Repeat Head CT post evacuation 9/20 - Left MCA territory infarct with evacuated hematoma. Midline shift measures 8 mm.   UDS - positive for amphetamines ( Pt takes Adderal at home)  2D Echocardiogram EF 60 to 65%  LE venous doppler negative for DVT  Hypercoagulable labs negative except high ESR and CRP  Consider TEE if she has more functional neuro improvement   LDL 64  HgbA1c 5.8  VTE: Heparin subq - will d/c SCDs  Started ASA 81 on 12/04/17  Therapy recommendations: SNF  Disposition: Pending   Will pursue obtaining helmet for her as therapy has started  Cerebral edema, resolved  CT head midline shift 39m with uncal herniation  Repeat CT head s/p hematoma evacuation - midline shift 865m S/p hemicrani 9/20  Repeat CT 9/21 - midline shift 30m109mOn keppra for seizure prevention  CT repeat 12/02/2017 stable midline shift, large left MCA infarct, residual hematoma  3% saline d/c'ed on 12/06/17  Acute respiratory failure in the setting of massive IPH,  resolved  Status post tracheostomy on 12/09/17  Well on trach collar  Continue trach care per protocol  Intermittent chest x-rays  Chest x-ray done on 12/14/2017 shows no acute disease   Seizure  Episode of stiffness, tongue biting  Overnight EEG no seizure  Increased keppra from 500 to 1000m61md  No more seizure since  Seizure precautions  Hypertension emergency  BP elevated on admission  BP goal <140   On norvasc 10, lisinopril 20 bid and labetalol 300 tid  Off minoxidil and HCTZ with lowering BPs  Today 120-140s  Monitor BP   Anemia  Hb 11.8 on admission  Hemoglobin 7.3-->8.0->8.3->8.5  Received one unit PRBC during this admission    Continue to monitor  Iron study iron 36 and ferritin 66  Pt currently on  iron pills  PRBC transfusion if HGb < 7.0  Leukocytosis, resolved   WBC 9.4-->9.5->10.4  No fever   cultures neg  Continue monitoring  Nutrition  PEG tube placed on 12/09/2017 for tube feeding  Accu-Chek every 4 hours while on tube feeding  On free water  Transition to bolus tube feeds   Other Stroke Risk Factors  ETOH use   Other Active Problems  ADHD - on Adderell at home  Anxiety  Depression  Hypokalemia  Hospital day # 20  Weston LakesN, APRN, ANVP-BC, AGPCNP-BC Advanced Practice Stroke Nurse [facility] Schedule & Pager information 12/17/2017 8:01 AM   ATTENDING NOTE: I reviewed above note and agree with the assessment and plan. Pt was seen and examined.   No family at bedside.  No acute event overnight, neuro stable, no significant change from yesterday. However, she still has copious secretion from trach, RT called for tracheal suctioning. CXR showed no active disease. No fever. BP stable after adjustment yesterday.  Anemia slowly improving. Continue tube feeding and trach collar.  Continue aspirin and Keppra. PT/OT recommend SNF, and social worker is working on  placement.  JindRosalin Hawking PhD Stroke Neurology 12/17/2017 4:07 PM    To contact Stroke Continuity provider, please refer to [email or web address], contact General Neurology

## 2017-12-17 NOTE — Progress Notes (Signed)
Orthopedic Tech Progress Note Patient Details:  Tammie Miles [birth year] [id number]  Patient ID: Tammie Miles, female   DOB: 04/12/1975, 42 y.o.   MRN: [id number]   Nikki Dom 12/17/2017, 1:16 PM Called in bio-tech brace order; spoke with [facility]

## 2017-12-17 NOTE — Progress Notes (Signed)
Nutrition Follow-up  DOCUMENTATION CODES:   Obesity unspecified  INTERVENTION:  Continue bolus tube feeds using Jevity 1.2 cal formula via PEG at goal of 360 ml QID.   Continue 70 ml free water flushes before and after each bolus feed.   Tube feeding regimen to provide 1728 kcal, 80 grams of protein, and 1726 ml water.   NUTRITION DIAGNOSIS:   Inadequate oral intake related to inability to eat as evidenced by NPO status; ongoing  GOAL:   Patient will meet greater than or equal to 90% of their needs; met with TF  MONITOR:   TF tolerance, Labs, Skin, Weight trends, I & O's  REASON FOR ASSESSMENT:   Consult Enteral/tube feeding initiation and management  ASSESSMENT:   Pt with PMH of HTN with prescribed stimulant use admitted with large L hemorrhagic basal ganglia with 15 mm shift with early herniation s/p L frontotemporal craniotomy for evacuation of hematoma.   9/19 intubated, OR for crani evacuation 9/20 decompressive L craniectomy 10/1 trach, PEG 10/4 transitioned to TC   Pt has been tolerating her bolus tube feeds with no other difficulties per RN. Weight fluctuating however mostly stable. RD to continue with current tube feeding orders. Labs and medications reviewed.   Diet Order:  NPO Diet Order    None      EDUCATION NEEDS:   No education needs have been identified at this time  Skin:  Skin Assessment: Skin Integrity Issues: Skin Integrity Issues:: Incisions Incisions: L head, abdomen  Last BM:  10/8  Height:   Ht Readings from Last 1 Encounters:  12/01/17 5' 1"  (1.549 m)    Weight:   Wt Readings from Last 1 Encounters:  12/17/17 75.1 kg    Ideal Body Weight:  47.7 kg  BMI:  Body mass index is 31.28 kg/m.  Estimated Nutritional Needs:   Kcal:  1600-1800  Protein:  80-100 grams  Fluid:  > 1.6 L/day    Corrin Parker, MS, RD, LDN Pager # [phone] After hours/ weekend pager # [phone]

## 2017-12-17 NOTE — Progress Notes (Signed)
Physical Therapy Treatment Patient Details Name: Tammie Miles MRN: [id number] DOB: 07-15-1975 Today's Date: 12/17/2017    History of Present Illness Patient is a 42 y/o female who was admitted for AMS/unresponsiveness. 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 Returned to OR 9/20 for decompressive crani with left skull harvested in abdomen. 9/25 developed seizures. Intubated 9/19-10/7. s/p trach and peg 10/1. PMH includes HTN, depression, anxiety.     PT Comments    Pt continues to require total A x2 for functional mobility and to sit EOB. Provided tactile and proprioceptive input to bilateral LEs in sitting. No purposeful response to any stimuli. All VSS throughout with pt on 28% trach collar. Pt would continue to benefit from skilled physical therapy services at this time while admitted and after d/c to address the below listed limitations in order to improve overall safety and independence with functional mobility.   Follow Up Recommendations  SNF;LTACH     Equipment Recommendations  None recommended by PT    Recommendations for Other Services       Precautions / Restrictions Precautions Precautions: Fall;Other (comment) Precaution Comments: trach collar, PEG Required Braces or Orthoses: Other Brace/Splint Other Brace/Splint: B Prevalon Boots Restrictions Weight Bearing Restrictions: No    Mobility  Bed Mobility Overal bed mobility: Needs Assistance Bed Mobility: Supine to Sit;Sit to Supine     Supine to sit: Total assist;+2 for physical assistance Sit to supine: Total assist;+2 for physical assistance   General bed mobility comments: Pt with no initiation of movement to get to EOB. Pushing towards her R with LUE.  Transfers                 General transfer comment: pt would require a mechanical lift at  this time  Ambulation/Gait                 Stairs             Wheelchair Mobility    Modified Rankin (Stroke Patients Only)       Balance Overall balance assessment: Needs assistance Sitting-balance support: Feet supported;Single extremity supported;No upper extremity supported Sitting balance-Leahy Scale: Zero Sitting balance - Comments: non purposeful push of L UE (during assisted sitting). Requires total A for sitting EOB with heavy right lean due to pushing. Attempted to have pt lean towards L with WB'ing through L forearm but pt with too strong of a push and desire to push with no understanding of commands Postural control: Right lateral lean                                  Cognition Arousal/Alertness: Lethargic Behavior During Therapy: Flat affect Overall Cognitive Status: Difficult to assess                                        Exercises      General Comments General comments (skin integrity, edema, etc.): no purposeful movement; clonus noted in bilateral ankles      Pertinent Vitals/Pain Pain Assessment: Faces Faces Pain Scale: No hurt    Home Living  Prior Function            PT Goals (current goals can now be found in the care plan section) Acute Rehab PT Goals PT Goal Formulation: Patient unable to participate in goal setting Time For Goal Achievement: 12/30/17 Potential to Achieve Goals: Fair Progress towards PT goals: Progressing toward goals    Frequency    Min 3X/week      PT Plan Current plan remains appropriate    Co-evaluation              AM-PAC PT "6 Clicks" Daily Activity  Outcome Measure  Difficulty turning over in bed (including adjusting bedclothes, sheets and blankets)?: Unable Difficulty moving from lying on back to sitting on the side of the bed? : Unable Difficulty sitting down on and standing up from a chair with arms (e.g., wheelchair,  bedside commode, etc,.)?: Unable Help needed moving to and from a bed to chair (including a wheelchair)?: Total Help needed walking in hospital room?: Total Help needed climbing 3-5 steps with a railing? : Total 6 Click Score: 6    End of Session Equipment Utilized During Treatment: Oxygen Activity Tolerance: Other (comment)(28% trach collar) Patient left: in bed;with call bell/phone within reach;with bed alarm set Nurse Communication: Mobility status;Need for lift equipment PT Visit Diagnosis: Hemiplegia and hemiparesis Hemiplegia - Right/Left: Right Hemiplegia - caused by: Nontraumatic intracerebral hemorrhage     Time: 1150-1208 PT Time Calculation (min) (ACUTE ONLY): 18 min  Charges:  $Therapeutic Activity: 8-22 mins                     Deborah Chalk, PT, DPT  Acute Rehabilitation Services Pager [phone] Office [phone]     [name] 12/17/2017, 1:41 PM

## 2017-12-18 LAB — BASIC METABOLIC PANEL
ANION GAP: 11 (ref 5–15)
BUN: 17 mg/dL (ref 6–20)
CHLORIDE: 103 mmol/L (ref 98–111)
CO2: 25 mmol/L (ref 22–32)
Calcium: 9.4 mg/dL (ref 8.9–10.3)
Creatinine, Ser: 0.75 mg/dL (ref 0.44–1.00)
GFR calc Af Amer: >60 mL/min (ref >60)
Glucose, Bld: 113 mg/dL — ABNORMAL HIGH (ref 70–99)
POTASSIUM: 3.9 mmol/L (ref 3.5–5.1)
SODIUM: 139 mmol/L (ref 135–145)

## 2017-12-18 LAB — GLUCOSE, CAPILLARY
GLUCOSE-CAPILLARY: 108 mg/dL — AB (ref 70–99)
GLUCOSE-CAPILLARY: 111 mg/dL — AB (ref 70–99)
Glucose-Capillary: 104 mg/dL — ABNORMAL HIGH (ref 70–99)
Glucose-Capillary: 148 mg/dL — ABNORMAL HIGH (ref 70–99)
Glucose-Capillary: 92 mg/dL (ref 70–99)

## 2017-12-18 LAB — CBC
HCT: 28.2 % — ABNORMAL LOW (ref 36.0–46.0)
Hemoglobin: 8.5 g/dL — ABNORMAL LOW (ref 12.0–15.0)
MCH: 24.6 pg — ABNORMAL LOW (ref 26.0–34.0)
MCHC: 30.1 g/dL (ref 30.0–36.0)
MCV: 81.5 fL (ref 80.0–100.0)
NRBC: 0 % (ref 0.0–0.2)
PLATELETS: 488 10*3/uL — AB (ref 150–400)
RBC: 3.46 MIL/uL — AB (ref 3.87–5.11)
RDW: 21 % — ABNORMAL HIGH (ref 11.5–15.5)
WBC: 10.8 10*3/uL — ABNORMAL HIGH (ref 4.0–10.5)

## 2017-12-18 LAB — CULTURE, BLOOD (ROUTINE X 2)
CULTURE: NO GROWTH
Culture: NO GROWTH

## 2017-12-18 MED ORDER — SENNOSIDES-DOCUSATE SODIUM 8.6-50 MG PO TABS: 1.0000 | ORAL_TABLET | Freq: Every evening | ORAL | Status: DC | PRN

## 2017-12-18 NOTE — Progress Notes (Signed)
Occupational Therapy Treatment Patient Details Name: Tammie Miles MRN: [id number] DOB: 07/27/1975 Today's Date: 12/18/2017    History of present illness Patient is a 42 y/o female who was admitted for AMS/unresponsiveness. 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 Returned to OR 9/20 for decompressive crani with left skull harvested in abdomen. 9/25 developed seizures. Intubated 9/19-10/7. s/p trach and peg 10/1. PMH includes HTN, depression, anxiety.    OT comments  Pt with noticeable L side of scalp swelling at this time. Pt moving L side during session only and holding R side in extensor tone. Pt observed with hard prevalon boot on and then when returning to see patient doff. Pt pushing on boot with L LE and pushing it off with movement. Question risk for skin break down due to this movement and setting times for don doff with RN staff to prevent these injuries. Pt could benefit from signs to alert staff to L bone flap out so will post next session. Pt on vital go bed at this time. RN provided packet of information regarding bed and recommend RN staff call Vital go bed rep for inservice if concerned or have questions.     Follow Up Recommendations  Supervision/Assistance - 24 hour;LTACH;SNF    Equipment Recommendations  Hospital bed;Wheelchair (measurements OT);Wheelchair cushion (measurements OT)    Recommendations for Other Services Other (comment)    Precautions / Restrictions Precautions Precautions: Fall;Other (comment) Precaution Comments: trach collar, PEG Required Braces or Orthoses: Other Brace/Splint Other Brace/Splint: B Prevalon Boots       Mobility Bed Mobility Overal bed mobility: Needs Assistance Bed Mobility: Rolling Rolling: Total assist;+2 for physical assistance            Transfers                       Balance                                           ADL either performed or assessed with clinical judgement   ADL Overall ADL's : Needs assistance/impaired Eating/Feeding: NPO           Lower Body Bathing: Total assistance;Bed level   Upper Body Dressing : Total assistance;Bed level   Lower Body Dressing: Total assistance;Bed level                 General ADL Comments: pt noted to be incontinent of bowel and bladder and restless intially on entry. pt transfered to vital go bed. Pt completed roll log for peri care adn linen change. straps for the bed placed in the closet drawer at this time x3     Vision   Additional Comments: no visual tracking. pt opening eyes during session but not to therapists   Perception     Praxis      Cognition Arousal/Alertness: Lethargic Behavior During Therapy: Flat affect Overall Cognitive Status: Difficult to assess                                          Exercises     Shoulder Instructions       General  Comments noted to have blood secretions from trach at this time. RN aware. Pt positioned on chair alarm pad under linen to allow for alarm at this time. pt does not demonstrate any attempts at bed exit but alarm positioned to help RN staff have a alert if patient positions close to the edge of the bed due to bed height.     Pertinent Vitals/ Pain       Pain Assessment: No/denies pain  Home Living                                          Prior Functioning/Environment              Frequency  Min 2X/week        Progress Toward Goals  OT Goals(current goals can now be found in the care plan section)  Progress towards OT goals: Not progressing toward goals - comment  Acute Rehab OT Goals Patient Stated Goal: unable to participate with goals OT Goal Formulation: Patient unable to participate in goal setting Time For Goal Achievement:  12/30/17 Potential to Achieve Goals: Fair ADL Goals Pt Will Perform Grooming: with max assist;bed level Additional ADL Goal #1: Pt will maintain midline postural position at EOB with max assist in prep for ADL. Additional ADL Goal #2: Pt will demonstrate purposeful movement in L UE non-distracting enviorment during 2/3 trials.  Plan Discharge plan remains appropriate    Co-evaluation                 AM-PAC PT "6 Clicks" Daily Activity     Outcome Measure   Help from another person eating meals?: Total Help from another person taking care of personal grooming?: Total Help from another person toileting, which includes using toliet, bedpan, or urinal?: Total Help from another person bathing (including washing, rinsing, drying)?: Total Help from another person to put on and taking off regular upper body clothing?: Total Help from another person to put on and taking off regular lower body clothing?: Total 6 Click Score: 6    End of Session Equipment Utilized During Treatment: Oxygen  OT Visit Diagnosis: Other abnormalities of gait and mobility (R26.89);Muscle weakness (generalized) (M62.81);Low vision, both eyes (H54.2);Other symptoms and signs involving cognitive function;Hemiplegia and hemiparesis Hemiplegia - Right/Left: Right Hemiplegia - caused by: Nontraumatic intracerebral hemorrhage   Activity Tolerance Patient limited by lethargy   Patient Left in bed;with bed alarm set   Nurse Communication Mobility status;Precautions        Time: 1025-1110 OT Time Calculation (min): 45 min  Charges: OT General Charges $OT Visit: 1 Visit OT Treatments $Self Care/Home Management : 38-52 mins   Mateo Flow, OTR/L  Acute Rehabilitation Services Pager: [phone] Office: [phone] .    Boone Master B 12/18/2017, 8:54 PM

## 2017-12-18 NOTE — Progress Notes (Addendum)
NEUROHOSPITALISTS STROKE TEAM - DAILY PROGRESS NOTE   SUBJECTIVE SLP working with pt - tolerated ice for a while, then began to cough. Would like to begin [employer] trials. Pt to get new bed today to assist w/ mobility. Helmet also ordered.   OBJECTIVE Most recent Vital Signs: Vitals:   12/18/17 0414 12/18/17 0532 12/18/17 0730 12/18/17 0739  BP:  (!) 144/83 (!) 146/112 (!) 149/91  Pulse: 94  85 90  Resp: (!) 22 16 (!) 28 16  Temp:    99.2 F (37.3 C)  TempSrc:    Oral  SpO2: 99% 99% 100% 100%  Weight:      Height:       CBG (last 3)  Recent Labs    12/17/17 2316 12/18/17 0325 12/18/17 0742  GLUCAP 155* 104* 108*   CBC Latest Ref Rng & Units 12/18/2017 12/17/2017 12/16/2017  WBC 4.0 - 10.5 K/uL 10.8(H) 10.4 10.1  Hemoglobin 12.0 - 15.0 g/dL 8.5(L) 8.5(L) 8.3(L)  Hematocrit 36.0 - 46.0 % 28.2(L) 28.0(L) 26.8(L)  Platelets 150 - 400 K/uL 488(H) 485(H) 454(H)    BMP Latest Ref Rng & Units 12/18/2017 12/17/2017 12/16/2017  Glucose 70 - 99 mg/dL 113(H) 120(H) 107(H)  BUN 6 - 20 mg/dL 17 14 16   Creatinine 0.44 - 1.00 mg/dL 0.75 0.81 0.75  Sodium 135 - 145 mmol/L 139 136 138  Potassium 3.5 - 5.1 mmol/L 3.9 3.9 3.8  Chloride 98 - 111 mmol/L 103 100 102  CO2 22 - 32 mmol/L 25 26 27   Calcium 8.9 - 10.3 mg/dL 9.4 9.5 8.9    IMAGING Dg Chest Port 1 View  Result Date: 12/17/2017 CLINICAL DATA:  Cough. EXAM: PORTABLE CHEST 1 VIEW COMPARISON:  12/14/2017 FINDINGS: Tracheostomy tube remains well positioned. Cardiac silhouette is normal in size. No mediastinal or hilar masses. No convincing adenopathy. Clear lungs.  No pleural effusion or pneumothorax. Skeletal structures are grossly intact. IMPRESSION: No active disease. Electronically Signed   By: Lajean Manes M.D.   On: 12/17/2017 15:52    Physical Exam  HEENT-status post left frontoparietal craniotomy with wound clean dry, soft to touch. Cardiovascular- S1-S2 audible, pulses  palpable throughout   Lungs- low breath sounds bilaterally, Status post tracheostomy with trach collar, nonlabored breathing, no significant drainage Abdomen- bowel sounds present, RLQ wound with staples CDI musculoskeletal-no joint tenderness, deformity or swelling Skin-warm and dry Neurological Exam:  Awake, alert, setting up in bed taking in ice chips from SLP. Globally aphasic, does not respond to commands.  Eyes open, left gaze preference decreasing, able to cross midline, PERRL. Not blinking to visual threat on the right. Right facial droop. Tongue protrusion not cooperative. LUE 5/5 and able to against gravity. LLE withdraw to pain 4/5. RUE 2/5 on pain stimuli and RLE 1/5 to pain. Increasing RUE tone. mitt on L hand. B/l babinski positive. Reacts to cold sensation bilateral arms. Sensation, coordination and gait not otherwise tested.   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 15 mm of left-to-right midline shift.  MRI of the brain: Post craniotomy 8-9 mm of right-to-left midline shift with mild  left uncal herniation similar to previous. Underlying evolving large acute ischemic left MCA territory infarct involving the majority of the left MCA territory.  Repeat Head CT post evacuation 9/20 - Left MCA territory infarct with evacuated hematoma. Midline shift measures 8 mm.   UDS - positive for amphetamines ( Pt takes Adderal at home)  2D Echocardiogram EF 60 to 65%  LE venous doppler negative for DVT  Hypercoagulable labs negative except high ESR and CRP  Consider TEE if she has more  functional neuro improvement   LDL 64  HgbA1c 5.8  VTE: Heparin subq - will d/c SCDs  Started ASA 81 on 12/04/17  Therapy recommendations: SNF  Disposition: Pending   helmet ordered - for delivery next week  New bed to increase mobility  SLP start [place] trials  Cerebral edema, resolved  CT head midline shift 53m with uncal herniation  Repeat CT head s/p hematoma evacuation - midline shift 834m S/p hemicrani 9/20  Repeat CT 9/21 - midline shift 34m54mOn keppra for seizure prevention  CT repeat 12/02/2017 stable midline shift, large left MCA infarct, residual hematoma  3% saline d/c'ed on 12/06/17  D/c abd stables today  Acute respiratory failure in the setting of massive IPH  Status post tracheostomy on 12/09/17  Well on trach collar  Continue trach care per protocol  Intermittent chest x-rays  Chest x-ray done on 12/14/2017 shows no acute disease   Increased secretions yest after noon, now with mild hemoptosis. Asked RN to mildly suction and call for increase. Labs in am  Seizure  Episode of stiffness, tongue biting  Overnight EEG no seizure  Increased keppra from 500 to 1000m59md  No more seizure since  Seizure precautions  Hypertension emergency  BP elevated on admission  BP goal <180   On norvasc 10, lisinopril 20 bid and labetalol 300 tid  Off minoxidil and HCTZ with lowering BPs  Today 140-150s  Monitor BP   Anemia  Hb 11.8 on admission  Hemoglobin 7.3-->8.0->8.3->8.5  Received one unit PRBC during this admission    Continue to monitor  Iron study iron 36 and ferritin 66  Pt currently on  iron pills  PRBC transfusion if HGb < 7.0  Leukocytosis, resolved   WBC 9.4-->9.5->10.4  No fever   cultures neg  Dysphagia, secondary to stroke Nutrition  PEG tube placed on 12/09/2017 for tube feeding  Accu-Chek every 4 hours while on tube feeding  On free water  On bolus tube feeds   Other Stroke Risk  Factors  ETOH use   Other Active Problems  ADHD - on Adderell at home  Anxiety  Depression  Hypokalemia  Hospital day # 21  [place], APRN, ANVP-BC, AGPCNP-BC Advanced Practice Stroke Nurse [facility] Schedule & Pager information 12/18/2017 8:20 AM   ATTENDING NOTE: I reviewed above note and agree with the assessment and plan. Pt was seen and examined.  No familyat bedside. No acute event overnight, neuro stable.  Less secretions than yesterday, speech therapist is working with her for speech involved and RT is downsizing the trach.  CXR yesterday showed no active disease. No fever, but slight leukocytosis today. CBC pending tomorrow.  BP stable. Anemia continue to slowly improve. Continue tube feeding and trach collar. On aspirin and Keppra. PT/OT recommend SNF, and social worker is working on placement.  JindRosalin Hawking PhD Stroke Neurology 12/18/2017 4:00 PM     To contact Stroke Continuity  provider, please refer to [email or web address] After hours, contact General Neurology

## 2017-12-18 NOTE — Progress Notes (Signed)
  Speech Language Pathology Treatment: Cognitive-Linquistic  Patient Details Name: Tammie Miles MRN: [id number] DOB: 1976-02-29 Today's Date: 12/18/2017 Time: 1001-1009 SLP Time Calculation (min) (ACUTE ONLY): 8 min  Assessment / Plan / Recommendation Clinical Impression  Patient with improvements in receptive communication and awareness today. Able to follow basic 1-step commands in the context of a familiar task with approximately 25% accuracy and with moderate-max clinician cueing. Max physical assistance required for sustained attention to task past 10-15 seconds. Able to demonstrate basic awareness of surroundings, responding to fabric on legs and scratching face. Patient remains non-verbal despite max clinician cueing. In general, making progress of goals. Plan for PMV evaluation next date if appropriate.    HPI HPI: 19 yoF with PMH significant for HTN, anxiety, ADHD, admitted 9/19 with large L hemorrhagic basal ganglia with 15 mm shift with early herniation s/p L frontotemporal crani 9/19 for evacuation of hematoma. ETT 9/19; 9/20 returned to OR for decompressive craniectomy ; 10/1 trach/PEG;  transitioned to TC and off vent 10/5.  CT 9/24: 1. Left MCA distribution infarction is stable in distribution. Mild increased edema of the infarct with increased herniation of the left frontal lobe via the left frontal craniectomy. 2. Stable 6 mm left-to-right midline shift and partial effacement of left lateral ventricle. 3. Stable small hematoma within the left basal ganglia.       SLP Plan  Continue with current plan of care       Recommendations  Medication Administration: Via alternative means                Oral Care Recommendations: Oral care QID Follow up Recommendations: Inpatient Rehab SLP Visit Diagnosis: Dysphagia, oropharyngeal phase (R13.12) Plan: Continue with current plan of care       GO          Verlisa Vara MA, CCC-SLP        Essam Lowdermilk Meryl 12/18/2017,  10:19 AM

## 2017-12-18 NOTE — Evaluation (Signed)
Clinical/Bedside Swallow Evaluation Patient Details  Name: Tammie Miles MRN: [id number] Date of Birth: 14-Mar-1975  Today's Date: 12/18/2017 Time: SLP Start Time (ACUTE ONLY): 4098 SLP Stop Time (ACUTE ONLY): 1009 SLP Time Calculation (min) (ACUTE ONLY): 18 min  Past Medical History:  Past Medical History:  Diagnosis Date  . ADHD   . Anxiety   . Depression   . Hypertension    HPI:  70 yoF with PMH significant for HTN, anxiety, ADHD, admitted 9/19 with large L hemorrhagic basal ganglia with 15 mm shift with early herniation s/p L frontotemporal crani 9/19 for evacuation of hematoma. ETT 9/19; 9/20 returned to OR for decompressive craniectomy ; 10/1 trach/PEG;  transitioned to TC and off vent 10/5.  CT 9/24: 1. Left MCA distribution infarction is stable in distribution. Mild increased edema of the infarct with increased herniation of the left frontal lobe via the left frontal craniectomy. 2. Stable 6 mm left-to-right midline shift and partial effacement of left lateral ventricle. 3. Stable small hematoma within the left basal ganglia.    Assessment / Plan / Recommendation Clinical Impression  Patient alert and awake for swallow evaluation today. Able to demonstrate bolus awareness with minimal tactile cueing (ice chip to bottom lip). Opens mouth and orally accepts bolus, able to orally transit with mild delayes and initiate a swallow. Delayed strong cough response noted post intake. Patient continue with #6.0 cuffed shiley, no PMV in place however MD entered room as SLP finishing treatment. Requested PMV orders to allow patient ot access upper airway despite non-verbal status. Will not attempt placement of PMV today as patient with active mild bleeding from trach site (RN/MD aware). Plan for PMV evaluation 10/11 along with continued po trials to determine readiness for possible instrumental testing.  SLP Visit Diagnosis: Dysphagia, oropharyngeal phase (R13.12)    Aspiration Risk  Severe  aspiration risk    Diet Recommendation NPO   Medication Administration: Via alternative means    Other  Recommendations Oral Care Recommendations: Oral care QID   Follow up Recommendations Inpatient Rehab      Frequency and Duration min 3x week  2 weeks       Prognosis Prognosis for Safe Diet Advancement: Fair Barriers to Reach Goals: Severity of deficits      Swallow Study   General HPI: [name] with PMH significant for HTN, anxiety, ADHD, admitted 9/19 with large L hemorrhagic basal ganglia with 15 mm shift with early herniation s/p L frontotemporal crani 9/19 for evacuation of hematoma. ETT 9/19; 9/20 returned to OR for decompressive craniectomy ; 10/1 trach/PEG;  transitioned to TC and off vent 10/5.  CT 9/24: 1. Left MCA distribution infarction is stable in distribution. Mild increased edema of the infarct with increased herniation of the left frontal lobe via the left frontal craniectomy. 2. Stable 6 mm left-to-right midline shift and partial effacement of left lateral ventricle. 3. Stable small hematoma within the left basal ganglia.  Type of Study: Bedside Swallow Evaluation Previous Swallow Assessment: none Diet Prior to this Study: NPO;PEG tube Temperature Spikes Noted: No Respiratory Status: Trach;Trach Collar Trach Size and Type: #6;Cuff History of Recent Intubation: Yes Length of Intubations (days): 13 days Date extubated: 12/09/17 Behavior/Cognition: Alert;Cooperative Oral Cavity Assessment: Within Functional Limits Oral Care Completed by SLP: Yes Oral Cavity - Dentition: Adequate natural dentition Vision: Functional for self-feeding(appears) Self-Feeding Abilities: Able to feed self;Needs assist Patient Positioning: Upright in bed Baseline Vocal Quality: Not observed Volitional Cough: Cognitively unable to elicit(spontaneous cough strong) Volitional  Swallow: Able to elicit    Oral/Motor/Sensory Function Overall Oral Motor/Sensory Function: Moderate  impairment(difficulty to fully assess due to cognition/aphasia) Facial ROM: Reduced right Facial Symmetry: Abnormal symmetry right   Ice Chips Ice chips: Impaired Presentation: Spoon Oral Phase Functional Implications: Prolonged oral transit Pharyngeal Phase Impairments: Cough - Delayed   Thin Liquid Thin Liquid: Not tested    Nectar Thick Nectar Thick Liquid: Not tested   Honey Thick Honey Thick Liquid: Not tested   Puree Puree: Not tested   Solid     Solid: Not tested     Tammie Miles, CCC-SLP   Tammie Miles 12/18/2017,10:17 AM

## 2017-12-19 LAB — CBC
HCT: 28 % — ABNORMAL LOW (ref 36.0–46.0)
Hemoglobin: 8.2 g/dL — ABNORMAL LOW (ref 12.0–15.0)
MCH: 24 pg — AB (ref 26.0–34.0)
MCHC: 29.3 g/dL — ABNORMAL LOW (ref 30.0–36.0)
MCV: 81.9 fL (ref 80.0–100.0)
PLATELETS: 473 10*3/uL — AB (ref 150–400)
RBC: 3.42 MIL/uL — ABNORMAL LOW (ref 3.87–5.11)
RDW: 20.8 % — ABNORMAL HIGH (ref 11.5–15.5)
WBC: 11 10*3/uL — ABNORMAL HIGH (ref 4.0–10.5)
nRBC: 0 % (ref 0.0–0.2)

## 2017-12-19 LAB — GLUCOSE, CAPILLARY
GLUCOSE-CAPILLARY: 103 mg/dL — AB (ref 70–99)
GLUCOSE-CAPILLARY: 108 mg/dL — AB (ref 70–99)
GLUCOSE-CAPILLARY: 125 mg/dL — AB (ref 70–99)
GLUCOSE-CAPILLARY: 129 mg/dL — AB (ref 70–99)
Glucose-Capillary: 105 mg/dL — ABNORMAL HIGH (ref 70–99)
Glucose-Capillary: 129 mg/dL — ABNORMAL HIGH (ref 70–99)
Glucose-Capillary: 161 mg/dL — ABNORMAL HIGH (ref 70–99)

## 2017-12-19 NOTE — Progress Notes (Signed)
Patient's trach tube was changed to a #6 cuffless shiley trach tube with the help of Lauren, RRT. Patient already had hemoptysis before trach change. There were no adverse events noted. Placement was checked with an ETCO2 monitor. Will continue to monitor.

## 2017-12-19 NOTE — Progress Notes (Signed)
Trach care done per RRT, no complications noted. Patient tolerated it well.

## 2017-12-19 NOTE — Progress Notes (Addendum)
NAME:  Tammie Miles, MRN:  [id number], DOB:  [birth year], LOS: 22 ADMISSION DATE:  11/27/2017, CONSULTATION DATE:  11/27/2017 REFERRING MD:  Dr. Conchita Paris CHIEF COMPLAINT:  Left frontal hemorrhage  Brief History   83 yoF admitted 9/19 with AMS that began the evening of 9/18, initially thought due to intoxication. Remained unresponsive through am of 9/19.  Patient was brought to the ED where a CT was done and showed a large left sided ICH. Patient was taken to the OR and a mini craniectomy was done.  Patient was brought out to the ICU and PCCM was consulted for vent management. Course complicated by cerebral edema requiring 3% saline limited by hypernatremia. Also complicated by encephalopathy. Tolerating vent wean, but mental status limited extubation. Then 9/25 she developed what appeared to be seizures with gaze deviation and tongue biting. EEG done overnight with results pending and AEDs adjusted. No further seizure seen.   Significant Hospital Events   9/19 Admit/ OR- mini crani 9/20 Decompressive craniectomy ( L)  with skull harvesting in the abdomen 9/21 hypertonic saline started 9/23 hypertonic stopped.  9/25 Seizure. EEG done with no sz activity. AEDs adjusted. 10/1 Trach / PEG 10/2 Trach collar    Consults: date of consult/date signed off & final recs  9/19 NSGY 9/19 PCCM   Procedures (surgical and bedside):  9/19 Intubated 9/19 OR for crani, evac of hematoma>> no change in neuro status post op 9/20 Decompressive craniectomy ( L)  with skull harvesting in the abdomen  Significant Diagnostic Tests:  9/19 [facility]  Large area of hemorrhage in the left basal ganglia and left frontal lobe, 5.8 x 3.5 cm. 15 mm of left-to-right midline shift. 9/19 CTA head: Positive for left MCA M1 large vessel occlusion, and also severe stenosis at the left ICA terminus, superimposed on the relatively large acute left hemisphere intra-axial hemorrhage (estimated blood volume 43 mL). Vasospasm suspected in  the proximal ACAs. 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 2. Negative for intracranial aneurysm, CTA spot sign, or evidence of vascular malformation in association with the acute hemorrhage. 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 3. Stable intracranial mass effect since 0938 hours today, with 16 mm of leftward midline shift and trapping of the right lateral ventricle. 11/29/2017 CT Head Large left MCA infarct is stable in distribution. Interval left frontal craniectomy with mild herniation of left frontal lobe through the defect. Resolved uncal herniation, improved patency of left lateral ventricle, and 6 mm left-to-right midline shift, previously 8 mm. Partial interval dispersion of acute hemorrhage in the left basal ganglia. Subcentimeter density anterior to the prior position of blood products possibly representing retracted clot or interval hemorrhage, attention at follow-up recommended. CT 9/24: 1. Left MCA distribution infarction is stable in distribution. Mild increased edema of the infarct with increased herniation of the left frontal lobe via the left frontal craniectomy. 2. Stable 6 mm left-to-right midline shift and partial effacement of left lateral ventricle. 3. Stable small hematoma within the left basal ganglia. 4. No new acute intracranial abnormality identified.  Micro Data: 9/19 MRSA PCR >> Negative 9/19 Sputum Culture >> normal flora 9/22 Blood x 2 >> negative 9/22 UA >> Normal 9/22 Sputum >> few candida dubliniensis 12/13/2017 blood cultures x2>> 12/13/2017 urine culture>> 12/13/2017 sputum culture>>  Antimicrobials:  9/19 Cefazolin (pre-op) 9/20  Subjective:  Continues to tolerate  ATC well.  No overnight events.  Objective   Blood pressure (!) 153/99, pulse 95, temperature 97.7 F  (36.5 C), temperature source Axillary, resp. rate (!) 22, height 5\' 1"  (1.549 m), weight 69.7 kg, SpO2 100 %.    FiO2 (%):  [28 %] 28 %   Intake/Output Summary (Last 24 hours) at 12/19/2017 1114 Last data filed at 12/19/2017 0900 Gross per 24 hour  Intake 1060.87 ml  Output -  Net 1060.87 ml   Filed Weights   12/16/17 0500 12/17/17 0413 12/18/17 0335  Weight: 79.6 kg 75.1 kg 69.7 kg    Intake/Output Summary (Last 24 hours) at 12/19/2017 1114 Last data filed at 12/19/2017 0900 Gross per 24 hour  Intake 1060.87 ml  Output -  Net 1060.87 ml   Examination:  General: Middle-aged female no acute distress HEENT: Trach in place with trach collar scant bloody drainage noted Neuro: Follows commands CV: s1s2 rrr, no m/r/g PULM: even/non-labored, lungs bilaterally diminished bases ZO:XWRU, non-tender, bsx4 active  Extremities: warm/dry, plus edema  Skin: no rashes or lesions   Resolved Hospital Problem list     Assessment & Plan:    Acute respiratory insufficiency in the setting of massive IPH - s/p trach placement 10/1. - Maintain on TC as tolerated -Remove trach sutures now -Monitor trach bleeding    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 Now status post hemicraniotomy for evacuation. Complicated by cerebral edema requiring hypertonic saline, which was stopped 9/28. She remains slow to improve.  -Primary  Severe hypertension with adequate control at present.  -Per primary   Central fever - improving. Cultures are negative.  Leukocytosis - resolved. Likely neuro driven. PCT 0.13, remains reassuring.  -Primary  Anemia of acute illness. -Per primary  Protein calorie malnutrition with tube feed diarrhea. -Primary   Possible ETOH abuse -Per primary  Hx ADHD/ depression / anxiety -Per primary  Disposition / Summary of Today's Plan 12/19/17   Trach collar as tolerated Currently day 10 of sutures in  place Noted to have scant bloody drainage from trach    Diet: TF  Pain/Anxiety/Delirium protocol: None DVT prophylaxis: SCD's GI prophylaxis: pepcid  Hyperglycemia protocol: None Mobility: BR Code Status: full Family Communication: 9/30 spoke with son, reviewed patients overall state of health.  He is very articulate in his understanding of her illness.   He requests that we continue full support / proceed with trach as he wishes to give his little sister as much time with her mother as possible (she is 7).   10/7 no family available.  12/19/2017 no family at bedside  Labs   CBC: Recent Labs  Lab 12/14/17 0256 12/14/17 1445 12/16/17 0403 12/17/17 0644 12/18/17 0426 12/19/17 0352  WBC 9.4 9.5 10.1 10.4 10.8* 11.0*  NEUTROABS 5.9  --   --   --   --   --   HGB 7.3* 8.0* 8.3* 8.5* 8.5* 8.2*  HCT 24.4* 26.0* 26.8* 28.0* 28.2* 28.0*  MCV 82.2 81.5 81.0 80.2 81.5 81.9  PLT 393 398 454* 485* 488* 473*   Basic Metabolic Panel: Recent Labs  Lab 12/13/17 0949 12/14/17 0256 12/14/17 1445 12/16/17 0403 12/17/17 0644 12/18/17 0426  NA 140  --  140 138 136 139  K 3.3*  --   --  3.8 3.9 3.9  CL 106  --   --  102 100 103  CO2 21*  --   --  27 26 25   GLUCOSE 167*  --   --  107* 120* 113*  BUN 17  --   --  16 14 17   CREATININE 0.71  --   --  0.75 0.81 0.75  CALCIUM 8.9  --   --  8.9 9.5 9.4  MG  --  2.2  --   --   --   --   PHOS  --  4.2  --   --   --   --    GFR: Estimated Creatinine Clearance: 81.9 mL/min (by C-G formula based on SCr of 0.75 mg/dL). Recent Labs  Lab 12/16/17 0403 12/17/17 0644 12/18/17 0426 12/19/17 0352  WBC 10.1 10.4 10.8* 11.0*   Liver Function Tests: No results for input(s): AST, ALT, ALKPHOS, BILITOT, PROT, ALBUMIN in the last 168 hours. No results for input(s): LIPASE, AMYLASE in the last 168 hours. No results for input(s): AMMONIA in the last 168 hours. ABG    Component Value Date/Time   PHART 7.444 12/08/2017 0414   PCO2ART 36.7 12/08/2017  0414   PO2ART 109 (H) 12/08/2017 0414   HCO3 24.6 12/08/2017 0414   TCO2 27 12/05/2017 1216   ACIDBASEDEF 6.0 (H) 11/29/2017 0310   O2SAT 98.2 12/08/2017 0414    Coagulation Profile: No results for input(s): INR, PROTIME in the last 168 hours. Cardiac Enzymes: No results for input(s): CKTOTAL, CKMB, CKMBINDEX, TROPONINI in the last 168 hours. HbA1C: Hgb A1c MFr Bld  Date/Time Value Ref Range Status  11/28/2017 08:32 AM 5.8 (H) 4.8 - 5.6 % Final    Comment:    (NOTE) Pre diabetes:          5.7%-6.4% Diabetes:              >6.4% Glycemic control for   <7.0% adults with diabetes    CBG: Recent Labs  Lab 12/18/17 1631 12/18/17 1939 12/19/17 0003 12/19/17 0533 12/19/17 0758  GLUCAP 92 111* 103* 108* 105*    Steve Buna Cuppett ACNP [facility] PCC[facility] Pager [phone] till 1 pm If no answer page [phone] 12/19/2017, 11:14 AM

## 2017-12-19 NOTE — Progress Notes (Addendum)
NEUROHOSPITALISTS STROKE TEAM - DAILY PROGRESS NOTE   SUBJECTIVE PT working with pt - just getting her upright in her new bed for wt bearing. Not a LTACH candidate d/t lack of insurance. Remains stable. Helmet for delivery next week.   OBJECTIVE Most recent Vital Signs: Vitals:   12/19/17 0721 12/19/17 0739 12/19/17 1049 12/19/17 1126  BP:  (!) 139/96 (!) 153/99   Pulse: (!) 107 89 95 92  Resp: 19 (!) 22  (!) 23  Temp:  97.7 F (36.5 C)    TempSrc:  Axillary    SpO2: 100% 100%  99%  Weight:      Height:       CBG (last 3)  Recent Labs    12/19/17 0533 12/19/17 0758 12/19/17 1130  GLUCAP 108* 105* 125*   CBC Latest Ref Rng & Units 12/19/2017 12/18/2017 12/17/2017  WBC 4.0 - 10.5 K/uL 11.0(H) 10.8(H) 10.4  Hemoglobin 12.0 - 15.0 g/dL 8.2(L) 8.5(L) 8.5(L)  Hematocrit 36.0 - 46.0 % 28.0(L) 28.2(L) 28.0(L)  Platelets 150 - 400 K/uL 473(H) 488(H) 485(H)    BMP Latest Ref Rng & Units 12/18/2017 12/17/2017 12/16/2017  Glucose 70 - 99 mg/dL 113(H) 120(H) 107(H)  BUN 6 - 20 mg/dL 17 14 16   Creatinine 0.44 - 1.00 mg/dL 0.75 0.81 0.75  Sodium 135 - 145 mmol/L 139 136 138  Potassium 3.5 - 5.1 mmol/L 3.9 3.9 3.8  Chloride 98 - 111 mmol/L 103 100 102  CO2 22 - 32 mmol/L 25 26 27   Calcium 8.9 - 10.3 mg/dL 9.4 9.5 8.9    IMAGING No results found.  Physical Exam  HEENT-status post left frontoparietal craniotomy with wound clean dry, soft to touch, L cranial edema Cardiovascular- S1-S2 audible, pulses palpable throughout   Lungs- breath sounds clear bilaterally, Status post tracheostomy with trach collar, nonlabored breathing, has some light red drainage Abdomen- bowel sounds present, RLQ wound with staples out CDI musculoskeletal-no joint tenderness, deformity or swelling Skin-warm and dry Neurological Exam:  Awake, alert, sitting up in bed taking. Globally aphasic, does not respond to commands.  Eyes open, left gaze preference  decreasing, able to cross midline, PERRL. Not blinking to visual threat on the right. Right facial droop. Tongue protrusion not cooperative. LUE 5/5 and able to against gravity. LLE withdraw to pain 4/5. RUE 2/5 on pain stimuli and RLE 1/5 to pain. Increasing RUE tone. mitt on L hand. B/l babinski becoming muted. Sensation, coordination and gait not otherwise tested.   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 15 mm of left-to-right midline shift.  MRI of the brain: Post craniotomy 8-9 mm of right-to-left midline shift with mild left uncal herniation similar to previous. Underlying evolving large acute ischemic left MCA territory infarct involving the majority of the left MCA territory.  Repeat Head CT post evacuation 9/20 - Left MCA territory infarct with evacuated hematoma. Midline shift measures 8 mm.   UDS - positive for amphetamines ( Pt takes Adderal at home)  2D Echocardiogram EF 60 to 65%  LE venous doppler negative for DVT  Hypercoagulable labs negative except high ESR and  CRP  Consider TEE if she has more functional neuro improvement   LDL 64  HgbA1c 5.8  VTE: Heparin subq   On ASA 81, started on 12/04/17  Therapy recommendations: SNF  Disposition: Pending   helmet ordered - for delivery next week  New bed in place to increase mobility - PT starting to use today  SLP starting [place] trials  Cerebral edema, resolved  CT head midline shift 121m with uncal herniation  Repeat CT head s/p hematoma evacuation - midline shift 8521m S/p hemicrani 9/20  Repeat CT  9/21 - midline shift 21m47mOn keppra for seizure prevention  CT repeat 12/02/2017 stable midline shift, large left MCA infarct, residual hematoma  3% saline d/c'ed on 12/06/17  Acute respiratory failure in the setting of massive IPH  Status post tracheostomy on 12/09/17  Well on trach collar  Continue trach care per protocol  Intermittent chest x-rays  Chest x-ray done on 12/14/2017 shows no acute disease   secretions low but with bright red hemoptysis - s/p trach change per staff at bedside.   Labs slightly altered - WBC 11, Hgb 8.2 - will recheck Monday  Pulmonary saw today - is monitoring trach bleeding. Removed trach sutures  Seizure  Episode of stiffness, tongue biting  Overnight EEG no seizure  Increased keppra from 500 to 1000m421md  No more seizure since  Seizure precautions  Hypertension emergency  BP elevated on admission  BP goal <180   On norvasc 10, lisinopril 20 bid and labetalol 300 tid  Off minoxidil and HCTZ with lowering BPs  Remains 140-150s  Monitor BP   Anemia  Hb 11.8 on admission  Hemoglobin 7.3-->8.0->8.3->8.5->8.2  Received one unit PRBC during this admission    Continue to monitor  Iron study iron 36 and ferritin 66  Pt currently on  iron pills  PRBC transfusion if HGb < 7.0  Leukocytosis, mild increase  WBC 9.4-->9.5->10.4->11.0  No fever   cultures neg  Recheck Monday  Dysphagia, secondary to stroke Nutrition  PEG tube placed on 12/09/2017 for tube feeding  Accu-Chek every 4 hours while on tube feeding  On free water  On bolus tube feeds   Other Stroke Risk Factors  ETOH use   Other Active Problems  ADHD - on Adderell at home  Anxiety  Depression  Hypokalemia, resolved  Hospital day # 22  GastonN, APRN, ANVP-BC, AGPCNP-BC Advanced Practice Stroke Nurse [facility] Schedule & Pager information 12/19/2017 12:18 PM    ATTENDING NOTE: I reviewed above  note and agree with the assessment and plan. Pt was seen and examined.  No familyat bedside. No acute event overnight, neuro stable. continued moderate secretions. RT/speech are following. Still has slight leukocytosis and stable anemia today. BPstable. ontube feeding and trach collar. Continue aspirin and Keppra.PT/OT recommend SNF, and social workerisworking on placement.  JindRosalin Hawking PhD Stroke Neurology 12/19/2017 3:27 PM    To contact Stroke Continuity provider, please refer to [email or web address], contact General Neurology

## 2017-12-19 NOTE — Progress Notes (Signed)
Physical Therapy Treatment Patient Details Name: Tammie Miles MRN: [id number] DOB: 05-Sep-1975 Today's Date: 12/19/2017    History of Present Illness Patient is a 42 y/o female who was admitted for AMS/unresponsiveness. 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 Returned to OR 9/20 for decompressive crani with left skull harvested in abdomen. 9/25 developed seizures. Intubated 9/19-10/7. s/p trach and peg 10/1. PMH includes HTN, depression, anxiety.     PT Comments    Pt tolerated first trial in tilt bed. VSS throughout (see below for details). Pt remains on 28% trach collar during session with SPO2 maintaining at 95-96%. Please see "general comments" below for details regarding use of VitalGo Tilt bed with pt.   Pt would continue to benefit from skilled physical therapy services at this time while admitted and after d/c to address the below listed limitations in order to improve overall safety and independence with functional mobility.   Follow Up Recommendations  SNF     Equipment Recommendations  None recommended by PT    Recommendations for Other Services       Precautions / Restrictions Precautions Precautions: Fall;Other (comment) Precaution Comments: trach collar, PEG, no bone flap L skull Required Braces or Orthoses: Other Brace/Splint Other Brace/Splint: B Prevalon Boots Restrictions Weight Bearing Restrictions: No    Mobility  Bed Mobility Overal bed mobility: Needs Assistance             General bed mobility comments: focus of session was on using VitalGo Tilt Bed  Transfers                    Ambulation/Gait                 Stairs             Wheelchair Mobility    Modified Rankin (Stroke Patients Only) Modified Rankin (Stroke Patients Only) Pre-Morbid Rankin Score: No  symptoms Modified Rankin: Severe disability     Balance                                            Cognition Arousal/Alertness: Lethargic Behavior During Therapy: Flat affect Overall Cognitive Status: Difficult to assess Area of Impairment: Following commands                       Following Commands: Follows one step commands with increased time;Follows one step commands inconsistently              Exercises      General Comments General comments (skin integrity, edema, etc.): Pt tolerated tilt bed to as high as 45 degrees; pt stayed 2 mins at 15 degrees (BP=136/88, HR=106), 2 mins at 25 degrees (BP=132/92, HR=101), 5 mins at 35 degrees (BP=133/93, HR=108) and 10 minutes at 45 degrees (BP=121/85, HR=100). While in 45 degrees of tilt, pt intermittently lifting her head and shoulders off of the support surface appearing to look around (mostly towards her L side). Pt also participated in L UE target touching directly in front and successfully touched therapist's hand x1      Pertinent Vitals/Pain Pain Assessment: Faces Faces Pain Scale: No hurt    Home Living  Prior Function            PT Goals (current goals can now be found in the care plan section) Acute Rehab PT Goals PT Goal Formulation: Patient unable to participate in goal setting Time For Goal Achievement: 12/30/17 Potential to Achieve Goals: Fair Progress towards PT goals: Progressing toward goals    Frequency    Min 3X/week      PT Plan Current plan remains appropriate    Co-evaluation              AM-PAC PT "6 Clicks" Daily Activity  Outcome Measure  Difficulty turning over in bed (including adjusting bedclothes, sheets and blankets)?: Unable Difficulty moving from lying on back to sitting on the side of the bed? : Unable Difficulty sitting down on and standing up from a chair with arms (e.g., wheelchair, bedside commode, etc,.)?:  Unable Help needed moving to and from a bed to chair (including a wheelchair)?: Total Help needed walking in hospital room?: Total Help needed climbing 3-5 steps with a railing? : Total 6 Click Score: 6    End of Session Equipment Utilized During Treatment: Oxygen;Other (comment)(trach collar 28%) Activity Tolerance: Patient tolerated treatment well Patient left: in bed;with call bell/phone within reach Nurse Communication: Mobility status;Need for lift equipment PT Visit Diagnosis: Hemiplegia and hemiparesis Hemiplegia - Right/Left: Right Hemiplegia - caused by: Nontraumatic intracerebral hemorrhage     Time: 1011-1054 PT Time Calculation (min) (ACUTE ONLY): 43 min  Charges:  $Therapeutic Activity: 38-52 mins                     Deborah Chalk, PT, DPT  Acute Rehabilitation Services Pager [phone] Office [phone]     Alessandra Bevels Aliese Brannum 12/19/2017, 2:36 PM

## 2017-12-20 LAB — GLUCOSE, CAPILLARY
GLUCOSE-CAPILLARY: 110 mg/dL — AB (ref 70–99)
GLUCOSE-CAPILLARY: 128 mg/dL — AB (ref 70–99)
Glucose-Capillary: 135 mg/dL — ABNORMAL HIGH (ref 70–99)
Glucose-Capillary: 144 mg/dL — ABNORMAL HIGH (ref 70–99)
Glucose-Capillary: 132 mg/dL — ABNORMAL HIGH (ref 70–99)

## 2017-12-20 NOTE — Evaluation (Signed)
Tammie Miles Speaking Valve - Evaluation Patient Details  Name: Tammie Miles MRN: [id number] Date of Birth: 01-26-1976  Today's Date: 12/20/2017 Time: 1359-1405 SLP Time Calculation (min) (ACUTE ONLY): 6 min  Past Medical History:  Past Medical History:  Diagnosis Date  . ADHD   . Anxiety   . Depression   . Hypertension    Past Surgical History: The histories are not reviewed yet. Please review them in the "History" navigator section and refresh this SmartLink. HPI:  [name] with PMH significant for HTN, anxiety, ADHD, admitted 9/19 with large L hemorrhagic basal ganglia with 15 mm shift with early herniation s/p L frontotemporal crani 9/19 for evacuation of hematoma. ETT 9/19; 9/20 returned to OR for decompressive craniectomy ; 10/1 trach/PEG;  transitioned to TC and off vent 10/5.  CT 9/24: 1. Left MCA distribution infarction is stable in distribution. Mild increased edema of the infarct with increased herniation of the left frontal lobe via the left frontal craniectomy. 2. Stable 6 mm left-to-right midline shift and partial effacement of left lateral ventricle. 3. Stable small hematoma within the left basal ganglia.  CXR 10/9: no acute disease process.  Pt trach changed yesterday to Shiley 6 cuffless   Assessment / Plan / Recommendation Clinical Impression  Pt oriented to sounds and intermittently made eye contact with SLP.  Pt was unable to follow commands. Pt appeared to be resting comfortably in bed with O2 delivered by trach collar. On SLP arrival SpO2 at 99 and HR at 91.  Vitals remained consistent throughout evaluation, with only a slight change in HR to mid 80s noted.  Pt was unable to pass air through upper airway to finger occlusion across 5 attempts.  Pt did not open mouth, and no air was felt to pass through nares. There was no visual pertubation of tissue placed near nostrils during finger occlusion of trach.  Pt did not exhibit any distress/discomfort with brief finger occlusion  of trach.  No attempts by pt at phonation or reflexive cough noted.  Pt is not appropriate for PMV placement at this time.  SLP will continue trials as appropriate. No PMSV left in room.  Tammie Miles was changed to a Shiley #6 cuffless yesterday.  Pt may benefit from changing to a smaller size if medically appropriate.  SLP Visit Diagnosis: Aphonia (R49.1)    SLP Assessment  Patient needs continued Speech Lanaguage Pathology Services    Follow Up Recommendations  (Pt will likely need continued ST at next level of care)    Frequency and Duration min 1 x/week  2 weeks    PMSV Trial PMSV was placed for: On SLP arrival SpO2 at 99 and HR at 91.  Vitals remained consistent throughout evaluation, change in HR only to mid 80s.  Pt was unable to pass air through upper airway to finger occlusion across 5 attempts.  No attempts by pt at phonation or reflexive cough noted.  Pt is not appropriate for PMV placement at this time.  SLP will continue trials as appropriate.  Able to redirect subglottic air through upper airway: No Able to Attain Phonation: No attempt to phonate Able to Expectorate Secretions: No attempts   Tracheostomy Tube       Vent Dependency  FiO2 (%): 28 %    Cuff Deflation Trial  GO          Tammie Pleasure, MA, CCC-SLP Acute Rehabilitation Services Office: [phone] 12/20/2017, 2:40 PM

## 2017-12-20 NOTE — Progress Notes (Signed)
NEUROHOSPITALISTS STROKE TEAM - DAILY PROGRESS NOTE   SUBJECTIVE Pt RN is at bedside. No acute event overnight. Still has global aphasia and right hemiparesis. Down sized trach yesterday.   OBJECTIVE Most recent Vital Signs: Vitals:   12/19/17 2358 12/20/17 0314 12/20/17 0422 12/20/17 0738  BP:  125/79  (!) 132/97  Pulse:  82  78  Resp:  20  20  Temp:  98.5 F (36.9 C)  98.2 F (36.8 C)  TempSrc:  Oral  Oral  SpO2: 98% 100% 100% 100%  Weight:      Height:       CBG (last 3)  Recent Labs    12/19/17 1930 12/19/17 2319 12/20/17 0316  GLUCAP 129* 161* 110*   CBC Latest Ref Rng & Units 12/19/2017 12/18/2017 12/17/2017  WBC 4.0 - 10.5 K/uL 11.0(H) 10.8(H) 10.4  Hemoglobin 12.0 - 15.0 g/dL 8.2(L) 8.5(L) 8.5(L)  Hematocrit 36.0 - 46.0 % 28.0(L) 28.2(L) 28.0(L)  Platelets 150 - 400 K/uL 473(H) 488(H) 485(H)    BMP Latest Ref Rng & Units 12/18/2017 12/17/2017 12/16/2017  Glucose 70 - 99 mg/dL 113(H) 120(H) 107(H)  BUN 6 - 20 mg/dL 17 14 16   Creatinine 0.44 - 1.00 mg/dL 0.75 0.81 0.75  Sodium 135 - 145 mmol/L 139 136 138  Potassium 3.5 - 5.1 mmol/L 3.9 3.9 3.8  Chloride 98 - 111 mmol/L 103 100 102  CO2 22 - 32 mmol/L 25 26 27   Calcium 8.9 - 10.3 mg/dL 9.4 9.5 8.9    IMAGING Ct Angio Head W Or Wo Contrast  Addendum Date: 11/27/2017   ADDENDUM REPORT: 11/27/2017 11:17 ADDENDUM: Study discussed by telephone with Neurosurgeon Dr. Consuella Lose on 11/27/2017 at 1108 hours. Electronically Signed   By: Genevie Ann M.D.   On: 11/27/2017 11:17   Result Date: 11/27/2017 CLINICAL DATA:  42 year old female found unresponsive with relatively large intra-axial hemorrhage in the left hemisphere on noncontrast head CT. EXAM: CT ANGIOGRAPHY HEAD TECHNIQUE: Multidetector CT imaging of the head was performed using the standard protocol during bolus administration of intravenous contrast. Multiplanar CT image reconstructions and MIPs were obtained to  evaluate the vascular anatomy. CONTRAST:  66m ISOVUE-370 IOPAMIDOL (ISOVUE-370) INJECTION 76% COMPARISON:  Head CT without contrast 0938 hours today. FINDINGS: Posterior circulation: Mildly dominant distal left vertebral artery. Patent vertebral arteries to the vertebrobasilar junction without stenosis. Patent left PICA and dominant appearing right AICA origins. Patent basilar artery without stenosis. Mild tortuosity at the basilar tip. SCA and PCA origins are within normal limits. Posterior communicating arteries are diminutive or absent. Bilateral PCA branches are within normal limits. 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 Smooth tapering of the vessel. No right ICA siphon atherosclerosis or stenosis identified. Patent right ICA terminus. Normal right ophthalmic artery origin. Similar but lesser caliber variation in the distal left ICA which measures up to 6 millimeters diameter (coronal series 7, images 88 and 89. No left ICA siphon atherosclerosis, but there is moderate to severe vessel wall irregularity and stenosis in the supraclinoid segment (series 9, image 92 best demonstrated series 9, images 92-95. Despite this the left ICA terminus is patent. Both ACA A1 segments are diminutive but patent. There is mass effect on the left scratched at there is mass effect on the bilateral ACA branches which appear patent and within normal limits. The right MCA M1 segment, right MCA trifurcation, and right MCA branches  are within normal limits. The left MCA M1 segment is occluded about 6 millimeters beyond its origin (series 8, image 16) with poor reconstituted enhancement in the left MCA branches. No CTA spot sign is identified. No abnormal increased vascularity in the area of left hemisphere hematoma. Venous sinuses: Early contrast timing, but grossly patent superior sagittal  sinus. Anatomic variants: Dominant distal left vertebral artery. Other findings: Estimated left hemisphere intra-axial hemorrhage volume 43 milliliters (60 x 33 x 43 millimeters AP by transverse by CC). No intraventricular or extra-axial extension of blood is evident. Intracranial mass effect appears stable with rightward midline shift up to 16 millimeters. Substantial mass effect on the lateral ventricles redemonstrated. The right lateral ventricle appears trapped. Review of the MIP images confirms the above findings IMPRESSION: 1. Positive for left MCA M1 large vessel occlusion, and also severe stenosis at the left ICA terminus, superimposed on the relatively large acute left hemisphere intra-axial hemorrhage (estimated blood volume 43 mL). Vasospasm suspected in the proximal ACAs. 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 2. Negative for intracranial aneurysm, CTA spot sign, or evidence of vascular malformation in association with the acute hemorrhage. 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 3. Stable intracranial mass effect since 0938 hours today, with 16 mm of leftward midline shift and trapping of the right lateral ventricle. Study briefly discussed by telephone with Dr. Venora Maples in the ED on 11/27/2017 at 10:55. He advises the patient has now been taken to the neuro operating room, and I am now attempting to contact the Neurosurgeon regarding these findings. Electronically Signed: By: Genevie Ann M.D. On: 11/27/2017 10:58   Ct Head Wo Contrast  Result Date: 12/02/2017 CLINICAL DATA:  42 y/o  F; intracranial hemorrhage for follow-up. EXAM: CT HEAD WITHOUT CONTRAST TECHNIQUE: Contiguous axial images were obtained from the base of the skull through the vertex without intravenous contrast. COMPARISON:  11/29/2017 CT head.  FINDINGS: Brain: Stable distribution of left MCA infarction. Mild interval increase in edema a mildly increased herniation of the left frontal lobe the craniectomy. Stable 6 mm left-to-right midline shift and partial effacement of the left lateral ventricle. Stable hematoma within the left basal ganglia post partial evacuation. No new acute intracranial hemorrhage, stroke, or focal mass effect. Vascular: No hyperdense vessel or unexpected calcification. Skull: Left frontal craniectomy postsurgical changes. Decreased edema and interval resolution of soft tissue air and pneumocephalus. Sinuses/Orbits: Aerosolized secretions in the right sphenoid sinus. Additional visualized paranasal sinuses and the mastoid air cells are normally aerated. Other: None. IMPRESSION: 1. Left MCA distribution infarction is stable in distribution. Mild increased edema of the infarct with increased herniation of the left frontal lobe via the left frontal craniectomy. 2. Stable 6 mm left-to-right midline shift and partial effacement of left lateral ventricle. 3. Stable small hematoma within the left basal ganglia. 4. No new acute intracranial abnormality identified. Electronically Signed   By: Kristine Garbe M.D.   On: 12/02/2017 03:08   Ct Head Wo Contrast  Result Date: 11/29/2017 CLINICAL DATA:  42 y/o  F; stroke for follow-up. EXAM: CT HEAD WITHOUT CONTRAST TECHNIQUE: Contiguous axial images were obtained from the base of the skull through the vertex without intravenous contrast. COMPARISON:  11/27/2017 MRI head.  11/28/2017 CT head. FINDINGS: Brain: Large left MCA infarction is stable in distribution. Interval left frontal craniectomy with partial herniation of the  left frontal lobe through the craniectomy defect. Interval resolution of uncal herniation, improved patency of the left lateral ventricle, and 6 mm left-to-right midline shift, previously 8 mm. Partial interval dispersion of acute hemorrhage in the left basal  ganglia post partial evacuation. There is a subcentimeter density anterior to the prior position of blood products possibly representing retracted clot or interval hemorrhage (series 3, image 19), attention at follow-up recommended. Vascular: No hyperdense vessel or unexpected calcification. Skull: Interval left frontal craniectomy with edema and several foci of air in the overlying scalp as well as skin staples. Sinuses/Orbits: Possible right sphenoid sinus opacification with aerosolized secretions. Normal aeration of the mastoid air cells. Other: None. IMPRESSION: 1. Large left MCA infarct is stable in distribution. 2. Interval left frontal craniectomy with mild herniation of left frontal lobe through the defect. 3. Resolved uncal herniation, improved patency of left lateral ventricle, and 6 mm left-to-right midline shift, previously 8 mm. 4. Partial interval dispersion of acute hemorrhage in the left basal ganglia. Subcentimeter density anterior to the prior position of blood products possibly representing retracted clot or interval hemorrhage, attention at follow-up recommended. Electronically Signed   By: Kristine Garbe M.D.   On: 11/29/2017 06:00   Ct Head Wo Contrast  Result Date: 11/28/2017 CLINICAL DATA:  Stroke follow-up EXAM: CT HEAD WITHOUT CONTRAST TECHNIQUE: Contiguous axial images were obtained from the base of the skull through the vertex without intravenous contrast. COMPARISON:  Yesterday FINDINGS: Brain: Acute infarct in the entirety of the left MCA territory. There was superimposed hematoma requiring craniotomy. Residual blood products are stable at 18 mm on axial slices. There is a very thin subdural hematoma along the right cerebral convexity, stable at 2 to 3 mm. Cytotoxic edema continues to cause 8 mm of midline shift at the septum pellucidum. No ventriculomegaly. No evidence of new infarct. Vascular: Negative Skull: Unremarkable left frontal craniotomy. Sinuses/Orbits: Small  volume secretions in the right sphenoid sinus IMPRESSION: 1. Stable when compared to yesterday. 2. Left MCA territory infarct with evacuated hematoma. Midline shift measures 8 mm. 3. 2-3 mm subdural hematoma along the right cerebral convexity without interval increase. Electronically Signed   By: Monte Fantasia M.D.   On: 11/28/2017 09:20   Ct Head Wo Contrast  Result Date: 11/27/2017 CLINICAL DATA:  Follow up intracranial hemorrhage. Status post LEFT frontotemporal craniotomy. EXAM: CT HEAD WITHOUT CONTRAST TECHNIQUE: Contiguous axial images were obtained from the base of the skull through the vertex without intravenous contrast. COMPARISON:  CT HEAD November 27, 2017 FINDINGS: BRAIN: Interval evacuation LEFT basal ganglia hematoma was small amount of residual blood products with extending along the surgical approach. Decreased surrounding edema. Small amount of LEFT frontal intraparenchymal and extra-axial pneumocephalus. 7 mm LEFT-to-RIGHT midline shift, decreased from 15 mm. Re-expanded LEFT lateral ventricle. Resolved RIGHT ventricular entrapment. No hydrocephalus. No acute large vascular territory infarcts. Trace RIGHT holo hemispheric and tentorial subdural hematoma. Global edema with effaced basal cisterns. VASCULAR: Unremarkable. SKULL/SOFT TISSUES: Status post LEFT frontal craniotomy. LEFT frontal hemangioma. LEFT scalp soft tissue swelling with subcutaneous gas and overlying skin staples. No significant soft tissue swelling. ORBITS/SINUSES: The included ocular globes and orbital contents are normal.Lobulated RIGHT sphenoid sinus mucosal thickening with small air-fluid level. Mastoid air cells are well aerated. OTHER: None. IMPRESSION: 1. Interval LEFT frontal craniotomy for basal ganglia hematoma evacuation, small amount of residual blood products and regional edema. 7 mm residual LEFT to RIGHT midline shift, resolved RIGHT ventricular entrapment. 2. Trace RIGHT holo hemispheric and tentorial  subdural hematomas. Global edema. Electronically Signed   By: Elon Alas M.D.   On: 11/27/2017 17:02   Ct Head Wo Contrast  Result Date: 11/27/2017 CLINICAL DATA:  Altered mental status EXAM: CT HEAD WITHOUT CONTRAST TECHNIQUE: Contiguous axial images were obtained from the base of the skull through the vertex without intravenous contrast. COMPARISON:  05/22/2009 FINDINGS: Brain: Large area of hemorrhage in the region of the left basal ganglia and extending into the left frontal lobe measuring approximately 5.8 x 3.5 cm. There is 15 mm of left-to-right midline shift. No hydrocephalus. Vascular: No hyperdense vessel or unexpected calcification. Skull: No acute calvarial abnormality. Sinuses/Orbits: Visualized paranasal sinuses and mastoids clear. Orbital soft tissues unremarkable. Other: None IMPRESSION: Large area of hemorrhage in the left basal ganglia and left frontal lobe, 5.8 x 3.5 cm. 15 mm of left-to-right midline shift. Critical Value/emergent results were called by telephone at the time of interpretation on 11/27/2017 at 9:56 am to Dr. Jola Schmidt , who verbally acknowledged these results. Electronically Signed   By: Rolm Baptise M.D.   On: 11/27/2017 09:57   Mr Brain Wo Contrast  Result Date: 11/28/2017 CLINICAL DATA:  Follow-up examination for intracranial hemorrhage. EXAM: MRI HEAD WITHOUT CONTRAST TECHNIQUE: Multiplanar, multiecho pulse sequences of the brain and surrounding structures were obtained without intravenous contrast. COMPARISON:  Prior CT and CTA from earlier the same day. FINDINGS: Brain: Postoperative changes from recent left frontal craniotomy for evacuation of left basal ganglia hematoma again seen. Residual fluid and blood products seen within the resection cavity with probable superimposed scattered foci of pneumocephalus. Overall, size relatively stable from recent exams. Surrounding vasogenic edema and regional mass effect with attenuation of the adjacent left lateral  ventricle. Persistent 8-9 mm of left-to-right midline shift with left uncal herniation (series 15, image 16). Basilar cisterns remain patent. No hydrocephalus or ventricular trapping. Additional small right holo hemispheric subdural hematoma measures up to 3 mm in maximal thickness. Extension along the tentorium again noted. Underlying extensive cytotoxic edema involving the majority of the left MCA territory with involvement of the left frontal, parietal, and temporal occipital lobes, consistent with underlying evolving acute left MCA territory infarct. Associated gyral swelling and edema throughout the area of infarction. No other parenchymal hemorrhage outside the area of basal ganglia hemorrhage. No underlying mass lesion. Remainder of the brain is relatively normal in appearance. Pituitary gland normal. Vascular: Major intracranial vascular flow voids are maintained Skull and upper cervical spine: Craniocervical junction within normal limits. No transtentorial herniation. Upper cervical spine normal. No focal marrow replacing lesion. Sequelae of prior left frontal craniotomy. Skin staples remain in place. Sinuses/Orbits: Globes and orbital soft tissues within normal limits. Right sphenoid sinus disease noted. Paranasal sinuses are otherwise largely clear. Trace right mastoid effusion, of doubtful significance. Other: None. IMPRESSION: 1. Postoperative changes from previous left frontal craniotomy for evacuation of left basal ganglia hematoma. Residual blood products within the evacuation cavity with persistent surrounding vasogenic edema and regional mass effect. Associated 8-9 mm of right-to-left midline shift with mild left uncal herniation similar to previous. No hydrocephalus or ventricular trapping. 2. Underlying evolving large acute ischemic left MCA territory infarct involving the majority of the left MCA territory. 3. Trace right cerebral and tentorial subdural hematoma without mass effect. 4. Right  sphenoid sinusitis. Electronically Signed   By: Jeannine Boga M.D.   On: 11/28/2017 02:01    Physical Exam  HEENT-status post left frontoparietal craniotomy with wound clean dry, soft to touch, L cranial edema  Cardiovascular- S1-S2 audible, pulses palpable throughout   Lungs- breath sounds clear bilaterally, Status post tracheostomy with trach collar, nonlabored breathing, has some light red drainage Abdomen- bowel sounds present, RLQ wound CDI musculoskeletal-no joint tenderness, deformity or swelling Skin-warm and dry Neurological Exam:  Awake, alert, globally aphasic, does not follow commands.  Eyes open, left gaze preference improved, able to cross midline, PERRL, mild left ptosis. Not blinking to visual threat on the right, but able to trak objects on the right. Right facial droop. Tongue protrusion not cooperative. LUE 5/5 and able to against gravity. LLE withdraw to pain 4/5. RUE 2-/5 on pain stimuli and RLE 2/5 to pain. Increasing RUE tone. B/l babinski becoming muted. Sensation, coordination and gait not otherwise tested.   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 15 mm of left-to-right midline shift.  MRI of the brain: Post craniotomy 8-9 mm of right-to-left midline shift with mild left uncal herniation similar to previous. Underlying evolving large acute ischemic left MCA territory infarct involving the majority of the left MCA territory.  Repeat Head CT post evacuation 9/20 - Left MCA territory infarct with evacuated hematoma. Midline shift measures 8 mm.   UDS - positive  for amphetamines ( Pt takes Adderal at home)  2D Echocardiogram EF 60 to 65%  LE venous doppler negative for DVT  Hypercoagulable labs negative except high ESR and CRP  May consider TEE if she has more functional improvement   LDL 64  HgbA1c 5.8  VTE: Heparin subq   On ASA 81 for stroke prevention  Therapy recommendations: SNF  Disposition: Pending   helmet ordered - for delivery next week  New bed in place to increase mobility  SLP working on [employer] trials  Acute respiratory failure in the setting of massive IPH  Status post tracheostomy on 12/09/17  Well on trach collar  Continue trach care per protocol  Intermittent chest x-rays  Chest x-ray done on 12/14/2017 shows no acute disease   secretions moderate - s/p trach change per staff at bedside.   Labs slightly altered - WBC 11, Hgb 8.2 - will recheck Monday  Trach tube downsized and replaced  Seizure  Episode of stiffness, tongue biting on 12/03/17  Overnight EEG no seizure  Increased keppra from 500 to 1026m bid  No more seizure since  Seizure precautions  Hypertension emergency  Stable now at 120-130s  BP elevated on admission  BP goal normotensive  On norvasc 10, lisinopril 20 bid and labetalol 300 tid  Off minoxidil and HCTZ    Anemia  Hb 11.8 on admission  Hemoglobin 7.3-->8.0->8.3->8.5->8.2 - recheck Monday  Received one unit PRBC during this admission    Continue to monitor  Iron study iron 36 and ferritin 66  Pt currently on  iron pills  PRBC transfusion if HGb < 7.0  Leukocytosis, mild increase  WBC 9.4-->9.5->10.4->11.0  No fever   cultures neg  Recheck Monday  Dysphagia, secondary to stroke  PEG tube placed on 12/09/2017 for tube feeding  Accu-Chek every 4 hours while on tube feeding  On free water  On bolus  tube feeds   Other Stroke Risk Factors  ETOH use   Other Active Problems  ADHD - on Adderell at  home  Anxiety  Depression  Hypokalemia, resolved  Hospital day # 23  Tammie Hawking, MD PhD Stroke Neurology 12/20/2017 11:58 AM       To contact Stroke Continuity provider, please refer to [email or web address] After hours, contact General Neurology

## 2017-12-20 NOTE — Progress Notes (Signed)
  Speech Language Pathology Treatment: Dysphagia  Patient Details Name: Tammie Miles MRN: [id number] DOB: Jan 05, 1976 Today's Date: 12/20/2017 Time: 4098-1191 SLP Time Calculation (min) (ACUTE ONLY): 10 min  Assessment / Plan / Recommendation Clinical Impression  Pt unable to follow directions for oral more exam.  Pt did respond to thermal stimulation of ice chips to lips and accept ice chips from spoon.  Pt exhibited good tolerance of ice chips.  With small amounts of liquid by teaspoon, pt exhibited immediate wet throat clear on 1 of 3 trials.  Vocal quality could not be assessed 2/2 trach placement; however, increase in wet breath sounds noted following trials of thin liquid.   Recommend pt remain NPO at present with alternate means of nutrition, hydration, and medication.  SLP to continue PO trials as indicated.  Pt is not appropriate for instrumental evaluation of swallowing at this time.   HPI HPI: 30 yoF with PMH significant for HTN, anxiety, ADHD, admitted 9/19 with large L hemorrhagic basal ganglia with 15 mm shift with early herniation s/p L frontotemporal crani 9/19 for evacuation of hematoma. ETT 9/19; 9/20 returned to OR for decompressive craniectomy ; 10/1 trach/PEG; transitioned to TC and off vent 10/5. CT 9/24: 1. Left MCA distribution infarction is stable in distribution. Mild increased edema of the infarct with increased herniation of the left frontal lobe via the left frontal craniectomy. 2. Stable 6 mm left-to-right midline shift and partial effacement of left lateral ventricle. 3. Stable small hematoma within the left basal ganglia. CXR 10/9: no acute disease process. Pt trach changed yesterday to Shiley 6 cuffless      SLP Plan     Patient needs continued Speech Lanaguage Pathology Services    Recommendations  Diet recommendations: NPO Medication Administration: Via alternative means      Patient may use Passy-Muir Speech Valve: with SLP only MD: Please consider  changing trach tube to : Smaller size         Follow up Recommendations: (Pt will likely need continued ST at next level of care) SLP Visit Diagnosis: Dysphagia, oropharyngeal phase (R13.12)       GO                Kerrie Pleasure, MA, CCC-SLP Acute Rehabilitation Services Office: [phone] 12/20/2017, 2:49 PM

## 2017-12-21 LAB — GLUCOSE, CAPILLARY
GLUCOSE-CAPILLARY: 108 mg/dL — AB (ref 70–99)
GLUCOSE-CAPILLARY: 129 mg/dL — AB (ref 70–99)
GLUCOSE-CAPILLARY: 93 mg/dL (ref 70–99)
Glucose-Capillary: 102 mg/dL — ABNORMAL HIGH (ref 70–99)
Glucose-Capillary: 120 mg/dL — ABNORMAL HIGH (ref 70–99)
Glucose-Capillary: 135 mg/dL — ABNORMAL HIGH (ref 70–99)

## 2017-12-21 MED ORDER — CHLORHEXIDINE GLUCONATE 0.12 % MT SOLN
OROMUCOSAL | Status: AC
  Administered 2017-12-21: 15 mL via OROMUCOSAL
  Filled 2017-12-21: qty 15

## 2017-12-21 MED ORDER — WHITE PETROLATUM EX OINT
TOPICAL_OINTMENT | CUTANEOUS | Status: AC
  Administered 2017-12-21: 15:00:00
  Filled 2017-12-21: qty 28.35

## 2017-12-21 NOTE — Progress Notes (Signed)
NEUROHOSPITALISTS STROKE TEAM - DAILY PROGRESS NOTE   SUBJECTIVE Pt RN is at bedside. No acute event overnight. Still has global aphasia and right hemiparesis. Down sized trach yesterday.   OBJECTIVE Most recent Vital Signs: Vitals:   12/21/17 0907 12/21/17 1130 12/21/17 1240 12/21/17 1524  BP:   127/83 140/86  Pulse: 84 85 93 87  Resp: 20 20 20 20   Temp:   98 F (36.7 C) 98.7 F (37.1 C)  TempSrc:   Axillary Oral  SpO2: 100% 98% 96% 99%  Weight:      Height:       CBG (last 3)  Recent Labs    12/21/17 0319 12/21/17 0738 12/21/17 1136  GLUCAP 108* 102* 120*   CBC Latest Ref Rng & Units 12/19/2017 12/18/2017 12/17/2017  WBC 4.0 - 10.5 K/uL 11.0(H) 10.8(H) 10.4  Hemoglobin 12.0 - 15.0 g/dL 8.2(L) 8.5(L) 8.5(L)  Hematocrit 36.0 - 46.0 % 28.0(L) 28.2(L) 28.0(L)  Platelets 150 - 400 K/uL 473(H) 488(H) 485(H)    BMP Latest Ref Rng & Units 12/18/2017 12/17/2017 12/16/2017  Glucose 70 - 99 mg/dL 113(H) 120(H) 107(H)  BUN 6 - 20 mg/dL 17 14 16   Creatinine 0.44 - 1.00 mg/dL 0.75 0.81 0.75  Sodium 135 - 145 mmol/L 139 136 138  Potassium 3.5 - 5.1 mmol/L 3.9 3.9 3.8  Chloride 98 - 111 mmol/L 103 100 102  CO2 22 - 32 mmol/L 25 26 27   Calcium 8.9 - 10.3 mg/dL 9.4 9.5 8.9    IMAGING Ct Angio Head W Or Wo Contrast  Addendum Date: 11/27/2017   ADDENDUM REPORT: 11/27/2017 11:17 ADDENDUM: Study discussed by telephone with Neurosurgeon Dr. Consuella Lose on 11/27/2017 at 1108 hours. Electronically Signed   By: Genevie Ann M.D.   On: 11/27/2017 11:17   Result Date: 11/27/2017 CLINICAL DATA:  42 year old female found unresponsive with relatively large intra-axial hemorrhage in the left hemisphere on noncontrast head CT. EXAM: CT ANGIOGRAPHY HEAD TECHNIQUE: Multidetector CT imaging of the head was performed using the standard protocol during bolus administration of intravenous contrast. Multiplanar CT image reconstructions and MIPs were obtained  to evaluate the vascular anatomy. CONTRAST:  45m ISOVUE-370 IOPAMIDOL (ISOVUE-370) INJECTION 76% COMPARISON:  Head CT without contrast 0938 hours today. FINDINGS: Posterior circulation: Mildly dominant distal left vertebral artery. Patent vertebral arteries to the vertebrobasilar junction without stenosis. Patent left PICA and dominant appearing right AICA origins. Patent basilar artery without stenosis. Mild tortuosity at the basilar tip. SCA and PCA origins are within normal limits. Posterior communicating arteries are diminutive or absent. Bilateral PCA branches are within normal limits. 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 Smooth tapering of the vessel. No right ICA siphon atherosclerosis or stenosis identified. Patent right ICA terminus. Normal right ophthalmic artery origin. Similar but lesser caliber variation in the distal left ICA which measures up to 6 millimeters diameter (coronal series 7, images 88 and 89. No left ICA siphon atherosclerosis, but there is moderate to severe vessel wall irregularity and stenosis in the supraclinoid segment (series 9, image 92 best demonstrated series 9, images 92-95. Despite this the left ICA terminus is patent. Both ACA A1 segments are diminutive but patent. There is mass effect on the left scratched at there is mass effect on the bilateral ACA branches which appear patent and within normal limits. The right MCA M1 segment, right MCA trifurcation, and right MCA branches are  within normal limits. The left MCA M1 segment is occluded about 6 millimeters beyond its origin (series 8, image 16) with poor reconstituted enhancement in the left MCA branches. No CTA spot sign is identified. No abnormal increased vascularity in the area of left hemisphere hematoma. Venous sinuses: Early contrast timing, but grossly patent superior sagittal  sinus. Anatomic variants: Dominant distal left vertebral artery. Other findings: Estimated left hemisphere intra-axial hemorrhage volume 43 milliliters (60 x 33 x 43 millimeters AP by transverse by CC). No intraventricular or extra-axial extension of blood is evident. Intracranial mass effect appears stable with rightward midline shift up to 16 millimeters. Substantial mass effect on the lateral ventricles redemonstrated. The right lateral ventricle appears trapped. Review of the MIP images confirms the above findings IMPRESSION: 1. Positive for left MCA M1 large vessel occlusion, and also severe stenosis at the left ICA terminus, superimposed on the relatively large acute left hemisphere intra-axial hemorrhage (estimated blood volume 43 mL). Vasospasm suspected in the proximal ACAs. 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 2. Negative for intracranial aneurysm, CTA spot sign, or evidence of vascular malformation in association with the acute hemorrhage. 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 3. Stable intracranial mass effect since 0938 hours today, with 16 mm of leftward midline shift and trapping of the right lateral ventricle. Study briefly discussed by telephone with Dr. Venora Maples in the ED on 11/27/2017 at 10:55. He advises the patient has now been taken to the neuro operating room, and I am now attempting to contact the Neurosurgeon regarding these findings. Electronically Signed: By: Genevie Ann M.D. On: 11/27/2017 10:58   Ct Head Wo Contrast  Result Date: 12/02/2017 CLINICAL DATA:  42 y/o  F; intracranial hemorrhage for follow-up. EXAM: CT HEAD WITHOUT CONTRAST TECHNIQUE: Contiguous axial images were obtained from the base of the skull through the vertex without intravenous contrast. COMPARISON:  11/29/2017 CT head.  FINDINGS: Brain: Stable distribution of left MCA infarction. Mild interval increase in edema a mildly increased herniation of the left frontal lobe the craniectomy. Stable 6 mm left-to-right midline shift and partial effacement of the left lateral ventricle. Stable hematoma within the left basal ganglia post partial evacuation. No new acute intracranial hemorrhage, stroke, or focal mass effect. Vascular: No hyperdense vessel or unexpected calcification. Skull: Left frontal craniectomy postsurgical changes. Decreased edema and interval resolution of soft tissue air and pneumocephalus. Sinuses/Orbits: Aerosolized secretions in the right sphenoid sinus. Additional visualized paranasal sinuses and the mastoid air cells are normally aerated. Other: None. IMPRESSION: 1. Left MCA distribution infarction is stable in distribution. Mild increased edema of the infarct with increased herniation of the left frontal lobe via the left frontal craniectomy. 2. Stable 6 mm left-to-right midline shift and partial effacement of left lateral ventricle. 3. Stable small hematoma within the left basal ganglia. 4. No new acute intracranial abnormality identified. Electronically Signed   By: Kristine Garbe M.D.   On: 12/02/2017 03:08   Ct Head Wo Contrast  Result Date: 11/29/2017 CLINICAL DATA:  42 y/o  F; stroke for follow-up. EXAM: CT HEAD WITHOUT CONTRAST TECHNIQUE: Contiguous axial images were obtained from the base of the skull through the vertex without intravenous contrast. COMPARISON:  11/27/2017 MRI head.  11/28/2017 CT head. FINDINGS: Brain: Large left MCA infarction is stable in distribution. Interval left frontal craniectomy with partial herniation of the left  frontal lobe through the craniectomy defect. Interval resolution of uncal herniation, improved patency of the left lateral ventricle, and 6 mm left-to-right midline shift, previously 8 mm. Partial interval dispersion of acute hemorrhage in the left basal  ganglia post partial evacuation. There is a subcentimeter density anterior to the prior position of blood products possibly representing retracted clot or interval hemorrhage (series 3, image 19), attention at follow-up recommended. Vascular: No hyperdense vessel or unexpected calcification. Skull: Interval left frontal craniectomy with edema and several foci of air in the overlying scalp as well as skin staples. Sinuses/Orbits: Possible right sphenoid sinus opacification with aerosolized secretions. Normal aeration of the mastoid air cells. Other: None. IMPRESSION: 1. Large left MCA infarct is stable in distribution. 2. Interval left frontal craniectomy with mild herniation of left frontal lobe through the defect. 3. Resolved uncal herniation, improved patency of left lateral ventricle, and 6 mm left-to-right midline shift, previously 8 mm. 4. Partial interval dispersion of acute hemorrhage in the left basal ganglia. Subcentimeter density anterior to the prior position of blood products possibly representing retracted clot or interval hemorrhage, attention at follow-up recommended. Electronically Signed   By: Kristine Garbe M.D.   On: 11/29/2017 06:00   Ct Head Wo Contrast  Result Date: 11/28/2017 CLINICAL DATA:  Stroke follow-up EXAM: CT HEAD WITHOUT CONTRAST TECHNIQUE: Contiguous axial images were obtained from the base of the skull through the vertex without intravenous contrast. COMPARISON:  Yesterday FINDINGS: Brain: Acute infarct in the entirety of the left MCA territory. There was superimposed hematoma requiring craniotomy. Residual blood products are stable at 18 mm on axial slices. There is a very thin subdural hematoma along the right cerebral convexity, stable at 2 to 3 mm. Cytotoxic edema continues to cause 8 mm of midline shift at the septum pellucidum. No ventriculomegaly. No evidence of new infarct. Vascular: Negative Skull: Unremarkable left frontal craniotomy. Sinuses/Orbits: Small  volume secretions in the right sphenoid sinus IMPRESSION: 1. Stable when compared to yesterday. 2. Left MCA territory infarct with evacuated hematoma. Midline shift measures 8 mm. 3. 2-3 mm subdural hematoma along the right cerebral convexity without interval increase. Electronically Signed   By: Monte Fantasia M.D.   On: 11/28/2017 09:20   Ct Head Wo Contrast  Result Date: 11/27/2017 CLINICAL DATA:  Follow up intracranial hemorrhage. Status post LEFT frontotemporal craniotomy. EXAM: CT HEAD WITHOUT CONTRAST TECHNIQUE: Contiguous axial images were obtained from the base of the skull through the vertex without intravenous contrast. COMPARISON:  CT HEAD November 27, 2017 FINDINGS: BRAIN: Interval evacuation LEFT basal ganglia hematoma was small amount of residual blood products with extending along the surgical approach. Decreased surrounding edema. Small amount of LEFT frontal intraparenchymal and extra-axial pneumocephalus. 7 mm LEFT-to-RIGHT midline shift, decreased from 15 mm. Re-expanded LEFT lateral ventricle. Resolved RIGHT ventricular entrapment. No hydrocephalus. No acute large vascular territory infarcts. Trace RIGHT holo hemispheric and tentorial subdural hematoma. Global edema with effaced basal cisterns. VASCULAR: Unremarkable. SKULL/SOFT TISSUES: Status post LEFT frontal craniotomy. LEFT frontal hemangioma. LEFT scalp soft tissue swelling with subcutaneous gas and overlying skin staples. No significant soft tissue swelling. ORBITS/SINUSES: The included ocular globes and orbital contents are normal.Lobulated RIGHT sphenoid sinus mucosal thickening with small air-fluid level. Mastoid air cells are well aerated. OTHER: None. IMPRESSION: 1. Interval LEFT frontal craniotomy for basal ganglia hematoma evacuation, small amount of residual blood products and regional edema. 7 mm residual LEFT to RIGHT midline shift, resolved RIGHT ventricular entrapment. 2. Trace RIGHT holo hemispheric and tentorial  subdural hematomas. Global edema. Electronically Signed   By: Elon Alas M.D.   On: 11/27/2017 17:02   Ct Head Wo Contrast  Result Date: 11/27/2017 CLINICAL DATA:  Altered mental status EXAM: CT HEAD WITHOUT CONTRAST TECHNIQUE: Contiguous axial images were obtained from the base of the skull through the vertex without intravenous contrast. COMPARISON:  05/22/2009 FINDINGS: Brain: Large area of hemorrhage in the region of the left basal ganglia and extending into the left frontal lobe measuring approximately 5.8 x 3.5 cm. There is 15 mm of left-to-right midline shift. No hydrocephalus. Vascular: No hyperdense vessel or unexpected calcification. Skull: No acute calvarial abnormality. Sinuses/Orbits: Visualized paranasal sinuses and mastoids clear. Orbital soft tissues unremarkable. Other: None IMPRESSION: Large area of hemorrhage in the left basal ganglia and left frontal lobe, 5.8 x 3.5 cm. 15 mm of left-to-right midline shift. Critical Value/emergent results were called by telephone at the time of interpretation on 11/27/2017 at 9:56 am to Dr. Jola Schmidt , who verbally acknowledged these results. Electronically Signed   By: Rolm Baptise M.D.   On: 11/27/2017 09:57   Mr Brain Wo Contrast  Result Date: 11/28/2017 CLINICAL DATA:  Follow-up examination for intracranial hemorrhage. EXAM: MRI HEAD WITHOUT CONTRAST TECHNIQUE: Multiplanar, multiecho pulse sequences of the brain and surrounding structures were obtained without intravenous contrast. COMPARISON:  Prior CT and CTA from earlier the same day. FINDINGS: Brain: Postoperative changes from recent left frontal craniotomy for evacuation of left basal ganglia hematoma again seen. Residual fluid and blood products seen within the resection cavity with probable superimposed scattered foci of pneumocephalus. Overall, size relatively stable from recent exams. Surrounding vasogenic edema and regional mass effect with attenuation of the adjacent left lateral  ventricle. Persistent 8-9 mm of left-to-right midline shift with left uncal herniation (series 15, image 16). Basilar cisterns remain patent. No hydrocephalus or ventricular trapping. Additional small right holo hemispheric subdural hematoma measures up to 3 mm in maximal thickness. Extension along the tentorium again noted. Underlying extensive cytotoxic edema involving the majority of the left MCA territory with involvement of the left frontal, parietal, and temporal occipital lobes, consistent with underlying evolving acute left MCA territory infarct. Associated gyral swelling and edema throughout the area of infarction. No other parenchymal hemorrhage outside the area of basal ganglia hemorrhage. No underlying mass lesion. Remainder of the brain is relatively normal in appearance. Pituitary gland normal. Vascular: Major intracranial vascular flow voids are maintained Skull and upper cervical spine: Craniocervical junction within normal limits. No transtentorial herniation. Upper cervical spine normal. No focal marrow replacing lesion. Sequelae of prior left frontal craniotomy. Skin staples remain in place. Sinuses/Orbits: Globes and orbital soft tissues within normal limits. Right sphenoid sinus disease noted. Paranasal sinuses are otherwise largely clear. Trace right mastoid effusion, of doubtful significance. Other: None. IMPRESSION: 1. Postoperative changes from previous left frontal craniotomy for evacuation of left basal ganglia hematoma. Residual blood products within the evacuation cavity with persistent surrounding vasogenic edema and regional mass effect. Associated 8-9 mm of right-to-left midline shift with mild left uncal herniation similar to previous. No hydrocephalus or ventricular trapping. 2. Underlying evolving large acute ischemic left MCA territory infarct involving the majority of the left MCA territory. 3. Trace right cerebral and tentorial subdural hematoma without mass effect. 4. Right  sphenoid sinusitis. Electronically Signed   By: Jeannine Boga M.D.   On: 11/28/2017 02:01    Physical Exam  HEENT-status post left frontoparietal craniotomy with wound clean dry, soft to touch, L cranial edema  Cardiovascular- S1-S2 audible, pulses palpable throughout   Lungs- breath sounds clear bilaterally, Status post tracheostomy with trach collar, nonlabored breathing, has some light red drainage Abdomen- bowel sounds present, RLQ wound CDI musculoskeletal-no joint tenderness, deformity or swelling Skin-warm and dry Neurological Exam:  Awake, alert, globally aphasic, does not follow commands.  Eyes open, left gaze preference improved, able to cross midline, PERRL, mild left ptosis. Not blinking to visual threat on the right, but able to trak objects on the right. Right facial droop. Tongue protrusion not cooperative. LUE 5/5 and able to against gravity. LLE withdraw to pain 4/5. RUE 2-/5 on pain stimuli and RLE 2/5 to pain. Increasing RUE tone. B/l babinski becoming muted. Sensation, coordination and gait not otherwise tested.   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 15 mm of left-to-right midline shift.  MRI of the brain: Post craniotomy 8-9 mm of right-to-left midline shift with mild left uncal herniation similar to previous. Underlying evolving large acute ischemic left MCA territory infarct involving the majority of the left MCA territory.  Repeat Head CT post evacuation 9/20 - Left MCA territory infarct with evacuated hematoma. Midline shift measures 8 mm.   UDS - positive  for amphetamines ( Pt takes Adderal at home)  2D Echocardiogram EF 60 to 65%  LE venous doppler negative for DVT  Hypercoagulable labs negative except high ESR and CRP  May consider TEE if she has more functional improvement   LDL 64  HgbA1c 5.8  VTE: Heparin subq   On ASA 81 for stroke prevention  Therapy recommendations: SNF  Disposition: Pending   Helmet ordered - for delivery next week  New bed in place to increase mobility  SLP working on [employer] trials  Acute respiratory failure in the setting of massive IPH  Status post tracheostomy on 12/09/17  Well on trach collar  Continue trach care per protocol  Intermittent chest x-rays  Chest x-ray done on 12/14/2017 shows no acute disease   secretions moderate - s/p trach change per staff at bedside.   Labs slightly altered - WBC 11, Hgb 8.2 - will recheck Monday  Trach tube downsized and replaced  Seizure  Episode of stiffness, tongue biting on 12/03/17  Overnight EEG no seizure  Increased keppra from 500 to 1019m bid  No more seizure since  Seizure precautions  Hypertension emergency  Stable now at 120-130s  BP elevated on admission  BP goal normotensive  On norvasc 10, lisinopril 20 bid and labetalol 300 tid  Off minoxidil and HCTZ    Anemia  Hb 11.8 on admission  Hemoglobin 7.3-->8.0->8.3->8.5->8.2 - recheck Monday  Received one unit PRBC during this admission    Continue to monitor  Iron study iron 36 and ferritin 66  Pt currently on  iron pills  PRBC transfusion if HGb < 7.0  Leukocytosis, mild increase  WBC 9.4-->9.5->10.4->11.0  No fever   cultures neg  Recheck Monday  Dysphagia, secondary to stroke  PEG tube placed on 12/09/2017 for tube feeding  Accu-Chek every 4 hours while on tube feeding  On free water  On  bolus tube feeds   Other Stroke Risk Factors  ETOH use   Other Active Problems  ADHD - on Adderell at  home  Anxiety  Depression  Hypokalemia, resolved - Bmet Monday  Hospital day # 24        To contact Stroke Continuity provider, please refer to [email or web address] After hours, contact General Neurology

## 2017-12-21 NOTE — Progress Notes (Signed)
Placed page to portable to obtain low bed for patient at risk for fall.

## 2017-12-22 DIAGNOSIS — I639 Cerebral infarction, unspecified: Secondary | ICD-10-CM

## 2017-12-22 DIAGNOSIS — R1310 Dysphagia, unspecified: Secondary | ICD-10-CM

## 2017-12-22 LAB — BASIC METABOLIC PANEL
Anion gap: 12 (ref 5–15)
BUN: 13 mg/dL (ref 6–20)
CHLORIDE: 102 mmol/L (ref 98–111)
CO2: 25 mmol/L (ref 22–32)
Calcium: 10 mg/dL (ref 8.9–10.3)
Creatinine, Ser: 0.81 mg/dL (ref 0.44–1.00)
GFR calc Af Amer: >60 mL/min (ref >60)
GFR calc non Af Amer: >60 mL/min (ref >60)
GLUCOSE: 136 mg/dL — AB (ref 70–99)
POTASSIUM: 3.6 mmol/L (ref 3.5–5.1)
Sodium: 139 mmol/L (ref 135–145)

## 2017-12-22 LAB — CBC
HCT: 31.9 % — ABNORMAL LOW (ref 36.0–46.0)
HEMOGLOBIN: 9.3 g/dL — AB (ref 12.0–15.0)
MCH: 24.2 pg — AB (ref 26.0–34.0)
MCHC: 29.2 g/dL — AB (ref 30.0–36.0)
MCV: 83.1 fL (ref 80.0–100.0)
Platelets: 385 10*3/uL (ref 150–400)
RBC: 3.84 MIL/uL — AB (ref 3.87–5.11)
RDW: 21.8 % — ABNORMAL HIGH (ref 11.5–15.5)
WBC: 9.6 10*3/uL (ref 4.0–10.5)
nRBC: 0 % (ref 0.0–0.2)

## 2017-12-22 LAB — GLUCOSE, CAPILLARY
GLUCOSE-CAPILLARY: 143 mg/dL — AB (ref 70–99)
GLUCOSE-CAPILLARY: 116 mg/dL — AB (ref 70–99)
GLUCOSE-CAPILLARY: 113 mg/dL — AB (ref 70–99)
Glucose-Capillary: 102 mg/dL — ABNORMAL HIGH (ref 70–99)
Glucose-Capillary: 103 mg/dL — ABNORMAL HIGH (ref 70–99)

## 2017-12-22 NOTE — Progress Notes (Signed)
Physical Therapy Treatment Patient Details Name: Tammie Miles MRN: [id number] DOB: November 11, 1975 Today's Date: 12/22/2017    History of Present Illness Patient is a 42 y/o female who was admitted for AMS/unresponsiveness. 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 Returned to OR 9/20 for decompressive crani with left skull harvested in abdomen. 9/25 developed seizures. Intubated 9/19-10/7. s/p trach and peg 10/1. PMH includes HTN, depression, anxiety.     PT Comments    Patient seen for activity progression. Tolerated PROM and some AAROM (left LE with visual and tactile cues). Patient following some simple commands inconsistently. Patient assisted OOB to chair today via life and tolerated well. Current POC remains appropriate.   Follow Up Recommendations  SNF     Equipment Recommendations  None recommended by PT    Recommendations for Other Services       Precautions / Restrictions Precautions Precautions: Fall;Other (comment) Precaution Comments: trach collar, PEG, no bone flap L skull Required Braces or Orthoses: Other Brace/Splint Other Brace/Splint: Bilateral PRAFO Boots Restrictions Weight Bearing Restrictions: No    Mobility  Bed Mobility Overal bed mobility: Needs Assistance Bed Mobility: Rolling Rolling: Total assist;+2 for physical assistance   Supine to sit: Total assist;+2 for physical assistance     General bed mobility comments: focus of session on positioning for lift OOB to chair  Transfers Overall transfer level: Needs assistance               General transfer comment: assisted OOB to chair with use of maximove lift  Ambulation/Gait                 Stairs             Wheelchair Mobility    Modified Rankin (Stroke Patients Only) Modified Rankin (Stroke Patients  Only) Pre-Morbid Rankin Score: No symptoms Modified Rankin: Severe disability     Balance Overall balance assessment: Needs assistance Sitting-balance support: Feet supported;Single extremity supported;No upper extremity supported Sitting balance-Leahy Scale: Zero Sitting balance - Comments: non purposeful push of L UE (during assisted sitting). Requires total A for sitting EOB with heavy right lean due to pushing. Attempted to have pt lean towards L with WB'ing through L forearm but pt with too strong of a push and desire to push with no understanding of commands Postural control: Right lateral lean                                  Cognition Arousal/Alertness: Awake/alert Behavior During Therapy: Flat affect Overall Cognitive Status: Difficult to assess Area of Impairment: Following commands                       Following Commands: Follows one step commands with increased time;Follows one step commands inconsistently              Exercises Other Exercises Other Exercises: Bilateral LE ROM activities performed, some noted AAROM left LE    General Comments        Pertinent Vitals/Pain Pain Assessment: Faces Pain Score: 0-No pain    Home Living                      Prior Function  PT Goals (current goals can now be found in the care plan section) Acute Rehab PT Goals Patient Stated Goal: unable to participate with goals PT Goal Formulation: Patient unable to participate in goal setting Time For Goal Achievement: 12/30/17 Potential to Achieve Goals: Fair Progress towards PT goals: Progressing toward goals(tolerated OOB positioning today)    Frequency    Min 3X/week      PT Plan Current plan remains appropriate    Co-evaluation              AM-PAC PT "6 Clicks" Daily Activity  Outcome Measure  Difficulty turning over in bed (including adjusting bedclothes, sheets and blankets)?: Unable Difficulty moving  from lying on back to sitting on the side of the bed? : Unable Difficulty sitting down on and standing up from a chair with arms (e.g., wheelchair, bedside commode, etc,.)?: Unable Help needed moving to and from a bed to chair (including a wheelchair)?: Total Help needed walking in hospital room?: Total Help needed climbing 3-5 steps with a railing? : Total 6 Click Score: 6    End of Session Equipment Utilized During Treatment: Oxygen;Other (comment)(trach collar 28%) Activity Tolerance: Patient tolerated treatment well Patient left: in chair;with call bell/phone within reach;with chair alarm set Nurse Communication: Mobility status;Need for lift equipment PT Visit Diagnosis: Hemiplegia and hemiparesis Hemiplegia - Right/Left: Right Hemiplegia - caused by: Nontraumatic intracerebral hemorrhage     Time: 1040-1102 PT Time Calculation (min) (ACUTE ONLY): 22 min  Charges:  $Therapeutic Activity: 8-22 mins                     Charlotte Crumb, PT DPT  Board Certified Neurologic Specialist Acute Rehabilitation Services Pager [phone] Office [phone]    Fabio Asa 12/22/2017, 1:45 PM

## 2017-12-22 NOTE — Progress Notes (Signed)
SLP Cancellation Note  Patient Details Name: AMBERLIN UTKE MRN: [id number] DOB: 09-Dec-1975   Cancelled treatment:       Reason Eval/Treat Not Completed: Patient at procedure or test/unavailable - preparing to be bathed.. Will continue efforts   Carolan Shiver 12/22/2017, 3:39 PM

## 2017-12-22 NOTE — Progress Notes (Signed)
NAME:  Tammie Miles, MRN:  [id number], DOB:  [birth year], LOS: 25 ADMISSION DATE:  11/27/2017, CONSULTATION DATE:  11/27/2017 REFERRING MD:  Dr. Conchita Paris CHIEF COMPLAINT:  Left frontal hemorrhage  Brief History   57 yoF admitted 9/19 with AMS that began the evening of 9/18, initially thought due to intoxication. Remained unresponsive through am of 9/19.  Patient was brought to the ED where a CT was done and showed a large left sided ICH. Patient was taken to the OR and a mini craniectomy was done.  Patient was brought out to the ICU and PCCM was consulted for vent management. Course complicated by cerebral edema requiring 3% saline limited by hypernatremia. Also complicated by encephalopathy. Tolerating vent wean, but mental status limited extubation. Then 9/25 she developed what appeared to be seizures with gaze deviation and tongue biting. EEG done overnight with results pending and AEDs adjusted. No further seizure seen.   Significant Hospital Events   9/19 Admit/ OR- mini crani 9/20 Decompressive craniectomy ( L)  with skull harvesting in the abdomen 9/21 hypertonic saline started 9/23 hypertonic stopped.  9/25 Seizure. EEG done with no sz activity. AEDs adjusted. 10/1 Trach / PEG 10/2 Trach collar    Consults: date of consult/date signed off & final recs  9/19 NSGY 9/19 PCCM   Procedures (surgical and bedside):  9/19 Intubated 9/19 OR for crani, evac of hematoma>> no change in neuro status post op 9/20 Decompressive craniectomy ( L)  with skull harvesting in the abdomen  Significant Diagnostic Tests:  9/19 [facility]  Large area of hemorrhage in the left basal ganglia and left frontal lobe, 5.8 x 3.5 cm. 15 mm of left-to-right midline shift. 9/19 CTA head: Positive for left MCA M1 large vessel occlusion, and also severe stenosis at the left ICA terminus, superimposed on the relatively large acute left hemisphere intra-axial hemorrhage (estimated blood volume 43 mL). Vasospasm suspected in  the proximal ACAs. 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 2. Negative for intracranial aneurysm, CTA spot sign, or evidence of vascular malformation in association with the acute hemorrhage. 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 3. Stable intracranial mass effect since 0938 hours today, with 16 mm of leftward midline shift and trapping of the right lateral ventricle. 11/29/2017 CT Head Large left MCA infarct is stable in distribution. Interval left frontal craniectomy with mild herniation of left frontal lobe through the defect. Resolved uncal herniation, improved patency of left lateral ventricle, and 6 mm left-to-right midline shift, previously 8 mm. Partial interval dispersion of acute hemorrhage in the left basal ganglia. Subcentimeter density anterior to the prior position of blood products possibly representing retracted clot or interval hemorrhage, attention at follow-up recommended. CT 9/24: 1. Left MCA distribution infarction is stable in distribution. Mild increased edema of the infarct with increased herniation of the left frontal lobe via the left frontal craniectomy. 2. Stable 6 mm left-to-right midline shift and partial effacement of left lateral ventricle. 3. Stable small hematoma within the left basal ganglia. 4. No new acute intracranial abnormality identified.  Micro Data: 9/19 MRSA PCR >> Negative 9/19 Sputum Culture >> normal flora 9/22 Blood x 2 >> negative 9/22 UA >> Normal 9/22 Sputum >> few candida dubliniensis 12/13/2017 blood cultures x2>> 12/13/2017 urine culture>> 12/13/2017 sputum culture>>  Antimicrobials:  9/19 Cefazolin (pre-op) 9/20  Subjective:  No events overnight,  no new complaints  Objective   Blood pressure 121/85, pulse 91, temperature 98.5 F (36.9 C),  temperature source Oral, resp. rate 18, height 5\' 1"  (1.549 m), weight 68.2 kg, SpO2 94 %.    FiO2 (%):  [28 %] 28 %  No intake or output data in the 24 hours ending 12/22/17 1416 Filed Weights   12/17/17 0413 12/18/17 0335 12/20/17 1955  Weight: 75.1 kg 69.7 kg 68.2 kg   No intake or output data in the 24 hours ending 12/22/17 1416 Examination:  General: Acutely ill appearing female, NAD HEENT: Trach in place, on TC, post op crani Neuro: Following some commands CV: RRR, Nl S1/S2 and -M/R/G PULM: Transmitted upper airway sounds GI: Soft, NT, ND and +BS Extremities: -edema and -tenderness Skin: Intact  I reviewed CXR myself, no acute disease noted, trach is in place  Resolved Hospital Problem list     Assessment & Plan:    Acute respiratory insufficiency in the setting of massive IPH - s/p trach placement 10/1. - Maintain on TC as tolerated - Monitor for trach bleeding - Change trach to a cuffless trach size 6 to assist with PMV trials - Continue speech pathology efforts  Hypoxemia - Titrate O2 for sat of 88-92%0  Dysphagia: - Continue SLP efforts  Discussed with PCCM-NP.  Disposition / Summary of Today's Plan 12/22/17   Change to a cuffless 6 trach    Diet: TF  Pain/Anxiety/Delirium protocol: None DVT prophylaxis: SCD's GI prophylaxis: pepcid  Hyperglycemia protocol: None Mobility: BR Code Status: full Family Communication: 9/30 spoke with son, reviewed patients overall state of health.  He is very articulate in his understanding of her illness.   He requests that we continue full support / proceed with trach as he wishes to give his little sister as much time with her mother as possible (she is 7).   10/7 no family available.  12/19/2017 no family at bedside  Labs   CBC: Recent Labs  Lab 12/16/17 0403 12/17/17 1610 12/18/17 0426 12/19/17 0352 12/22/17 1220  WBC 10.1 10.4 10.8* 11.0* 9.6  HGB 8.3* 8.5* 8.5* 8.2* 9.3*  HCT 26.8* 28.0* 28.2* 28.0* 31.9*   MCV 81.0 80.2 81.5 81.9 83.1  PLT 454* 485* 488* 473* 385   Basic Metabolic Panel: Recent Labs  Lab 12/16/17 0403 12/17/17 0644 12/18/17 0426 12/22/17 1220  NA 138 136 139 139  K 3.8 3.9 3.9 3.6  CL 102 100 103 102  CO2 27 26 25 25   GLUCOSE 107* 120* 113* 136*  BUN 16 14 17 13   CREATININE 0.75 0.81 0.75 0.81  CALCIUM 8.9 9.5 9.4 10.0   GFR: Estimated Creatinine Clearance: 80 mL/min (by C-G formula based on SCr of 0.81 mg/dL). Recent Labs  Lab 12/17/17 0644 12/18/17 0426 12/19/17 0352 12/22/17 1220  WBC 10.4 10.8* 11.0* 9.6   Liver Function Tests: No results for input(s): AST, ALT, ALKPHOS, BILITOT, PROT, ALBUMIN in the last 168 hours. No results for input(s): LIPASE, AMYLASE in the last 168 hours. No results for input(s): AMMONIA in the last 168 hours. ABG    Component Value Date/Time   PHART 7.444 12/08/2017 0414   PCO2ART 36.7 12/08/2017 0414   PO2ART 109 (H) 12/08/2017 0414   HCO3 24.6 12/08/2017 0414   TCO2 27 12/05/2017 1216   ACIDBASEDEF 6.0 (H) 11/29/2017 0310   O2SAT 98.2 12/08/2017 0414    Coagulation Profile: No results for input(s): INR, PROTIME in the last 168 hours. Cardiac Enzymes: No results for  input(s): CKTOTAL, CKMB, CKMBINDEX, TROPONINI in the last 168 hours. HbA1C: Hgb A1c MFr Bld  Date/Time Value Ref Range Status  11/28/2017 08:32 AM 5.8 (H) 4.8 - 5.6 % Final    Comment:    (NOTE) Pre diabetes:          5.7%-6.4% Diabetes:              >6.4% Glycemic control for   <7.0% adults with diabetes    CBG: Recent Labs  Lab 12/21/17 1935 12/21/17 2350 12/22/17 0320 12/22/17 0800 12/22/17 1228  GLUCAP 93 129* 102* 103* 143*   Alyson Reedy, M.D. [facility] Pulmonary/Critical Care Medicine. Pager: [phone]. After hours pager: [phone].

## 2017-12-22 NOTE — Progress Notes (Signed)
NEUROHOSPITALISTS STROKE TEAM - DAILY PROGRESS NOTE   SUBJECTIVE Pt `s therapists are   at bedside. No acute event overnight. Still has global aphasia and right hemiparesis. Down sized trach few days ago. On trach collar and plan to change to cuffless size 6 trach tube now.   OBJECTIVE Most recent Vital Signs: Vitals:   12/22/17 0748 12/22/17 0843 12/22/17 1145 12/22/17 1225  BP: (!) 147/90   121/85  Pulse: 85 83  91  Resp: _0 Temp: 98.4 F (36.9 C)   98.5 F (36.9 C)  TempSrc: Oral   Oral  SpO2: 99% 98% 99% 94%  Weight:      Height:       CBG (last 3)  Recent Labs    12/22/17 0320 12/22/17 0800 12/22/17 1228  GLUCAP 102* 103* 143*   CBC Latest Ref Rng & Units 12/22/2017 12/19/2017 12/18/2017  WBC 4.0 - 10.5 K/uL 9.6 11.0(H) 10.8(H)  Hemoglobin 12.0 - 15.0 g/dL 9.3(L) 8.2(L) 8.5(L)  Hematocrit 36.0 - 46.0 % 31.9(L) 28.0(L) 28.2(L)  Platelets 150 - 400 K/uL 385 473(H) 488(H)    BMP Latest Ref Rng & Units 12/22/2017 12/18/2017 12/17/2017  Glucose 70 - 99 mg/dL 136(H) 113(H) 120(H)  BUN 6 - 20 mg/dL _1 Creatinine 0.44 - 1.00 mg/dL 0.81 0.75 0.81  Sodium 135 - 145 mmol/L 139 139 136  Potassium 3.5 - 5.1 mmol/L 3.6 3.9 3.9  Chloride 98 - 111 mmol/L 102 103 100  CO2 22 - 32 mmol/L _2 Calcium 8.9 - 10.3 mg/dL 10.0 9.4 9.5    IMAGING Ct Angio Head W Or Wo Contrast  Addendum Date: 11/27/2017   ADDENDUM REPORT: 11/27/2017 11:17 ADDENDUM: Study discussed by telephone with Neurosurgeon Dr. Consuella Lose on 11/27/2017 at 1108 hours. Electronically Signed   By: Genevie Ann M.D.   On: 11/27/2017 11:17   Result Date: 11/27/2017 CLINICAL DATA:  42 year old female found unresponsive with relatively large intra-axial hemorrhage in the left hemisphere on noncontrast head CT. EXAM: CT ANGIOGRAPHY HEAD TECHNIQUE: Multidetector CT imaging of the head was performed using the standard protocol during bolus administration of  intravenous contrast. Multiplanar CT image reconstructions and MIPs were obtained to evaluate the vascular anatomy. CONTRAST:  35m ISOVUE-370 IOPAMIDOL (ISOVUE-370) INJECTION 76% COMPARISON:  Head CT without contrast 0938 hours today. FINDINGS: Posterior circulation: Mildly dominant distal left vertebral artery. Patent vertebral arteries to the vertebrobasilar junction without stenosis. Patent left PICA and dominant appearing right AICA origins. Patent basilar artery without stenosis. Mild tortuosity at the basilar tip. SCA and PCA origins are within normal limits. Posterior communicating arteries are diminutive or absent. Bilateral PCA branches are within normal limits. 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 Smooth tapering of the vessel. No right ICA siphon atherosclerosis or stenosis identified. Patent right ICA terminus. Normal right ophthalmic artery origin. Similar but lesser caliber variation in the distal left ICA which measures up to 6 millimeters diameter (coronal series 7, images 88 and 89. No left ICA siphon atherosclerosis, but there is moderate to severe vessel wall irregularity and stenosis in the supraclinoid segment (series 9, image 92 best demonstrated series 9, images 92-95. Despite this the left ICA terminus is patent. Both ACA A1 segments are diminutive but patent. There is mass effect on the left scratched at there is mass effect on the bilateral ACA branches  which appear patent and within normal limits. The right MCA M1 segment, right MCA trifurcation, and right MCA branches are within normal limits. The left MCA M1 segment is occluded about 6 millimeters beyond its origin (series 8, image 16) with poor reconstituted enhancement in the left MCA branches. No CTA spot sign is identified. No abnormal increased vascularity in the area of left hemisphere  hematoma. Venous sinuses: Early contrast timing, but grossly patent superior sagittal sinus. Anatomic variants: Dominant distal left vertebral artery. Other findings: Estimated left hemisphere intra-axial hemorrhage volume 43 milliliters (60 x 33 x 43 millimeters AP by transverse by CC). No intraventricular or extra-axial extension of blood is evident. Intracranial mass effect appears stable with rightward midline shift up to 16 millimeters. Substantial mass effect on the lateral ventricles redemonstrated. The right lateral ventricle appears trapped. Review of the MIP images confirms the above findings IMPRESSION: 1. Positive for left MCA M1 large vessel occlusion, and also severe stenosis at the left ICA terminus, superimposed on the relatively large acute left hemisphere intra-axial hemorrhage (estimated blood volume 43 mL). Vasospasm suspected in the proximal ACAs. 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 2. Negative for intracranial aneurysm, CTA spot sign, or evidence of vascular malformation in association with the acute hemorrhage. 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 3. Stable intracranial mass effect since 0938 hours today, with 16 mm of leftward midline shift and trapping of the right lateral ventricle. Study briefly discussed by telephone with Dr. Venora Maples in the ED on 11/27/2017 at 10:55. He advises the patient has now been taken to the neuro operating room, and I am now attempting to contact the Neurosurgeon regarding these findings. Electronically Signed: By: Genevie Ann M.D. On: 11/27/2017 10:58   Ct Head Wo Contrast  Result Date: 12/02/2017 CLINICAL DATA:  42 y/o  F; intracranial hemorrhage for follow-up. EXAM: CT HEAD WITHOUT CONTRAST TECHNIQUE: Contiguous axial images were obtained from the base of the  skull through the vertex without intravenous contrast. COMPARISON:  11/29/2017 CT head. FINDINGS: Brain: Stable distribution of left MCA infarction. Mild interval increase in edema a mildly increased herniation of the left frontal lobe the craniectomy. Stable 6 mm left-to-right midline shift and partial effacement of the left lateral ventricle. Stable hematoma within the left basal ganglia post partial evacuation. No new acute intracranial hemorrhage, stroke, or focal mass effect. Vascular: No hyperdense vessel or unexpected calcification. Skull: Left frontal craniectomy postsurgical changes. Decreased edema and interval resolution of soft tissue air and pneumocephalus. Sinuses/Orbits: Aerosolized secretions in the right sphenoid sinus. Additional visualized paranasal sinuses and the mastoid air cells are normally aerated. Other: None. IMPRESSION: 1. Left MCA distribution infarction is stable in distribution. Mild increased edema of the infarct with increased herniation of the left frontal lobe via the left frontal craniectomy. 2. Stable 6 mm left-to-right midline shift and partial effacement of left lateral ventricle. 3. Stable small hematoma within the left basal ganglia. 4. No new acute intracranial abnormality identified. Electronically Signed   By: Kristine Garbe M.D.   On: 12/02/2017 03:08   Ct Head Wo Contrast  Result Date: 11/29/2017 CLINICAL DATA:  42 y/o  F; stroke for follow-up. EXAM: CT HEAD WITHOUT CONTRAST TECHNIQUE: Contiguous axial images were obtained from the base of the skull through the vertex without intravenous contrast. COMPARISON:  11/27/2017 MRI head.  11/28/2017 CT head.  FINDINGS: Brain: Large left MCA infarction is stable in distribution. Interval left frontal craniectomy with partial herniation of the left frontal lobe through the craniectomy defect. Interval resolution of uncal herniation, improved patency of the left lateral ventricle, and 6 mm left-to-right midline shift,  previously 8 mm. Partial interval dispersion of acute hemorrhage in the left basal ganglia post partial evacuation. There is a subcentimeter density anterior to the prior position of blood products possibly representing retracted clot or interval hemorrhage (series 3, image 19), attention at follow-up recommended. Vascular: No hyperdense vessel or unexpected calcification. Skull: Interval left frontal craniectomy with edema and several foci of air in the overlying scalp as well as skin staples. Sinuses/Orbits: Possible right sphenoid sinus opacification with aerosolized secretions. Normal aeration of the mastoid air cells. Other: None. IMPRESSION: 1. Large left MCA infarct is stable in distribution. 2. Interval left frontal craniectomy with mild herniation of left frontal lobe through the defect. 3. Resolved uncal herniation, improved patency of left lateral ventricle, and 6 mm left-to-right midline shift, previously 8 mm. 4. Partial interval dispersion of acute hemorrhage in the left basal ganglia. Subcentimeter density anterior to the prior position of blood products possibly representing retracted clot or interval hemorrhage, attention at follow-up recommended. Electronically Signed   By: Kristine Garbe M.D.   On: 11/29/2017 06:00   Ct Head Wo Contrast  Result Date: 11/28/2017 CLINICAL DATA:  Stroke follow-up EXAM: CT HEAD WITHOUT CONTRAST TECHNIQUE: Contiguous axial images were obtained from the base of the skull through the vertex without intravenous contrast. COMPARISON:  Yesterday FINDINGS: Brain: Acute infarct in the entirety of the left MCA territory. There was superimposed hematoma requiring craniotomy. Residual blood products are stable at 18 mm on axial slices. There is a very thin subdural hematoma along the right cerebral convexity, stable at 2 to 3 mm. Cytotoxic edema continues to cause 8 mm of midline shift at the septum pellucidum. No ventriculomegaly. No evidence of new infarct.  Vascular: Negative Skull: Unremarkable left frontal craniotomy. Sinuses/Orbits: Small volume secretions in the right sphenoid sinus IMPRESSION: 1. Stable when compared to yesterday. 2. Left MCA territory infarct with evacuated hematoma. Midline shift measures 8 mm. 3. 2-3 mm subdural hematoma along the right cerebral convexity without interval increase. Electronically Signed   By: Monte Fantasia M.D.   On: 11/28/2017 09:20   Ct Head Wo Contrast  Result Date: 11/27/2017 CLINICAL DATA:  Follow up intracranial hemorrhage. Status post LEFT frontotemporal craniotomy. EXAM: CT HEAD WITHOUT CONTRAST TECHNIQUE: Contiguous axial images were obtained from the base of the skull through the vertex without intravenous contrast. COMPARISON:  CT HEAD November 27, 2017 FINDINGS: BRAIN: Interval evacuation LEFT basal ganglia hematoma was small amount of residual blood products with extending along the surgical approach. Decreased surrounding edema. Small amount of LEFT frontal intraparenchymal and extra-axial pneumocephalus. 7 mm LEFT-to-RIGHT midline shift, decreased from 15 mm. Re-expanded LEFT lateral ventricle. Resolved RIGHT ventricular entrapment. No hydrocephalus. No acute large vascular territory infarcts. Trace RIGHT holo hemispheric and tentorial subdural hematoma. Global edema with effaced basal cisterns. VASCULAR: Unremarkable. SKULL/SOFT TISSUES: Status post LEFT frontal craniotomy. LEFT frontal hemangioma. LEFT scalp soft tissue swelling with subcutaneous gas and overlying skin staples. No significant soft tissue swelling. ORBITS/SINUSES: The included ocular globes and orbital contents are normal.Lobulated RIGHT sphenoid sinus mucosal thickening with small air-fluid level. Mastoid air cells are well aerated. OTHER: None. IMPRESSION: 1. Interval LEFT frontal craniotomy for basal ganglia hematoma evacuation, small amount of residual blood products and regional edema.  7 mm residual LEFT to RIGHT midline shift,  resolved RIGHT ventricular entrapment. 2. Trace RIGHT holo hemispheric and tentorial subdural hematomas. Global edema. Electronically Signed   By: Elon Alas M.D.   On: 11/27/2017 17:02   Ct Head Wo Contrast  Result Date: 11/27/2017 CLINICAL DATA:  Altered mental status EXAM: CT HEAD WITHOUT CONTRAST TECHNIQUE: Contiguous axial images were obtained from the base of the skull through the vertex without intravenous contrast. COMPARISON:  05/22/2009 FINDINGS: Brain: Large area of hemorrhage in the region of the left basal ganglia and extending into the left frontal lobe measuring approximately 5.8 x 3.5 cm. There is 15 mm of left-to-right midline shift. No hydrocephalus. Vascular: No hyperdense vessel or unexpected calcification. Skull: No acute calvarial abnormality. Sinuses/Orbits: Visualized paranasal sinuses and mastoids clear. Orbital soft tissues unremarkable. Other: None IMPRESSION: Large area of hemorrhage in the left basal ganglia and left frontal lobe, 5.8 x 3.5 cm. 15 mm of left-to-right midline shift. Critical Value/emergent results were called by telephone at the time of interpretation on 11/27/2017 at 9:56 am to Dr. Jola Schmidt , who verbally acknowledged these results. Electronically Signed   By: Rolm Baptise M.D.   On: 11/27/2017 09:57   Mr Brain Wo Contrast  Result Date: 11/28/2017 CLINICAL DATA:  Follow-up examination for intracranial hemorrhage. EXAM: MRI HEAD WITHOUT CONTRAST TECHNIQUE: Multiplanar, multiecho pulse sequences of the brain and surrounding structures were obtained without intravenous contrast. COMPARISON:  Prior CT and CTA from earlier the same day. FINDINGS: Brain: Postoperative changes from recent left frontal craniotomy for evacuation of left basal ganglia hematoma again seen. Residual fluid and blood products seen within the resection cavity with probable superimposed scattered foci of pneumocephalus. Overall, size relatively stable from recent exams. Surrounding  vasogenic edema and regional mass effect with attenuation of the adjacent left lateral ventricle. Persistent 8-9 mm of left-to-right midline shift with left uncal herniation (series 15, image 16). Basilar cisterns remain patent. No hydrocephalus or ventricular trapping. Additional small right holo hemispheric subdural hematoma measures up to 3 mm in maximal thickness. Extension along the tentorium again noted. Underlying extensive cytotoxic edema involving the majority of the left MCA territory with involvement of the left frontal, parietal, and temporal occipital lobes, consistent with underlying evolving acute left MCA territory infarct. Associated gyral swelling and edema throughout the area of infarction. No other parenchymal hemorrhage outside the area of basal ganglia hemorrhage. No underlying mass lesion. Remainder of the brain is relatively normal in appearance. Pituitary gland normal. Vascular: Major intracranial vascular flow voids are maintained Skull and upper cervical spine: Craniocervical junction within normal limits. No transtentorial herniation. Upper cervical spine normal. No focal marrow replacing lesion. Sequelae of prior left frontal craniotomy. Skin staples remain in place. Sinuses/Orbits: Globes and orbital soft tissues within normal limits. Right sphenoid sinus disease noted. Paranasal sinuses are otherwise largely clear. Trace right mastoid effusion, of doubtful significance. Other: None. IMPRESSION: 1. Postoperative changes from previous left frontal craniotomy for evacuation of left basal ganglia hematoma. Residual blood products within the evacuation cavity with persistent surrounding vasogenic edema and regional mass effect. Associated 8-9 mm of right-to-left midline shift with mild left uncal herniation similar to previous. No hydrocephalus or ventricular trapping. 2. Underlying evolving large acute ischemic left MCA territory infarct involving the majority of the left MCA territory. 3.  Trace right cerebral and tentorial subdural hematoma without mass effect. 4. Right sphenoid sinusitis. Electronically Signed   By: Jeannine Boga M.D.   On: 11/28/2017 02:01  Physical Exam  HEENT-status post left frontoparietal craniotomy with wound clean dry, soft to touch, L cranial edema Cardiovascular- S1-S2 audible, pulses palpable throughout   Lungs- breath sounds clear bilaterally, Status post tracheostomy with trach collar, nonlabored breathing, has some light red drainage Abdomen- bowel sounds present, RLQ wound CDI musculoskeletal-no joint tenderness, deformity or swelling Skin-warm and dry Neurological Exam:  Awake, alert, globally aphasic, does not follow commands.  Eyes open, left gaze preference improved, able to cross midline, PERRL, mild left ptosis. Not blinking to visual threat on the right, but able to trak objects on the right. Right facial droop. Tongue protrusion not cooperative. LUE 5/5 and able to against gravity. LLE withdraw to pain 4/5. RUE 2-/5 on pain stimuli and RLE 2/5 to pain. Increasing RUE tone. B/l babinski becoming muted. Sensation, coordination and gait not otherwise tested.   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 15 mm of left-to-right midline shift.  MRI of the brain: Post craniotomy 8-9 mm of right-to-left midline shift with mild left uncal herniation similar to previous. Underlying evolving large acute ischemic left MCA territory infarct involving the majority of the left MCA territory.  Repeat Head CT post evacuation 9/20 - Left MCA  territory infarct with evacuated hematoma. Midline shift measures 8 mm.   UDS - positive for amphetamines ( Pt takes Adderal at home)  2D Echocardiogram EF 60 to 65%  LE venous doppler negative for DVT  Hypercoagulable labs negative except high ESR and CRP  LDL 64  HgbA1c 5.8  VTE: Heparin subq   On ASA 81 for stroke prevention  Therapy recommendations: SNF  Disposition: Pending   Helmet ordered - for delivery next week  New bed in place to increase mobility  SLP working on [employer] trials  Acute respiratory failure in the setting of massive IPH  Status post tracheostomy on 12/09/17  Well on trach collar  Continue trach care per protocol  Intermittent chest x-rays  Chest x-ray done on 12/14/2017 shows no acute disease   secretions moderate - s/p trach change per staff at bedside.   Labs slightly altered - WBC 11, Hgb 8.2 - will   Trach tube downsized and replaced  Seizure  Episode of stiffness, tongue biting on 12/03/17  Overnight EEG no seizure  Increased keppra from 500 to 1088m bid  No more seizure since  Seizure precautions  Hypertension emergency  Stable now at 120-130s  BP elevated on admission  BP goal normotensive  On norvasc 10, lisinopril 20 bid and labetalol 300 tid  Off minoxidil and HCTZ    Anemia  Hb 11.8 on admission  Hemoglobin 7.3-->8.0->8.3->8.5->8.2 -   Received one unit PRBC during this admission    Continue to monitor  Iron study iron 36 and ferritin 66  Pt currently on  iron pills  PRBC transfusion if HGb < 7.0  Leukocytosis, mild increase  WBC 9.4-->9.5->10.4->11.0  No fever   cultures neg  Recheck Monday  Dysphagia, secondary to stroke  PEG tube placed on 12/09/2017 for tube feeding  Accu-Chek every 4 hours while on tube feeding  On free water  On bolus tube feeds   Other Stroke Risk Factors  ETOH use   Other Active Problems  ADHD - on Adderell at  home  Anxiety  Depression  Hypokalemia, resolved -   Hospital day # 25  I have personally examined this patient, reviewed notes, independently viewed imaging studies, participated in medical decision making and plan of care.ROS completed by me personally and pertinent positives fully documented  I have made any additions or clarifications directly to the above note. Await SNF bed when available, medically stable for transfer but difficult placement. I had a long discussion with the case manager about patient's disposition and answered questions about her care. Greater than 50% and during this 25 minute visit was spent on coordination of care and discussion with care team  Antony Contras, [facility] Pager: [phone] 12/22/2017 2:38 PM       To contact Stroke Continuity provider, please refer to [email or web address] After hours, contact General Neurology

## 2017-12-23 LAB — GLUCOSE, CAPILLARY
GLUCOSE-CAPILLARY: 101 mg/dL — AB (ref 70–99)
GLUCOSE-CAPILLARY: 117 mg/dL — AB (ref 70–99)
Glucose-Capillary: 83 mg/dL (ref 70–99)
Glucose-Capillary: 170 mg/dL — ABNORMAL HIGH (ref 70–99)

## 2017-12-23 MED ORDER — LABETALOL HCL 200 MG PO TABS
200.0000 mg | ORAL_TABLET | Freq: Three times a day (TID) | ORAL | Status: DC
  Administered 2017-12-24: 200 mg via ORAL
  Filled 2017-12-23: qty 1

## 2017-12-23 NOTE — Progress Notes (Signed)
Occupational Therapy Treatment Patient Details Name: Tammie Miles MRN: [id number] DOB: Jan 04, 1976 Today's Date: 12/23/2017    History of present illness Patient is a 42 y/o female who was admitted for AMS/unresponsiveness. 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 Returned to OR 9/20 for decompressive crani with left skull harvested in abdomen. 9/25 developed seizures. Intubated 9/19-10/7. s/p trach and peg 10/1. PMH includes HTN, depression, anxiety.    OT comments  Patient progressing slowly.  Mod assist +2 for bed mobility to EOB, maintaining static sitting balance with min to mod assist given R lateral lean but not pushing today.  Able to follow simple 1 step commands to engage in routine ADL grooming tasks with max assist to wash face, swab mouth, and apply lotion. Patient able to scan towards R side, turning head and eyes together.  Noted increased irritation at trach, red phlegm and coughing throughout session; RN notified. Will continue to follow and update goals as appropriate next session.    Follow Up Recommendations  SNF;Supervision/Assistance - 24 hour    Equipment Recommendations  Hospital bed;Wheelchair (measurements OT);Wheelchair cushion (measurements OT)    Recommendations for Other Services      Precautions / Restrictions Precautions Precautions: Fall;Other (comment) Precaution Comments: trach collar, PEG, no bone flap L skull Restrictions Weight Bearing Restrictions: No       Mobility Bed Mobility Overal bed mobility: Needs Assistance Bed Mobility: Supine to Sit;Sit to Supine     Supine to sit: Mod assist;+2 for safety/equipment;+2 for physical assistance Sit to supine: Total assist;+2 for safety/equipment;+2 for physical assistance   General bed mobility comments: supine to EOB with modA +2, pt  initating L LE/UE and trunk movement towards EOB; total assist to return to supine    Transfers                 General transfer comment: deferred this session     Balance Overall balance assessment: Needs assistance Sitting-balance support: Feet supported;Single extremity supported;No upper extremity supported Sitting balance-Leahy Scale: Poor Sitting balance - Comments: min assist to mod assist EOB while engaging in ADLs with L UE  Postural control: Right lateral lean                                 ADL either performed or assessed with clinical judgement   ADL Overall ADL's : Needs assistance/impaired     Grooming: Maximal assistance;Sitting Grooming Details (indicate cue type and reason): sitting EOB +2 for safety, pt able to partially wash face and attempt to swab mouth, applied lotion to hands and legs using L UE              Lower Body Dressing: Total assistance;Bed level Lower Body Dressing Details (indicate cue type and reason): able to raise L LE and follow commands for therapist to don sock               General ADL Comments: pt restless upon entry, able to follow more 1 step commands but fatigues easily     Vision   Vision Assessment?: Vision impaired- to be further tested in functional context Additional Comments: pt following commands to visually track towards L and R, head turn towards R    Perception     Praxis  Cognition Arousal/Alertness: Awake/alert Behavior During Therapy: Flat affect;Restless Overall Cognitive Status: Difficult to assess Area of Impairment: Following commands                       Following Commands: Follows one step commands with increased time;Follows one step commands inconsistently       General Comments: pt able to follow more 1 step commands and appropriately using L UE/LE during session         Exercises     Shoulder Instructions       General Comments pt coughing  throughout session with red colored plem exiting trach, RN notified     Pertinent Vitals/ Pain       Pain Assessment: Faces Faces Pain Scale: No hurt  Home Living                                          Prior Functioning/Environment              Frequency  Min 2X/week        Progress Toward Goals  OT Goals(current goals can now be found in the care plan section)  Progress towards OT goals: Progressing toward goals  Acute Rehab OT Goals Patient Stated Goal: unable to participate with goals OT Goal Formulation: Patient unable to participate in goal setting Time For Goal Achievement: 12/30/17 Potential to Achieve Goals: Fair  Plan Discharge plan remains appropriate    Co-evaluation                 AM-PAC PT "6 Clicks" Daily Activity     Outcome Measure   Help from another person eating meals?: Total Help from another person taking care of personal grooming?: Total Help from another person toileting, which includes using toliet, bedpan, or urinal?: Total Help from another person bathing (including washing, rinsing, drying)?: Total Help from another person to put on and taking off regular upper body clothing?: Total Help from another person to put on and taking off regular lower body clothing?: Total 6 Click Score: 6    End of Session Equipment Utilized During Treatment: Oxygen  OT Visit Diagnosis: Other abnormalities of gait and mobility (R26.89);Muscle weakness (generalized) (M62.81);Low vision, both eyes (H54.2);Other symptoms and signs involving cognitive function;Hemiplegia and hemiparesis Hemiplegia - Right/Left: Right Hemiplegia - caused by: Nontraumatic intracerebral hemorrhage   Activity Tolerance Patient limited by lethargy   Patient Left in bed;with bed alarm set;with call bell/phone within reach   Nurse Communication Mobility status;Other (comment)(pt needs)        Time: 1338-1400 OT Time Calculation (min): 22  min  Charges: OT General Charges $OT Visit: 1 Visit OT Treatments $Self Care/Home Management : 8-22 mins  Chancy Milroy, OT Acute Rehabilitation Services Pager [phone] Office [phone]    Chancy Milroy 12/23/2017, 3:04 PM

## 2017-12-23 NOTE — Progress Notes (Signed)
  Speech Language Pathology Treatment: Dysphagia;Passy Muir Speaking valve  Patient Details Name: Tammie Miles MRN: [id number] DOB: 1975-12-08 Today's Date: 12/23/2017 Time: 1014-1030 SLP Time Calculation (min) (ACUTE ONLY): 16 min  Assessment / Plan / Recommendation Clinical Impression  Pt was seen for skilled ST targeting readiness for PMV and POs in addition to assessing progress in functional communication and dysphagia.   With trials of finger occlusion, pt remains largely unchanged from previous therapy session.  Pt's vitals remained WFL; however, she was unable to redirect air through the upper airway during the majority of attempts at occlusion.  Therapist was able to feel only a very slight amount of air coming from the nares during 1 out of 5 trials, otherwise pt made snoring, gasping sounds during occlusion and no air was felt or seen to be coming from nares or oral cavity.  Pt did accept 3 trials of ice chips with significantly delayed swallow response in 2 out of 3 opportunities.  No overt s/s of aspiration.  Pt did respond to her name and would turn her head to look at therapist but she was otherwise unable to follow commands or interact with her environment in any meaningful or purposeful way.  She made no attempts to communicate with therapist or phonate when trach was occluded.   Continue per current plan of care.    HPI HPI: 45 yoF with PMH significant for HTN, anxiety, ADHD, admitted 9/19 with large L hemorrhagic basal ganglia with 15 mm shift with early herniation s/p L frontotemporal crani 9/19 for evacuation of hematoma. ETT 9/19; 9/20 returned to OR for decompressive craniectomy ; 10/1 trach/PEG; transitioned to TC and off vent 10/5. CT 9/24: 1. Left MCA distribution infarction is stable in distribution. Mild increased edema of the infarct with increased herniation of the left frontal lobe via the left frontal craniectomy. 2. Stable 6 mm left-to-right midline shift and partial  effacement of left lateral ventricle. 3. Stable small hematoma within the left basal ganglia. CXR 10/9: no acute disease process. Pt trach changed yesterday to Shiley 6 cuffless      SLP Plan  Continue with current plan of care       Recommendations  Diet recommendations: NPO Medication Administration: Via alternative means      Patient may use Passy-Muir Speech Valve: with SLP only MD: Please consider changing trach tube to : Smaller size         Oral Care Recommendations: Oral care QID Follow up Recommendations: Skilled Nursing facility SLP Visit Diagnosis: Dysphagia, oropharyngeal phase (R13.12);Aphasia (R47.01);Aphonia (R49.1);Cognitive communication deficit (R41.841) Plan: Continue with current plan of care       GO                PageMelanee Miles 12/23/2017, 10:41 AM

## 2017-12-23 NOTE — Progress Notes (Signed)
Called Respiratory Tammie Miles) asked if she could take a look at the set up. Strap is stretched very much on trach collar. She said she will come and place a new set up.

## 2017-12-23 NOTE — Progress Notes (Addendum)
NEUROHOSPITALISTS STROKE TEAM - DAILY PROGRESS NOTE   SUBJECTIVE Her SLP is at the bedside.  Going to trial PMV. Now has a 6 cuffless trach.  OBJECTIVE Most recent Vital Signs: Vitals:   12/23/17 0726 12/23/17 0800 12/23/17 1113 12/23/17 1127  BP: 129/86  135/87   Pulse: 86 86 89 88  Resp: _0 Temp: 98 F (36.7 C)  98.6 F (37 C)   TempSrc: Axillary  Oral   SpO2: 98% 97% 97% 99%  Weight:      Height:       CBG (last 3)  Recent Labs    12/22/17 2006 12/23/17 0020 12/23/17 0400  GLUCAP 113* 101* 117*     CBC Latest Ref Rng & Units 12/22/2017 12/19/2017 12/18/2017  WBC 4.0 - 10.5 K/uL 9.6 11.0(H) 10.8(H)  Hemoglobin 12.0 - 15.0 g/dL 9.3(L) 8.2(L) 8.5(L)  Hematocrit 36.0 - 46.0 % 31.9(L) 28.0(L) 28.2(L)  Platelets 150 - 400 K/uL 385 473(H) 488(H)    BMP Latest Ref Rng & Units 12/22/2017 12/18/2017 12/17/2017  Glucose 70 - 99 mg/dL 136(H) 113(H) 120(H)  BUN 6 - 20 mg/dL _1 Creatinine 0.44 - 1.00 mg/dL 0.81 0.75 0.81  Sodium 135 - 145 mmol/L 139 139 136  Potassium 3.5 - 5.1 mmol/L 3.6 3.9 3.9  Chloride 98 - 111 mmol/L 102 103 100  CO2 22 - 32 mmol/L _2 Calcium 8.9 - 10.3 mg/dL 10.0 9.4 9.5     Physical Exam  HEENT-status post left frontoparietal craniotomy with wound clean dry, soft to touch, L cranial edema Cardiovascular- S1-S2 audible, pulses palpable throughout   Lungs- breath sounds clear bilaterally, Status post tracheostomy with trach collar, nonlabored breathing Abdomen- bowel sounds present, RLQ wound CDI musculoskeletal-no joint tenderness, deformity or swelling Skin-warm and dry Neurological Exam:  Awake, alert, globally aphasic, does not follow commands.  Eyes open, left gaze preference improved, able to cross midline, PERRL, mild left ptosis. Not blinking to visual threat on the right, but able to trak objects on the right. Right facial droop. Tongue protrusion not cooperative. LUE 5/5  and able to against gravity. LLE withdraw to pain 4/5. RUE 2-/5 on pain stimuli and RLE 2/5 to pain. Increasing RUE tone. B/l babinski becoming muted. Sensation, coordination and gait not otherwise tested.   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 15 mm of left-to-right midline shift.  MRI of the brain: Post craniotomy 8-9 mm of right-to-left midline shift with mild left uncal herniation similar to previous. Underlying evolving large acute ischemic left MCA territory infarct involving the majority of the left MCA territory.  Repeat Head CT post evacuation 9/20 - Left MCA territory infarct with evacuated hematoma. Midline shift measures 8 mm.   UDS - positive for amphetamines ( Pt takes Adderal at home)  2D Echocardiogram EF 60 to 65%  LE venous doppler negative for DVT  Hypercoagulable labs negative except high ESR and CRP  LDL 64  HgbA1c 5.8  VTE: Heparin subq   On ASA 81 for stroke prevention  Therapy recommendations: SNF  Disposition:  Pending   Helmet ordered last week for delivery this week  New bed in place on Friday now removed, ? why  SLP working on [employer] trials  Acute respiratory failure in the setting of massive IPH  Status post tracheostomy on 12/09/17  Doing well on trach collar  Now wiwth #6 cuffless trach  Chest x-ray done on 12/14/2017 shows no acute disease   Seizure  Episode of stiffness, tongue biting on 12/03/17  Overnight EEG no seizure  Increased keppra from 500 to 1060m bid  No more seizure since  Seizure  precautions  Hypertension emergency  Stable now at 110-160s  BP elevated on admission  On norvasc 10, lisinopril 20 bid and labetalol 300 tid  Off minoxidil and HCTZ   BP goal normotensive  Anemia  Hb 11.8 on admission  Hemoglobin 7.3-->8.0->8.3->8.5->8.2 -> 9.3  Received one unit PRBC during this admission    Continue to monitor  Iron study iron 36 and ferritin 66  Pt currently on  iron pills  Leukocytosis, resolved  WBC 9.4-->9.5->10.4->11.0->9.6  No fever   cultures neg  Dysphagia, secondary to stroke  PEG tube placed on 12/09/2017 for tube feeding  Accu-Chek every 4 hours while on tube feeding  On free water  On bolus tube feeds   Other Stroke Risk Factors  ETOH use   Other Active Problems  ADHD - on Adderell at home  Anxiety  Depression  Hypokalemia, resolved   Hospital day # 2[place] MSN, APRN, ANVP-BC, AGPCNP-BC Advanced Practice Stroke Nurse CMolinofor Schedule & Pager information 12/23/2017 12:14 PM  I have personally examined this patient, reviewed notes, independently viewed imaging studies, participated in medical decision making and plan of care.ROS completed by me personally and pertinent positives fully documented  I have made any additions or clarifications directly to the above note. Agree with note above.    PAntony Contras MD Medical Director [facility] Pager: [phone]/15/2019 1:28 PM      To contact Stroke Continuity provider, please refer to [email or web address] After hours, contact General Neurology

## 2017-12-23 NOTE — Progress Notes (Signed)
Nutrition Follow-up  DOCUMENTATION CODES:   Obesity unspecified  INTERVENTION:  Continue bolus tube feeds using Jevity 1.2 cal formula via PEG at goal of 360 ml QID.   Continue 70 ml free water flushes before and after each bolus feed.   Tube feeding regimen to provide 1728 kcal, 80 grams of protein, and 1726 ml water.   NUTRITION DIAGNOSIS:   Inadequate oral intake related to inability to eat as evidenced by NPO status; ongoing  GOAL:   Patient will meet greater than or equal to 90% of their needs; met with TF  MONITOR:   TF tolerance, Labs, Skin, Weight trends, I & O's  REASON FOR ASSESSMENT:   Consult Enteral/tube feeding initiation and management  ASSESSMENT:   Pt with PMH of HTN with prescribed stimulant use admitted with large L hemorrhagic basal ganglia with 15 mm shift with early herniation s/p L frontotemporal craniotomy for evacuation of hematoma.   9/19 intubated, OR for crani evacuation 9/20 decompressive L craniectomy 10/1 trach, PEG 10/4 transitioned to TC    Pt has been tolerating her bolus feeds via PEG. Pt continues NPO status. Pt working with SLP on po trials and attempts and trails of PMV. RD to continue with current tube feeding regimen. RD to continue to monitor. Search for SNF placement ongoing.   Labs and medications reviewed.   Diet Order:  NPO Diet Order    None      EDUCATION NEEDS:   No education needs have been identified at this time  Skin:  Skin Assessment: Skin Integrity Issues: Skin Integrity Issues:: Incisions Incisions: L head, abdomen  Last BM:  10/14  Height:   Ht Readings from Last 1 Encounters:  12/01/17 5' 1"  (1.549 m)    Weight:   Wt Readings from Last 1 Encounters:  12/20/17 68.2 kg    Ideal Body Weight:  47.7 kg  BMI:  Body mass index is 28.41 kg/m.  Estimated Nutritional Needs:   Kcal:  1600-1800  Protein:  80-100 grams  Fluid:  > 1.6 L/day    Corrin Parker, MS, RD, LDN Pager #  [phone] After hours/ weekend pager # [phone]

## 2017-12-24 LAB — GLUCOSE, CAPILLARY
GLUCOSE-CAPILLARY: 104 mg/dL — AB (ref 70–99)
GLUCOSE-CAPILLARY: 109 mg/dL — AB (ref 70–99)
GLUCOSE-CAPILLARY: 132 mg/dL — AB (ref 70–99)
GLUCOSE-CAPILLARY: 149 mg/dL — AB (ref 70–99)
Glucose-Capillary: 134 mg/dL — ABNORMAL HIGH (ref 70–99)

## 2017-12-24 MED ORDER — LABETALOL HCL 200 MG PO TABS
300.0000 mg | ORAL_TABLET | Freq: Three times a day (TID) | ORAL | Status: DC
  Administered 2017-12-24 – 2017-12-27 (×9): 300 mg via ORAL
  Filled 2017-12-24 (×9): qty 1

## 2017-12-24 NOTE — Progress Notes (Addendum)
NEUROHOSPITALISTS STROKE TEAM - DAILY PROGRESS NOTE   SUBJECTIVE Lying in bed. Trach in place, slight bloody drainage. L hand restrained, mitt on.   OBJECTIVE Most recent Vital Signs: Vitals:   12/24/17 0734 12/24/17 0746 12/24/17 1117 12/24/17 1143  BP: (!) 150/93  133/80   Pulse: 89 92 97 94  Resp: _0 Temp: 99.4 F (37.4 C)  97.7 F (36.5 C)   TempSrc: Oral  Oral   SpO2: 100% 100% 100% 100%  Weight:      Height:       CBG (last 3)  Recent Labs    12/23/17 2346 12/24/17 0305 12/24/17 0733  GLUCAP 134* 109* 104*     CBC Latest Ref Rng & Units 12/22/2017 12/19/2017 12/18/2017  WBC 4.0 - 10.5 K/uL 9.6 11.0(H) 10.8(H)  Hemoglobin 12.0 - 15.0 g/dL 9.3(L) 8.2(L) 8.5(L)  Hematocrit 36.0 - 46.0 % 31.9(L) 28.0(L) 28.2(L)  Platelets 150 - 400 K/uL 385 473(H) 488(H)    BMP Latest Ref Rng & Units 12/22/2017 12/18/2017 12/17/2017  Glucose 70 - 99 mg/dL 136(H) 113(H) 120(H)  BUN 6 - 20 mg/dL _1 Creatinine 0.44 - 1.00 mg/dL 0.81 0.75 0.81  Sodium 135 - 145 mmol/L 139 139 136  Potassium 3.5 - 5.1 mmol/L 3.6 3.9 3.9  Chloride 98 - 111 mmol/L 102 103 100  CO2 22 - 32 mmol/L _2 Calcium 8.9 - 10.3 mg/dL 10.0 9.4 9.5     Physical Exam  HEENT-status post left frontoparietal craniotomy with wound clean dry, soft to touch, L cranial edema Cardiovascular- S1-S2 audible, pulses palpable throughout   Lungs- breath sounds with crackles bilaterally, Status post tracheostomy with trach collar, nonlabored breathing Abdomen- bowel sounds present, RLQ wound CDI musculoskeletal-no joint tenderness, deformity or swelling Skin-warm and dry Neurological Exam:  Awake, alert, globally aphasic, does not follow commands.  Eyes open, left gaze preference improved, able to cross midline, PERRL, mild left ptosis. Not blinking to visual threat on the right, but able to trak objects on the right. Right facial droop. Tongue protrusion  not cooperative. LUE 5/5 and able to against gravity. LLE 5/5. RUE 2-/5 on pain stimuli and RLE 2/5 to pain. Increasing RUE tone. B/l babinski becoming muted. Sensation, coordination and gait not otherwise tested.   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 15 mm of left-to-right midline shift.  MRI of the brain: Post craniotomy 8-9 mm of right-to-left midline shift with mild left uncal herniation similar to previous. Underlying evolving large acute ischemic left MCA territory infarct involving the majority of the left MCA territory.  Repeat Head CT post evacuation 9/20 - Left MCA territory infarct with evacuated hematoma. Midline shift measures 8 mm.   UDS - positive for amphetamines ( Pt takes Adderal at home)  2D Echocardiogram EF 60 to 65%  LE venous doppler negative for DVT  Hypercoagulable labs negative except high ESR and CRP  LDL 64  HgbA1c 5.8  VTE: Heparin subq   On ASA 81 for stroke prevention  Therapy recommendations: SNF  Disposition: Pending  Helmet ordered last week for delivery this week. Still not here. Will check on.  New bed in place on Friday now removed, ? why  SLP working on [employer] trials  Acute respiratory failure in the setting of massive IPH  Status post tracheostomy on 12/09/17  Doing well on trach collar  Chest x-ray done on 12/14/2017 shows no acute disease   Pulled out trach during the night, replaced with #4 shiley/cuffless. Did not experience respiratory distress during episode  Repeat CXR in am  Seizure  Episode of stiffness,  tongue biting on 12/03/17  Overnight EEG no seizure  Increased keppra from 500 to 1046m bid  No more seizure since  Seizure precautions  Hypertension emergency  Stable now at 150s  BP elevated on admission  On norvasc 10, lisinopril 20 bid and labetalol 300 tid  Off minoxidil and HCTZ   BP goal normotensive  Anemia  Hb 11.8 on admission  Hemoglobin 7.3-->8.0->8.3->8.5->8.2 -> 9.3  Received one unit PRBC during this admission    Continue to monitor  Iron study iron 36 and ferritin 66  Pt currently on  iron pills  Leukocytosis, resolved  WBC 9.6  No fever   cultures neg  Dysphagia, secondary to stroke  PEG tube placed on 12/09/2017 for tube feeding  Accu-Chek every 4 hours while on tube feeding  On free water  On bolus tube feeds   Other Stroke Risk Factors  ETOH use   Other Active Problems  ADHD - on Adderell at home  Anxiety  Depression  Hypokalemia, resolved   Hospital day # 2[place] MSN, APRN, ANVP-BC, AGPCNP-BC Advanced Practice Stroke Nurse [place]for Schedule & Pager information 12/24/2017 12:06 PM   ATTENDING NOTE: I reviewed above note and agree with the assessment and plan. Pt was seen and examined.  No familyat bedside. Patient lying in bed not in distress, still has mild to moderate secretion.  However, patient overnight pulled out trach tube, rapid response was called and RT replaced trach tube.  Patient has been in restraints since then.  BP and anemia stable.  Still has right hemiplegia and global aphasia, on pain summation right upper and lower extremity 2+/5 distally.  PT/OT recommend SNF, and social workerisworking on placement.  JRosalin Hawking MD PhD Stroke Neurology 12/24/2017 4:48 PM    To contact Stroke Continuity provider, please refer to [email or web address] After hours, contact General Neurology

## 2017-12-24 NOTE — Progress Notes (Signed)
[id number] Outside of patient room heard patient coughing. Upon entering room found patient with trach in hand and bleeding from trach site. Patient maintaing oxygen saturation 97-99 %. Respiratory therapy and rapid response. Staff attempted to placed arbitrator but patient coughing forcefully and keeps coming out. When respiratory therapy arrived at bedside able to place # 4 shiley. [id number] Dr.Kirpatrick notified .Order received for soft wrist restraint to keep patient from pulling out trach. [id number] Tammie Miles  Patient son notified of event and need for soft wrist restraint. Patient son stated," It's okay for restraint use." [phone] Dr. Levada Schilling notified of event .

## 2017-12-24 NOTE — Progress Notes (Signed)
NAME:  Tammie Miles, MRN:  [id number], DOB:  16-Feb-1976, LOS: 27 ADMISSION DATE:  11/27/2017, CONSULTATION DATE:  11/27/2017 REFERRING MD:  Dr. Conchita Paris CHIEF COMPLAINT:  Left frontal hemorrhage  Brief History   42 yoF admitted 9/19 with AMS that began the evening of 9/18, initially thought due to intoxication. Remained unresponsive through am of 9/19.  Patient was brought to the ED where a CT was done and showed a large left sided ICH. Patient was taken to the OR and a mini craniectomy was done.  Patient was brought out to the ICU and PCCM was consulted for vent management. Course complicated by cerebral edema requiring 3% saline limited by hypernatremia. Also complicated by encephalopathy. Tolerating vent wean, but mental status limited extubation. Then 9/25 she developed what appeared to be seizures with gaze deviation and tongue biting. EEG done overnight with results pending and AEDs adjusted. No further seizure seen.   Significant Hospital Events   9/19 Admit/ OR- mini crani 9/20 Decompressive craniectomy ( L)  with skull harvesting in the abdomen 9/21 hypertonic saline started 9/23 hypertonic stopped.  9/25 Seizure. EEG done with no sz activity. AEDs adjusted. 10/1 Trach / PEG 10/2 Trach collar    Consults: date of consult/date signed off & final recs  9/19 NSGY 9/19 PCCM   Procedures (surgical and bedside):  9/19 Intubated 9/19 OR for crani, evac of hematoma>> no change in neuro status post op 9/20 Decompressive craniectomy ( L)  with skull harvesting in the abdomen  Significant Diagnostic Tests:  9/19 [facility]  Large area of hemorrhage in the left basal ganglia and left frontal lobe, 5.8 x 3.5 cm. 15 mm of left-to-right midline shift. 9/19 CTA head: Positive for left MCA M1 large vessel occlusion, and also severe stenosis at the left ICA terminus, superimposed on the relatively large acute left hemisphere intra-axial hemorrhage (estimated blood volume 43 mL). Vasospasm suspected in  the proximal ACAs. 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 2. Negative for intracranial aneurysm, CTA spot sign, or evidence of vascular malformation in association with the acute hemorrhage. 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 3. Stable intracranial mass effect since 0938 hours today, with 16 mm of leftward midline shift and trapping of the right lateral ventricle. 11/29/2017 CT Head Large left MCA infarct is stable in distribution. Interval left frontal craniectomy with mild herniation of left frontal lobe through the defect. Resolved uncal herniation, improved patency of left lateral ventricle, and 6 mm left-to-right midline shift, previously 8 mm. Partial interval dispersion of acute hemorrhage in the left basal ganglia. Subcentimeter density anterior to the prior position of blood products possibly representing retracted clot or interval hemorrhage, attention at follow-up recommended. CT 9/24: 1. Left MCA distribution infarction is stable in distribution. Mild increased edema of the infarct with increased herniation of the left frontal lobe via the left frontal craniectomy. 2. Stable 6 mm left-to-right midline shift and partial effacement of left lateral ventricle. 3. Stable small hematoma within the left basal ganglia. 4. No new acute intracranial abnormality identified.  Micro Data: 9/19 MRSA PCR >> Negative 9/19 Sputum Culture >> normal flora 9/22 Blood x 2 >> negative 9/22 UA >> Normal 9/22 Sputum >> few candida dubliniensis 12/13/2017 blood cultures x2>> 12/13/2017 urine culture>> 12/13/2017 sputum culture>>  Antimicrobials:  9/19 Cefazolin (pre-op) 9/20  Subjective:  Patient pulled a  size 6 cuffless trach this AM and was replaced by a size 6 and had some bloody secretions that are now  resolved.  Failed swallow evaluation  Objective   Blood pressure 133/80, pulse 94, temperature 97.7 F (36.5 C), temperature source Oral, resp. rate 18, height 5\' 1"  (1.549 m), weight 68.2 kg, SpO2 100 %.    FiO2 (%):  [28 %] 28 %  No intake or output data in the 24 hours ending 12/24/17 1334 Filed Weights   12/17/17 0413 12/18/17 0335 12/20/17 1955  Weight: 75.1 kg 69.7 kg 68.2 kg   No intake or output data in the 24 hours ending 12/24/17 1334 Examination:  General: Chronically ill appearing female at thsi  HEENT: Trach in place, minimal bloody drainage Neuro: Follows some commands CV: RRR, Nl S1/S2 and -M/R/G PULM: Upper airway sounds GI: Soft, NT, ND and +BS Extremities: -edema and -tenderness Skin: Intact  I reviewed CXR myself, trach is in a good position  Resolved Hospital Problem list     Assessment & Plan:  42 year old female s/p crani for MCA CVA now with a trach  Acute respiratory insufficiency in the setting of massive IPH - s/p trach placement 10/1. - Maintain on TC as tolerated - Monitor for bleeding - ?repeat swallow evaluation now that trach is smoker - PMV with SLP  Hypoxemia - Titrate O2 for sat of 88-92%  Dysphagia: - Continue SLP efforts  Discussed with PCCM-NP.  Disposition / Summary of Today's Plan 12/24/17   Monitor for bleeding after this AM events    Diet: TF  Pain/Anxiety/Delirium protocol: None DVT prophylaxis: SCD's GI prophylaxis: pepcid  Hyperglycemia protocol: None Mobility: BR Code Status: full Family Communication: 9/30 spoke with son, reviewed patients overall state of health.  He is very articulate in his understanding of her illness.   He requests that we continue full support / proceed with trach as he wishes to give his little sister as much time with her mother as possible (she is 7).   10/7 no family available.  12/19/2017 no family at bedside  Labs   CBC: Recent Labs  Lab 12/18/17 0426 12/19/17 0352 12/22/17 1220   WBC 10.8* 11.0* 9.6  HGB 8.5* 8.2* 9.3*  HCT 28.2* 28.0* 31.9*  MCV 81.5 81.9 83.1  PLT 488* 473* 385   Basic Metabolic Panel: Recent Labs  Lab 12/18/17 0426 12/22/17 1220  NA 139 139  K 3.9 3.6  CL 103 102  CO2 25 25  GLUCOSE 113* 136*  BUN 17 13  CREATININE 0.75 0.81  CALCIUM 9.4 10.0   GFR: Estimated Creatinine Clearance: 80 mL/min (by C-G formula based on SCr of 0.81 mg/dL). Recent Labs  Lab 12/18/17 0426 12/19/17 0352 12/22/17 1220  WBC 10.8* 11.0* 9.6   Liver Function Tests: No results for input(s): AST, ALT, ALKPHOS, BILITOT, PROT, ALBUMIN in the last 168 hours. No results for input(s): LIPASE, AMYLASE in the last 168 hours. No results for input(s): AMMONIA in the last 168 hours. ABG    Component Value Date/Time   PHART 7.444 12/08/2017 0414   PCO2ART 36.7 12/08/2017 0414   PO2ART 109 (H) 12/08/2017 0414   HCO3 24.6 12/08/2017 0414   TCO2 27 12/05/2017 1216   ACIDBASEDEF 6.0 (H) 11/29/2017 0310   O2SAT 98.2 12/08/2017 0414    Coagulation Profile: No results for input(s): INR, PROTIME in the last 168 hours. Cardiac Enzymes: No results for input(s): CKTOTAL, CKMB, CKMBINDEX, TROPONINI in the last 168 hours. HbA1C:  Hgb A1c MFr Bld  Date/Time Value Ref Range Status  11/28/2017 08:32 AM 5.8 (H) 4.8 - 5.6 % Final    Comment:    (NOTE) Pre diabetes:          5.7%-6.4% Diabetes:              >6.4% Glycemic control for   <7.0% adults with diabetes    CBG: Recent Labs  Lab 12/23/17 1656 12/23/17 1952 12/23/17 2346 12/24/17 0305 12/24/17 0733  GLUCAP 83 170* 134* 109* 104*   Alyson Reedy, M.D. [facility] Pulmonary/Critical Care Medicine. Pager: [phone]. After hours pager: [phone].

## 2017-12-24 NOTE — Significant Event (Signed)
Rapid Response Event Note  Called to bedside after patient pulled out her trach. Upon arrival, there was bleeding from the site, RT was able to replace the trach (#4). Prior to and during reinsertion, patient was not in respiratory, saturations were 99% on RA, trach was replaced + color change (EZ Cap).   RN to notify Neuro MD on call (Primary) and call [facility] MD and notify CCM as well.   Call Time 527 Arrival Time 528 End Time 545

## 2017-12-24 NOTE — Progress Notes (Signed)
Called to bedside to replace trach, which the patient had pulled out.  There was significant bleeding from the site.  Trach replaced with #4 shiley cuffless trach on the first attempt. Placement confirmed by easy cap with positive color change.  BBS equal, clear.  Patient has strong cough, bloody secretions.  Vital signs stable throughout.  Of note, patient had a # 6 shiley cuffless trach prior to incident.  Patient tolerated well.

## 2017-12-25 ENCOUNTER — Inpatient Hospital Stay (HOSPITAL_COMMUNITY): Payer: Medicaid Other

## 2017-12-25 LAB — GLUCOSE, CAPILLARY
GLUCOSE-CAPILLARY: 120 mg/dL — AB (ref 70–99)
GLUCOSE-CAPILLARY: 116 mg/dL — AB (ref 70–99)
GLUCOSE-CAPILLARY: 114 mg/dL — AB (ref 70–99)
GLUCOSE-CAPILLARY: 113 mg/dL — AB (ref 70–99)
Glucose-Capillary: 90 mg/dL (ref 70–99)
Glucose-Capillary: 106 mg/dL — ABNORMAL HIGH (ref 70–99)
Glucose-Capillary: 101 mg/dL — ABNORMAL HIGH (ref 70–99)

## 2017-12-25 NOTE — Progress Notes (Signed)
STROKE TEAM - DAILY PROGRESS NOTE   SUBJECTIVE Lying in bed. Trach in place, breathing comfortably. L hand restrained, mitt on. Speech Therapy is evaluating patient  OBJECTIVE Most recent Vital Signs: Vitals:   12/25/17 0322 12/25/17 0414 12/25/17 0741 12/25/17 0849  BP: (!) 156/93 129/88 119/84   Pulse: 82 80 75 88  Resp: 18 18 18 18   Temp: 98.5 F (36.9 C)  98.4 F (36.9 C)   TempSrc: Axillary  Axillary   SpO2: 100% 99% 100% 99%  Weight:      Height:       CBG (last 3)  Recent Labs    12/24/17 1930 12/24/17 2338 12/25/17 0341  GLUCAP 149* 132* 106*     CBC Latest Ref Rng & Units 12/22/2017 12/19/2017 12/18/2017  WBC 4.0 - 10.5 K/uL 9.6 11.0(H) 10.8(H)  Hemoglobin 12.0 - 15.0 g/dL 9.3(L) 8.2(L) 8.5(L)  Hematocrit 36.0 - 46.0 % 31.9(L) 28.0(L) 28.2(L)  Platelets 150 - 400 K/uL 385 473(H) 488(H)    BMP Latest Ref Rng & Units 12/22/2017 12/18/2017 12/17/2017  Glucose 70 - 99 mg/dL 136(H) 113(H) 120(H)  BUN 6 - 20 mg/dL 13 17 14   Creatinine 0.44 - 1.00 mg/dL 0.81 0.75 0.81  Sodium 135 - 145 mmol/L 139 139 136  Potassium 3.5 - 5.1 mmol/L 3.6 3.9 3.9  Chloride 98 - 111 mmol/L 102 103 100  CO2 22 - 32 mmol/L 25 25 26   Calcium 8.9 - 10.3 mg/dL 10.0 9.4 9.5     Physical Exam  HEENT-status post left frontoparietal craniotomy with wound clean dry, soft to touch, L cranial edema Cardiovascular- S1-S2 audible, pulses palpable throughout   Lungs- breath sounds with crackles bilaterally, Status post tracheostomy with trach collar, nonlabored breathing Abdomen- bowel sounds present, RLQ wound CDI musculoskeletal-no joint tenderness, deformity or swelling Skin-warm and dry Neurological Exam:  Awake, alert, globally aphasic, does not follow commands.  Eyes open, left gaze preference improved, able to cross midline, PERRL, mild left ptosis. Not blinking to visual threat on the right, but able to trak objects on the right. Right  facial droop. Tongue protrusion not cooperative. LUE 5/5 and able to against gravity. LLE 5/5. RUE 2-/5 on pain stimuli and RLE 2/5 to pain. Increasing RUE tone. B/l babinski becoming muted. Sensation, coordination and gait not otherwise tested.   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 15 mm of left-to-right midline shift.  MRI of the brain: Post craniotomy 8-9 mm of right-to-left midline shift with mild left uncal herniation similar to previous. Underlying evolving large acute ischemic left MCA territory infarct involving the majority of the left MCA territory.  Repeat Head CT post evacuation 9/20 - Left MCA territory infarct with evacuated hematoma. Midline shift measures 8 mm.   UDS - positive for amphetamines ( Pt takes Adderal at home)  2D Echocardiogram EF 60 to 65%  LE venous doppler negative for DVT  Hypercoagulable labs negative except high ESR and CRP  LDL 64  HgbA1c 5.8  VTE: Heparin subq   On ASA 81 for stroke prevention  Therapy recommendations: SNF  Disposition: Pending   Helmet ordered last week for delivery this week. Still not here. Will check on.  New bed in place on Friday now removed, ? why  SLP working on [employer] trials  Acute respiratory failure in the setting of massive IPH  Status post tracheostomy on 12/09/17  Doing well on trach collar  Chest x-ray done on 12/14/2017 shows no acute disease   Pulled out trach during the night, replaced with #4 shiley/cuffless. Did not experience respiratory distress during episode  Repeat CXR in  am  Seizure  Episode of stiffness, tongue biting on 12/03/17  Overnight EEG no seizure  Increased keppra from 500 to 1080m bid  No more seizure since  Seizure precautions  Hypertension emergency  Stable now at 150s  BP elevated on admission  On norvasc 10, lisinopril 20 bid and labetalol 300 tid  Off minoxidil and HCTZ   BP goal normotensive  Anemia  Hb 11.8 on admission  Hemoglobin 7.3-->8.0->8.3->8.5->8.2 -> 9.3  Received one unit PRBC during this admission    Continue to monitor  Iron study iron 36 and ferritin 66  Pt currently on  iron pills  Leukocytosis, resolved  WBC 9.6  No fever   cultures neg  Dysphagia, secondary to stroke  PEG tube placed on 12/09/2017 for tube feeding  Accu-Chek every 4 hours while on tube feeding  On free water  On bolus tube feeds   Other Stroke Risk Factors  ETOH use   Other Active Problems  ADHD - on Adderell at home  Anxiety  Depression  Hypokalemia, resolved   Hospital day # 28    ATTENDING NOTE: I reviewed above note and agree with the assessment and plan. Pt was seen and examined.  No familyat bedside. Patient lying in bed not in distress, still has mild to moderate secretion.    Patient has been in restraints   BP and anemia stable.  Still has right hemiplegia and global aphasia,  Speech therapist recommends NPO.  PT/OT recommend SNF, and social workerisworking on placement.  PAntony Contras MD Stroke Neurology 12/25/2017 11:07 AM    To contact Stroke Continuity provider, please refer to [email or web address] After hours, contact General Neurology

## 2017-12-25 NOTE — Progress Notes (Signed)
  Speech Language Pathology Treatment: Cognitive-Linquistic  Patient Details Name: Tammie Miles MRN: [id number] DOB: [birth year] Today's Date: 12/25/2017 Time: 3244-0102 SLP Time Calculation (min) (ACUTE ONLY): 16 min  Assessment / Plan / Recommendation Clinical Impression  Patient less interactive with therapist today. Able to demonstrate focused attention via eye contact when instructed to "look at me" in 30% of opportunities. Unable to follow commands despite max cues. SLP did provide finger occlusion to assess readiness for PMV and patient able to demonstrate exchange of air through upper airway for 4-5 breath cycles without evidence of distress. Not receptive to opening mouth for po trials despite max tactile cueing. Increased lethargy today prohibited trials of PMV however if patient continues to tolerate finger occlusion, would consider PMV evaluation in the future.    HPI HPI: 35 yoF with PMH significant for HTN, anxiety, ADHD, admitted 9/19 with large L hemorrhagic basal ganglia with 15 mm shift with early herniation s/p L frontotemporal crani 9/19 for evacuation of hematoma. ETT 9/19; 9/20 returned to OR for decompressive craniectomy ; 10/1 trach/PEG; transitioned to TC and off vent 10/5. CT 9/24: 1. Left MCA distribution infarction is stable in distribution. Mild increased edema of the infarct with increased herniation of the left frontal lobe via the left frontal craniectomy. 2. Stable 6 mm left-to-right midline shift and partial effacement of left lateral ventricle. 3. Stable small hematoma within the left basal ganglia. CXR 10/9: no acute disease process. Pt trach changed yesterday to Shiley 6 cuffless      SLP Plan  Continue with current plan of care       Recommendations  Diet recommendations: NPO                Oral Care Recommendations: Oral care QID Follow up Recommendations: Skilled Nursing facility SLP Visit Diagnosis: Dysphagia, oropharyngeal phase (R13.12);Aphasia  (R47.01);Aphonia (R49.1);Cognitive communication deficit (R41.841) Plan: Continue with current plan of care       GO                Corry Storie Meryl 12/25/2017, 9:53 AM

## 2017-12-25 NOTE — Progress Notes (Signed)
Physical Therapy Treatment Patient Details Name: Tammie Miles MRN: [id number] DOB: [birth year] Today's Date: 12/25/2017    History of Present Illness Patient is a 42 y/o female who was admitted for AMS/unresponsiveness. 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 Returned to OR 9/20 for decompressive crani with left skull harvested in abdomen. 9/25 developed seizures. Intubated 9/19-10/7. s/p trach and peg 10/1. PMH includes HTN, depression, anxiety.     PT Comments    Patient received in bed. PT session focusing on improving functional mobility. Performing rolling R <> L for peri care with ability to use L UE to assist with rolling. Supine to sit with patient pulling with L UE on PT to circle sit in bed. Able to facilitate L LE towards EOB as well as use L UE to assist in guiding R LE towards EOB. Sitting EOB x ~4 min with patient maintaining balance with 1 UE support on bed rail for majority of time. Making good progress towards goals.     Follow Up Recommendations  SNF     Equipment Recommendations  None recommended by PT    Recommendations for Other Services       Precautions / Restrictions Precautions Precautions: Fall;Other (comment) Precaution Comments: trach collar, PEG, no bone flap L skull Required Braces or Orthoses: Other Brace/Splint Other Brace/Splint: Bilateral PRAFO Boots, helmet Restrictions Weight Bearing Restrictions: No    Mobility  Bed Mobility Overal bed mobility: Needs Assistance Bed Mobility: Supine to Sit;Sit to Supine;Rolling Rolling: Mod assist;+2 for physical assistance   Supine to sit: Min assist;+2 for physical assistance Sit to supine: Max assist;+2 for physical assistance   General bed mobility comments: patient able to use L UE to pull up from PT; able to circle sit in bed and initiate L  LE towards EOB and use L UE to guide R LE  Transfers                 General transfer comment: deferred this session   Ambulation/Gait                 Stairs             Wheelchair Mobility    Modified Rankin (Stroke Patients Only)       Balance Overall balance assessment: Needs assistance Sitting-balance support: Feet supported;Single extremity supported;No upper extremity supported Sitting balance-Leahy Scale: Poor Sitting balance - Comments: up to min A for upright balance; otherwise able to use L UE on bed rail to steady self for up to 4 min Postural control: Right lateral lean                                  Cognition Arousal/Alertness: Awake/alert Behavior During Therapy: Flat affect;Restless Overall Cognitive Status: Difficult to assess Area of Impairment: Following commands                       Following Commands: Follows one step commands with increased time;Follows one step commands inconsistently       General Comments: pt able to follow more 1 step commands and appropriately using L UE/LE during session       Exercises      General Comments General comments (skin integrity, edema, etc.): patient coughing thorughout; PT requesting  nursing staff for suction; when returning to supine PT observing nystagmus, however patient unable to verbalize dizziness?      Pertinent Vitals/Pain Pain Assessment: Faces Faces Pain Scale: No hurt    Home Living                      Prior Function            PT Goals (current goals can now be found in the care plan section) Acute Rehab PT Goals Patient Stated Goal: unable to participate with goals PT Goal Formulation: Patient unable to participate in goal setting Time For Goal Achievement: 12/30/17 Potential to Achieve Goals: Fair Progress towards PT goals: Progressing toward goals    Frequency    Min 3X/week      PT Plan Current plan remains  appropriate    Co-evaluation              AM-PAC PT "6 Clicks" Daily Activity  Outcome Measure  Difficulty turning over in bed (including adjusting bedclothes, sheets and blankets)?: Unable Difficulty moving from lying on back to sitting on the side of the bed? : Unable Difficulty sitting down on and standing up from a chair with arms (e.g., wheelchair, bedside commode, etc,.)?: Unable Help needed moving to and from a bed to chair (including a wheelchair)?: Total Help needed walking in hospital room?: Total Help needed climbing 3-5 steps with a railing? : Total 6 Click Score: 6    End of Session Equipment Utilized During Treatment: Oxygen Activity Tolerance: Patient tolerated treatment well Patient left: in bed;with call bell/phone within reach;with restraints reapplied;with nursing/sitter in room Nurse Communication: Mobility status;Need for lift equipment PT Visit Diagnosis: Hemiplegia and hemiparesis Hemiplegia - Right/Left: Right Hemiplegia - caused by: Nontraumatic intracerebral hemorrhage     Time: 9604-5409 PT Time Calculation (min) (ACUTE ONLY): 21 min  Charges:  $Therapeutic Activity: 8-22 mins                     Kipp Laurence, PT, DPT Supplemental Physical Therapist 12/25/17 4:51 PM Pager: [phone] Office: [phone]

## 2017-12-26 LAB — GLUCOSE, CAPILLARY
GLUCOSE-CAPILLARY: 116 mg/dL — AB (ref 70–99)
GLUCOSE-CAPILLARY: 91 mg/dL (ref 70–99)
GLUCOSE-CAPILLARY: 112 mg/dL — AB (ref 70–99)
Glucose-Capillary: 114 mg/dL — ABNORMAL HIGH (ref 70–99)
Glucose-Capillary: 99 mg/dL (ref 70–99)
Glucose-Capillary: 96 mg/dL (ref 70–99)
Glucose-Capillary: 121 mg/dL — ABNORMAL HIGH (ref 70–99)

## 2017-12-26 NOTE — Progress Notes (Signed)
STROKE TEAM - DAILY PROGRESS NOTE   SUBJECTIVE Lying in bed. Trach in place, breathing comfortably. L hand restrained, mitt on. SNF placement pending and patient remains difficult to place OBJECTIVE Most recent Vital Signs: Vitals:   12/26/17 0840 12/26/17 0849 12/26/17 1113 12/26/17 1250  BP: 122/79   130/83  Pulse: 87 86 95 94  Resp: 15 18 20 17   Temp: 98.2 F (36.8 C)   98.4 F (36.9 C)  TempSrc: Axillary   Axillary  SpO2: 99% 99% 97% 99%  Weight:      Height:       CBG (last 3)  Recent Labs    12/26/17 0505 12/26/17 0834 12/26/17 1230  GLUCAP 114* 96 116*     CBC Latest Ref Rng & Units 12/22/2017 12/19/2017 12/18/2017  WBC 4.0 - 10.5 K/uL 9.6 11.0(H) 10.8(H)  Hemoglobin 12.0 - 15.0 g/dL 9.3(L) 8.2(L) 8.5(L)  Hematocrit 36.0 - 46.0 % 31.9(L) 28.0(L) 28.2(L)  Platelets 150 - 400 K/uL 385 473(H) 488(H)    BMP Latest Ref Rng & Units 12/22/2017 12/18/2017 12/17/2017  Glucose 70 - 99 mg/dL 136(H) 113(H) 120(H)  BUN 6 - 20 mg/dL 13 17 14   Creatinine 0.44 - 1.00 mg/dL 0.81 0.75 0.81  Sodium 135 - 145 mmol/L 139 139 136  Potassium 3.5 - 5.1 mmol/L 3.6 3.9 3.9  Chloride 98 - 111 mmol/L 102 103 100  CO2 22 - 32 mmol/L 25 25 26   Calcium 8.9 - 10.3 mg/dL 10.0 9.4 9.5     Physical Exam  HEENT-status post left frontoparietal craniotomy with wound clean dry, soft to touch, L cranial edema Cardiovascular- S1-S2 audible, pulses palpable throughout   Lungs- breath sounds with crackles bilaterally, Status post tracheostomy with trach collar, nonlabored breathing Abdomen- bowel sounds present, RLQ wound CDI musculoskeletal-no joint tenderness, deformity or swelling Skin-warm and dry Neurological Exam:  Awake, alert, globally aphasic, does not follow commands.  Eyes open, left gaze preference improved, able to cross midline, PERRL, mild left ptosis. Not blinking to visual threat on the right, but able to trak objects on the right.  Right facial droop. Tongue protrusion not cooperative. LUE 5/5 and able to against gravity. LLE 5/5. RUE 2-/5 on pain stimuli and RLE 2/5 to pain. Increasing RUE tone. B/l babinski becoming muted. Sensation, coordination and gait not otherwise tested.   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 15 mm of left-to-right midline shift.  MRI of the brain: Post craniotomy 8-9 mm of right-to-left midline shift with mild left uncal herniation similar to previous. Underlying evolving large acute ischemic left MCA territory infarct involving the majority of the left MCA territory.  Repeat Head CT post evacuation 9/20 - Left MCA territory infarct with evacuated hematoma. Midline shift measures 8 mm.   UDS - positive for amphetamines ( Pt takes Adderal at home)  2D Echocardiogram EF 60 to 65%  LE venous doppler negative for DVT  Hypercoagulable labs negative except high ESR and CRP  LDL 64  HgbA1c 5.8  VTE: Heparin subq   On ASA 81 for stroke prevention  Therapy recommendations:  SNF  Disposition: Pending   Helmet ordered last week for delivery this week. Still not here. Will check on.  New bed in place on Friday now removed, ? why  SLP working on [employer] trials  Acute respiratory failure in the setting of massive IPH  Status post tracheostomy on 12/09/17  Doing well on trach collar  Chest x-ray done on 12/14/2017 shows no acute disease   Pulled out trach during the night, replaced with #4 shiley/cuffless. Did not experience respiratory distress during episode  Repeat CXR in  am  Seizure  Episode of stiffness, tongue biting on 12/03/17  Overnight EEG no seizure  Increased keppra from 500 to 1063m bid  No more seizure since  Seizure precautions  Hypertension emergency  Stable now at 150s  BP elevated on admission  On norvasc 10, lisinopril 20 bid and labetalol 300 tid  Off minoxidil and HCTZ   BP goal normotensive  Anemia  Hb 11.8 on admission  Hemoglobin 7.3-->8.0->8.3->8.5->8.2 -> 9.3  Received one unit PRBC during this admission    Continue to monitor  Iron study iron 36 and ferritin 66  Pt currently on  iron pills  Leukocytosis, resolved  WBC 9.6  No fever   cultures neg  Dysphagia, secondary to stroke  PEG tube placed on 12/09/2017 for tube feeding  Accu-Chek every 4 hours while on tube feeding  On free water  On bolus tube feeds   Other Stroke Risk Factors  ETOH use   Other Active Problems  ADHD - on Adderell at home  Anxiety  Depression  Hypokalemia, resolved   Hospital day # 29    ATTENDING NOTE: I reviewed above note and agree with the assessment and plan. Pt was seen and examined.  No familyat bedside.   Still has right hemiplegia and global aphasia,  Speech therapist recommends NPO.  PT/OT recommend SNF, and social workerisworking on placement.  PAntony Contras MD Stroke Neurology 12/26/2017 2:11 PM    To contact Stroke Continuity provider, please refer to [email or web address] After hours, contact General Neurology

## 2017-12-26 NOTE — Care Management Note (Signed)
Case Management Note  Patient Details  Name: Tammie Miles MRN: [id number] Date of Birth: 1975/11/13  Subjective/Objective:                    Action/Plan: Patient continues on the DTP list for SNF rehab. CM following.  Expected Discharge Date:                  Expected Discharge Plan:  Skilled Nursing Facility  In-House Referral:  Clinical Social Work  Discharge planning Services  CM Consult  Post Acute Care Choice:    Choice offered to:     DME Arranged:    DME Agency:     HH Arranged:    HH Agency:     Status of Service:  In process, will continue to follow  If discussed at Long Length of Stay Meetings, dates discussed:    Additional Comments:  Kermit Balo, RN 12/26/2017, 11:03 AM

## 2017-12-26 NOTE — Progress Notes (Signed)
  Speech Language Pathology Treatment: Dysphagia  Patient Details Name: Tammie Miles MRN: [id number] DOB: 1975/11/19 Today's Date: 12/26/2017 Time: 7829-5621 SLP Time Calculation (min) (ACUTE ONLY): 30 min  Assessment / Plan / Recommendation Clinical Impression  Pt was seen for skilled ST targeting goals for PMSV use, communication, cognition, and dysphagia.  Upon therapist's arrival, pt was in bed, awake, and bed linens were soiled.  Pt was able to follow 1 step commands to assist in hygiene and changing of linens with mod-max cues.  Pt was able to consistently redirect air through the upper airway during trials of finger occlusion; therefore, PMSV was placed to determine readiness.  Pt tolerated speaking valve for 15 minute intervals with no changes in vital signs and no noticeable s/s of discomfort.  During periods of valve removal, no back pressure or evidence of air trapping was evident.  Despite physiological toleration of valve, pt still made no attempts to functionally communicate with therapist.  Pt was unable to mimic even basic vowel or consonant sounds or mimic nonspeech oral motor movements despite max multimodal cues.   She did accept trials of ice chips and thin liquids via teaspoon after oral care.  Pt demonstrated no overt s/s of aspiration with trials of ice chips and had delayed coughing with teaspoons of thin liquids.  Cough was noted to be much stronger with valve donned versus without.  Recommend that pt continue trials of valve placement with speech therapy only at this time to continue working on improving cough strength for pulmonary hygiene and to continue working towards PO diet.  Pt was left in bed with wrist restraint re-applied and mitt donned.  Continue per current plan of care.    HPI HPI: 28 yoF with PMH significant for HTN, anxiety, ADHD, admitted 9/19 with large L hemorrhagic basal ganglia with 15 mm shift with early herniation s/p L frontotemporal crani 9/19 for  evacuation of hematoma. ETT 9/19; 9/20 returned to OR for decompressive craniectomy ; 10/1 trach/PEG; transitioned to TC and off vent 10/5. CT 9/24: 1. Left MCA distribution infarction is stable in distribution. Mild increased edema of the infarct with increased herniation of the left frontal lobe via the left frontal craniectomy. 2. Stable 6 mm left-to-right midline shift and partial effacement of left lateral ventricle. 3. Stable small hematoma within the left basal ganglia. CXR 10/9: no acute disease process. Pt trach changed yesterday to Shiley 6 cuffless      SLP Plan  Continue with current plan of care       Recommendations  Diet recommendations: NPO Medication Administration: Via alternative means      Patient may use Passy-Muir Speech Valve: with SLP only         Follow up Recommendations: Skilled Nursing facility SLP Visit Diagnosis: Dysphagia, oropharyngeal phase (R13.12);Aphasia (R47.01);Aphonia (R49.1);Cognitive communication deficit (R41.841) Plan: Continue with current plan of care       GO                Jamielee Mchale, Melanee Spry 12/26/2017, 10:00 AM

## 2017-12-26 NOTE — Progress Notes (Signed)
CSW received call yesterday from Leonard with [facility]; requesting that CSW fax referral for patient. CSW faxed referral yesterday. This morning, Janel came to evaluate patient and spoke with RN. Janel contacted CSW and will provide clinical information for the team to review at her facility. [facility] will have a decision on Monday. CSW informed MD.  CSW to follow.  Blenda Nicely, [place] Clinical Social Worker [phone]

## 2017-12-27 LAB — GLUCOSE, CAPILLARY
GLUCOSE-CAPILLARY: 103 mg/dL — AB (ref 70–99)
GLUCOSE-CAPILLARY: 142 mg/dL — AB (ref 70–99)
GLUCOSE-CAPILLARY: 85 mg/dL (ref 70–99)
Glucose-Capillary: 96 mg/dL (ref 70–99)
Glucose-Capillary: 98 mg/dL (ref 70–99)

## 2017-12-27 MED ORDER — LABETALOL HCL 200 MG PO TABS
200.0000 mg | ORAL_TABLET | Freq: Three times a day (TID) | ORAL | Status: DC
  Administered 2017-12-27 – 2017-12-31 (×9): 200 mg via ORAL
  Filled 2017-12-27 (×9): qty 1

## 2017-12-27 NOTE — Progress Notes (Signed)
STROKE TEAM - DAILY PROGRESS NOTE   SUBJECTIVE Lying in bed. Trach in place, breathing comfortably.  Secretion much improved.  L hand off restrain, still on mitten.  BP stable, decrease labetalol dose.  DC amantadine.  Still has right hemiplegia and global aphasia.  Continue tube feeding.  SNF placement pending and patient remains difficult to place.  Check CBC and CMP in morning.  OBJECTIVE Most recent Vital Signs: Vitals:   12/27/17 0314 12/27/17 0523 12/27/17 0801 12/27/17 0841  BP: (!) 136/95   (!) 131/103  Pulse: 88 87 88 87  Resp: _0 Temp: 98.6 F (37 C)   98.8 F (37.1 C)  TempSrc: Oral   Oral  SpO2: 99% 98%  100%  Weight:      Height:       CBG (last 3)  Recent Labs    12/26/17 2316 12/27/17 0311 12/27/17 0752  GLUCAP 112* 96 98     CBC Latest Ref Rng & Units 12/22/2017 12/19/2017 12/18/2017  WBC 4.0 - 10.5 K/uL 9.6 11.0(H) 10.8(H)  Hemoglobin 12.0 - 15.0 g/dL 9.3(L) 8.2(L) 8.5(L)  Hematocrit 36.0 - 46.0 % 31.9(L) 28.0(L) 28.2(L)  Platelets 150 - 400 K/uL 385 473(H) 488(H)    BMP Latest Ref Rng & Units 12/22/2017 12/18/2017 12/17/2017  Glucose 70 - 99 mg/dL 136(H) 113(H) 120(H)  BUN 6 - 20 mg/dL _1 Creatinine 0.44 - 1.00 mg/dL 0.81 0.75 0.81  Sodium 135 - 145 mmol/L 139 139 136  Potassium 3.5 - 5.1 mmol/L 3.6 3.9 3.9  Chloride 98 - 111 mmol/L 102 103 100  CO2 22 - 32 mmol/L _2 Calcium 8.9 - 10.3 mg/dL 10.0 9.4 9.5     Physical Exam  HEENT-status post left frontoparietal craniotomy with wound clean dry, soft to touch, L cranial edema Cardiovascular- S1-S2 audible, pulses palpable throughout   Lungs- breath sounds with crackles bilaterally, Status post tracheostomy with trach collar, nonlabored breathing Abdomen- bowel sounds present, RLQ wound CDI musculoskeletal-no joint tenderness, deformity or swelling Skin-warm and dry Neurological Exam:  Awake, alert, globally aphasic, does not  follow commands.  Eyes open, EOMI, PERRL, mild left ptosis. Not blinking to visual threat on the right, but able to trak objects on the right. Right facial droop. Tongue protrusion not cooperative. LUE 5/5 and able to against gravity. LLE 5/5. RUE 1/5 on pain stimuli and RLE 2-/5 to pain. RUE tone mildly increased. B/l babinski becoming muted. Sensation, coordination and gait not otherwise tested.   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 15 mm of left-to-right midline shift.  MRI of the brain: Post craniotomy 8-9 mm of right-to-left midline shift with mild left uncal herniation similar to previous. Underlying evolving large acute ischemic left MCA territory infarct involving the majority of the left MCA territory.  Repeat Head CT post evacuation 9/20 - Left MCA territory infarct with evacuated hematoma. Midline shift measures 8 mm.   UDS - positive for amphetamines ( Pt takes Adderal at home)  2D Echocardiogram EF 60 to 65%  LE venous doppler  negative for DVT  Hypercoagulable labs negative except high ESR and CRP  LDL 64  HgbA1c 5.8  VTE: Heparin subq   On ASA 81 for stroke prevention  Therapy recommendations: SNF  Disposition: Pending   Helmet ordered last week for delivery this week. Still not here.   New bed in place on Friday now removed, not sure why  SLP working on Passy Muir trials  Seizure  Episode of stiffness, tongue biting on 12/03/17  Overnight EEG no seizure  Increased keppra from 500 to 1000mg bid  No more seizure since  Seizure  precautions  Hypertension emergency  Stable now at 120s  BP elevated on admission  On norvasc 10, lisinopril 20 bid  Decrease labetalol to 200 3 times daily  Off minoxidil and HCTZ   BP goal normotensive  Anemia  Hb 11.8 on admission  Hemoglobin 7.3-->8.0->8.3->8.5->8.2 -> 9.3  Received one unit PRBC during this admission    Continue to monitor  Iron study iron 36 and ferritin 66  Pt currently on  iron pills   CBC in a.m.  Dysphagia, secondary to stroke  PEG tube placed on 12/09/2017 for tube feeding  On free water  On bolus tube feeds   Other Stroke Risk Factors  ETOH use   Other Active Problems  ADHD - on Adderell at home  Anxiety  Depression  Hypokalemia, resolved   Check labs in AM  Hospital day # 30   Jindong Xu, MD PhD Stroke Neurology 12/27/2017 2:55 PM       To contact Stroke Continuity provider, please refer to [email or web address]. After hours, contact General Neurology  

## 2017-12-28 LAB — COMPREHENSIVE METABOLIC PANEL
ALK PHOS: 99 U/L (ref 38–126)
ALT: 23 U/L (ref 0–44)
AST: 19 U/L (ref 15–41)
Albumin: 3.2 g/dL — ABNORMAL LOW (ref 3.5–5.0)
Anion gap: 12 (ref 5–15)
BILIRUBIN TOTAL: 0.6 mg/dL (ref 0.3–1.2)
BUN: 14 mg/dL (ref 6–20)
CALCIUM: 9.8 mg/dL (ref 8.9–10.3)
CO2: 24 mmol/L (ref 22–32)
CREATININE: 0.74 mg/dL (ref 0.44–1.00)
Chloride: 103 mmol/L (ref 98–111)
GFR calc Af Amer: >60 mL/min (ref >60)
Glucose, Bld: 102 mg/dL — ABNORMAL HIGH (ref 70–99)
Potassium: 3.8 mmol/L (ref 3.5–5.1)
Sodium: 139 mmol/L (ref 135–145)
TOTAL PROTEIN: 7.4 g/dL (ref 6.5–8.1)

## 2017-12-28 LAB — CBC
HCT: 33.3 % — ABNORMAL LOW (ref 36.0–46.0)
Hemoglobin: 9.8 g/dL — ABNORMAL LOW (ref 12.0–15.0)
MCH: 24.6 pg — AB (ref 26.0–34.0)
MCHC: 29.4 g/dL — AB (ref 30.0–36.0)
MCV: 83.5 fL (ref 80.0–100.0)
NRBC: 0 % (ref 0.0–0.2)
PLATELETS: 273 10*3/uL (ref 150–400)
RBC: 3.99 MIL/uL (ref 3.87–5.11)
RDW: 21.8 % — AB (ref 11.5–15.5)
WBC: 7.9 10*3/uL (ref 4.0–10.5)

## 2017-12-28 LAB — GLUCOSE, CAPILLARY
GLUCOSE-CAPILLARY: 121 mg/dL — AB (ref 70–99)
GLUCOSE-CAPILLARY: 125 mg/dL — AB (ref 70–99)
GLUCOSE-CAPILLARY: 135 mg/dL — AB (ref 70–99)
Glucose-Capillary: 108 mg/dL — ABNORMAL HIGH (ref 70–99)
Glucose-Capillary: 109 mg/dL — ABNORMAL HIGH (ref 70–99)
Glucose-Capillary: 117 mg/dL — ABNORMAL HIGH (ref 70–99)
Glucose-Capillary: 91 mg/dL (ref 70–99)

## 2017-12-28 NOTE — Progress Notes (Signed)
STROKE TEAM - DAILY PROGRESS NOTE   SUBJECTIVE Lying in bed. Trach in place, breathing comfortably.  Secretion mild amount.  BP stable with decreased labetalol dose.  CBC and CMP stable.  OBJECTIVE Most recent Vital Signs: Vitals:   12/28/17 0424 12/28/17 0500 12/28/17 0721 12/28/17 0925  BP: (!) 151/92   135/88  Pulse: 91 90 88 87  Resp: 19 20 20 20   Temp: 98.7 F (37.1 C)   98.8 F (37.1 C)  TempSrc: Oral   Oral  SpO2: 98% 99% 98% 100%  Weight:      Height:       CBG (last 3)  Recent Labs    12/28/17 0001 12/28/17 0421 12/28/17 0810  GLUCAP 135* 91 108*     CBC Latest Ref Rng & Units 12/28/2017 12/22/2017 12/19/2017  WBC 4.0 - 10.5 K/uL 7.9 9.6 11.0(H)  Hemoglobin 12.0 - 15.0 g/dL 9.8(L) 9.3(L) 8.2(L)  Hematocrit 36.0 - 46.0 % 33.3(L) 31.9(L) 28.0(L)  Platelets 150 - 400 K/uL 273 385 473(H)    BMP Latest Ref Rng & Units 12/28/2017 12/22/2017 12/18/2017  Glucose 70 - 99 mg/dL 102(H) 136(H) 113(H)  BUN 6 - 20 mg/dL 14 13 17   Creatinine 0.44 - 1.00 mg/dL 0.74 0.81 0.75  Sodium 135 - 145 mmol/L 139 139 139  Potassium 3.5 - 5.1 mmol/L 3.8 3.6 3.9  Chloride 98 - 111 mmol/L 103 102 103  CO2 22 - 32 mmol/L 24 25 25   Calcium 8.9 - 10.3 mg/dL 9.8 10.0 9.4     Physical Exam  HEENT-status post left frontoparietal craniotomy with wound clean dry, soft to touch, L cranial edema Cardiovascular- S1-S2 audible, pulses palpable throughout   Lungs- breath sounds with crackles bilaterally, Status post tracheostomy with trach collar, nonlabored breathing Abdomen- bowel sounds present, RLQ wound CDI musculoskeletal-no joint tenderness, deformity or swelling Skin-warm and dry Neurological Exam:  Awake, alert, globally aphasic, does not follow commands.  Eyes open, EOMI, PERRL, mild left ptosis. Not blinking to visual threat on the right, but able to trak objects on the right. Right facial droop. Tongue protrusion not cooperative.  LUE 5/5 and able to against gravity. LLE 5/5. RUE 1/5 on pain stimuli and RLE 2-/5 to pain. RUE tone mildly increased. B/l babinski becoming muted. Sensation, coordination and gait not otherwise tested.  Exam not changed from yesterday   ASSESSMENT/PLAN  Stroke:  Acute Large left MCA infarct with large hemorrhagic transformation with uncal herniation s/ p hemicraniotomy for evacuation of hematoma and increased ICP due to left MCA occlusion, embolic pattern, source unclear  CTA head & neck : Positive for left MCA M1 large vessel occlusion, and also severe stenosis at the left ICA terminus  CT of the brain: Large area of hemorrhage in the left basal ganglia and left frontal lobe. 15 mm of left-to-right midline shift.  MRI of the brain: Post craniotomy 8-9 mm of right-to-left midline shift with mild left uncal herniation similar to previous. Underlying evolving large acute ischemic left MCA territory infarct involving the majority of the left MCA territory.  Repeat Head CT post evacuation 9/20 - Left MCA territory infarct with evacuated hematoma. Midline shift measures 8 mm.   UDS - positive for amphetamines ( Pt takes Adderal at home)  2D Echocardiogram EF 60 to 65%  LE venous doppler negative for DVT  Hypercoagulable labs negative except high ESR and CRP  LDL 64  HgbA1c 5.8  VTE: Heparin subq   On ASA 81 for stroke  prevention  Therapy recommendations: SNF  Disposition: Pending   Helmet ordered last week for delivery this week. Still not here.   New bed in place on Friday now removed, not sure why  SLP working on [employer] trials  Seizure  Episode of stiffness, tongue biting on 12/03/17  Overnight EEG no seizure  Increased keppra from 500 to 1053m bid  No more seizure since  Seizure precautions  Hypertension emergency  Stable  BP elevated on admission  On norvasc 10, lisinopril 20 bid  Labetalol now 200 3 times daily  Off minoxidil and HCTZ   BP goal  normotensive  Anemia  Hb 11.8 on admission  Hemoglobin 7.3-->8.0->8.3->8.5->8.2 -> 9.3-> 9.8 gradually improving  Received one unit PRBC during this admission    Continue to monitor  Iron study iron 36 and ferritin 66  Continue  iron pills    Dysphagia, secondary to stroke  PEG tube placed on 12/09/2017 for tube feeding  On free water  On bolus tube feeds  Total protein 7.6 (9/19) -> 7.4 ; Albumin 3.8 -> 3.2   Other Stroke Risk Factors  ETOH use   Other Active Problems  ADHD - on Adderell at home  Anxiety  Depression  Hypokalemia, resolved    Hospital day # 31   JRosalin Hawking MD PhD Stroke Neurology 12/28/2017 11:45 AM    To contact Stroke Continuity provider, please refer to [email or web address] After hours, contact General Neurology

## 2017-12-28 NOTE — Progress Notes (Signed)
Patient pulled trach out, placed back in emergently BBS confirmed, color change confirmed from colorimetric. No distress noted at this time.

## 2017-12-29 DIAGNOSIS — L8990 Pressure ulcer of unspecified site, unspecified stage: Secondary | ICD-10-CM

## 2017-12-29 LAB — GLUCOSE, CAPILLARY
GLUCOSE-CAPILLARY: 98 mg/dL (ref 70–99)
GLUCOSE-CAPILLARY: 95 mg/dL (ref 70–99)
GLUCOSE-CAPILLARY: 131 mg/dL — AB (ref 70–99)
Glucose-Capillary: 108 mg/dL — ABNORMAL HIGH (ref 70–99)
Glucose-Capillary: 140 mg/dL — ABNORMAL HIGH (ref 70–99)

## 2017-12-29 NOTE — Progress Notes (Signed)
  Speech Language Pathology Treatment: Dysphagia;Cognitive-Linquistic;Passy Muir Speaking valve  Patient Details Name: Tammie Miles MRN: [id number] DOB: 03-10-1976 Today's Date: 12/29/2017 Time: 1030-1050 SLP Time Calculation (min) (ACUTE ONLY): 20 min  Assessment / Plan / Recommendation Clinical Impression  Treatment focused on communication and cognitive goals. Patient alert, more interactive with therapist today, spontaneously tracking therapist around the room demonstrating focused attention. PMV placed on uncuffed trach. Patient able to tolerate PMV placement for 15-20 minutes without evidence of CO2 retention/air trapping or distress. Patient remains severely aphasic and likely apraxic with no ability to open mouth for verbal output outside of the context of a task (able to open for self completed oral care and self fed po trials). Did not however today, low intensity humming intermittently. SLP provided verbal and tactile cueing as well as use of song in attempts to facilitate increased phonatory output via sighing, humming, throat clearing without success. Patient did produce audible giggle x 1 in reaction to joke with clinician. Able to self feed po trials following completion of oral care (ice chips) with minimal delayed coughing suggestive of decreased airway protection. Oral phase however appropriate appearing and patient with consistent initiation of swallow. Cognitively, patient demonstrating increased sustained attention (30-45 seconds) during function family tasks, as well as demonstrating emergent awareness and attempts at basic problem solving skills in response to functional task in 10% of opportunities. Valve removed prior to leaving room.   Patient making steady gains overall. Would recommend proceeding with MBS to determine if any pos are safe, even with clinician only, as patient benefits from functional tasks to facilitate awareness, problem solving, and communication. Would also  consider co-treating with PT/OT for the same reason.     HPI HPI: 12 yoF with PMH significant for HTN, anxiety, ADHD, admitted 9/19 with large L hemorrhagic basal ganglia with 15 mm shift with early herniation s/p L frontotemporal crani 9/19 for evacuation of hematoma. ETT 9/19; 9/20 returned to OR for decompressive craniectomy ; 10/1 trach/PEG; transitioned to TC and off vent 10/5. CT 9/24: 1. Left MCA distribution infarction is stable in distribution. Mild increased edema of the infarct with increased herniation of the left frontal lobe via the left frontal craniectomy. 2. Stable 6 mm left-to-right midline shift and partial effacement of left lateral ventricle. 3. Stable small hematoma within the left basal ganglia. CXR 10/9: no acute disease process. Pt trach changed yesterday to Shiley 6 cuffless      SLP Plan  MBS       Recommendations  Diet recommendations: NPO Medication Administration: Via alternative means      Patient may use Passy-Muir Speech Valve: with SLP only         Oral Care Recommendations: Oral care QID Follow up Recommendations: Skilled Nursing facility SLP Visit Diagnosis: Aphasia (R47.01);Dysphagia, unspecified (R13.10) Plan: MBS       Tulip Meharg MA, CCC-SLP        Yuritzy Zehring Meryl 12/29/2017, 10:53 AM

## 2017-12-29 NOTE — Progress Notes (Signed)
Physical Therapy Treatment Patient Details Name: Tammie Miles MRN: [id number] DOB: [birth year] Today's Date: 12/29/2017    History of Present Illness Patient is a 42 y/o female who was admitted for AMS/unresponsiveness. 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 Returned to OR 9/20 for decompressive crani with left skull harvested in abdomen. 9/25 developed seizures. Intubated 9/19-10/7. s/p trach and peg 10/1. PMH includes HTN, depression, anxiety.     PT Comments    Pt performed mobility to edge of bed and able to follow commands with use of LUE/LLE,  Pt used LUE to advance RLE to edge of bed.  Clonus noted in R UE/LE during transfer activities.  Pt continues to benefit from SNF placement for post acute rehab as she will require a lengthier recovery process.    Follow Up Recommendations  SNF     Equipment Recommendations  None recommended by PT    Recommendations for Other Services       Precautions / Restrictions Precautions Precautions: Fall;Other (comment) Precaution Comments: trach collar, PEG, no bone flap L skull Required Braces or Orthoses: Other Brace/Splint Other Brace/Splint: Bilateral PRAFO Boots In bed only please apply ace wrap on top to secure as they are size too big to fit appropriately,  HELMET TO BE WORN OOB AT ALL TIMES, CAN BE OFF IN BED.   Restrictions Weight Bearing Restrictions: No    Mobility  Bed Mobility Overal bed mobility: Needs Assistance Bed Mobility: Supine to Sit     Supine to sit: Mod assist;+2 for safety/equipment     General bed mobility comments: assist to support trunk upright into long sitting initially with pt using LUE to assist; with cues pt initiates moving RLE, requires assist to fully advance RLE and to scoot hips towards EOB  Transfers Overall transfer level: Needs  assistance Equipment used: Ambulation equipment used(SARA STEDY) Transfers: Sit to/from Stand Sit to Stand: Min assist;+2 physical assistance;+2 safety/equipment         General transfer comment:  multimodal cues for hand placement prior to stand and assist to support RUE grasp; assist to rise and steady with pt able to pull of up bar of Stedy using LUE; pt performed sit<>stand from EOB, x2 from seat of Stedy  Ambulation/Gait Ambulation/Gait assistance: (NT)               Stairs             Wheelchair Mobility    Modified Rankin (Stroke Patients Only)       Balance Overall balance assessment: Needs assistance Sitting-balance support: Feet supported;Single extremity supported Sitting balance-Leahy Scale: Poor Sitting balance - Comments: minguard-minA for static sitting balance EOB; at least minA for sitting balance in Stedy to support RUE    Standing balance support: Bilateral upper extremity supported;During functional activity Standing balance-Leahy Scale: Poor Standing balance comment: reliant on UE support/external assist from therapist                            Cognition Arousal/Alertness: Awake/alert Behavior During Therapy: Flat affect Overall Cognitive Status: Difficult to assess Area of Impairment: Following commands;Safety/judgement;Problem solving                       Following Commands: Follows one step commands inconsistently;Follows one step commands with increased time  Safety/Judgement: Decreased awareness of safety   Problem Solving: Decreased initiation;Requires verbal cues;Requires tactile cues General Comments: pt able to follow more 1 step commands and appropriately using L UE/LE during session       Exercises      General Comments        Pertinent Vitals/Pain Pain Assessment: No/denies pain Faces Pain Scale: No hurt Pain Intervention(s): Monitored during session    Home Living                       Prior Function            PT Goals (current goals can now be found in the care plan section) Acute Rehab PT Goals Patient Stated Goal: unable to participate with goals Potential to Achieve Goals: Fair Additional Goals Additional Goal #1: Pt will show purposeful movement to v/t cuing. Progress towards PT goals: Progressing toward goals    Frequency    Min 3X/week      PT Plan Current plan remains appropriate    Co-evaluation PT/OT/SLP Co-Evaluation/Treatment: Yes Reason for Co-Treatment: Complexity of the patient's impairments (multi-system involvement);For patient/therapist safety;To address functional/ADL transfers   OT goals addressed during session: ADL's and self-care;Strengthening/ROM      AM-PAC PT "6 Clicks" Daily Activity  Outcome Measure  Difficulty turning over in bed (including adjusting bedclothes, sheets and blankets)?: Unable Difficulty moving from lying on back to sitting on the side of the bed? : Unable Difficulty sitting down on and standing up from a chair with arms (e.g., wheelchair, bedside commode, etc,.)?: Unable Help needed moving to and from a bed to chair (including a wheelchair)?: A Lot Help needed walking in hospital room?: Total Help needed climbing 3-5 steps with a railing? : Total 6 Click Score: 7    End of Session   Activity Tolerance: Patient tolerated treatment well Patient left: in bed;with call bell/phone within reach;with restraints reapplied;with nursing/sitter in room Nurse Communication: Mobility status;Need for lift equipment(informed nursing patient would benefit from trach suction.  ) PT Visit Diagnosis: Hemiplegia and hemiparesis Hemiplegia - Right/Left: Right Hemiplegia - caused by: Nontraumatic intracerebral hemorrhage     Time: 2956-2130 PT Time Calculation (min) (ACUTE ONLY): 27 min  Charges:  $Therapeutic Activity: 8-22 mins                     Joycelyn Rua, PTA Acute Rehabilitation Services Pager  [phone] Office [phone]     Tammie Miles 12/29/2017, 12:06 PM

## 2017-12-29 NOTE — Progress Notes (Signed)
CSW attempted to f/u with Renette Butters Years @ [phone]. CSW was informed I need to talk with Denzil Magnuson, @ [phone] and she was out the office. CSW  Unable to leave message ( the phone would keep ringing).  Antony Blackbird, [facility] Clinical Social Worker [phone]

## 2017-12-29 NOTE — Progress Notes (Addendum)
Patient continues to remove trach collar, currently on RA and tolerating well with an SpO2 of 100%. No distress noted at this time.

## 2017-12-29 NOTE — Progress Notes (Addendum)
NAME:  Tammie Miles, MRN:  [id number], DOB:  1975-11-29, LOS: 32 ADMISSION DATE:  11/27/2017, CONSULTATION DATE:  11/27/2017 REFERRING MD:  Dr. Conchita Paris CHIEF COMPLAINT:  Left frontal hemorrhage  Brief History   73 yoF admitted 9/19 with AMS that began the evening of 9/18, initially thought due to intoxication. Remained unresponsive through am of 9/19.  Patient was brought to the ED where a CT was done and showed a large left sided ICH. Patient was taken to the OR and a mini craniectomy was done.  Patient was brought out to the ICU and PCCM was consulted for vent management. Course complicated by cerebral edema requiring 3% saline limited by hypernatremia. Also complicated by encephalopathy. Tolerating vent wean, but mental status limited extubation. Then 9/25 she developed what appeared to be seizures with gaze deviation and tongue biting. EEG done overnight with results pending and AEDs adjusted. No further seizure seen.   Significant Hospital Events   9/19 Admit/ OR- mini crani 9/20 Decompressive craniectomy ( L)  with skull harvesting in the abdomen 9/21 hypertonic saline started 9/23 hypertonic stopped.  9/25 Seizure. EEG done with no sz activity. AEDs adjusted. 10/1 Trach / PEG 10/2 Trach collar    Consults: date of consult/date signed off & final recs  9/19 NSGY 9/19 PCCM   Procedures (surgical and bedside):  9/19 Intubated 9/19 OR for crani, evac of hematoma>> no change in neuro status post op 9/20 Decompressive craniectomy ( L)  with skull harvesting in the abdomen  Significant Diagnostic Tests:  9/19 [facility]  Large area of hemorrhage in the left basal ganglia and left frontal lobe, 5.8 x 3.5 cm. 15 mm of left-to-right midline shift. 9/19 CTA head: Positive for left MCA M1 large vessel occlusion, and also severe stenosis at the left ICA terminus, superimposed on the relatively large acute left hemisphere intra-axial hemorrhage (estimated blood volume 43 mL). Vasospasm suspected in  the proximal ACAs. 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 2. Negative for intracranial aneurysm, CTA spot sign, or evidence of vascular malformation in association with the acute hemorrhage. 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 3. Stable intracranial mass effect since 0938 hours today, with 16 mm of leftward midline shift and trapping of the right lateral ventricle. 11/29/2017 CT Head Large left MCA infarct is stable in distribution. Interval left frontal craniectomy with mild herniation of left frontal lobe through the defect. Resolved uncal herniation, improved patency of left lateral ventricle, and 6 mm left-to-right midline shift, previously 8 mm. Partial interval dispersion of acute hemorrhage in the left basal ganglia. Subcentimeter density anterior to the prior position of blood products possibly representing retracted clot or interval hemorrhage, attention at follow-up recommended. CT 9/24: 1. Left MCA distribution infarction is stable in distribution. Mild increased edema of the infarct with increased herniation of the left frontal lobe via the left frontal craniectomy. 2. Stable 6 mm left-to-right midline shift and partial effacement of left lateral ventricle. 3. Stable small hematoma within the left basal ganglia. 4. No new acute intracranial abnormality identified.  Micro Data: 9/19 MRSA PCR >> Negative 9/19 Sputum Culture >> normal flora 9/22 Blood x 2 >> negative 9/22 UA >> Normal 9/22 Sputum >> few candida dubliniensis 12/13/2017 blood cultures x2>> negative 12/13/2017 urine culture>> negative 12/13/2017 sputum culture>> Citrobacter  Antimicrobials:  9/19 Cefazolin (pre-op) 9/20  Subjective:  Peers comfortable  Objective   Blood pressure 133/83, pulse 95, temperature 98.7 F (37.1  C), temperature source Axillary, resp. rate 16, height 5\' 1"  (1.549 m), weight 73.9 kg, SpO2 97 %.    FiO2 (%):  [28 %] 28 %   Intake/Output Summary (Last 24 hours) at 12/29/2017 1317 Last data filed at 12/29/2017 0047 Gross per 24 hour  Intake 360 ml  Output no documentation  Net 360 ml   Filed Weights   12/24/17 1459 12/26/17 2100 12/29/17 0500  Weight: 73.5 kg 74.4 kg 73.9 kg    Intake/Output Summary (Last 24 hours) at 12/29/2017 1317 Last data filed at 12/29/2017 0047 Gross per 24 hour  Intake 360 ml  Output no documentation  Net 360 ml   Examination:  General: Pleasant 42 year old female no acute distress resting in bed HEENT normocephalic atraumatic trickle incision well-healed, size 4 cuffless tracheostomy unremarkable Pulmonary: Clear to auscultation Cardiac: Regular rate and rhythm Abdomen: Soft nontender Extremities moves all extremities no weakness General: Awake, aphasic.  Not able to phonate.  Resolved Hospital Problem list     Assessment & Plan:  42 year old female s/p crani for MCA CVA now with a trach  Acute respiratory insufficiency in the setting of massive IPH - s/p trach placement 10/1. -cont routine trach care -encourage PMV -not candidate for decannulation at this point but may be in future   Dysphagia: - Repeat MBS    Disposition / Summary of Today's Plan 12/29/17   Monitor for bleeding after this AM events    Diet: TF (bolsu) Pain/Anxiety/Delirium protocol: None DVT prophylaxis: SCD's GI prophylaxis: pepcid  Hyperglycemia protocol: None Mobility: BR Code Status: full Family Communication: 9/30 spoke with son, reviewed patients overall state of health.  He is very articulate in his understanding of her illness.    Labs   CBC: Recent Labs  Lab 12/28/17 0435  WBC 7.9  HGB 9.8*  HCT 33.3*  MCV 83.5  PLT 273   Basic Metabolic Panel: Recent Labs  Lab 12/28/17 0435  NA 139  K 3.8  CL 103  CO2 24  GLUCOSE 102*  BUN 14    CREATININE 0.74  CALCIUM 9.8   GFR: Estimated Creatinine Clearance: 84.2 mL/min (by C-G formula based on SCr of 0.74 mg/dL). Recent Labs  Lab 12/28/17 0435  WBC 7.9   Liver Function Tests: Recent Labs  Lab 12/28/17 0435  AST 19  ALT 23  ALKPHOS 99  BILITOT 0.6  PROT 7.4  ALBUMIN 3.2*   No results for input(s): LIPASE, AMYLASE in the last 168 hours. No results for input(s): AMMONIA in the last 168 hours. ABG    Component Value Date/Time   PHART 7.444 12/08/2017 0414   PCO2ART 36.7 12/08/2017 0414   PO2ART 109 (H) 12/08/2017 0414   HCO3 24.6 12/08/2017 0414   TCO2 27 12/05/2017 1216   ACIDBASEDEF 6.0 (H) 11/29/2017 0310   O2SAT 98.2 12/08/2017 0414    Coagulation Profile: No results for input(s): INR, PROTIME in the last 168 hours. Cardiac Enzymes: No results for input(s): CKTOTAL, CKMB, CKMBINDEX, TROPONINI in the last 168 hours. HbA1C: Hgb A1c MFr Bld  Date/Time Value Ref Range Status  11/28/2017 08:32 AM 5.8 (H) 4.8 - 5.6 % Final    Comment:    (NOTE) Pre diabetes:          5.7%-6.4% Diabetes:              >6.4% Glycemic control for   <7.0% adults  with diabetes    CBG: Recent Labs  Lab 12/28/17 2010 12/28/17 2345 12/29/17 0411 12/29/17 0750 12/29/17 1117  GLUCAP 117* 109* 108* 98 140*   Simonne Martinet ACNP-BC [facility] Pulmonary/Critical Care Pager # [phone] OR # [phone] if no answer  Attending Note:  42 year old female s/p CVA that is now aphasic who presents to PCCM with respiratory failure s/p trach placement.  PCCM following for trach care.  On exam, coarse BS with trach in place.  I reviewed CXR myself, trach is in a good position.  Discussed with PCCM-NP.  Respiratory failure due to inability to protect her airway after CVA  - Maintain trach as is  - Maintain on TC as toerated  Hypoxemia:  - Titrate O2 for sat of 88-92%  Trach status:  - No decannulation  Dysphagia:  - Continue working with SLP  PCCM will continue to  follow  Patient seen and examined, agree with above note.  I dictated the care and orders written for this patient under my direction.  Alyson Reedy, MD [phone]

## 2017-12-29 NOTE — Progress Notes (Addendum)
Occupational Therapy Treatment Patient Details Name: Tammie Miles MRN: [id number] DOB: 1975-12-09 Today's Date: 12/29/2017    History of present illness Patient is a 42 y/o female who was admitted for AMS/unresponsiveness. 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 Returned to OR 9/20 for decompressive crani with left skull harvested in abdomen. 9/25 developed seizures. Intubated 9/19-10/7. s/p trach and peg 10/1. PMH includes HTN, depression, anxiety.    OT comments  Pt making steady progress towards OT goals and appears eager to participate with therapy this session. Use of Stedy this session for transfer OOB to chair with two person assist. Pt performed grooming ADLs at sink both in sitting and standing at [facility] with overall modA. Pt with improvements in ability to follow one step commands given increased time and multimodal cues. Pt also demonstrating increase in purposeful movements with LUE during ADL and mobility tasks. Discharge recommendations remain appropriate at this time, though will continue to assess for pt progress. Will continue to follow acutely.    Follow Up Recommendations  SNF;Supervision/Assistance - 24 hour    Equipment Recommendations  Other (comment);Wheelchair (measurements OT);Wheelchair cushion (measurements OT);Hospital bed(TBD in next venue )          Precautions / Restrictions Precautions Precautions: Fall;Other (comment) Precaution Comments: trach collar, PEG, no bone flap L skull Required Braces or Orthoses: Other Brace/Splint Other Brace/Splint: Bilateral PRAFO Boots In bed only please apply ace wrap on top to secure as they are size too big to fit appropriately,  HELMET TO BE WORN OOB AT ALL TIMES, CAN BE OFF IN BED.   Restrictions Weight Bearing Restrictions: No       Mobility Bed  Mobility Overal bed mobility: Needs Assistance Bed Mobility: Supine to Sit     Supine to sit: Mod assist;+2 for safety/equipment     General bed mobility comments: assist to support trunk upright into long sitting initially with pt using LUE to assist; with cues pt initiates moving RLE, requires assist to fully advance RLE and to scoot hips towards EOB  Transfers Overall transfer level: Needs assistance   Transfers: Sit to/from Stand Sit to Stand: Min assist;+2 physical assistance;+2 safety/equipment         General transfer comment:  multimodal cues for hand placement prior to stand and assist to support RUE grasp; assist to rise and steady with pt able to pull of up bar of Stedy using LUE; pt performed sit<>stand from EOB, x2 from seat of Stedy    Balance Overall balance assessment: Needs assistance Sitting-balance support: Feet supported;Single extremity supported Sitting balance-Leahy Scale: Poor Sitting balance - Comments: minguard-minA for static sitting balance EOB; at least minA for sitting balance in Stedy to support RUE    Standing balance support: Bilateral upper extremity supported;During functional activity Standing balance-Leahy Scale: Poor Standing balance comment: reliant on UE support/external assist from therapist                           ADL either performed or assessed with clinical judgement   ADL Overall ADL's : Needs assistance/impaired Eating/Feeding: NPO   Grooming: Minimal assistance;Moderate assistance;Sitting;Wash/dry face;Wash/dry hands Grooming Details (indicate cue type and reason): pt performing grooming ADLs seated and standing at [facility] at sink; requires step by step instruction and cues for thoroughness; pt washed face, hands, and applied lotion  to hands - increased cues required to perform bimanual task of applying lotion to both hands (using LUE over RUE)                               General ADL Comments: pt able  to perform sit<>stand at [facility] with minA+2 this session. Use of Stedy for transfer to chair after completing grooming ADLs seated at sink     Vision   Additional Comments: will continue to assess; given cues pt tracks visually towards therapist positioned to the L              Cognition Arousal/Alertness: Awake/alert Behavior During Therapy: Flat affect;Restless;Impulsive Overall Cognitive Status: Difficult to assess Area of Impairment: Following commands;Safety/judgement;Problem solving                       Following Commands: Follows one step commands with increased time Safety/Judgement: Decreased awareness of safety   Problem Solving: Decreased initiation;Requires verbal cues;Requires tactile cues General Comments: pt with increased ability to follow one step commands this session, following fairly consistently given increased time and intermittent multimodal cues; pt slightly impulsive as she is eager to get up with therapy this session                           Pertinent Vitals/ Pain       Pain Assessment: No/denies pain Faces Pain Scale: No hurt Pain Intervention(s): Monitored during session                                            Prior Functioning/Environment              Frequency  Min 2X/week        Progress Toward Goals  OT Goals(current goals can now be found in the care plan section)     Acute Rehab OT Goals Patient Stated Goal: unable to participate with goals OT Goal Formulation: Patient unable to participate in goal setting Time For Goal Achievement: 12/30/17 Potential to Achieve Goals: Good ADL Goals Pt Will Perform Grooming: with max assist;bed level Additional ADL Goal #1: Pt will maintain midline postural position at EOB with max assist in prep for ADL. Additional ADL Goal #2: Pt will demonstrate purposeful movement in L UE non-distracting enviorment during 2/3 trials.  Plan Discharge plan needs to  be updated    Co-evaluation    PT/OT/SLP Co-Evaluation/Treatment: Yes Reason for Co-Treatment: Complexity of the patient's impairments (multi-system involvement);For patient/therapist safety;To address functional/ADL transfers   OT goals addressed during session: ADL's and self-care;Strengthening/ROM      AM-PAC PT "6 Clicks" Daily Activity     Outcome Measure   Help from another person eating meals?: Total Help from another person taking care of personal grooming?: A Lot Help from another person toileting, which includes using toliet, bedpan, or urinal?: A Lot Help from another person bathing (including washing, rinsing, drying)?: A Lot Help from another person to put on and taking off regular upper body clothing?: A Lot Help from another person to put on and taking off regular lower body clothing?: A Lot 6 Click Score: 11    End of Session Equipment Utilized During Treatment: (Stedy; helmet)  OT Visit Diagnosis: Other abnormalities of gait and mobility (R26.89);Muscle  weakness (generalized) (M62.81);Low vision, both eyes (H54.2);Other symptoms and signs involving cognitive function;Hemiplegia and hemiparesis Hemiplegia - Right/Left: Right 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

## 2017-12-29 NOTE — Progress Notes (Addendum)
STROKE TEAM - DAILY PROGRESS NOTE   SUBJECTIVE Up in steady today with therapy. Able to stand, hold on. Is following commands. They put her up in the chair with her helmet on, which she removed herself twice. She pulled out her trach as well during the night, maintaining sats per RN. SLP working with [place].   OBJECTIVE Most recent Vital Signs: Vitals:   12/29/17 0337 12/29/17 0414 12/29/17 0500 12/29/17 0752  BP:  108/88  133/83  Pulse: 94 87  91  Resp: 19   18  Temp:  99.1 F (37.3 C)  98.7 F (37.1 C)  TempSrc:  Oral  Axillary  SpO2: 98% 99%  96%  Weight:   73.9 kg   Height:       CBG (last 3)  Recent Labs    12/28/17 2345 12/29/17 0411 12/29/17 0750  GLUCAP 109* 108* 98     CBC Latest Ref Rng & Units 12/28/2017 12/22/2017 12/19/2017  WBC 4.0 - 10.5 K/uL 7.9 9.6 11.0(H)  Hemoglobin 12.0 - 15.0 g/dL 9.8(L) 9.3(L) 8.2(L)  Hematocrit 36.0 - 46.0 % 33.3(L) 31.9(L) 28.0(L)  Platelets 150 - 400 K/uL 273 385 473(H)    BMP Latest Ref Rng & Units 12/28/2017 12/22/2017 12/18/2017  Glucose 70 - 99 mg/dL 102(H) 136(H) 113(H)  BUN 6 - 20 mg/dL 14 13 17   Creatinine 0.44 - 1.00 mg/dL 0.74 0.81 0.75  Sodium 135 - 145 mmol/L 139 139 139  Potassium 3.5 - 5.1 mmol/L 3.8 3.6 3.9  Chloride 98 - 111 mmol/L 103 102 103  CO2 22 - 32 mmol/L 24 25 25   Calcium 8.9 - 10.3 mg/dL 9.8 10.0 9.4     Physical Exam  HEENT-status post left frontoparietal craniotomy with wound clean dry, soft to touch, L cranial edema decreasubg Cardiovascular- S1-S2 audible, pulses palpable throughout   Lungs- breath sounds clear. Status post tracheostomy with trach collar, nonlabored breathing. Spontaneously cough w/ clear sputum Abdomen- bowel sounds present, RLQ wound CDI musculoskeletal-no joint tenderness, deformity or swelling Skin-warm and dry  Neurological Exam:  Awake, alert, no speech output. Will hold/release hand to command x 3. Will not show fingers.  Will hold steady and stand to command.  Eyes open, EOMI, PERRL, mild left ptosis. Not blinking to visual threat on the right, but able to trak objects on the right. Right facial droop. Tongue protrusion not cooperative. LUE 5/5 and able to against gravity. LLE 5/5. RUE 2/5 on pain stimuli and RLE 2-/5 to pain. RUE tone mildly increased. B/l babinski becoming muted. Sensation, coordination and gait not otherwise tested.   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 15 mm of left-to-right midline shift.  MRI of the brain: Post craniotomy 8-9 mm of right-to-left midline shift with mild left uncal herniation similar to previous. Underlying evolving large acute ischemic left MCA territory infarct involving the majority of the left MCA territory.  Repeat Head CT post evacuation 9/20 - Left MCA territory infarct with evacuated hematoma. Midline shift measures 8 mm.   UDS - positive for amphetamines ( Pt takes Adderal at home)  2D Echocardiogram EF 60 to 65%  LE venous doppler negative for DVT  Hypercoagulable labs negative except high ESR and CRP  LDL 64  HgbA1c 5.8  VTE: Heparin subq   On ASA 81 for stroke prevention  Therapy recommendations: SNF  Disposition: Pending   Helmet at bedside. To wear when OOB or at risk for fall  Weight bearing bed in place 2 weeks ago, replaced by RN during request low bed during over the weekend - discussed with PT, no need for wt bearing bed as pt up in a steady  Acute respiratory insufficiency in the setting of massive IPH s/p trach  Status post  tracheostomy on 12/09/17  Doing well on trach collar  Continues to pulled out trach during the night, replaced with #4 shiley/cuffless. Did not experience respiratory distress during episode  PMV underway w/ SLP  Per CCM, not a candidate for decannulation at the point but may be in the future  Seizure  Episode of stiffness, tongue biting on 12/03/17  Overnight EEG no seizure  Increased keppra from 500 to 1062m bid  No more seizure since  Seizure precautions  Hypertension emergency  Stable  BP elevated on admission  On norvasc 10, lisinopril 20 bid  Labetalol now 200 3 times daily  Off minoxidil and HCTZ   BP 110-130s  BP goal normotensive  Anemia  Hb 11.8 on admission  Hemoglobin 7.3-->8.0->8.3->8.5->8.2 -> 9.3-> 9.8 gradually improving  Received one unit PRBC during this admission    Continue to monitor  Iron study iron 36 and ferritin 66  Continue  iron pills   Dysphagia, secondary to stroke  PEG tube placed on 12/09/2017 for tube feeding  On free water  On bolus tube feeds  Total protein 7.6 (9/19) -> 7.4 ; Albumin 3.8 -> 3.2  Plan MBSS   Other Stroke Risk Factors  ETOH use   Other Active Problems  ADHD - on Adderell at home  Anxiety  Depression  Hypokalemia, resolved   Hospital day # [place] MSN, APRN, ANVP-BC, AGPCNP-BC Advanced Practice Stroke Nurse CGrenelefefor Schedule & Pager information 12/29/2017 2:18 PM   ATTENDING NOTE: I reviewed above note and agree with the assessment and plan. Pt was seen and examined.  No familyat bedside. Patient lying in bed not in distress, minimum secretion.  Able to work with PT/OT and standing up with walker, however, still global aphasia and right hemiplegia on exam.  Social workerisstill working on SNF placement.  JRosalin Hawking MD PhD Stroke Neurology 12/29/2017 5:44 PM   To contact Stroke Continuity provider, please refer to  [email or web address] After hours, contact General Neurology

## 2017-12-30 ENCOUNTER — Inpatient Hospital Stay (HOSPITAL_COMMUNITY): Payer: Medicaid Other

## 2017-12-30 LAB — GLUCOSE, CAPILLARY
GLUCOSE-CAPILLARY: 126 mg/dL — AB (ref 70–99)
Glucose-Capillary: 103 mg/dL — ABNORMAL HIGH (ref 70–99)
Glucose-Capillary: 122 mg/dL — ABNORMAL HIGH (ref 70–99)
Glucose-Capillary: 97 mg/dL (ref 70–99)
Glucose-Capillary: 99 mg/dL (ref 70–99)

## 2017-12-30 NOTE — Progress Notes (Signed)
Modified Barium Swallow Progress Note  Patient Details  Name: Tammie Miles MRN: [id number] Date of Birth: [birth year]  Today's Date: 12/30/2017  Modified Barium Swallow completed.  Full report located under Chart Review in the Imaging Section.  Brief recommendations include the following:  Clinical Impression  Pt exhibited moderate oral and mild pharyngeal dysphagia during today's objective swallow study with PMV in place. Oral impairments were marked primarily by anterior loss, oral holding, and intermittent premature spillage of liquids to the valleculae and pyriform sinuses. Pharyngeal impairment was evidenced by transient delayed swallow initiation with barium frequently reaching pyriform sinuses before swallow mechanisms activated. One instance of flash penetration into laryngeal vestibule observed with thin from straw, however airway protection remained intact for all other trials of thin, nectar, puree, and regular textures. Given pt's significant apraxia, reduced comprehension and current deconditioning recommend pt remain  NPO today/tonight with ST trials of thin/solids to determine endurance of greater volumes over extended period of time. Pt likely able to initiate diet of mechanical soft or regular and thin liquids following ST session.     Swallow Evaluation Recommendations       SLP Diet Recommendations: NPO;Other (Comment)(trials with ST) Trials with ST and likely initiation of po's tomorrow if appropriate.                        Oral Care Recommendations: Oral care QID        Royce Macadamia 12/30/2017,3:50 PM    Breck Coons [place].Ed Nurse, children's [phone] Office [phone]

## 2017-12-30 NOTE — Progress Notes (Addendum)
STROKE TEAM - DAILY PROGRESS NOTE   SUBJECTIVE Lying in bed. Continues to pull out trach with reinsertion. No respiratory distress during. Continues to follow simple L arm commands. SNF placement bed search continues.  OBJECTIVE Most recent Vital Signs: Vitals:   12/30/17 0418 12/30/17 0432 12/30/17 0538 12/30/17 0740  BP: (!) 146/90   138/90  Pulse: 98 99 99 89  Resp: 18 18  17   Temp: 98.5 F (36.9 C)   98.7 F (37.1 C)  TempSrc: Oral   Axillary  SpO2: 98% 96%  97%  Weight:      Height:       CBG (last 3)  Recent Labs    12/29/17 2343 12/30/17 0422 12/30/17 0739  GLUCAP 122* 103* 99     CBC Latest Ref Rng & Units 12/28/2017 12/22/2017 12/19/2017  WBC 4.0 - 10.5 K/uL 7.9 9.6 11.0(H)  Hemoglobin 12.0 - 15.0 g/dL 9.8(L) 9.3(L) 8.2(L)  Hematocrit 36.0 - 46.0 % 33.3(L) 31.9(L) 28.0(L)  Platelets 150 - 400 K/uL 273 385 473(H)    BMP Latest Ref Rng & Units 12/28/2017 12/22/2017 12/18/2017  Glucose 70 - 99 mg/dL 102(H) 136(H) 113(H)  BUN 6 - 20 mg/dL 14 13 17   Creatinine 0.44 - 1.00 mg/dL 0.74 0.81 0.75  Sodium 135 - 145 mmol/L 139 139 139  Potassium 3.5 - 5.1 mmol/L 3.8 3.6 3.9  Chloride 98 - 111 mmol/L 103 102 103  CO2 22 - 32 mmol/L 24 25 25   Calcium 8.9 - 10.3 mg/dL 9.8 10.0 9.4     Physical Exam  HEENT-status post left frontoparietal craniotomy with wound clean dry, soft to touch, L cranial edema decreasing Cardiovascular- S1-S2 audible, pulses palpable throughout   Lungs- breath sounds clear. Status post tracheostomy with trach collar, nonlabored breathing. Spontaneously cough w/ clear sputum Abdomen- bowel sounds present, RLQ wound CDI musculoskeletal-no joint tenderness, deformity or swelling Skin-warm and dry  Neurological Exam:  Awake, alert, no speech output. More socially interactive. Will raise L arm to command, hold/release examiner's hand to command x 2. Will not show fingers. Attempts to smile. Eyes  open, EOMI, PERRL, mild left ptosis. Not blinking to visual threat on the right, but able to track objects on the right. Right facial droop. Tongue protrusion not cooperative. Attempts to smile. LUE 5/5 and able to hold against gravity. LLE 5/5. RUE 2/5 on pain stimuli and RLE 2+/5 to pain. R sided tone. B/l babinski muted. Sensation, coordination and gait not otherwise tested.   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 15 mm of left-to-right midline shift.  MRI of the brain: Post craniotomy 8-9 mm of right-to-left midline shift with mild left uncal herniation similar to previous. Underlying evolving large acute ischemic left MCA territory infarct involving the majority of the left MCA territory.  Repeat Head CT post evacuation 9/20 - Left MCA territory infarct with evacuated hematoma. Midline shift measures 8 mm.   UDS - positive for amphetamines ( Pt takes Adderal at home)  2D Echocardiogram EF 60 to 65%  LE venous doppler negative for DVT  Hypercoagulable labs negative except  high ESR and CRP  LDL 64  HgbA1c 5.8  VTE: Heparin subq   On ASA 81 for stroke prevention  Therapy recommendations: SNF  Disposition: Pending   Helmet at bedside. To wear when OOB or at risk for fall  Acute respiratory insufficiency in the setting of massive IPH s/p trach  Status post tracheostomy on 12/09/17  Doing well on trach collar  Continues to pull out trach, replaced with #4 shiley/cuffless. Did not experience respiratory distress during episode  Hands restrained  PMV underway w/ SLP  Per  CCM, not a candidate for decannulation at the point but may be in the future  Seizure  Episode of stiffness, tongue biting on 12/03/17  Overnight EEG no seizure  Increased keppra from 500 to 1071m bid  No more seizure since  Seizure precautions  Hypertension emergency  Stable  BP elevated on admission  On norvasc 10, lisinopril 20 bid  Labetalol now 200 3 times daily  Off minoxidil and HCTZ   BP 110-130s  BP goal normotensive  Anemia  Hb 11.8 on admission  Hemoglobin 7.3-->8.0->8.3->8.5->8.2 -> 9.3-> 9.8 gradually improving  Received one unit PRBC during this admission    Continue to monitor  Iron study iron 36 and ferritin 66  Continue  iron pills   Dysphagia, secondary to stroke  PEG tube placed on 12/09/2017 for tube feeding  On free water  On bolus tube feeds  Total protein 7.6 (9/19) -> 7.4 ; Albumin 3.8 -> 3.2  MBSS 12/30/17 - NPO, trials w/ SLP and likely po initiation tomorrow   Other Stroke Risk Factors  ETOH use   Other Active Problems  ADHD - on Adderell at home  Anxiety  Depression  Hypokalemia, resolved   Hospital day # 3[place] MSN, APRN, ANVP-BC, AGPCNP-BC Advanced Practice Stroke Nurse CLowesfor Schedule & Pager information 12/30/2017 8:21 AM   ATTENDING NOTE: I reviewed above note and agree with the assessment and plan. Pt was seen and examined.  No familyat bedside. No acute event overnight.Patient lying in bed not in distress, minimum secretion. BP stable. Still global aphasia and right hemiplegia on exam.  on left wrist restrain and left hand mitten to avoid pulling off trach. Social workerisstill working on SNF placement.  JRosalin Hawking MD PhD Stroke Neurology 12/30/2017 6:49 PM    To contact Stroke Continuity provider, please refer to [email or web address] After hours, contact General Neurology

## 2017-12-30 NOTE — Progress Notes (Signed)
Patient was not in restraints at shift change or since I have been this patients nurse.  Patient does have on bilateral mittens for trach safety.

## 2017-12-30 NOTE — Progress Notes (Signed)
  Speech Language Pathology  Patient Details Name: ROMAINE MACIOLEK MRN: [id number] DOB: 08/21/1975 Today's Date: 12/30/2017 Time:  -     MBS scheduled today for 1:30                     GO                Royce Macadamia 12/30/2017, 11:41 AM  Breck Coons [place].Ed Nurse, children's [phone] Office [phone]

## 2017-12-30 NOTE — Progress Notes (Signed)
Walked out of room 6 and Joni Reining, tech asked if I could help change the patient because the patient had BM all over her, while we were getting prepared to clean patient, we turned around and patient had pulled her entire trach out, respiratory called, monitored O2 sats while we were cleaning patient, respiratory in room before we were done cleaning patient, patients O2 remained between 98 and 100

## 2017-12-30 NOTE — Progress Notes (Signed)
Physical Therapy Treatment Patient Details Name: Tammie Miles MRN: [id number] DOB: 03-22-1975 Today's Date: 12/30/2017    History of Present Illness Patient is a 42 y/o female who was admitted for AMS/unresponsiveness. 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 Returned to OR 9/20 for decompressive crani with left skull harvested in abdomen. 9/25 developed seizures. Intubated 9/19-10/7. s/p trach and peg 10/1. PMH includes HTN, depression, anxiety.     PT Comments    Has made noticeable improvement over the last 2-3 weeks.  Pt is impulsive at times, appears anticipatory at other times.  Pt was alert throughout, assisted with rolling and transition to sit.  Showed able to sit EOB with min guard assist.  Initiated standing into the STEDY and the RW.  Pt able to maintain stance with full extension L LE and -20 extension R knee.  Progressed to pre gait and maximally assisted ambulation for 5 feet.    Follow Up Recommendations  SNF     Equipment Recommendations  None recommended by PT    Recommendations for Other Services       Precautions / Restrictions Precautions Precautions: Fall Precaution Comments: trach collar, PEG, no bone flap L skull Required Braces or Orthoses: Other Brace/Splint Other Brace/Splint: Bilateral PRAFO Boots In bed only please apply ace wrap on top to secure as they are size too big to fit appropriately,  HELMET TO BE WORN OOB AT ALL TIMES, CAN BE OFF IN BED.   Restrictions Weight Bearing Restrictions: No    Mobility  Bed Mobility Overal bed mobility: Needs Assistance Bed Mobility: Rolling;Sidelying to Sit Rolling: Mod assist Sidelying to sit: Mod assist       General bed mobility comments: cues for sequencing and truncal assist  Transfers Overall transfer level: Needs assistance Equipment used:  Rolling walker (2 wheeled);Ambulation equipment used Transfers: Sit to/from Stand Sit to Stand: Min assist;+2 physical assistance;+2 safety/equipment         General transfer comment: cues for hand placement, assist to come forward and to keep Right UE in place.  Ambulation/Gait Ambulation/Gait assistance: Mod assist;Max assist;+2 physical assistance;+2 safety/equipment(max to advance R LE) Gait Distance (Feet): 6 Feet Assistive device: Rolling walker (2 wheeled) Gait Pattern/deviations: Step-to pattern;Decreased stance time - right     General Gait Details: Hemiparetic gait.  R LE needed full assist to advance and assisted with w/shift.   Stairs             Wheelchair Mobility    Modified Rankin (Stroke Patients Only) Modified Rankin (Stroke Patients Only) Pre-Morbid Rankin Score: No symptoms Modified Rankin: Severe disability     Balance Overall balance assessment: Needs assistance   Sitting balance-Leahy Scale: Fair(to poor) Sitting balance - Comments: min guard sitting EOB without any further challenge     Standing balance-Leahy Scale: Poor Standing balance comment: reliant on UE support/external assist from therapist stood >1 min each in stedy x2 and RW x1, completing w/shifting before stepping.                            Cognition Arousal/Alertness: Awake/alert Behavior During Therapy: Flat affect Overall Cognitive Status: Difficult to assess Area of Impairment: Following commands;Safety/judgement;Problem solving                       Following  Commands: Follows one step commands with increased time Safety/Judgement: Decreased awareness of safety   Problem Solving: Slow processing;Difficulty sequencing;Requires verbal cues;Requires tactile cues        Exercises Other Exercises Other Exercises: bilateral hip/knee flexion/ext with graded resistance.    General Comments        Pertinent Vitals/Pain Pain Assessment:  Faces Faces Pain Scale: No hurt    Home Living                      Prior Function            PT Goals (current goals can now be found in the care plan section) Acute Rehab PT Goals Patient Stated Goal: unable to participate with goals PT Goal Formulation: Patient unable to participate in goal setting Time For Goal Achievement: 01/13/18 Potential to Achieve Goals: Fair Progress towards PT goals: Progressing toward goals;Goals met and updated - see care plan    Frequency    Min 3X/week      PT Plan Current plan remains appropriate    Co-evaluation              AM-PAC PT "6 Clicks" Daily Activity  Outcome Measure  Difficulty turning over in bed (including adjusting bedclothes, sheets and blankets)?: Unable Difficulty moving from lying on back to sitting on the side of the bed? : Unable Difficulty sitting down on and standing up from a chair with arms (e.g., wheelchair, bedside commode, etc,.)?: Unable Help needed moving to and from a bed to chair (including a wheelchair)?: A Little Help needed walking in hospital room?: A Lot Help needed climbing 3-5 steps with a railing? : Total 6 Click Score: 9    End of Session   Activity Tolerance: Patient tolerated treatment well Patient left: in chair;with call bell/phone within reach;with chair alarm set Nurse Communication: Mobility status PT Visit Diagnosis: Other abnormalities of gait and mobility (R26.89);Muscle weakness (generalized) (M62.81);Hemiplegia and hemiparesis Hemiplegia - Right/Left: Right Hemiplegia - caused by: Nontraumatic intracerebral hemorrhage     Time: 1545-1616 PT Time Calculation (min) (ACUTE ONLY): 31 min  Charges:  $Therapeutic Activity: 23-37 mins                     12/30/2017  Donnella Sham, PT Acute Rehabilitation Services [phone]  (pager) [phone]  (office)   Tessie Fass Epiphany Seltzer 12/30/2017, 4:32 PM

## 2017-12-30 NOTE — Progress Notes (Signed)
Pt pulled out trach.  Respiratory called.  Placed another 4shiley cuffless in pt.  Pt coughing suctioned small amt yellow sec.  Checked placement with CO2 detector.

## 2017-12-30 NOTE — Progress Notes (Signed)
Nutrition Follow-up  DOCUMENTATION CODES:   Obesity unspecified  INTERVENTION:  Recommend continuation of current tube feeding regimen.  If diet advanced tomorrow,  Recommend letting pt to attempt to eat at meals, if po intake at that meal is <50% then administer a bolus tube feeding using Jevity 1.2 formula via PEG at volume of 360 ml.  Additionally continue providing HS bolus feed.   Continue free water flushes of 140 ml QID per tube.  Full bolus tube feeds to provide 1728 kcal, 80 grams of protein, and 1726 ml water.  NUTRITION DIAGNOSIS:   Inadequate oral intake related to inability to eat as evidenced by NPO status; ongoing  GOAL:   Patient will meet greater than or equal to 90% of their needs; met with TF  MONITOR:   TF tolerance, Labs, Skin, Weight trends, I & O's  REASON FOR ASSESSMENT:   Consult Enteral/tube feeding initiation and management  ASSESSMENT:   Pt with PMH of HTN with prescribed stimulant use admitted with large L hemorrhagic basal ganglia with 15 mm shift with early herniation s/p L frontotemporal craniotomy for evacuation of hematoma.  9/19 Crani evacuation. 9/20 Decompressive : craniectomy. 10/1 trach/PEG placed.   Pt underwent MBS today. Per SLP, recommendations given or pt to continue NPO today and start po trials with SLP tomorrow with likely initiation of mechanical soft or regular with thin liquids following SLP trial session. Recommend continuation of current tube feeding regimen. Once diet advances, then recommend providing a bolus tube feeds if po intake at a meal is <50% with continuation of HS bolus feed daily to provide adequate nutrition. RD to continue to monitor.   Diet Order:   Diet Order    None      EDUCATION NEEDS:   No education needs have been identified at this time  Skin:  Skin Assessment: Skin Integrity Issues: Skin Integrity Issues:: Stage II, Incisions Stage II: coccyx Incisions: L head, abdomen  Last BM:   10/20  Height:   Ht Readings from Last 1 Encounters:  12/01/17 5' 1"  (1.549 m)    Weight:   Wt Readings from Last 1 Encounters:  12/30/17 67.6 kg    Ideal Body Weight:  47.7 kg  BMI:  Body mass index is 28.15 kg/m.  Estimated Nutritional Needs:   Kcal:  1600-1800  Protein:  80-100 grams  Fluid:  > 1.6 L/day    Corrin Parker, MS, RD, LDN Pager # [phone] After hours/ weekend pager # [phone]

## 2017-12-31 LAB — GLUCOSE, CAPILLARY
GLUCOSE-CAPILLARY: 159 mg/dL — AB (ref 70–99)
Glucose-Capillary: 89 mg/dL (ref 70–99)

## 2017-12-31 MED ORDER — LABETALOL HCL 200 MG PO TABS
300.0000 mg | ORAL_TABLET | Freq: Three times a day (TID) | ORAL | Status: DC
  Administered 2017-12-31 – 2018-01-08 (×25): 300 mg via ORAL
  Filled 2017-12-31 (×25): qty 1

## 2017-12-31 NOTE — Progress Notes (Signed)
Patient found on floor at 1800, patient in low bed, no apparent injuries to patient, no pain. MD notified

## 2017-12-31 NOTE — Progress Notes (Addendum)
STROKE TEAM - DAILY PROGRESS NOTE   SUBJECTIVE Lying in bed. Position low. Walked 5 ft with therapist today. In bilateral mitts.  OBJECTIVE Most recent Vital Signs: Vitals:   12/31/17 0353 12/31/17 0406 12/31/17 0707 12/31/17 0802  BP:    (!) 144/98  Pulse:   91 89  Resp:   18 17  Temp:    99.3 F (37.4 C)  TempSrc:    Oral  SpO2: 96%  94% 98%  Weight:  71.7 kg    Height:       CBG (last 3)  Recent Labs    12/30/17 0739 12/30/17 1151 12/30/17 1618  GLUCAP 99 97 126*     CBC Latest Ref Rng & Units 12/28/2017 12/22/2017 12/19/2017  WBC 4.0 - 10.5 K/uL 7.9 9.6 11.0(H)  Hemoglobin 12.0 - 15.0 g/dL 9.8(L) 9.3(L) 8.2(L)  Hematocrit 36.0 - 46.0 % 33.3(L) 31.9(L) 28.0(L)  Platelets 150 - 400 K/uL 273 385 473(H)    BMP Latest Ref Rng & Units 12/28/2017 12/22/2017 12/18/2017  Glucose 70 - 99 mg/dL 102(H) 136(H) 113(H)  BUN 6 - 20 mg/dL 14 13 17   Creatinine 0.44 - 1.00 mg/dL 0.74 0.81 0.75  Sodium 135 - 145 mmol/L 139 139 139  Potassium 3.5 - 5.1 mmol/L 3.8 3.6 3.9  Chloride 98 - 111 mmol/L 103 102 103  CO2 22 - 32 mmol/L 24 25 25   Calcium 8.9 - 10.3 mg/dL 9.8 10.0 9.4     Physical Exam no change in exam HEENT-status post left frontoparietal craniotomy with wound clean dry, soft to touch, L cranial edema decreasing Cardiovascular- S1-S2 audible, pulses palpable throughout   Lungs- breath sounds clear. Status post tracheostomy with trach collar, nonlabored breathing. Spontaneously cough w/ clear sputum Abdomen- bowel sounds present, RLQ wound CDI musculoskeletal-no joint tenderness, deformity or swelling Skin-warm and dry  Neurological Exam:  Awake, alert, no speech output. More socially interactive. Will raise L arm to command, hold/release examiner's hand to command x 2. Will not show fingers. Attempts to smile. Eyes open, EOMI, PERRL, mild left ptosis. Not blinking to visual threat on the right, but able to track objects  on the right. Right facial droop. Tongue protrusion not cooperative. Attempts to smile. LUE 5/5 and able to hold against gravity. LLE 5/5. RUE 2/5 on pain stimuli and RLE 2+/5 to pain. R sided tone. B/l babinski muted. Sensation, coordination and gait not otherwise tested.   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 15 mm of left-to-right midline shift.  MRI of the brain: Post craniotomy 8-9 mm of right-to-left midline shift with mild left uncal herniation similar to previous. Underlying evolving large acute ischemic left MCA territory infarct involving the majority of the left MCA territory.  Repeat Head CT post evacuation 9/20 - Left MCA territory infarct with evacuated hematoma. Midline shift measures 8 mm.   UDS - positive for amphetamines ( Pt takes Adderal at home)  2D Echocardiogram EF 60 to 65%  LE venous doppler negative for DVT  Hypercoagulable labs negative except high ESR and CRP  LDL 64  HgbA1c 5.8  VTE: Heparin subq   On ASA 81 for stroke prevention  Therapy recommendations: SNF  Disposition: Pending   Helmet at bedside. To wear when OOB or at risk for fall  Acute respiratory insufficiency in the setting of massive IPH s/p trach  Status post tracheostomy on 12/09/17  Doing well on trach collar  Repetitively pulls/attempts to pull out trach, mitts on  Has #4 shiley/cuffless.   PMV underway w/ SLP  Per CCM, not a candidate for decannulation at the point but may be in the future  Seizure  Episode of stiffness, tongue biting on 12/03/17  Overnight EEG no  seizure  Increased keppra from 500 to 1022m bid  No more seizure since  Seizure precautions  Hypertension emergency  Stable  BP elevated on admission  On norvasc 10, lisinopril 20 bid  Labetalol now 200 3 times daily  Off minoxidil and HCTZ   BP 140-160 today  Increase labetalol back to 300 tid.   BP goal normotensive  Anemia  Hb 11.8 on admission  Hemoglobin 7.3-->8.0->8.3->8.5->8.2 -> 9.3-> 9.8 gradually improving  Received one unit PRBC during this admission    Continue to monitor  Iron study iron 36 and ferritin 66  Continue  iron pills   Dysphagia, secondary to stroke  PEG tube placed on 12/09/2017 for tube feeding  On free water  On bolus tube feeds  Total protein 7.6 (9/19) -> 7.4 ; Albumin 3.8 -> 3.2  MBSS 12/30/17 - new D1 thin liquid diet started 12/31/2017  Will monitor po intake   Other Stroke Risk Factors  ETOH use   Other Active Problems  ADHD - on Adderell at home  Anxiety  Depression  Hypokalemia, resolved   Hospital day # 3[place] MSN, APRN, ANVP-BC, AGPCNP-BC Advanced Practice Stroke Nurse [place] & Pager information 12/31/2017 8:26 AM   ATTENDING NOTE: I reviewed above note and agree with the assessment and plan. Pt was seen and examined.  No familyat bedside. No acute event overnight.Still global aphasia and right hemiplegia but able to walk with PT/OT for 5 feet yesterday.On b/l hand mitten to avoid pulling off trach. Social workerisstillworking onSNFplacement. BP on the high side, will increase labetalol to 3043mtid.  Passing swallow, on dysphagia 1 diet.  Monitor meal percentage on completion, may consider dietitian consult for calorie count in the next couple days.  JiRosalin HawkingMD PhD Stroke Neurology 12/31/2017 5:45 PM   To contact Stroke Continuity provider, please refer to [email or web address] hours, contact General Neurology

## 2017-12-31 NOTE — Progress Notes (Signed)
  Speech Language Pathology Treatment: Dysphagia;Cognitive-Linquistic;Passy Muir Speaking valve  Patient Details Name: Tammie Miles MRN: [id number] DOB: 19-Apr-1975 Today's Date: 12/31/2017 Time: 1191-4782 SLP Time Calculation (min) (ACUTE ONLY): 33 min  Assessment / Plan / Recommendation Clinical Impression  Pt was seen with PO trials following MBS on previous date to assess readiness to start PO diet. PMV was placed for 20 minutes throughout duration of trials with no overt signs of intolerance, although she still does not initiate phonation. VF adduction can be observed during spontaneous coughing. Pt self-fed trials of purees, soft solids, and thin liquids, initiating intake with Mod I but needing Min-Mod cues for sustained attention. Pt had improved command following during functional tasks, needing Mod cues from SLP. She had mild amounts of R buccal pocketing and R anterior spillage, primarily with solids. Use of straw with thin liquids facilitated improved labial seal. Although pocketing was mild initially, as trials progressed, it continued to build in her buccal cavity, and pt did not follow commands to use a lingual sweep. SLP used a swab to manually remove the partially masticated cracker from her oral cavity. One strong coughing episode immediately followed.   Given the above, recommend that pt start Dys 1 diet and thin liquids to facilitate reintroduction to PO intake. Would keep PMV in place during meals, as was tested on MBS. Pt has now worn the valve with SLP over three sessions, tolerating it for ~20 minutes each time. Would continue to use it in smaller intervals and only with full supervision, but this would allow pt to have it on for PO intake as well. Will continue to follow.   HPI HPI: 57 yoF with PMH significant for HTN, anxiety, ADHD, admitted 9/19 with large L hemorrhagic basal ganglia with 15 mm shift with early herniation s/p L frontotemporal crani 9/19 for evacuation of  hematoma. ETT 9/19; 9/20 returned to OR for decompressive craniectomy ; 10/1 trach/PEG; transitioned to TC and off vent 10/5. CT 9/24: 1. Left MCA distribution infarction is stable in distribution. Mild increased edema of the infarct with increased herniation of the left frontal lobe via the left frontal craniectomy. 2. Stable 6 mm left-to-right midline shift and partial effacement of left lateral ventricle. 3. Stable small hematoma within the left basal ganglia. CXR 10/9: no acute disease process. Pt trach changed yesterday to Shiley 6 cuffless      SLP Plan  Goals updated       Recommendations  Diet recommendations: Dysphagia 1 (puree);Thin liquid Liquids provided via: Cup;Straw Medication Administration: Crushed with puree(or via alternative means) Supervision: Staff to assist with self feeding;Full supervision/cueing for compensatory strategies Compensations: Slow rate;Small sips/bites;Minimize environmental distractions;Follow solids with liquid Postural Changes and/or Swallow Maneuvers: Seated upright 90 degrees      Patient may use Passy-Muir Speech Valve: Intermittently with supervision;During PO intake/meals PMSV Supervision: Full         Oral Care Recommendations: Oral care BID Follow up Recommendations: Skilled Nursing facility SLP Visit Diagnosis: Dysphagia, oropharyngeal phase (R13.12);Aphonia (R49.1);Cognitive communication deficit [phone]) Plan: Goals updated       GO                Maxcine Ham 12/31/2017, 1:20 PM  Maxcine Ham, M.A. CCC-SLP Acute Herbalist [phone] Office [phone]

## 2017-12-31 NOTE — Plan of Care (Signed)
Called by nurse that patient was found down on the floor, no obvious injury, no loss of consciousness, still awake alert, left skull intact, no evidence of hitting head, but patient seems upset and crying.  Vital signs stable.  Recommend left wrist soft restraint for safety.  No sitter needed at this time.  We will close monitoring.  Marvel Plan, MD PhD Stroke Neurology 12/31/2017 5:57 PM

## 2017-12-31 NOTE — Progress Notes (Signed)
Called to room by RN due to patient decannulating.  New trach tube #[id number] placed without difficulty.  ETCO2 had a positive color change for tube placement.  No issues noted at time of placement.

## 2017-12-31 NOTE — Progress Notes (Signed)
NAME:  Tammie Miles, MRN:  [id number], DOB:  Aug 14, 1975, LOS: 34 ADMISSION DATE:  11/27/2017, CONSULTATION DATE:  11/27/2017 REFERRING MD:  Dr. Conchita Paris CHIEF COMPLAINT:  Left frontal hemorrhage  Brief History   35 yoF admitted 9/19 with AMS that began the evening of 9/18, initially thought due to intoxication. Remained unresponsive through am of 9/19.  Patient was brought to the ED where a CT was done and showed a large left sided ICH. Patient was taken to the OR and a mini craniectomy was done.  Patient was brought out to the ICU and PCCM was consulted for vent management. Course complicated by cerebral edema requiring 3% saline limited by hypernatremia. Also complicated by encephalopathy. Tolerating vent wean, but mental status limited extubation. Then 9/25 she developed what appeared to be seizures with gaze deviation and tongue biting. EEG done overnight with results pending and AEDs adjusted. No further seizure seen.   Significant Hospital Events   9/19 Admit/ OR- mini crani 9/20 Decompressive craniectomy ( L)  with skull harvesting in the abdomen 9/21 hypertonic saline started 9/23 hypertonic stopped.  9/25 Seizure. EEG done with no sz activity. AEDs adjusted. 10/1 Trach / PEG 10/2 Trach collar    Consults: date of consult/date signed off & final recs  9/19 NSGY 9/19 PCCM   Procedures (surgical and bedside):  9/19 Intubated 9/19 OR for crani, evac of hematoma>> no change in neuro status post op 9/20 Decompressive craniectomy ( L)  with skull harvesting in the abdomen  Significant Diagnostic Tests:  9/19 [facility]  Large area of hemorrhage in the left basal ganglia and left frontal lobe, 5.8 x 3.5 cm. 15 mm of left-to-right midline shift. 9/19 CTA head: Positive for left MCA M1 large vessel occlusion, and also severe stenosis at the left ICA terminus, superimposed on the relatively large acute left hemisphere intra-axial hemorrhage (estimated blood volume 43 mL). Vasospasm suspected in  the proximal ACAs. 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 2. Negative for intracranial aneurysm, CTA spot sign, or evidence of vascular malformation in association with the acute hemorrhage. 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 3. Stable intracranial mass effect since 0938 hours today, with 16 mm of leftward midline shift and trapping of the right lateral ventricle. 11/29/2017 CT Head Large left MCA infarct is stable in distribution. Interval left frontal craniectomy with mild herniation of left frontal lobe through the defect. Resolved uncal herniation, improved patency of left lateral ventricle, and 6 mm left-to-right midline shift, previously 8 mm. Partial interval dispersion of acute hemorrhage in the left basal ganglia. Subcentimeter density anterior to the prior position of blood products possibly representing retracted clot or interval hemorrhage, attention at follow-up recommended. CT 9/24: 1. Left MCA distribution infarction is stable in distribution. Mild increased edema of the infarct with increased herniation of the left frontal lobe via the left frontal craniectomy. 2. Stable 6 mm left-to-right midline shift and partial effacement of left lateral ventricle. 3. Stable small hematoma within the left basal ganglia. 4. No new acute intracranial abnormality identified.  Micro Data: 9/19 MRSA PCR >> Negative 9/19 Sputum Culture >> normal flora 9/22 Blood x 2 >> negative 9/22 UA >> Normal 9/22 Sputum >> few candida dubliniensis 12/13/2017 blood cultures x2>> negative 12/13/2017 urine culture>> negative 12/13/2017 sputum culture>> Citrobacter  Antimicrobials:  9/19 Cefazolin (pre-op) 9/20  Subjective:  No events overnight, no new complaints  Objective   Blood pressure (!) 144/98, pulse 89,  temperature 99.3 F (37.4 C), temperature source Oral, resp. rate 17, height 5\' 1"  (1.549 m), weight 71.7 kg, SpO2 98 %.       No intake or output data in the 24 hours ending 12/31/17 1007 Filed Weights   12/29/17 0500 12/30/17 0407 12/31/17 0406  Weight: 73.9 kg 67.6 kg 71.7 kg   No intake or output data in the 24 hours ending 12/31/17 1007   Examination:  General: Chronically ill appearing female, NAD HEENT: [place]/AT, PERRL, EOM-I and MMM, size 4 cuffless trach in place Pulmonary: CTA bilaterally, secretions noted Cardiac: RRR, Nl S1/S2 and -M/R/G Abdomen: Soft, NT, ND and +BS Extremities: -edema and -tenderness, weakness noted General: Awake, aphasic.  Not able to phonate.  I reviewed CXR myself, trach is in a good position  Resolved Hospital Problem list     Assessment & Plan:  42 year old female s/p crani for MCA CVA now with a trach  Acute respiratory insufficiency in the setting of massive IPH - s/p trach placement 10/1. - Monitor for airway protection - PMV if able per SLP  Hypoxemia: - Titrate O2 for sat of 88-92%  Trach status: - Maintain cuffless 4 for now - No decannulation given mental status  Dysphagia: - Per SLP  Disposition / Summary of Today's Plan 12/31/17   Needs trach SNF placement, discussed with PCCM-NP.    Diet: TF (bolsu) Pain/Anxiety/Delirium protocol: None DVT prophylaxis: SCD's GI prophylaxis: pepcid  Hyperglycemia protocol: None Mobility: BR Code Status: full Family Communication: 9/30 spoke with son, reviewed patients overall state of health.  He is very articulate in his understanding of her illness.    Labs   CBC: Recent Labs  Lab 12/28/17 0435  WBC 7.9  HGB 9.8*  HCT 33.3*  MCV 83.5  PLT 273   Basic Metabolic Panel: Recent Labs  Lab 12/28/17 0435  NA 139  K 3.8  CL 103  CO2 24  GLUCOSE 102*  BUN 14  CREATININE 0.74  CALCIUM 9.8   GFR: Estimated Creatinine Clearance: 83 mL/min (by C-G formula based on SCr of  0.74 mg/dL). Recent Labs  Lab 12/28/17 0435  WBC 7.9   Liver Function Tests: Recent Labs  Lab 12/28/17 0435  AST 19  ALT 23  ALKPHOS 99  BILITOT 0.6  PROT 7.4  ALBUMIN 3.2*   No results for input(s): LIPASE, AMYLASE in the last 168 hours. No results for input(s): AMMONIA in the last 168 hours. ABG    Component Value Date/Time   PHART 7.444 12/08/2017 0414   PCO2ART 36.7 12/08/2017 0414   PO2ART 109 (H) 12/08/2017 0414   HCO3 24.6 12/08/2017 0414   TCO2 27 12/05/2017 1216   ACIDBASEDEF 6.0 (H) 11/29/2017 0310   O2SAT 98.2 12/08/2017 0414    Coagulation Profile: No results for input(s): INR, PROTIME in the last 168 hours. Cardiac Enzymes: No results for input(s): CKTOTAL, CKMB, CKMBINDEX, TROPONINI in the last 168 hours. HbA1C: Hgb A1c MFr Bld  Date/Time Value Ref Range Status  11/28/2017 08:32 AM 5.8 (H) 4.8 - 5.6 % Final    Comment:    (NOTE) Pre diabetes:          5.7%-6.4% Diabetes:              >6.4% Glycemic control for   <7.0% adults with diabetes    CBG: Recent Labs  Lab 12/29/17 2343 12/30/17 0422  12/30/17 0739 12/30/17 1151 12/30/17 1618  GLUCAP 122* 103* 99 97 126*   Alyson Reedy, M.D. [facility] Pulmonary/Critical Care Medicine. Pager: [phone]. After hours pager: [phone].

## 2018-01-01 DIAGNOSIS — N179 Acute kidney failure, unspecified: Secondary | ICD-10-CM

## 2018-01-01 DIAGNOSIS — J9500 Unspecified tracheostomy complication: Secondary | ICD-10-CM

## 2018-01-01 DIAGNOSIS — D72829 Elevated white blood cell count, unspecified: Secondary | ICD-10-CM

## 2018-01-01 DIAGNOSIS — D500 Iron deficiency anemia secondary to blood loss (chronic): Secondary | ICD-10-CM

## 2018-01-01 LAB — CBC
HCT: 34.4 % — ABNORMAL LOW (ref 36.0–46.0)
Hemoglobin: 10.5 g/dL — ABNORMAL LOW (ref 12.0–15.0)
MCH: 25.6 pg — AB (ref 26.0–34.0)
MCHC: 30.5 g/dL (ref 30.0–36.0)
MCV: 83.9 fL (ref 80.0–100.0)
NRBC: 0 % (ref 0.0–0.2)
PLATELETS: 246 10*3/uL (ref 150–400)
RBC: 4.1 MIL/uL (ref 3.87–5.11)
RDW: 22.1 % — AB (ref 11.5–15.5)
WBC: 7.8 10*3/uL (ref 4.0–10.5)

## 2018-01-01 LAB — BASIC METABOLIC PANEL
Anion gap: 11 (ref 5–15)
BUN: 12 mg/dL (ref 6–20)
CO2: 22 mmol/L (ref 22–32)
CREATININE: 1.48 mg/dL — AB (ref 0.44–1.00)
Calcium: 9.7 mg/dL (ref 8.9–10.3)
Chloride: 107 mmol/L (ref 98–111)
GFR calc Af Amer: 49 mL/min — ABNORMAL LOW (ref >60)
GFR, EST NON AFRICAN AMERICAN: 43 mL/min — AB (ref >60)
GLUCOSE: 143 mg/dL — AB (ref 70–99)
Potassium: 3.5 mmol/L (ref 3.5–5.1)
Sodium: 140 mmol/L (ref 135–145)

## 2018-01-01 MED ORDER — FREE WATER
200.0000 mL | Status: DC
  Administered 2018-01-01 – 2018-01-02 (×9): 200 mL

## 2018-01-01 MED ORDER — JEVITY 1.2 CAL PO LIQD
360.0000 mL | Freq: Three times a day (TID) | ORAL | Status: DC
  Administered 2018-01-01 – 2018-01-05 (×9): 360 mL
  Administered 2018-01-06: 350 mL
  Administered 2018-01-07: 360 mL
  Filled 2018-01-01 (×24): qty 474

## 2018-01-01 MED ORDER — ENSURE ENLIVE PO LIQD
237.0000 mL | Freq: Two times a day (BID) | ORAL | Status: DC
  Administered 2018-01-01 – 2018-01-08 (×10): 237 mL via ORAL

## 2018-01-01 NOTE — Progress Notes (Addendum)
STROKE TEAM - DAILY PROGRESS NOTE   SUBJECTIVE Pt fell out of the bed last evening without injury. Again, pulled out her trach. Now with 2 open wounds in cranial suture line. Both therapy and RN say they were not there earlier today. I think with wrist restraint (and no mitten) pt took her nails and scratched remaining scabs off, leading to open wounds. There is no drainage. Mitten placed on hand. Advised nursing staff to cut her nails.  OBJECTIVE Most recent Vital Signs: Vitals:   01/01/18 1154 01/01/18 1211 01/01/18 1509 01/01/18 1618  BP: 133/71   128/88  Pulse: 97 96 92 86  Resp: '20 20 20 14  '$ Temp: 98.7 F (37.1 C)   98.3 F (36.8 C)  TempSrc: Oral   Oral  SpO2: 95% 97% 97%   Weight:      Height:       CBG (last 3)  Recent Labs    12/30/17 1618 12/31/17 1216 12/31/17 1635  GLUCAP 126* 159* 89     CBC Latest Ref Rng & Units 01/01/2018 12/28/2017 12/22/2017  WBC 4.0 - 10.5 K/uL 7.8 7.9 9.6  Hemoglobin 12.0 - 15.0 g/dL 10.5(L) 9.8(L) 9.3(L)  Hematocrit 36.0 - 46.0 % 34.4(L) 33.3(L) 31.9(L)  Platelets 150 - 400 K/uL 246 273 385    BMP Latest Ref Rng & Units 01/01/2018 12/28/2017 12/22/2017  Glucose 70 - 99 mg/dL 143(H) 102(H) 136(H)  BUN 6 - 20 mg/dL '12 14 13  '$ Creatinine 0.44 - 1.00 mg/dL 1.48(H) 0.74 0.81  Sodium 135 - 145 mmol/L 140 139 139  Potassium 3.5 - 5.1 mmol/L 3.5 3.8 3.6  Chloride 98 - 111 mmol/L 107 103 102  CO2 22 - 32 mmol/L '22 24 25  '$ Calcium 8.9 - 10.3 mg/dL 9.7 9.8 10.0     Physical Exam no change in exam HEENT-status post left frontoparietal craniotomy with wound clean dry, soft to touch, L cranial edema decreasing Cardiovascular- S1-S2 audible, pulses palpable throughout   Lungs- breath sounds clear. Status post tracheostomy with trach collar, nonlabored breathing. Spontaneously cough w/ clear sputum Abdomen- bowel sounds present, RLQ wound CDI musculoskeletal-no joint tenderness, deformity or  swelling Skin-warm and dry, 2 small open wounds in cranial suture line, no drainage. Fingernails at least 0.5" long beyond fingertips  Neurological Exam:  Awake, alert, no speech output. More socially interactive. Will raise L arm to command, hold/release examiner's hand to command. Will not show fingers. Attempts to smile. Eyes open, EOMI, PERRL, mild left ptosis. Not blinking to visual threat on the right, but able to track objects on the right. Right facial droop. Tongue protrusion not cooperative. Attempts to smile. LUE 5/5 and able to hold against gravity. LLE 5/5. RUE 2/5 on pain stimuli and RLE 2+/5 to pain. R sided tone. B/l babinski muted. Sensation, coordination and gait not otherwise tested.   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 15 mm of left-to-right midline shift.  MRI of the brain: Post craniotomy 8-9 mm of right-to-left midline shift with mild left uncal herniation similar to previous. Underlying evolving large acute ischemic left MCA  territory infarct involving the majority of the left MCA territory.  Repeat Head CT post evacuation 9/20 - Left MCA territory infarct with evacuated hematoma. Midline shift measures 8 mm.   UDS - positive for amphetamines ( Pt takes Adderal at home)  2D Echocardiogram EF 60 to 65%  LE venous doppler negative for DVT  Hypercoagulable labs negative except high ESR and CRP  LDL 64  HgbA1c 5.8  VTE: Heparin subq   On ASA 81 for stroke prevention  Therapy recommendations: SNF  Disposition: Pending   Helmet at bedside. To wear when OOB  or at risk for fall  Fall risk  Has low bed  Soft Helmet for use when at risk for fall  L arm restrained. L mitten placed on  Acute respiratory insufficiency in the setting of massive IPH s/p trach  Status post tracheostomy on 12/09/17  Doing well on trach collar  Repetitively pulls/attempts to pull out trach, mitt placed on  Has #4 shiley/cuffless.   PMV underway w/ SLP  Per CCM, not a candidate for decannulation at the point but may be in the future  Seizure  Episode of stiffness, tongue biting on 12/03/17  Overnight EEG no seizure  Increased keppra from 500 to '1000mg'$  bid  No more seizure since  Seizure precautions  Hypertension emergency  Stable  BP elevated on admission  On norvasc 10,  Labetalol 300 tid  Off minoxidil and HCTZ, lisinopril 20 bid  BP improved on increased labetalol (decreased to 200 Sat and increased back to 300 Wed)  Stopped lisinopril 10/24 d/t increased creatinine  BP goal normotensive  Anemia  Hb 11.8 on admission  Hemoglobin 7.3-->8.0->8.3->8.5->8.2 -> 9.3-> 9.8->10.5 improving  Received one unit PRBC during this admission    Continue to monitor  Iron study iron 36 and ferritin 66  Continue  iron pills   Dysphagia, secondary to stroke  PEG tube placed on 12/09/2017 for tube feeding  On free water  On bolus tube feeds  Total protein 7.6 (9/19) -> 7.4 ; Albumin 3.8 -> 3.2  Now on D1 thin liquid diet and tolerating  Calorie count underway  SLP/RD w/ plans to slowly decrease TF per RN  Acute Renal Failure  Elevated Cr 1.48, BUN ok.   ? Etiology  Continued to receive TF and water during the night  Will Increase free water from 140 qid to 200 q4h  d/c lisinopril.   Recheck labs in am   Other Stroke Risk Factors  ETOH use   Other Active Problems  ADHD - on Adderell at home  Anxiety  Depression  Hypokalemia, resolved   Hospital day # [place], MSN, APRN, ANVP-BC, AGPCNP-BC Advanced  Practice Stroke Nurse [place] for Schedule & Pager information 01/01/2018 5:34 PM   ATTENDING NOTE: I reviewed above note and agree with the assessment and plan. Pt was seen and examined.   Pt lying in bed with left wrist soft strain.  Patient fell to the ground yesterday afternoon, no obvious injury, put back in bed, had left wrist restraints since then.  Neuro stable, no significant change.  However, on today's left check, normal sodium and BUN, however creatinine jumped from 0.74-1.48, etiology unclear.  Discontinue lisinopril and increase free water.  Anemia much improved hemoglobin up to 10.5 right now.  Past swallow on dysphagia 1 diet, will do calorie count.  Repeat BMP in a.m.  Tammie Hawking, MD PhD Stroke Neurology 01/01/2018 9:38 PM  To contact Stroke Continuity provider, please refer to [email or web address] After hours, contact General Neurology

## 2018-01-01 NOTE — Progress Notes (Signed)
Nutrition Follow-up  DOCUMENTATION CODES:   Obesity unspecified  INTERVENTION:  Provide Ensure Enlive po BID, each supplement provides 350 kcal and 20 grams of protein.  Recommend letting pt to attempt to eat at meals, if po intake at that meal is <50% then administer a bolus tube feeding using Jevity 1.2 formula via PEG at volume of 360 ml.  Additionally continue providing HS bolus feed.  Continue free water flushes of 200 ml q 4 hours per tube (MD to adjust as appropriate)  Full bolus tube feeds to provide 1728 kcal, 80 grams of protein, and 2366 ml water.  NUTRITION DIAGNOSIS:   Inadequate oral intake related to inability to eat as evidenced by NPO status; diet advanced; improving  GOAL:   Patient will meet greater than or equal to 90% of their needs; progressing  MONITOR:   PO intake, TF tolerance, Supplement acceptance, Diet advancement, Weight trends, Labs, Skin, I & O's  REASON FOR ASSESSMENT:   Consult Enteral/tube feeding initiation and management  ASSESSMENT:   Pt with PMH of HTN with prescribed stimulant use admitted with large L hemorrhagic basal ganglia with 15 mm shift with early herniation s/p L frontotemporal craniotomy for evacuation of hematoma.  9/20 Decompressive, craniectomy. 10/1 trach/PEG placed.   Diet has been advanced to a dysphagia 1 diet with thin liquids. RN reports pt ate 100% of breakfast this AM. RD to modify tube feeding orders to provide a bolus tube feed only if pt consume less than 50% at a meal. RD to order Ensure to aid in caloric and protein needs. Labs and medications reviewed.   Diet Order:   Diet Order            DIET - DYS 1 Room service appropriate? Yes; Fluid consistency: Thin  Diet effective now              EDUCATION NEEDS:   Not appropriate for education at this time  Skin:  Skin Assessment: Skin Integrity Issues: Skin Integrity Issues:: Stage II, Incisions Stage II: coccyx Incisions: L head, abdomen  Last  BM:  10/23  Height:   Ht Readings from Last 1 Encounters:  12/01/17 5\' 1"  (1.549 m)    Weight:   Wt Readings from Last 1 Encounters:  01/01/18 65.3 kg    Ideal Body Weight:  47.7 kg  BMI:  Body mass index is 27.21 kg/m.  Estimated Nutritional Needs:   Kcal:  1600-1800  Protein:  80-100 grams  Fluid:  > 1.6 L/day    Roslyn Smiling, MS, RD, LDN Pager # [phone] After hours/ weekend pager # [phone]

## 2018-01-01 NOTE — Progress Notes (Signed)
Physical Therapy Treatment Patient Details Name: Tammie Miles MRN: [id number] DOB: 1976/03/10 Today's Date: 01/01/2018    History of Present Illness Patient is a 42 y/o female who was admitted for AMS/unresponsiveness. 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 Returned to OR 9/20 for decompressive crani with left skull harvested in abdomen. 9/25 developed seizures. Intubated 9/19-10/7. s/p trach and peg 10/1. PMH includes HTN, depression, anxiety.     PT Comments    Pt performed increased activity and progressed gait training with +2 assistance and +3 for close chair follow.  Pt required assistance to advance RLE forward and keep R hand on RW.  Pt with poor proprioception when advancing feet forward.  Pt scissoring with R foot over L.  PLan for SNF placement remains appropriate at this time based on her current functional deficits.     Follow Up Recommendations  SNF     Equipment Recommendations  None recommended by PT    Recommendations for Other Services       Precautions / Restrictions Precautions Precautions: Fall(fell 10/23) Precaution Comments: trach collar, PEG, no bone flap L skull Required Braces or Orthoses: Other Brace/Splint Other Brace/Splint: Bilateral PRAFO Boots In bed only please apply ace wrap on top to secure as they are size too big to fit appropriately,  HELMET TO BE WORN OOB AT ALL TIMES, CAN BE OFF IN BED.   Restrictions Weight Bearing Restrictions: No    Mobility  Bed Mobility Overal bed mobility: Needs Assistance   Rolling: Mod assist Sidelying to sit: Mod assist;+2 for safety/equipment       General bed mobility comments: cues for sequencing and truncal assist  Transfers Overall transfer level: Needs assistance Equipment used: Rolling walker (2 wheeled) Transfers: Sit to/from Stand Sit  to Stand: Min assist;+2 physical assistance         General transfer comment: Cues for hand placement,  Pt holding to RW and required assistance to push through B LEs to achieve standing.  Pt required facilitation of hip flexion to return to seated position.   Ambulation/Gait Ambulation/Gait assistance: Max assist;+2 physical assistance Gait Distance (Feet): 20 Feet Assistive device: Rolling walker (2 wheeled) Gait Pattern/deviations: Step-to pattern;Scissoring;Ataxic;Shuffle;Decreased dorsiflexion - right;Decreased step length - right     General Gait Details: Hemiparetic gait.  R LE needed full assist to advance and assisted with w/shift.  Pt required assistance to keep R hand on RW.     Stairs             Wheelchair Mobility    Modified Rankin (Stroke Patients Only) Modified Rankin (Stroke Patients Only) Pre-Morbid Rankin Score: No symptoms Modified Rankin: Moderately severe disability     Balance     Sitting balance-Leahy Scale: Fair       Standing balance-Leahy Scale: Poor Standing balance comment: reliant on UE support/external assist from therapist                             Cognition Arousal/Alertness: Awake/alert Behavior During Therapy: Flat affect Overall Cognitive Status: Difficult to assess                                 General Comments: Pt following commands better.  Exercises General Exercises - Lower Extremity Heel Slides: AROM;Both;10 reps;Supine;PROM;Limitations Heel Slides Limitations: RLE passive with clonic jerking.      General Comments        Pertinent Vitals/Pain Pain Assessment: Faces Pain Score: 0-No pain    Home Living                      Prior Function            PT Goals (current goals can now be found in the care plan section) Acute Rehab PT Goals Patient Stated Goal: unable to participate with goals Potential to Achieve Goals: Fair Additional Goals Additional Goal  #1: Pt will show purposeful movement to v/t cuing. Progress towards PT goals: Progressing toward goals    Frequency    Min 3X/week      PT Plan Current plan remains appropriate    Co-evaluation              AM-PAC PT "6 Clicks" Daily Activity  Outcome Measure  Difficulty turning over in bed (including adjusting bedclothes, sheets and blankets)?: Unable Difficulty moving from lying on back to sitting on the side of the bed? : Unable Difficulty sitting down on and standing up from a chair with arms (e.g., wheelchair, bedside commode, etc,.)?: Unable Help needed moving to and from a bed to chair (including a wheelchair)?: A Little Help needed walking in hospital room?: A Lot Help needed climbing 3-5 steps with a railing? : Total 6 Click Score: 9    End of Session Equipment Utilized During Treatment: Gait belt Activity Tolerance: Patient tolerated treatment well Patient left: in chair;with call bell/phone within reach;with chair alarm set Nurse Communication: Mobility status PT Visit Diagnosis: Other abnormalities of gait and mobility (R26.89);Muscle weakness (generalized) (M62.81);Hemiplegia and hemiparesis Hemiplegia - Right/Left: Right Hemiplegia - caused by: Nontraumatic intracerebral hemorrhage     Time: 4098-1191 PT Time Calculation (min) (ACUTE ONLY): 29 min  Charges:  $Gait Training: 8-22 mins $Therapeutic Activity: 8-22 mins                     Joycelyn Rua, PTA Acute Rehabilitation Services Pager [phone] Office [phone]     Jailen Lung Artis Delay 01/01/2018, 10:00 AM

## 2018-01-02 LAB — BASIC METABOLIC PANEL
ANION GAP: 11 (ref 5–15)
BUN: 10 mg/dL (ref 6–20)
CHLORIDE: 105 mmol/L (ref 98–111)
CO2: 23 mmol/L (ref 22–32)
Calcium: 9.6 mg/dL (ref 8.9–10.3)
Creatinine, Ser: 0.71 mg/dL (ref 0.44–1.00)
Glucose, Bld: 98 mg/dL (ref 70–99)
POTASSIUM: 3.7 mmol/L (ref 3.5–5.1)
SODIUM: 139 mmol/L (ref 135–145)

## 2018-01-02 MED ORDER — FREE WATER
150.0000 mL | Freq: Four times a day (QID) | Status: DC
  Administered 2018-01-03 – 2018-01-08 (×17): 150 mL

## 2018-01-02 NOTE — Progress Notes (Signed)
LATE NOTE ENTRY   CSW received call from Janel with [facility] that they are unable to offer a bed for the patient at this time, due to patient not being medically stable as she continues to pull out her trach and requires mittens/restraints.   CSW to continue to follow. Patient has no bed offers.  Blenda Nicely, [place] Clinical Social Worker [phone]

## 2018-01-02 NOTE — Progress Notes (Signed)
Physical Therapy Treatment Patient Details Name: Tammie Miles MRN: [id number] DOB: 04-13-1975 Today's Date: 01/02/2018    History of Present Illness Patient is a 42 y/o female who was admitted for AMS/unresponsiveness. 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 Returned to OR 9/20 for decompressive crani with left skull harvested in abdomen. 9/25 developed seizures. Intubated 9/19-10/7. s/p trach and peg 10/1. PMH includes HTN, depression, anxiety.     PT Comments    Pt performed gait training with improved distance and better ability to keep hands on RW with use of R hand splint.  Pt remains ataxic, requiring cues for increased BOS, and assistance for R foot clearance and advancement.  Pt continues to benefit from skilled rehab in a SNF ST until strength and function improves.  Pt is becoming more purposeful in her movements and following commands better.     Follow Up Recommendations  SNF     Equipment Recommendations  None recommended by PT    Recommendations for Other Services       Precautions / Restrictions Precautions Precautions: Fall(fell 10/23 ) Precaution Comments: trach collar, PEG, no bone flap L skull Required Braces or Orthoses: Other Brace/Splint Other Brace/Splint: Bilateral PRAFO Boots In bed only please apply ace wrap on top to secure as they are size too big to fit appropriately,  HELMET TO BE WORN OOB AT ALL TIMES, CAN BE OFF IN BED.   Restrictions Weight Bearing Restrictions: No    Mobility  Bed Mobility Overal bed mobility: Needs Assistance       Supine to sit: Mod assist;+2 for physical assistance     General bed mobility comments: Pt required assistance to advance RLE to edge of bed and to elevate trunk into sitting.  Increased time to scoot to edge of bed.    Transfers Overall transfer level:  Needs assistance Equipment used: Rolling walker (2 wheeled)(R hand splint on RW.  ) Transfers: Sit to/from Stand Sit to Stand: Mod assist;+2 safety/equipment         General transfer comment: Cues for hand placement to push from seated surface.  Pt required repeated cues as she kept reaching to pull on RW to achieve standing.  Hand over hand guidance to reach back for seated surface.  Pt required assistance to strap R hand to walker splint and to unstrap when finished with gait training.    Ambulation/Gait Ambulation/Gait assistance: Mod assist;Max assist;+2 physical assistance(MOD +2  with exception of LOB to the R hard requring max assistance to correct.  ) Gait Distance (Feet): 35 Feet Assistive device: Rolling walker (2 wheeled) Gait Pattern/deviations: Step-to pattern;Scissoring;Ataxic;Shuffle;Decreased dorsiflexion - right;Decreased step length - right     General Gait Details: Hemiparetic gait.  R LE needed full assist to advance and assisted with w/shift.  No assistance required to R hand with use of hand splint.  Pt required assistance to progress RW and keep safe position in RW.      Stairs             Wheelchair Mobility    Modified Rankin (Stroke Patients Only)       Balance Overall balance assessment: Needs assistance   Sitting balance-Leahy Scale: Fair Sitting balance - Comments: min guard sitting EOB without any further challenge Postural control: Right lateral lean   Standing balance-Leahy Scale: Poor Standing balance comment: reliant on  UE support/external assist from therapist, Hard R lateral LOB during gait training.                              Cognition Arousal/Alertness: Awake/alert Behavior During Therapy: Flat affect Overall Cognitive Status: Difficult to assess                                 General Comments: Continues to follow commands with increased time.        Exercises General Exercises - Lower  Extremity Long Arc Quad: AROM;Both;10 reps;Seated(arom L side, prom R side) Hip Flexion/Marching: AROM;PROM;Both;10 reps;Seated(arom L side, prom R side)    General Comments        Pertinent Vitals/Pain Pain Assessment: Faces Pain Score: 0-No pain    Home Living                      Prior Function            PT Goals (current goals can now be found in the care plan section) Acute Rehab PT Goals Patient Stated Goal: unable to participate with goals Potential to Achieve Goals: Fair Additional Goals Additional Goal #1: Pt will show purposeful movement to v/t cuing. Progress towards PT goals: Progressing toward goals    Frequency    Min 3X/week      PT Plan Current plan remains appropriate    Co-evaluation              AM-PAC PT "6 Clicks" Daily Activity  Outcome Measure  Difficulty turning over in bed (including adjusting bedclothes, sheets and blankets)?: Unable Difficulty moving from lying on back to sitting on the side of the bed? : Unable Difficulty sitting down on and standing up from a chair with arms (e.g., wheelchair, bedside commode, etc,.)?: Unable Help needed moving to and from a bed to chair (including a wheelchair)?: A Little Help needed walking in hospital room?: A Lot Help needed climbing 3-5 steps with a railing? : Total 6 Click Score: 9    End of Session Equipment Utilized During Treatment: Gait belt Activity Tolerance: Patient tolerated treatment well Patient left: in chair;with call bell/phone within reach;with chair alarm set Nurse Communication: Mobility status PT Visit Diagnosis: Other abnormalities of gait and mobility (R26.89);Muscle weakness (generalized) (M62.81);Hemiplegia and hemiparesis Hemiplegia - Right/Left: Right Hemiplegia - caused by: Nontraumatic intracerebral hemorrhage     Time: 1610-9604 PT Time Calculation (min) (ACUTE ONLY): 44 min  Charges:  $Gait Training: 8-22 mins $Therapeutic Exercise: 8-22  mins $Therapeutic Activity: 8-22 mins                     Joycelyn Rua, PTA Acute Rehabilitation Services Pager [phone] Office [phone]     Stepehn Eckard Artis Delay 01/02/2018, 10:59 AM

## 2018-01-02 NOTE — Progress Notes (Signed)
STROKE TEAM - DAILY PROGRESS NOTE   SUBJECTIVE No acute event overnight. Still has left wrist soft restrain. Cre repeat today normal, suspect yesterday Cre abnormal likely lab error. BP stable off lisinopril.   OBJECTIVE Most recent Vital Signs: Vitals:   01/02/18 1507 01/02/18 1534 01/02/18 1920 01/02/18 1929  BP:  122/82 (!) 136/92   Pulse: 93 88 99 97  Resp: 20 (!) 25 18 17   Temp:  99.2 F (37.3 C) 98.4 F (36.9 C)   TempSrc:  Axillary Oral   SpO2: 94% 96% 98% 97%  Weight:      Height:       CBG (last 3)  Recent Labs    12/31/17 1216 12/31/17 1635  GLUCAP 159* 89     CBC Latest Ref Rng & Units 01/01/2018 12/28/2017 12/22/2017  WBC 4.0 - 10.5 K/uL 7.8 7.9 9.6  Hemoglobin 12.0 - 15.0 g/dL 10.5(L) 9.8(L) 9.3(L)  Hematocrit 36.0 - 46.0 % 34.4(L) 33.3(L) 31.9(L)  Platelets 150 - 400 K/uL 246 273 385    BMP Latest Ref Rng & Units 01/02/2018 01/01/2018 12/28/2017  Glucose 70 - 99 mg/dL 98 143(H) 102(H)  BUN 6 - 20 mg/dL 10 12 14   Creatinine 0.44 - 1.00 mg/dL 0.71 1.48(H) 0.74  Sodium 135 - 145 mmol/L 139 140 139  Potassium 3.5 - 5.1 mmol/L 3.7 3.5 3.8  Chloride 98 - 111 mmol/L 105 107 103  CO2 22 - 32 mmol/L 23 22 24   Calcium 8.9 - 10.3 mg/dL 9.6 9.7 9.8     Physical Exam no change in exam HEENT-status post left frontoparietal craniotomy with wound clean dry, soft to touch, L cranial edema decreasing Cardiovascular- S1-S2 audible, pulses palpable throughout   Lungs- breath sounds clear. Status post tracheostomy with trach collar, nonlabored breathing. Spontaneously cough w/ clear sputum Abdomen- bowel sounds present, RLQ wound CDI musculoskeletal-no joint tenderness, deformity or swelling Skin-warm and dry, 2 small open wounds in cranial suture line, no drainage. Fingernails at least 0.5" long beyond fingertips  Neurological Exam:  Awake, alert, no speech output. More socially interactive. Will raise L arm to  command, hold/release examiner's hand to command. Will not show fingers. Attempts to smile. Eyes open, EOMI, PERRL, mild left ptosis. Not blinking to visual threat on the right, but able to track objects on the right. Right facial droop. Tongue protrusion not cooperative. Attempts to smile. LUE 5/5 and able to hold against gravity. LLE 5/5. RUE 2/5 on pain stimuli and RLE 2+/5 to pain. R sided tone. B/l babinski muted. Sensation, coordination and gait not otherwise tested.  No significant change from yesterday.    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 15 mm of left-to-right midline shift.  MRI of the brain: Post craniotomy 8-9 mm of right-to-left midline shift with mild left uncal herniation similar to previous. Underlying evolving large acute ischemic left MCA territory infarct involving the majority of the left MCA territory.  Repeat Head CT post evacuation 9/20 - Left MCA territory infarct with evacuated hematoma. Midline shift measures 8 mm.   UDS - positive for amphetamines ( Pt  takes Adderal at home)  2D Echocardiogram EF 60 to 65%  LE venous doppler negative for DVT  Hypercoagulable labs negative except high ESR and CRP  LDL 64  HgbA1c 5.8  VTE: Heparin subq   On ASA 81 for stroke prevention  Therapy recommendations: SNF  Disposition: Pending   Helmet at bedside. To wear when OOB or at risk for fall  Fall risk  Has low bed  Soft Helmet for use when at risk for fall  L arm restrained. L mitten placed on  Acute respiratory insufficiency in the setting of massive  IPH s/p trach  Status post tracheostomy on 12/09/17  Doing well on trach collar  Repetitively pulls/attempts to pull out trach, mitt placed on  Has #4 shiley/cuffless.   PMV underway w/ SLP  Per CCM, not a candidate for decannulation at the point but may be in the future  Seizure  Episode of stiffness, tongue biting on 12/03/17  Overnight EEG no seizure  Increased keppra from 500 to 1067m bid  No more seizure since  Seizure precautions  Hypertension emergency  Stable  BP elevated on admission  On norvasc 10,  Labetalol 300 tid  Off minoxidil and HCTZ, lisinopril 20 bid  BP improved on increased labetalol (decreased to 200 Sat and increased back to 300 Wed)  Stopped lisinopril 10/24 d/t increased creatinine  BP goal normotensive  Anemia  Hb 11.8 on admission  Hemoglobin 7.3-->8.0->8.3->8.5->8.2 -> 9.3-> 9.8->10.5 improving  Received one unit PRBC during this admission    Continue to monitor  Iron study iron 36 and ferritin 66  Continue  iron pills   Dysphagia, secondary to stroke  PEG tube placed on 12/09/2017 for tube feeding  On free water  On bolus tube feeds  Total protein 7.6 (9/19) -> 7.4 ; Albumin 3.8 -> 3.2  Now on D1 thin liquid diet and tolerating  Calorie count underway  SLP/RD w/ plans to slowly decrease TF per RN   Other Stroke Risk Factors  ETOH use   Other Active Problems  ADHD - on Adderell at home  Anxiety  Depression  Hypokalemia, resolved   Hospital day # 36   Tammie Hawking MD PhD Stroke Neurology 01/02/2018 10:59 PM    To contact Stroke Continuity provider, please refer to [email or web address] After hours, contact General Neurology

## 2018-01-02 NOTE — Progress Notes (Signed)
CSW continuing to follow for SNF placement. Patient has no bed at this time, is difficult to place.  Blenda Nicely, [place] Clinical Social Worker [phone]

## 2018-01-02 NOTE — Progress Notes (Signed)
  Speech Language Pathology Treatment: Dysphagia  Patient Details Name: Tammie Miles MRN: [id number] DOB: 25-Mar-1975 Today's Date: 01/02/2018 Time: 4098-1191 SLP Time Calculation (min) (ACUTE ONLY): 13 min  Assessment / Plan / Recommendation Clinical Impression  PMV was placed for PO trials, with intermittent vocalizations noted today. She does not have any verbalizations or purposeful speech, and she does not repeat at the sound level. Pt had a swift appearing swallow with purees and thin liquids. No R buccal pocketing noted. She did have one delayed cough after PO intake that was not directly correlated with swallowing, but warrants additional monitoring. She remains afebrile for now, and RN reports fluctuating intake. Recommend continuing Dys 1 diet and thin liquids for now with additional, advanced trials at bedside to assess for readiness to upgrade diet.   HPI HPI: 30 yoF with PMH significant for HTN, anxiety, ADHD, admitted 9/19 with large L hemorrhagic basal ganglia with 15 mm shift with early herniation s/p L frontotemporal crani 9/19 for evacuation of hematoma. ETT 9/19; 9/20 returned to OR for decompressive craniectomy ; 10/1 trach/PEG; transitioned to TC and off vent 10/5. CT 9/24: 1. Left MCA distribution infarction is stable in distribution. Mild increased edema of the infarct with increased herniation of the left frontal lobe via the left frontal craniectomy. 2. Stable 6 mm left-to-right midline shift and partial effacement of left lateral ventricle. 3. Stable small hematoma within the left basal ganglia. CXR 10/9: no acute disease process. Pt trach changed yesterday to Shiley 6 cuffless      SLP Plan  Continue with current plan of care       Recommendations  Diet recommendations: Dysphagia 1 (puree);Thin liquid Liquids provided via: Cup;Straw Medication Administration: Crushed with puree(or via tube) Supervision: Staff to assist with self feeding;Full supervision/cueing for  compensatory strategies Compensations: Slow rate;Small sips/bites;Minimize environmental distractions;Follow solids with liquid Postural Changes and/or Swallow Maneuvers: Seated upright 90 degrees                Oral Care Recommendations: Oral care BID Follow up Recommendations: Skilled Nursing facility SLP Visit Diagnosis: Dysphagia, oropharyngeal phase (R13.12) Plan: Continue with current plan of care       GO                Maxcine Ham 01/02/2018, 5:03 PM  Maxcine Ham, M.A. CCC-SLP Acute Herbalist [phone] Office [phone]

## 2018-01-02 NOTE — Care Management Note (Signed)
Case Management Note  Patient Details  Name: BEBE MONCURE MRN: [id number] Date of Birth: Aug 25, 1975  Subjective/Objective:                    Action/Plan: Pt continues on the DTP list for SNF. CM following.  Expected Discharge Date:                  Expected Discharge Plan:  Skilled Nursing Facility  In-House Referral:  Clinical Social Work  Discharge planning Services  CM Consult  Post Acute Care Choice:    Choice offered to:     DME Arranged:    DME Agency:     HH Arranged:    HH Agency:     Status of Service:  In process, will continue to follow  If discussed at Long Length of Stay Meetings, dates discussed:    Additional Comments:  Kermit Balo, RN 01/02/2018, 11:27 AM

## 2018-01-02 NOTE — Progress Notes (Signed)
NAME:  Tammie Miles, MRN:  [id number], DOB:  Jun 26, 1975, LOS: 36 ADMISSION DATE:  11/27/2017, CONSULTATION DATE:  11/27/2017 REFERRING MD:  Dr. Conchita Paris CHIEF COMPLAINT:  Left frontal hemorrhage  Brief History   42 yoF admitted 9/19 with AMS that began the evening of 9/18, initially thought due to intoxication. Remained unresponsive through am of 9/19.  Patient was brought to the ED where a CT was done and showed a large left sided ICH. Patient was taken to the OR and a mini craniectomy was done.  Patient was brought out to the ICU and PCCM was consulted for vent management. Course complicated by cerebral edema requiring 3% saline limited by hypernatremia. Also complicated by encephalopathy. Tolerating vent wean, but mental status limited extubation. Then 9/25 she developed what appeared to be seizures with gaze deviation and tongue biting. EEG done overnight with results pending and AEDs adjusted. No further seizure seen.   Significant Hospital Events   9/19 Admit/ OR- mini crani 9/20 Decompressive craniectomy ( L)  with skull harvesting in the abdomen 9/21 hypertonic saline started 9/23 hypertonic stopped.  9/25 Seizure. EEG done with no sz activity. AEDs adjusted. 10/1 Trach / PEG 10/2 Trach collar    Consults: date of consult/date signed off & final recs  9/19 NSGY 9/19 PCCM   Procedures (surgical and bedside):  9/19 Intubated 9/19 OR for crani, evac of hematoma>> no change in neuro status post op 9/20 Decompressive craniectomy ( L)  with skull harvesting in the abdomen  Significant Diagnostic Tests:  9/19 [facility]  Large area of hemorrhage in the left basal ganglia and left frontal lobe, 5.8 x 3.5 cm. 15 mm of left-to-right midline shift. 9/19 CTA head: Positive for left MCA M1 large vessel occlusion, and also severe stenosis at the left ICA terminus, superimposed on the relatively large acute left hemisphere intra-axial hemorrhage (estimated blood volume 43 mL). Vasospasm suspected in  the proximal ACAs. 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 2. Negative for intracranial aneurysm, CTA spot sign, or evidence of vascular malformation in association with the acute hemorrhage. 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 3. Stable intracranial mass effect since 0938 hours today, with 16 mm of leftward midline shift and trapping of the right lateral ventricle. 11/29/2017 CT Head Large left MCA infarct is stable in distribution. Interval left frontal craniectomy with mild herniation of left frontal lobe through the defect. Resolved uncal herniation, improved patency of left lateral ventricle, and 6 mm left-to-right midline shift, previously 8 mm. Partial interval dispersion of acute hemorrhage in the left basal ganglia. Subcentimeter density anterior to the prior position of blood products possibly representing retracted clot or interval hemorrhage, attention at follow-up recommended. CT 9/24: 1. Left MCA distribution infarction is stable in distribution. Mild increased edema of the infarct with increased herniation of the left frontal lobe via the left frontal craniectomy. 2. Stable 6 mm left-to-right midline shift and partial effacement of left lateral ventricle. 3. Stable small hematoma within the left basal ganglia. 4. No new acute intracranial abnormality identified.  Micro Data: 9/19 MRSA PCR >> Negative 9/19 Sputum Culture >> normal flora 9/22 Blood x 2 >> negative 9/22 UA >> Normal 9/22 Sputum >> few candida dubliniensis 12/13/2017 blood cultures x2>> negative 12/13/2017 urine culture>> negative 12/13/2017 sputum culture>> Citrobacter  Antimicrobials:  9/19 Cefazolin (pre-op) 9/20  Subjective:  No events overnight, no new complaints  Objective   Blood pressure 122/84, pulse 85,  temperature 98.3 F (36.8 C), temperature source Oral, resp. rate 20, height 5\' 1"  (1.549 m), weight 65.3 kg, SpO2 98 %.        Intake/Output Summary (Last 24 hours) at 01/02/2018 1344 Last data filed at 01/02/2018 0930 Gross per 24 hour  Intake 70 ml  Output -  Net 70 ml   Filed Weights   12/30/17 0407 12/31/17 0406 01/01/18 0500  Weight: 67.6 kg 71.7 kg 65.3 kg    Intake/Output Summary (Last 24 hours) at 01/02/2018 1344 Last data filed at 01/02/2018 0930 Gross per 24 hour  Intake 70 ml  Output -  Net 70 ml     Examination:  General: Chronically ill appearing female, on TC, NAD HEENT: [place]/AT, PERRL, EOM-I and MMM, size 4 cuffless trach in place Pulmonary: CTA bilaterally, decreased secretions compared to before Cardiac: RRR, Nl S1/S2 and -M/R/G Abdomen: Soft, NT, ND and +BS Extremities: -edema and -tenderness, weakness noted General: Awake but unable to phonate  I reviewed the last CXR myself, no acute disease noted  Resolved Hospital Problem list     Assessment & Plan:  42 year old female s/p crani for MCA CVA now with a trach  Acute respiratory insufficiency in the setting of massive IPH - s/p trach placement 10/1. - Monitor closely for airway protection given cuffless trach - PMV per SLP  Hypoxemia: - Titrate O2 for sat of 88-92%  Trach status: - Maintain cuffless 4 trach for now, do not decannulate and do not downsize any further - No decannulation given mental status  Dysphagia: - Per SLP  Disposition / Summary of Today's Plan 01/02/18   Awaiting SNF placement.  Discussed with PCCM-NP    Diet: TF (bolsu) Pain/Anxiety/Delirium protocol: None DVT prophylaxis: SCD's GI prophylaxis: pepcid  Hyperglycemia protocol: None Mobility: BR Code Status: full Family Communication: 9/30 spoke with son, reviewed patients overall state of health.  He is very articulate in his understanding of her illness.    Labs   CBC: Recent Labs  Lab 12/28/17 0435  01/01/18 0425  WBC 7.9 7.8  HGB 9.8* 10.5*  HCT 33.3* 34.4*  MCV 83.5 83.9  PLT 273 246   Basic Metabolic Panel: Recent Labs  Lab 12/28/17 0435 01/01/18 0425 01/02/18 0410  NA 139 140 139  K 3.8 3.5 3.7  CL 103 107 105  CO2 24 22 23   GLUCOSE 102* 143* 98  BUN 14 12 10   CREATININE 0.74 1.48* 0.71  CALCIUM 9.8 9.7 9.6   GFR: Estimated Creatinine Clearance: 79.3 mL/min (by C-G formula based on SCr of 0.71 mg/dL). Recent Labs  Lab 12/28/17 0435 01/01/18 0425  WBC 7.9 7.8   Liver Function Tests: Recent Labs  Lab 12/28/17 0435  AST 19  ALT 23  ALKPHOS 99  BILITOT 0.6  PROT 7.4  ALBUMIN 3.2*   No results for input(s): LIPASE, AMYLASE in the last 168 hours. No results for input(s): AMMONIA in the last 168 hours. ABG    Component Value Date/Time   PHART 7.444 12/08/2017 0414   PCO2ART 36.7 12/08/2017 0414   PO2ART 109 (H) 12/08/2017 0414   HCO3 24.6 12/08/2017 0414   TCO2 27 12/05/2017 1216   ACIDBASEDEF 6.0 (H) 11/29/2017 0310   O2SAT 98.2 12/08/2017 0414    Coagulation Profile: No results for input(s): INR, PROTIME in the last 168 hours. Cardiac Enzymes: No results for input(s): CKTOTAL, CKMB, CKMBINDEX,  TROPONINI in the last 168 hours. HbA1C: Hgb A1c MFr Bld  Date/Time Value Ref Range Status  11/28/2017 08:32 AM 5.8 (H) 4.8 - 5.6 % Final    Comment:    (NOTE) Pre diabetes:          5.7%-6.4% Diabetes:              >6.4% Glycemic control for   <7.0% adults with diabetes    CBG: Recent Labs  Lab 12/30/17 0739 12/30/17 1151 12/30/17 1618 12/31/17 1216 12/31/17 1635  GLUCAP 99 97 126* 159* 89   Alyson Reedy, M.D. [facility] Pulmonary/Critical Care Medicine. Pager: [phone]. After hours pager: [phone].

## 2018-01-03 NOTE — Progress Notes (Addendum)
Physical Therapy Treatment Patient Details Name: Tammie Miles MRN: [id number] DOB: [birth year] Today's Date: 01/03/2018    History of Present Illness Patient is a 42 y/o female who was admitted for AMS/unresponsiveness. 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 Returned to OR 9/20 for decompressive crani with left skull harvested in abdomen. 9/25 developed seizures. Intubated 9/19-10/7. s/p trach and peg 10/1. PMH includes HTN, depression, anxiety.     PT Comments    Pt performed functional mobility to edge of bed, performing transfer into standing and progressing gait training.  Pt continues to present with R UE weakness but able to move RLE ( proximal ) toward edge of bed.  Pt utilized LUE with cues to progress RUE and RLE to edge of bed.  Pt continues to require use of R hand splint on RW to progress gait.  Pt fatigued post session but followed commands to give patient a high five at the end of the session.  Plan at this time for SNF remains appropriate if she continues to progress CIR may be most appropriate for recovery.  Will continue to assess patient during visit for appropriateness of CIR.    Follow Up Recommendations  SNF     Equipment Recommendations  None recommended by PT    Recommendations for Other Services       Precautions / Restrictions Precautions Precautions: Fall(fell 10/23) Precaution Comments: trach collar, PEG, no bone flap L skull Required Braces or Orthoses: Other Brace/Splint Other Brace/Splint: Bilateral PRAFO Boots In bed only please apply ace wrap on top to secure as they are size too big to fit appropriately,  HELMET TO BE WORN OOB AT ALL TIMES, CAN BE OFF IN BED.   Restrictions Weight Bearing Restrictions: No    Mobility  Bed Mobility Overal bed mobility: Needs Assistance Bed Mobility:  Rolling;Sidelying to Sit Rolling: Mod assist;Min assist(Min to roll R and and mod assistance to roll L.  ) Sidelying to sit: Mod assist Supine to sit: Mod assist;+2 for physical assistance Sit to supine: Max assist   General bed mobility comments: Pt required increased time and cues to self assist R UE/LE to edge of bed,  Pt able to follow commands but ultimately she required assistance to move to edge of bed with PTA assisting R side.  Pt also required assistance to scoot R hip out to edge of bed.    Transfers Overall transfer level: Needs assistance Equipment used: Rolling walker (2 wheeled)(R hand splint on RW.  Pt able to remove velco strap during transfer back to bed with required total assistance to place R hand on walker splint and place strap for placement during transfers.  ) Transfers: Sit to/from Stand Sit to Stand: Mod assist;+2 physical assistance         General transfer comment: Cues for hand placement to push from seated surface.  Pt is slow and guarded.  Utilizing L side to push with lateral trunk movement and minor rotation of trunk to come to standing.    Ambulation/Gait Ambulation/Gait assistance: Mod assist Gait Distance (Feet): 30 Feet(+ 10 ft) Assistive device: Rolling walker (2 wheeled) Gait Pattern/deviations: Step-through pattern;Decreased stride length;Shuffle;Decreased step length - right;Trunk flexed;Narrow base of support;Scissoring     General Gait Details: Pt continues to with hemiparetic gait.  Pt required assistance to advance R LE forward, and to weight shift  L during R swing phase. Pt is slow and required max VCs to wait for RLE to move forward before stepping with LLE.  Pt with scissoring during steps forward L over R.  Required cues to increase BOS.  Pt performed turning with assistance to move RW and maintain balance.  LOB x2 during gait training.     Stairs             Wheelchair Mobility    Modified Rankin (Stroke Patients  Only) Modified Rankin (Stroke Patients Only) Pre-Morbid Rankin Score: No symptoms Modified Rankin: Moderately severe disability     Balance Overall balance assessment: Needs assistance Sitting-balance support: Feet supported;Single extremity supported Sitting balance-Leahy Scale: Fair Sitting balance - Comments: min guard sitting EOB without any further challenge holding to rail with L hand     Standing balance-Leahy Scale: Poor Standing balance comment: reliant on UE support/external assist from therapist, Remains to lose balance to the R.                              Cognition Arousal/Alertness: Awake/alert Behavior During Therapy: Flat affect Overall Cognitive Status: Difficult to assess                                 General Comments: Pt able to follow commands with increased time.        Exercises      General Comments        Pertinent Vitals/Pain Pain Assessment: Faces Faces Pain Scale: No hurt    Home Living                      Prior Function            PT Goals (current goals can now be found in the care plan section) Acute Rehab PT Goals Patient Stated Goal: unable to participate with goals Potential to Achieve Goals: Fair Additional Goals Additional Goal #1: Pt will show purposeful movement to v/t cuing. Progress towards PT goals: Progressing toward goals    Frequency    Min 3X/week      PT Plan Current plan remains appropriate    Co-evaluation              AM-PAC PT "6 Clicks" Daily Activity  Outcome Measure  Difficulty turning over in bed (including adjusting bedclothes, sheets and blankets)?: Unable Difficulty moving from lying on back to sitting on the side of the bed? : Unable Difficulty sitting down on and standing up from a chair with arms (e.g., wheelchair, bedside commode, etc,.)?: Unable Help needed moving to and from a bed to chair (including a wheelchair)?: A Lot Help needed walking  in hospital room?: A Lot Help needed climbing 3-5 steps with a railing? : Total 6 Click Score: 8    End of Session Equipment Utilized During Treatment: Gait belt Activity Tolerance: Patient tolerated treatment well Patient left: in chair;with call bell/phone within reach;with chair alarm set Nurse Communication: Mobility status PT Visit Diagnosis: Other abnormalities of gait and mobility (R26.89);Muscle weakness (generalized) (M62.81);Hemiplegia and hemiparesis Hemiplegia - Right/Left: Right Hemiplegia - caused by: Nontraumatic intracerebral hemorrhage     Time: 0936-1010 PT Time Calculation (min) (ACUTE ONLY): 34 min  Charges:  $Gait Training: 8-22 mins $Therapeutic Activity: 8-22 mins  Joycelyn Rua, PTA Acute Rehabilitation Services Pager [phone] Office [phone]     Florestine Avers 01/03/2018, 10:51 AM

## 2018-01-03 NOTE — Progress Notes (Signed)
  STROKE TEAM - DAILY PROGRESS NOTE   SUBJECTIVE No acute event overnight. No family at bedside. Pending SNF. Alert,non verbal, tried to answer question. Talked to son today, he wanted an update on recent imaging and there have been none she is stable and pending SNF. He also asked for 42 year old daughter to visit, we discussed with the Charge Nurse and she will discuss with family.   OBJECTIVE Most recent Vital Signs: Vitals:   01/02/18 2345 01/03/18 0349 01/03/18 0443 01/03/18 0736  BP:   (!) 138/97 115/73  Pulse: 99 97 94 87  Resp: 20 17 18 16  Temp:   99 F (37.2 C) 98.3 F (36.8 C)  TempSrc:   Oral Oral  SpO2: 96% 97% 92% 97%  Weight:      Height:       CBG (last 3)  Recent Labs    12/31/17 1216 12/31/17 1635  GLUCAP 159* 89     CBC Latest Ref Rng & Units 01/01/2018 12/28/2017 12/22/2017  WBC 4.0 - 10.5 K/uL 7.8 7.9 9.6  Hemoglobin 12.0 - 15.0 g/dL 10.5(L) 9.8(L) 9.3(L)  Hematocrit 36.0 - 46.0 % 34.4(L) 33.3(L) 31.9(L)  Platelets 150 - 400 K/uL 246 273 385    BMP Latest Ref Rng & Units 01/02/2018 01/01/2018 12/28/2017  Glucose 70 - 99 mg/dL 98 143(H) 102(H)  BUN 6 - 20 mg/dL 10 12 14  Creatinine 0.44 - 1.00 mg/dL 0.71 1.48(H) 0.74  Sodium 135 - 145 mmol/L 139 140 139  Potassium 3.5 - 5.1 mmol/L 3.7 3.5 3.8  Chloride 98 - 111 mmol/L 105 107 103  CO2 22 - 32 mmol/L 23 22 24  Calcium 8.9 - 10.3 mg/dL 9.6 9.7 9.8     Physical Exam no change in exam HEENT-status post left frontoparietal craniotomy with wound clean dry, soft to touch, L cranial edema decreasing Cardiovascular- S1-S2 audible, pulses palpable throughout   Lungs- breath sounds clear. Status post tracheostomy with trach collar, nonlabored breathing. Spontaneously cough w/ clear sputum Abdomen- bowel sounds present, RLQ wound CDI musculoskeletal-no joint tenderness, deformity or swelling Skin-warm and dry  Neurological Exam:  Awake, alert, no speech  output. More socially interactive, follows examiner and tries to answer questions. Will raise L arm to command, hold/release examiner's hand to command. Does not follow commands on the right.  Attempts to smile and speak. Eyes open, EOMI, PERRL, mild left ptosis. Not blinking to visual threat on the right, but able to track examiner on the right. Right facial droop. Tongue protrusion midline. LUE 5/5 and able to hold against gravity. LLE 5/5. RUE 2/5 on pain stimuli and RLE 2+/5 to pain. R sided tone. B/l babinski muted. Sensation, coordination and gait not otherwise tested.  No significant change from prior notes' exams.    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 15 mm of left-to-right midline shift.  MRI of the brain: Post craniotomy 8-9 mm of right-to-left midline shift with mild left uncal herniation similar to previous. Underlying evolving large acute ischemic left MCA territory infarct involving the majority of the left MCA territory.    Repeat Head CT post evacuation 9/20 - Left MCA territory infarct with evacuated hematoma. Midline shift measures 8 mm.   UDS - positive for amphetamines ( Pt takes Adderal at home)  2D Echocardiogram EF 60 to 65%  LE venous doppler negative for DVT  Hypercoagulable labs negative except high ESR and CRP  LDL 64  HgbA1c 5.8  VTE: Heparin subq   On ASA 81 for stroke prevention  Therapy recommendations: SNF  Disposition: Pending   Helmet at bedside. To wear when OOB or at risk for fall  Fall risk  Has low bed  Soft Helmet for use when at  risk for fall  L arm restrained. L mitten placed on  Acute respiratory insufficiency in the setting of massive IPH s/p trach  Status post tracheostomy on 12/09/17  Doing well on trach collar  Repetitively pulls/attempts to pull out trach, mitt placed on  Has #4 shiley/cuffless.   PMV underway w/ SLP  Per CCM, not a candidate for decannulation at the point but may be in the future  Seizure  Episode of stiffness, tongue biting on 12/03/17  Overnight EEG no seizure  Increased keppra from 500 to 1000mg bid  No more seizure since  Seizure precautions  Hypertension emergency  Stable  BP elevated on admission  On norvasc 10,  Labetalol 300 tid  Off minoxidil and HCTZ, lisinopril 20 bid  BP improved on increased labetalol (decreased to 200 Sat and increased back to 300 Wed)  Stopped lisinopril 10/24 d/t increased creatinine  BP goal normotensive  Anemia  Hb 11.8 on admission  Hemoglobin 7.3-->8.0->8.3->8.5->8.2 -> 9.3-> 9.8->10.5 improving  Received one unit PRBC during this admission    Continue to monitor  Iron study iron 36 and ferritin 66  Continue  iron pills   Dysphagia, secondary to stroke  PEG tube placed on 12/09/2017 for tube feeding  On free water  On bolus tube feeds  Total protein 7.6 (9/19) -> 7.4 ; Albumin 3.8 -> 3.2  Now on D1 thin liquid diet and tolerating  Calorie count underway  SLP/RD w/ plans to slowly decrease TF per RN   Other Stroke Risk Factors  ETOH use   Other Active Problems  ADHD - on Adderell at home  Anxiety  Depression  Hypokalemia, resolved   Restrained, trying to pull ou trach. May consider a sitter instead of restraints as SNF may not accept if she has been restrained for the last 24 hours.  Hospital day # 37   Personally examined patient and images, and have participated in and made any corrections needed to history, physical, neuro exam,assessment and plan as stated above.  I have personally  obtained the history, evaluated lab date, reviewed imaging studies and agree with radiology interpretations.    Antonia Ahern, MD Stroke Neurology   To contact Stroke Continuity provider, please refer to [email or web address]. After hours, contact General Neurology  

## 2018-01-04 LAB — BASIC METABOLIC PANEL
Anion gap: 10 (ref 5–15)
BUN: 14 mg/dL (ref 6–20)
CALCIUM: 9.8 mg/dL (ref 8.9–10.3)
CHLORIDE: 105 mmol/L (ref 98–111)
CO2: 24 mmol/L (ref 22–32)
Creatinine, Ser: 0.82 mg/dL (ref 0.44–1.00)
GFR calc Af Amer: >60 mL/min (ref >60)
GFR calc non Af Amer: >60 mL/min (ref >60)
Glucose, Bld: 108 mg/dL — ABNORMAL HIGH (ref 70–99)
Potassium: 4.1 mmol/L (ref 3.5–5.1)
SODIUM: 139 mmol/L (ref 135–145)

## 2018-01-04 LAB — CBC
HCT: 35.5 % — ABNORMAL LOW (ref 36.0–46.0)
HEMOGLOBIN: 10.6 g/dL — AB (ref 12.0–15.0)
MCH: 25.2 pg — AB (ref 26.0–34.0)
MCHC: 29.9 g/dL — ABNORMAL LOW (ref 30.0–36.0)
MCV: 84.3 fL (ref 80.0–100.0)
NRBC: 0 % (ref 0.0–0.2)
Platelets: 285 10*3/uL (ref 150–400)
RBC: 4.21 MIL/uL (ref 3.87–5.11)
RDW: 21.4 % — ABNORMAL HIGH (ref 11.5–15.5)
WBC: 7.3 10*3/uL (ref 4.0–10.5)

## 2018-01-04 MED ORDER — CHLORHEXIDINE GLUCONATE 0.12 % MT SOLN
OROMUCOSAL | Status: AC
  Filled 2018-01-04: qty 15

## 2018-01-04 NOTE — Progress Notes (Signed)
STROKE TEAM - DAILY PROGRESS NOTE   SUBJECTIVE No acute event overnight. No family at bedside. Pending SNF. Alert,non verbal, tried to answer question. Talked to father of her child yesterday, he wanted an update on recent imaging and there have been none she is stable and pending SNF. He also asked for 42 year old daughter to visit, we discussed with the Charge Nurse and she will discuss with family. No changes today. She is on restraints, this may interfere with going to SNF however spoke to social worker yesterday and no beds available at this time.  OBJECTIVE Most recent Vital Signs: Vitals:   01/03/18 2343 01/04/18 0336 01/04/18 0358 01/04/18 0514  BP: 134/77  (!) 132/91 135/90  Pulse: 92 89 90 88  Resp: _0 Temp: 98.5 F (36.9 C)  99.8 F (37.7 C)   TempSrc: Oral  Oral   SpO2:  98% 98%   Weight:   66.5 kg   Height:       CBG (last 3)  No results for input(s): GLUCAP in the last 72 hours.   CBC Latest Ref Rng & Units 01/01/2018 12/28/2017 12/22/2017  WBC 4.0 - 10.5 K/uL 7.8 7.9 9.6  Hemoglobin 12.0 - 15.0 g/dL 10.5(L) 9.8(L) 9.3(L)  Hematocrit 36.0 - 46.0 % 34.4(L) 33.3(L) 31.9(L)  Platelets 150 - 400 K/uL 246 273 385    BMP Latest Ref Rng & Units 01/02/2018 01/01/2018 12/28/2017  Glucose 70 - 99 mg/dL 98 143(H) 102(H)  BUN 6 - 20 mg/dL _1 Creatinine 0.44 - 1.00 mg/dL 0.71 1.48(H) 0.74  Sodium 135 - 145 mmol/L 139 140 139  Potassium 3.5 - 5.1 mmol/L 3.7 3.5 3.8  Chloride 98 - 111 mmol/L 105 107 103  CO2 22 - 32 mmol/L _2 Calcium 8.9 - 10.3 mg/dL 9.6 9.7 9.8     Physical Exam no change in exam HEENT-status post left frontoparietal craniotomy with wound clean dry Cardiovascular- S1-S2, RRR Skin-warm and dry  Neurological Exam:  Awake, alert, globally aphasic. Tracks examiner and tries to answer questions. Will raise L arm to command, hold/release examiner's hand to command and follow other simple  commands. Does not follow commands on the right, no spontaneous movement on the right.  Attempts to smile and speak. Eyes open, EOMI, PERRL, mild left ptosis. Not blinking to visual threat on the right, but able to track examiner on the right. Right facial droop. Tongue protrusion midline. LUE 5/5 and able to hold against gravity. LLE 5/5. RUE and RLE withdraws to pain but cannot hold antigravity. Dec R sided tone. coordination and gait not otherwise tested.  No significant change from prior notes' exams.    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 15 mm of left-to-right midline shift.  MRI of the brain: Post craniotomy 8-9 mm of right-to-left midline shift with mild left uncal herniation similar to previous. Underlying evolving large acute ischemic left MCA territory infarct involving the majority of the left MCA territory.  Repeat Head CT post evacuation 9/20 - Left MCA territory infarct with evacuated  hematoma. Midline shift measures 8 mm.   UDS - positive for amphetamines ( Pt takes Adderal at home)  2D Echocardiogram EF 60 to 65%  LE venous doppler negative for DVT  Hypercoagulable labs negative except high ESR and CRP  LDL 64  HgbA1c 5.8  VTE: Heparin subq   On ASA 81 for stroke prevention  Therapy recommendations: SNF, no beds available, may need to monitor restraints as SBF may not take her if she has been rstrained within 24 hours. At this time no beds available  Disposition: Pending   Helmet at bedside. To wear when OOB or at risk for fall  Fall risk  Has low  bed  Soft Helmet for use when at risk for fall  L arm restrained. L mitten placed on as she continues to try and pull out her trach.  Acute respiratory insufficiency in the setting of massive IPH s/p trach  Status post tracheostomy on 12/09/17  Doing well on trach collar  Repetitively pulls/attempts to pull out trach, mitt placed on and restraint  Has #4 shiley/cuffless.   PMV underway w/ SLP  Per CCM, not a candidate for decannulation at the point but may be in the future  Seizure  Episode of stiffness, tongue biting on 12/03/17  Overnight EEG no seizure  Increased keppra from 500 to 1072m bid  No more seizure since  Seizure precautions  Hypertension emergency  Stable  BP elevated on admission  On norvasc 10,  Labetalol 300 tid  Off minoxidil and HCTZ, lisinopril 20 bid  BP improved on increased labetalol (decreased to 200 Sat and increased back to 300 Wed)  Stopped lisinopril 10/24 d/t increased creatinine  BP goal normotensive  Anemia  Hb 11.8 on admission  Hemoglobin 7.3-->8.0->8.3->8.5->8.2 -> 9.3-> 9.8->10.5 improving  Received one unit PRBC during this admission    Continue to monitor  Iron study iron 36 and ferritin 66  Continue  iron pills   Creatinine 1.48 today. Unclear etiology. Repeat BMP.   Repeat CBC  Dysphagia, secondary to stroke  PEG tube placed on 12/09/2017 for tube feeding  On free water  On bolus tube feeds  Total protein 7.6 (9/19) -> 7.4 ; Albumin 3.8 -> 3.2  Now on D1 thin liquid diet and tolerating  Calorie count underway  SLP/RD w/ plans to slowly decrease TF per RN   Other Stroke Risk Factors  ETOH use   Other Active Problems  ADHD - on Adderell at home  Anxiety  Depression  Hypokalemia, resolved   Restrained, trying to pull ou trach. May consider a sitter instead of restraints as SNF may not accept if she has been restrained for the last 24 hours.  Hospital day # 344  Personally examined  patient and images, and have participated in and made any corrections needed to history, physical, neuro exam,assessment and plan as stated above.  I have personally obtained the history, evaluated lab date, reviewed imaging studies and agree with radiology interpretations.       To contact Stroke Continuity provider, please refer to [email or web address] After hours, contact General Neurology

## 2018-01-05 DIAGNOSIS — J9611 Chronic respiratory failure with hypoxia: Secondary | ICD-10-CM

## 2018-01-05 NOTE — Progress Notes (Signed)
STROKE TEAM - DAILY PROGRESS NOTE   SUBJECTIVE No acute event overnight. No family at bedside. Tammie Miles verbal,   she is stable and pending SNF. Marland Kitchen No changes today. She is on restraints, this may interfere with going to SNF however Dr Jaynee Eagles spoke to social worker yesterday and no beds available at this time.  OBJECTIVE Most recent Vital Signs: Vitals:   01/05/18 1151 01/05/18 1218 01/05/18 1553 01/05/18 1656  BP:  132/88 131/85 108/73  Pulse: 88 91 89 (!) 222  Resp: 20 18 20 18   Temp:  (!) 97.3 F (36.3 C)    TempSrc:  Oral    SpO2: 97% 97% 97% 99%  Weight:      Height:       CBG (last 3)  No results for input(s): GLUCAP in the last 72 hours.   CBC Latest Ref Rng & Units 01/04/2018 01/01/2018 12/28/2017  WBC 4.0 - 10.5 K/uL 7.3 7.8 7.9  Hemoglobin 12.0 - 15.0 g/dL 10.6(L) 10.5(L) 9.8(L)  Hematocrit 36.0 - 46.0 % 35.5(L) 34.4(L) 33.3(L)  Platelets 150 - 400 K/uL 285 246 273    BMP Latest Ref Rng & Units 01/04/2018 01/02/2018 01/01/2018  Glucose 70 - 99 mg/dL 108(H) 98 143(H)  BUN 6 - 20 mg/dL 14 10 12   Creatinine 0.44 - 1.00 mg/dL 0.82 0.71 1.48(H)  Sodium 135 - 145 mmol/L 139 139 140  Potassium 3.5 - 5.1 mmol/L 4.1 3.7 3.5  Chloride 98 - 111 mmol/L 105 105 107  CO2 22 - 32 mmol/L 24 23 22   Calcium 8.9 - 10.3 mg/dL 9.8 9.6 9.7     Physical Exam no change in exam HEENT-status post left frontoparietal craniotomy with wound clean dry Cardiovascular- S1-S2, RRR Skin-warm and dry  Neurological Exam:  Awake, alert, globally aphasic. Tracks examiner and tries to answer questions. Will raise L arm to command, hold/release examiner's hand to command and follow other simple commands. Does not follow commands on the right, no spontaneous movement on the right.  Attempts to smile and speak. Eyes open, EOMI, PERRL, mild left ptosis. Not blinking to visual threat on the right, but able to track examiner on the right. Right facial  droop. Tongue protrusion midline. LUE 5/5 and able to hold against gravity. LLE 5/5. RUE and RLE withdraws to pain but cannot hold antigravity. Dec R sided tone. coordination and gait not otherwise tested.  No significant change from prior notes' exams.    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 15 mm of left-to-right midline shift.  MRI of the brain: Post craniotomy 8-9 mm of right-to-left midline shift with mild left uncal herniation similar to previous. Underlying evolving large acute ischemic left MCA territory infarct involving the majority of the left MCA territory.  Repeat Head CT post evacuation 9/20 - Left MCA territory infarct with evacuated hematoma. Midline shift measures 8 mm.   UDS - positive for amphetamines ( Pt takes Adderal at home)  2D Echocardiogram EF 60 to 65%  LE venous doppler negative for DVT  Hypercoagulable labs negative except high ESR and CRP  LDL  64  HgbA1c 5.8  VTE: Heparin subq   On ASA 81 for stroke prevention  Therapy recommendations: SNF, no beds available, may need to monitor restraints as SBF may not take her if she has been rstrained within 24 hours. At this time no beds available  Disposition: Pending   Helmet at bedside. To wear when OOB or at risk for fall  Fall risk  Has low bed  Soft Helmet for use when at risk for fall  L arm restrained. L mitten placed on as she continues to try and pull out her trach.  Acute respiratory insufficiency in the setting of massive IPH s/p trach  Status post tracheostomy on 12/09/17  Doing well  on trach collar  Repetitively pulls/attempts to pull out trach, mitt placed on and restraint  Has #4 shiley/cuffless.   PMV underway w/ SLP  Per CCM, not a candidate for decannulation at the point but may be in the future  Seizure  Episode of stiffness, tongue biting on 12/03/17  Overnight EEG no seizure  Increased keppra from 500 to 1059m bid  No more seizure since  Seizure precautions  Hypertension emergency  Stable  BP elevated on admission  On norvasc 10,  Labetalol 300 tid  Off minoxidil and HCTZ, lisinopril 20 bid  BP improved on increased labetalol (decreased to 200 Sat and increased back to 300 Wed)  Stopped lisinopril 10/24 d/t increased creatinine  BP goal normotensive  Anemia  Hb 11.8 on admission  Hemoglobin 7.3-->8.0->8.3->8.5->8.2 -> 9.3-> 9.8->10.5 improving  Received one unit PRBC during this admission    Continue to monitor  Iron study iron 36 and ferritin 66  Continue  iron pills   Creatinine 1.48 today. Unclear etiology. Repeat BMP.   Repeat CBC  Dysphagia, secondary to stroke  PEG tube placed on 12/09/2017 for tube feeding  On free water  On bolus tube feeds  Total protein 7.6 (9/19) -> 7.4 ; Albumin 3.8 -> 3.2  Now on D1 thin liquid diet and tolerating  Calorie count underway  SLP/RD w/ plans to slowly decrease TF per RN   Other Stroke Risk Factors  ETOH use   Other Active Problems  ADHD - on Adderell at home  Anxiety  Depression  Hypokalemia, resolved   Restrained, trying to pull ou trach. May consider a sitter instead of restraints as SNF may not accept if she has been restrained for the last 24 hours.  Hospital day # 336 Awake yet skilled nursing bed. Patient medically stable for transfer when bed is available PAntony Contras MD Medical Director MMasonPager: [phone]/28/2019 5:33 PM    To contact Stroke Continuity provider, please refer to [email or web address] After hours, contact  General Neurology

## 2018-01-05 NOTE — Progress Notes (Addendum)
NAME:  Tammie Miles, MRN:  [id number], DOB:  [birth year], LOS: 39 ADMISSION DATE:  11/27/2017, CONSULTATION DATE:  11/27/2017 REFERRING MD:  Dr. Conchita Paris CHIEF COMPLAINT:  Left frontal hemorrhage  Brief History   42 yoF admitted 9/19 with AMS that began the evening of 9/18, initially thought due to intoxication. Remained unresponsive through am of 9/19.  Patient was brought to the ED where a CT was done and showed a large left sided ICH. Patient was taken to the OR and a mini craniectomy was done.  Patient was brought out to the ICU and PCCM was consulted for vent management. Course complicated by cerebral edema requiring 3% saline limited by hypernatremia. Also complicated by encephalopathy. Tolerating vent wean, but mental status limited extubation. Then 9/25 she developed what appeared to be seizures with gaze deviation and tongue biting. EEG done overnight with results pending and AEDs adjusted. No further seizure seen.   Significant Hospital Events   09/19 Admit/ OR- mini crani 09/20 Decompressive craniectomy ( L)  with skull harvesting in the abdomen 09/21 hypertonic saline started 09/23 hypertonic stopped.  09/25 Seizure. EEG done with no sz activity. AEDs adjusted. 10/01 Trach / PEG 10/02 Trach collar  10/28  Room air, #4 cuffless    Consults: date of consult/date signed off & final recs  9/19 NSGY 9/19 PCCM   Procedures (surgical and bedside):  9/19 Intubated 9/19 OR for crani, evac of hematoma>> no change in neuro status post op 9/20 Decompressive craniectomy ( L)  with skull harvesting in the abdomen  Significant Diagnostic Tests:  9/19 [facility]  Large area of hemorrhage in the left basal ganglia and left frontal lobe, 5.8 x 3.5 cm. 15 mm of left-to-right midline shift. 9/19 CTA head: Positive for left MCA M1 large vessel occlusion, and also severe stenosis at the left ICA terminus, superimposed on the relatively large acute left hemisphere intra-axial hemorrhage (estimated blood  volume 43 mL). Vasospasm suspected in the proximal ACAs. 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 2. Negative for intracranial aneurysm, CTA spot sign, or evidence of vascular malformation in association with the acute hemorrhage. 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 3. Stable intracranial mass effect since 0938 hours today, with 16 mm of leftward midline shift and trapping of the right lateral ventricle. 11/29/2017 CT Head Large left MCA infarct is stable in distribution. Interval left frontal craniectomy with mild herniation of left frontal lobe through the defect. Resolved uncal herniation, improved patency of left lateral ventricle, and 6 mm left-to-right midline shift, previously 8 mm. Partial interval dispersion of acute hemorrhage in the left basal ganglia. Subcentimeter density anterior to the prior position of blood products possibly representing retracted clot or interval hemorrhage, attention at follow-up recommended. CT 9/24: 1. Left MCA distribution infarction is stable in distribution. Mild increased edema of the infarct with increased herniation of the left frontal lobe via the left frontal craniectomy. 2. Stable 6 mm left-to-right midline shift and partial effacement of left lateral ventricle. 3. Stable small hematoma within the left basal ganglia. 4. No new acute intracranial abnormality identified.  Micro Data: 9/19 MRSA PCR >> Negative 9/19 Sputum Culture >> normal flora 9/22 Blood x 2 >> negative 9/22 UA >> Normal 9/22 Sputum >> few candida dubliniensis 12/13/2017 blood cultures x2>> negative 12/13/2017 urine culture>> negative 12/13/2017 sputum culture>> Citrobacter  Antimicrobials:  9/19 Cefazolin (pre-op) 9/20  Subjective:  No acute events.  Pt tolerated room air, #4 cuffless  trach.  SLP following.   Objective   Blood pressure 132/88, pulse 91, temperature (!) 97.3 F (36.3 C), temperature source Oral, resp. rate 18, height 5\' 1"  (1.549 m), weight 67.3 kg, SpO2 97 %.    FiO2 (%):  [28 %] 28 %   Intake/Output Summary (Last 24 hours) at 01/05/2018 1432 Last data filed at 01/05/2018 0900 Gross per 24 hour  Intake 750 ml  Output 3 ml  Net 747 ml   Filed Weights   01/01/18 0500 01/04/18 0358 01/05/18 0341  Weight: 65.3 kg 66.5 kg 67.3 kg    Intake/Output Summary (Last 24 hours) at 01/05/2018 1432 Last data filed at 01/05/2018 0900 Gross per 24 hour  Intake 750 ml  Output 3 ml  Net 747 ml     Examination:  General: young adult female lying in bed in NAD HEENT: MM pink/moist, #4 trach midline c/d/i, strong cough  Neuro: Awake, alert, tracks provider, moves left side spontaneously CV: s1s2 rrr, no m/r/g PULM: even/non-labored, lungs bilaterally clear  UJ:WJXB, non-tender, bsx4 active  Extremities: warm/dry, no edema  Skin: no rashes or lesions.  Multiple tattoos  Resolved Hospital Problem list     Assessment & Plan:  42 year old female s/p crani for MCA CVA now with a trach  Acute respiratory insufficiency in the setting of massive IPH - s/p trach placement 10/1. P: Continue #4 cuffless trach  Room air as tolerated  Might need humidity for airway moisture  PMV trials per SLP   Hypoxemia - resolved P: O2 as needed to keep sats >90%  Tracheostomy Status P: Trach care per protocol  Would not de-cannulate given mental status   Dysphagia: P: Per primary, SLP following   Disposition / Summary of Today's Plan 01/05/18   Awaiting SNF placement.  PCCM will follow once per week.  Please call sooner if new needs arise.     Diet: per primary DVT prophylaxis: SCD's GI prophylaxis: pepcid  Hyperglycemia protocol: None Mobility: BR Code Status: full Family Communication: No family available 10/28.  Son is primary Management consultant (early  20's).    Labs   CBC: Recent Labs  Lab 01/01/18 0425 01/04/18 1434  WBC 7.8 7.3  HGB 10.5* 10.6*  HCT 34.4* 35.5*  MCV 83.9 84.3  PLT 246 285   Basic Metabolic Panel: Recent Labs  Lab 01/01/18 0425 01/02/18 0410 01/04/18 1434  NA 140 139 139  K 3.5 3.7 4.1  CL 107 105 105  CO2 22 23 24   GLUCOSE 143* 98 108*  BUN 12 10 14   CREATININE 1.48* 0.71 0.82  CALCIUM 9.7 9.6 9.8   GFR: Estimated Creatinine Clearance: 78.4 mL/min (by C-G formula based on SCr of 0.82 mg/dL). Recent Labs  Lab 01/01/18 0425 01/04/18 1434  WBC 7.8 7.3   Liver Function Tests: No results for input(s): AST, ALT, ALKPHOS, BILITOT, PROT, ALBUMIN in the last 168 hours. No results for input(s): LIPASE, AMYLASE in the last 168 hours. No results for input(s): AMMONIA in the last 168 hours. ABG    Component Value Date/Time   PHART 7.444 12/08/2017 0414   PCO2ART 36.7 12/08/2017 0414   PO2ART 109 (H) 12/08/2017 0414   HCO3 24.6 12/08/2017 0414   TCO2 27 12/05/2017 1216   ACIDBASEDEF 6.0 (H) 11/29/2017 0310   O2SAT 98.2 12/08/2017 0414    Coagulation Profile: No results for input(s): INR, PROTIME  in the last 168 hours.   Cardiac Enzymes: No results for input(s): CKTOTAL, CKMB, CKMBINDEX, TROPONINI in the last 168 hours.   HbA1C: Hgb A1c MFr Bld  Date/Time Value Ref Range Status  11/28/2017 08:32 AM 5.8 (H) 4.8 - 5.6 % Final    Comment:    (NOTE) Pre diabetes:          5.7%-6.4% Diabetes:              >6.4% Glycemic control for   <7.0% adults with diabetes    CBG: Recent Labs  Lab 12/30/17 0739 12/30/17 1151 12/30/17 1618 12/31/17 1216 12/31/17 1635  GLUCAP 99 97 126* 159* 89   Canary Brim, NP-C [facility] Pgr: [phone] or if no answer [phone] 01/05/2018, 2:32 PM

## 2018-01-05 NOTE — Progress Notes (Signed)
  Speech Language Pathology Treatment: Cognitive-Linquistic  Patient Details Name: Tammie Miles MRN: [id number] DOB: Dec 07, 1975 Today's Date: 01/05/2018 Time: 4098-1191 SLP Time Calculation (min) (ACUTE ONLY): 12 min  Assessment / Plan / Recommendation Clinical Impression  PMV placed for POs; right mitt removed so pt could feed herself.  There was only minimal anterior bolus loss; decreased awareness; no coughing nor any s/s of aspiration.  Pt drank Ensure through straw intermittently during session.  With valve in place, pt achieved non-purposeful voicing when yawning.  Despite visual/tactile/verbal cues, pt could not repeat vocalization nor produce other non-speech or speech sounds. Efforts were made to induce speech through singing, humming, accompanied by tactile rhythm.  Pt did not follow verbal commands, but was able to imitate gesture when model provided (making fist, opening hand, directional movements of LUE/hand). Without instruction, she managed to put her left mitt back on at end of session.  She waved goodbye in response to clinician waving upon departure.   Continue SLP for speech, swallowing, and communication. If support were available, CIR would be best option for pt upon D/C.    HPI HPI: [name] with PMH significant for HTN, anxiety, ADHD, admitted 9/19 with large L hemorrhagic basal ganglia with 15 mm shift with early herniation s/p L frontotemporal crani 9/19 for evacuation of hematoma. ETT 9/19; 9/20 returned to OR for decompressive craniectomy ; 10/1 trach/PEG; transitioned to TC and off vent 10/5. CT 9/24: 1. Left MCA distribution infarction is stable in distribution. Mild increased edema of the infarct with increased herniation of the left frontal lobe via the left frontal craniectomy. 2. Stable 6 mm left-to-right midline shift and partial effacement of left lateral ventricle. 3. Stable small hematoma within the left basal ganglia. CXR 10/9: no acute disease process. Pt trach  changed yesterday to Shiley 6 cuffless      SLP Plan  Continue with current plan of care       Recommendations         Patient may use Passy-Muir Speech Valve: Intermittently with supervision;During PO intake/meals PMSV Supervision: Full         Oral Care Recommendations: Oral care BID Follow up Recommendations: Inpatient Rehab SLP Visit Diagnosis: Cognitive communication deficit (R41.841);Dysphagia, oropharyngeal phase (R13.12) Plan: Continue with current plan of care       GO                Tammie Miles Tammie Miles 01/05/2018, 4:10 PM  Tammie Miles [name] [name], MA CCC/SLP Acute Rehabilitation Services Office number [phone] Pager [phone]

## 2018-01-05 NOTE — Progress Notes (Signed)
Occupational Therapy Treatment Patient Details Name: Tammie Miles MRN: [id number] DOB: [birth year] Today's Date: 01/05/2018    History of present illness Patient is a 42 y/o female who was admitted for AMS/unresponsiveness. 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 Returned to OR 9/20 for decompressive crani with left skull harvested in abdomen. 9/25 developed seizures. Intubated 9/19-10/7. s/p trach and peg 10/1. PMH includes HTN, depression, anxiety.    OT comments  Patient progressing well. Completing bed mobility with min assist and sit to stand using RW with min assist, increased time required with verbal and tactile cueing to attend to and progress R UE during all mobility.  Standing at sink with min assist +2 safety for oral care, seated UB dressing with min assist seated EOB.  She follow 1 step commands approximately 75% of the time. Updated frequency, goals and dc plan, believe she will best benefit from continued intensive CIR level therapies to maximize return to PLOF.  Will continue to follow.    Follow Up Recommendations  CIR;Supervision/Assistance - 24 hour    Equipment Recommendations  Other (comment)(TBD at next venue of care)    Recommendations for Other Services Rehab consult    Precautions / Restrictions Precautions Precautions: Fall(fell 10/23) Precaution Comments: trach collar, PEG, no bone flap L skull Required Braces or Orthoses: Other Brace/Splint Other Brace/Splint: Bilateral PRAFO Boots In bed only please apply ace wrap on top to secure as they are size too big to fit appropriately,  HELMET TO BE WORN OOB AT ALL TIMES, CAN BE OFF IN BED.   Restrictions Weight Bearing Restrictions: No       Mobility Bed Mobility Overal bed mobility: Needs Assistance Bed Mobility: Supine to Sit     Supine to sit: Min  assist     General bed mobility comments: increased time for management of R side, min assist for R UE but able to initate ROM of R LE towards EOB without assist   Transfers Overall transfer level: Needs assistance Equipment used: Rolling walker (2 wheeled)(R hand support ) Transfers: Sit to/from Stand Sit to Stand: Min assist;+2 safety/equipment         General transfer comment: cueing for hand placement and safety, requires assist to raise R UE to splint on RW and place velcro strap     Balance Overall balance assessment: Needs assistance Sitting-balance support: Feet supported;Single extremity supported Sitting balance-Leahy Scale: Fair Sitting balance - Comments: min guard for safety, statically   Standing balance support: During functional activity;Single extremity supported Standing balance-Leahy Scale: Poor Standing balance comment: reliant on UE support/external assist from therapist, Remains to lose balance to the R.                             ADL either performed or assessed with clinical judgement   ADL Overall ADL's : Needs assistance/impaired     Grooming: Minimal assistance;Standing;Oral care(+2 safety ) Grooming Details (indicate cue type and reason): hand over hand support to brush rather than bite sponge, 1 UE support standing              Lower Body Dressing: Total assistance;Sit to/from stand   Toilet Transfer: Minimal assistance;+2 for safety/equipment;Ambulation;RW(simulated to recliner)           Functional mobility during ADLs: Moderate assistance;Minimal assistance;+2 for  safety/equipment;Rolling walker;Cueing for safety;Cueing for sequencing(min assist for transfers, mod assist for mobility) General ADL Comments: pt demonstrates increased mobility and following commands today, requires increased time for all tasks      Vision   Additional Comments: able to visually scan to therapist commands today   Perception     Praxis       Cognition Arousal/Alertness: Awake/alert Behavior During Therapy: Flat affect;Impulsive Overall Cognitive Status: Difficult to assess Area of Impairment: Following commands;Safety/judgement;Problem solving                       Following Commands: Follows one step commands with increased time;Follows one step commands inconsistently Safety/Judgement: Decreased awareness of safety   Problem Solving: Slow processing;Difficulty sequencing;Requires verbal cues;Requires tactile cues General Comments: pt able to follow commands 75% of the time, given increased time; presents with R UE inattention        Exercises     Shoulder Instructions       General Comments      Pertinent Vitals/ Pain       Pain Assessment: Faces Faces Pain Scale: Hurts little more Pain Location: with ROM at R shoulder Pain Descriptors / Indicators: Grimacing;Discomfort Pain Intervention(s): Monitored during session;Repositioned  Home Living                                          Prior Functioning/Environment              Frequency  Min 3X/week        Progress Toward Goals  OT Goals(current goals can now be found in the care plan section)  Progress towards OT goals: Progressing toward goals  Acute Rehab OT Goals Patient Stated Goal: unable to participate with goals OT Goal Formulation: Patient unable to participate in goal setting Time For Goal Achievement: 01/19/18 Potential to Achieve Goals: Good ADL Goals Pt Will Perform Grooming: with set-up;sitting Pt Will Perform Upper Body Bathing: with min assist;sitting Pt Will Perform Upper Body Dressing: with supervision;sitting Pt Will Perform Lower Body Dressing: sit to/from stand;with supervision Pt Will Transfer to Toilet: with supervision;ambulating;bedside commode;regular height toilet  Plan Discharge plan needs to be updated;Frequency needs to be updated    Co-evaluation    PT/OT/SLP  Co-Evaluation/Treatment: Yes Reason for Co-Treatment: Complexity of the patient's impairments (multi-system involvement);For patient/therapist safety;To address functional/ADL transfers   OT goals addressed during session: ADL's and self-care;Strengthening/ROM      AM-PAC PT "6 Clicks" Daily Activity     Outcome Measure   Help from another person eating meals?: Total Help from another person taking care of personal grooming?: A Lot Help from another person toileting, which includes using toliet, bedpan, or urinal?: A Lot Help from another person bathing (including washing, rinsing, drying)?: A Lot Help from another person to put on and taking off regular upper body clothing?: A Lot Help from another person to put on and taking off regular lower body clothing?: A Lot 6 Click Score: 11    End of Session Equipment Utilized During Treatment: Rolling walker  OT Visit Diagnosis: Other abnormalities of gait and mobility (R26.89);Muscle weakness (generalized) (M62.81);Low vision, both eyes (H54.2);Other symptoms and signs involving cognitive function;Hemiplegia and hemiparesis Hemiplegia - Right/Left: Right Hemiplegia - caused by: Nontraumatic intracerebral hemorrhage   Activity Tolerance Patient tolerated treatment well   Patient Left in chair;with call bell/phone within reach;with chair  alarm set;with restraints reapplied   Nurse Communication Mobility status;Other (comment)(pt position)        Time: 1610-9604 OT Time Calculation (min): 35 min  Charges: OT General Charges $OT Visit: 1 Visit OT Treatments $Self Care/Home Management : 8-22 mins  Chancy Milroy, OT Acute Rehabilitation Services Pager [phone] Office [phone]    Chancy Milroy 01/05/2018, 10:55 AM

## 2018-01-05 NOTE — Progress Notes (Addendum)
Physical Therapy Treatment Patient Details Name: Tammie Miles MRN: [id number] DOB: April 21, 1975 Today's Date: 01/05/2018    History of Present Illness Patient is a 42 y/o female who was admitted for AMS/unresponsiveness. 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 Returned to OR 9/20 for decompressive crani with left skull harvested in abdomen. 9/25 developed seizures. Intubated 9/19-10/7. s/p trach and peg 10/1. PMH includes HTN, depression, anxiety.     PT Comments    Pt performed gait training and functional mobility during session this am.  Pt performing more purposeful movement and following commands appropriately 60% of the time.  Pt continues to present with problem solving deficits.  Pt is making great progress and will update recommendations at this time to CIR.  Will inform supervising PT of need for change in recommendations at this time.      Follow Up Recommendations  CIR;Supervision/Assistance - 24 hour     Equipment Recommendations  None recommended by PT    Recommendations for Other Services       Precautions / Restrictions Precautions Precautions: Fall(fell 10/23) Precaution Comments: trach collar, PEG, no bone flap L skull Required Braces or Orthoses: Other Brace/Splint Other Brace/Splint: Bilateral PRAFO Boots In bed only please apply ace wrap on top to secure as they are size too big to fit appropriately,  HELMET TO BE WORN OOB AT ALL TIMES, CAN BE OFF IN BED.   Restrictions Weight Bearing Restrictions: No Other Position/Activity Restrictions: PMV on during therapy patient has a cuffless trach.      Mobility  Bed Mobility Overal bed mobility: Needs Assistance Bed Mobility: Supine to Sit     Supine to sit: Min assist     General bed mobility comments: increased time for management of R side, min assist  for R UE but able to initate ROM of R LE towards EOB without assist   Transfers Overall transfer level: Needs assistance Equipment used: Rolling walker (2 wheeled)(R hand support) Transfers: Sit to/from Stand Sit to Stand: Min assist;+2 safety/equipment         General transfer comment: cueing for hand placement and safety, requires assist to raise R UE to splint on RW and place velcro strap   Ambulation/Gait Ambulation/Gait assistance: Mod assist;+2 safety/equipment Gait Distance (Feet): 45 Feet Assistive device: Rolling walker (2 wheeled) Gait Pattern/deviations: Step-through pattern;Decreased stride length;Shuffle;Decreased step length - right;Trunk flexed;Narrow base of support;Scissoring     General Gait Details: Pt continues to present with hemiparetic gait but able to progress steps on R side without assistance.  Pt performed turning with assistance to move RW and maintain balance.  Pt required cues to increase stride on R , increase BOS, and assistance to maintain alignment of RW.     Stairs             Wheelchair Mobility    Modified Rankin (Stroke Patients Only)       Balance Overall balance assessment: Needs assistance Sitting-balance support: Feet supported;Single extremity supported Sitting balance-Leahy Scale: Fair Sitting balance - Comments: min guard for safety, statically   Standing balance support: During functional activity;Single extremity supported Standing balance-Leahy Scale: Poor Standing balance comment: reliant on UE support/external assist from therapist, Remains to lose balance to the R.  Cognition Arousal/Alertness: Awake/alert Behavior During Therapy: Flat affect;Impulsive Overall Cognitive Status: Difficult to assess Area of Impairment: Following commands;Safety/judgement;Problem solving                       Following Commands: Follows one step commands with increased time;Follows  one step commands inconsistently Safety/Judgement: Decreased awareness of safety   Problem Solving: Slow processing;Difficulty sequencing;Requires verbal cues;Requires tactile cues General Comments: pt able to follow commands 75% of the time, given increased time; presents with R UE inattention      Exercises General Exercises - Lower Extremity Long Arc Quad: AAROM;Right;10 reps;Seated;Limitations Long [employer] Limitations: able to initiate movement but unable to complete ROM.   Hip Flexion/Marching: PROM;10 reps;Right;Seated    General Comments        Pertinent Vitals/Pain Pain Assessment: Faces Faces Pain Scale: Hurts little more Pain Location: with ROM at R shoulder Pain Descriptors / Indicators: Grimacing;Discomfort Pain Intervention(s): Monitored during session;Repositioned    Home Living                      Prior Function            PT Goals (current goals can now be found in the care plan section) Acute Rehab PT Goals Patient Stated Goal: unable to participate with goals PT Goal Formulation: Patient unable to participate in goal setting Potential to Achieve Goals: Fair Additional Goals Additional Goal #1: Pt will show purposeful movement to v/t cuing. Progress towards PT goals: Progressing toward goals    Frequency    Min 3X/week      PT Plan Discharge plan needs to be updated    Co-evaluation PT/OT/SLP Co-Evaluation/Treatment: Yes Reason for Co-Treatment: Complexity of the patient's impairments (multi-system involvement) PT goals addressed during session: Mobility/safety with mobility OT goals addressed during session: ADL's and self-care      AM-PAC PT "6 Clicks" Daily Activity  Outcome Measure  Difficulty turning over in bed (including adjusting bedclothes, sheets and blankets)?: Unable Difficulty moving from lying on back to sitting on the side of the bed? : Unable Difficulty sitting down on and standing up from a chair with arms  (e.g., wheelchair, bedside commode, etc,.)?: Unable Help needed moving to and from a bed to chair (including a wheelchair)?: A Little Help needed walking in hospital room?: A Lot Help needed climbing 3-5 steps with a railing? : A Lot 6 Click Score: 10    End of Session Equipment Utilized During Treatment: Gait belt Activity Tolerance: Patient tolerated treatment well Patient left: in chair;with call bell/phone within reach;with chair alarm set Nurse Communication: Mobility status PT Visit Diagnosis: Other abnormalities of gait and mobility (R26.89);Muscle weakness (generalized) (M62.81);Hemiplegia and hemiparesis Hemiplegia - Right/Left: Right Hemiplegia - caused by: Nontraumatic intracerebral hemorrhage     Time: 1610-9604 PT Time Calculation (min) (ACUTE ONLY): 37 min  Charges:  $Gait Training: 8-22 mins                     Joycelyn Rua, PTA Acute Rehabilitation Services Pager [phone] Office [phone]     Arleigh Dicola Artis Delay 01/05/2018, 12:36 PM

## 2018-01-05 NOTE — Progress Notes (Signed)
Inpatient Rehabilitation Admissions Coordinator  Asked by PT and OT to assess pt for a possible inpt rehab admit. Due to her progress with therapy. I spoke with Marisue Ivan, SW. Pt's 42 year old son is her decision maker and unable to provide assistance at this time. Pt will need long term rehab at this time due to her deficits. SNF is recommended. Please call me with any questions.  Ottie Glazier, RN, MSN Rehab Admissions Coordinator [phone] 01/05/2018 2:05 PM

## 2018-01-06 DIAGNOSIS — I611 Nontraumatic intracerebral hemorrhage in hemisphere, cortical: Secondary | ICD-10-CM

## 2018-01-06 DIAGNOSIS — J9611 Chronic respiratory failure with hypoxia: Secondary | ICD-10-CM

## 2018-01-06 MED ORDER — CHLORHEXIDINE GLUCONATE 0.12 % MT SOLN
OROMUCOSAL | Status: AC
  Administered 2018-01-06: 22:00:00
  Filled 2018-01-06: qty 15

## 2018-01-06 NOTE — Progress Notes (Signed)
Physical Therapy Treatment Patient Details Name: Tammie Miles MRN: [id number] DOB: [birth year] Today's Date: 01/06/2018    History of Present Illness Patient is a 42 y/o female who was admitted for AMS/unresponsiveness. 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 Returned to OR 9/20 for decompressive crani with left skull harvested in abdomen. 9/25 developed seizures. Intubated 9/19-10/7. s/p trach and peg 10/1. PMH includes HTN, depression, anxiety.     PT Comments    Patient's tolerance to treatment today was fairly good.  Patient continues to be functionally limited due to R side hemiparesis, poor balance, decreased strength, and decreased endurance.  Patient continues to demonstrate improved initiation of R LE during gait training but continues to require frequent verbal cueing to increase BOS and stride length.  Pt requires continued assistance to maintain path of RW on the right during gait  Pt continues to demonstrate poor safety awareness and decreased ability to follow verbal commands consistently.  Pt's PEG tube was leaking at end of session with nursing notified.     Follow Up Recommendations  CIR;Supervision/Assistance - 24 hour     Equipment Recommendations  None recommended by PT    Recommendations for Other Services       Precautions / Restrictions Precautions Precautions: Fall Precaution Comments: trach collar, PEG, no bone flap L skull Required Braces or Orthoses: Other Brace/Splint Other Brace/Splint: Bilateral PRAFO Boots In bed only please apply ace wrap on top to secure as they are size too big to fit appropriately,  HELMET TO BE WORN OOB AT ALL TIMES, CAN BE OFF IN BED.   Restrictions Weight Bearing Restrictions: No    Mobility  Bed Mobility Overal bed mobility: Needs Assistance Bed Mobility: Supine to  Sit     Supine to sit: Min assist     General bed mobility comments: increased time for management of R side, min assist for R UE, min assist for R LE but continues to initate ROM of R LE towards EOB   Transfers Overall transfer level: Needs assistance Equipment used: Rolling walker (2 wheeled) Transfers: Sit to/from Stand Sit to Stand: Min assist;+2 safety/equipment(pt is unable to place R hand on RW.  she requires hand over hand assistance to place hand on R walker grip.  once hand is strapped to R walker grip she is able to achieve standing with minimal assist)         General transfer comment: pt requires min A to boost into standing after therapist assists patient with R hand placement.  Ambulation/Gait Ambulation/Gait assistance: Mod assist;+2 safety/equipment Gait Distance (Feet): 35 Feet Assistive device: Rolling walker (2 wheeled) Gait Pattern/deviations: Step-through pattern;Decreased stride length;Scissoring;Decreased step length - right;Narrow base of support Gait velocity: decreased   General Gait Details: Pt continues to present with hemiparetic gait but able to progress steps on R side without assistance.  Pt demonstrated good initiation of gait on the R. Pt required cues to increase stride on R , increase BOS, and assistance to maintain alignment of RW.  Pt became fatigued requiring rest break before being returned to room.   Stairs             Wheelchair Mobility    Modified Rankin (Stroke Patients Only) Modified Rankin (Stroke Patients Only) Pre-Morbid Rankin Score: No symptoms Modified Rankin: Moderately severe disability     Balance Overall balance assessment:  Needs assistance Sitting-balance support: Feet supported;Single extremity supported Sitting balance-Leahy Scale: Fair Sitting balance - Comments: min guard for safety in static sitting Postural control: Right lateral lean Standing balance support: During functional activity;Single  extremity supported Standing balance-Leahy Scale: Poor Standing balance comment: reliant on UE support from splint/external assist from therapist, Remains to lose balance to the R.                              Cognition Arousal/Alertness: Awake/alert Behavior During Therapy: Flat affect;Impulsive Overall Cognitive Status: Within Functional Limits for tasks assessed Area of Impairment: Following commands;Safety/judgement;Problem solving                       Following Commands: Follows one step commands with increased time;Follows one step commands inconsistently Safety/Judgement: Decreased awareness of safety   Problem Solving: Slow processing;Decreased initiation;Requires verbal cues;Requires tactile cues;Difficulty sequencing General Comments: pt able to follow commands when given increased time.  Demonstrated awareness of R UE and R LE positioned when VC provided      Exercises General Exercises - Lower Extremity Long Arc Quad: AAROM;Right;10 reps;Seated Long Arc Quad Limitations: able to initiate movement but unable to complete ROM.  Patient demonstrated minimal ability to hold LAQ eccentrically.  Therapist provided repeated verbal cues    General Comments        Pertinent Vitals/Pain Pain Assessment: Faces Faces Pain Scale: Hurts a little bit(signaled to therapist pain in chest) Pain Location: chest pain Pain Descriptors / Indicators: Grimacing;Discomfort Pain Intervention(s): Monitored during session    Home Living                      Prior Function            PT Goals (current goals can now be found in the care plan section) Acute Rehab PT Goals Patient Stated Goal: unable to participate with goals PT Goal Formulation: Patient unable to participate in goal setting Time For Goal Achievement: 01/13/18 Potential to Achieve Goals: Fair Additional Goals Additional Goal #1: Pt will show purposeful movement to v/t cuing. Progress towards  PT goals: Progressing toward goals    Frequency    Min 3X/week      PT Plan Discharge plan needs to be updated    Co-evaluation              AM-PAC PT "6 Clicks" Daily Activity  Outcome Measure  Difficulty turning over in bed (including adjusting bedclothes, sheets and blankets)?: Unable Difficulty moving from lying on back to sitting on the side of the bed? : A Little Difficulty sitting down on and standing up from a chair with arms (e.g., wheelchair, bedside commode, etc,.)?: A Lot Help needed moving to and from a bed to chair (including a wheelchair)?: A Little Help needed walking in hospital room?: A Lot Help needed climbing 3-5 steps with a railing? : A Lot 6 Click Score: 13    End of Session Equipment Utilized During Treatment: Gait belt Activity Tolerance: Patient tolerated treatment well;Patient limited by fatigue Patient left: in chair;with chair alarm set;with call bell/phone within reach Nurse Communication: Mobility status PT Visit Diagnosis: Other abnormalities of gait and mobility (R26.89);Muscle weakness (generalized) (M62.81);Hemiplegia and hemiparesis Hemiplegia - Right/Left: Right Hemiplegia - caused by: Nontraumatic intracerebral hemorrhage     Time: 1445-1535 PT Time Calculation (min) (ACUTE ONLY): 50 min  Charges:  $Gait Training: 23-37 mins $Therapeutic Activity:  8-22 mins                     [address], SPTA   Arlina Robes 01/06/2018, 4:44 PM

## 2018-01-06 NOTE — Progress Notes (Signed)
RT NOTES: Patient capped per orders. Patient on room air. Sats 100%.

## 2018-01-06 NOTE — Progress Notes (Signed)
NAME:  Tammie Miles, MRN:  [id number], DOB:  1975/03/24, LOS: 40 ADMISSION DATE:  11/27/2017, CONSULTATION DATE:  11/27/2017 REFERRING MD:  Dr. Conchita Paris CHIEF COMPLAINT:  Left frontal hemorrhage  Brief History   70 yoF admitted 9/19 with AMS that began the evening of 9/18, initially thought due to intoxication. Remained unresponsive through am of 9/19.  Patient was brought to the ED where a CT was done and showed a large left sided ICH. Patient was taken to the OR and a mini craniectomy was done.  Patient was brought out to the ICU and PCCM was consulted for vent management. Course complicated by cerebral edema requiring 3% saline limited by hypernatremia. Also complicated by encephalopathy. Tolerating vent wean, but mental status limited extubation. Then 9/25 she developed what appeared to be seizures with gaze deviation and tongue biting. EEG done overnight with results pending and AEDs adjusted. No further seizure seen.   Significant Hospital Events   09/19 Admit/ OR- mini crani 09/20 Decompressive craniectomy ( L)  with skull harvesting in the abdomen 09/21 hypertonic saline started 09/23 hypertonic stopped.  09/25 Seizure. EEG done with no sz activity. AEDs adjusted. 10/01 Trach / PEG 10/02 Trach collar  10/28  Room air, #4 cuffless  10/29  Ambulated in hallway.  Trach capped.    Consults: date of consult/date signed off & final recs  9/19 NSGY 9/19 PCCM   Procedures (surgical and bedside):  9/19 Intubated 9/19 OR for crani, evac of hematoma>> no change in neuro status post op 9/20 Decompressive craniectomy ( L)  with skull harvesting in the abdomen  Significant Diagnostic Tests:  9/19 [facility]  Large area of hemorrhage in the left basal ganglia and left frontal lobe, 5.8 x 3.5 cm. 15 mm of left-to-right midline shift. 9/19 CTA head: Positive for left MCA M1 large vessel occlusion, and also severe stenosis at the left ICA terminus, superimposed on the relatively large acute left  hemisphere intra-axial hemorrhage (estimated blood volume 43 mL). Vasospasm suspected in the proximal ACAs. 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 2. Negative for intracranial aneurysm, CTA spot sign, or evidence of vascular malformation in association with the acute hemorrhage. 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 3. Stable intracranial mass effect since 0938 hours today, with 16 mm of leftward midline shift and trapping of the right lateral ventricle. 11/29/2017 CT Head Large left MCA infarct is stable in distribution. Interval left frontal craniectomy with mild herniation of left frontal lobe through the defect. Resolved uncal herniation, improved patency of left lateral ventricle, and 6 mm left-to-right midline shift, previously 8 mm. Partial interval dispersion of acute hemorrhage in the left basal ganglia. Subcentimeter density anterior to the prior position of blood products possibly representing retracted clot or interval hemorrhage, attention at follow-up recommended. CT 9/24: 1. Left MCA distribution infarction is stable in distribution. Mild increased edema of the infarct with increased herniation of the left frontal lobe via the left frontal craniectomy. 2. Stable 6 mm left-to-right midline shift and partial effacement of left lateral ventricle. 3. Stable small hematoma within the left basal ganglia. 4. No new acute intracranial abnormality identified.  Micro Data: 9/19 MRSA PCR >> Negative 9/19 Sputum Culture >> normal flora 9/22 Blood x 2 >> negative 9/22 UA >> Normal 9/22 Sputum >> few candida dubliniensis 12/13/2017 blood cultures x2>> negative 12/13/2017 urine  culture>> negative 12/13/2017 sputum culture>> Citrobacter  Antimicrobials:  9/19 Cefazolin (pre-op) 9/20  Subjective:  On room  air.  No acute events overnight.  Patient up in hall, walked with PT!   Objective   Blood pressure (!) 135/92, pulse 100, temperature 98 F (36.7 C), temperature source Oral, resp. rate 20, height 5\' 1"  (1.549 m), weight 67.3 kg, SpO2 97 %.    FiO2 (%):  [21 %] 21 %   Intake/Output Summary (Last 24 hours) at 01/06/2018 1507 Last data filed at 01/06/2018 1033 Gross per 24 hour  Intake 240 ml  Output -  Net 240 ml   Filed Weights   01/01/18 0500 01/04/18 0358 01/05/18 0341  Weight: 65.3 kg 66.5 kg 67.3 kg    Intake/Output Summary (Last 24 hours) at 01/06/2018 1507 Last data filed at 01/06/2018 1033 Gross per 24 hour  Intake 240 ml  Output -  Net 240 ml     Examination:  General: adult female lying in bed in NAD HEENT: MM pink/moist, #4 trach midline c/d/i, trach capped while in room, tolerating well, strong cough, able to clear secretions Neuro: Awake, alert, attempts to speak / makes noises but no discernable words, appropriately covers mouth during cough, walked in hall with PT CV: s1s2 rrr, no m/r/g PULM: even/non-labored, lungs bilaterally clear  ZO:XWRU, non-tender, bsx4 active  Extremities: warm/dry, no edema  Skin: no rashes or lesions, multiple tattoos   Resolved Hospital Problem list     Assessment & Plan:  42 year old female s/p crani for MCA CVA now with a trach  Acute respiratory insufficiency in the setting of massive IPH - s/p trach placement 10/1. P: Continue #4 cuffless trach  Begin capping trial 10/29.  Plan to leave capped & have conversation with son (when we can get in touch with him) regarding plan moving forward for decannulation.  Room air as tolerated  Remove red cap if any evidence of distress  Hypoxemia - resolved P: Monitor saturations Tolerated activity on room air  Tracheostomy Status P: Trach care per protocol  Trial of capping trach.  Will need to discuss plan moving forward with son regarding removal of trach / would we put it  back in / risk of aspiration events etc  Dysphagia: P: SLP following   Disposition / Summary of Today's Plan 01/06/18   Continue PT efforts (she is making excellent progress).  Cap trach 10/29 and monitor. CIR reviewing for possible placement.     Diet: per primary DVT prophylaxis: SCD's GI prophylaxis: pepcid  Mobility: activity as tolerated with assistance Code Status: full Family Communication: No family available 10/29.  Called son with no answer.  Will keep trying to contact him. He has been challenging to get in touch with in the recent past due to phone issues.   Labs   CBC: Recent Labs  Lab 01/01/18 0425 01/04/18 1434  WBC 7.8 7.3  HGB 10.5* 10.6*  HCT 34.4* 35.5*  MCV 83.9 84.3  PLT 246 285   Basic Metabolic Panel: Recent Labs  Lab 01/01/18 0425 01/02/18 0410 01/04/18 1434  NA 140 139 139  K 3.5 3.7 4.1  CL 107 105 105  CO2 22 23 24   GLUCOSE 143* 98 108*  BUN 12 10 14   CREATININE 1.48* 0.71 0.82  CALCIUM 9.7 9.6 9.8   GFR: Estimated Creatinine Clearance: 78.4 mL/min (by C-G formula based on SCr of 0.82 mg/dL). Recent Labs  Lab 01/01/18 0425 01/04/18 1434  WBC 7.8 7.3  Liver Function Tests: No results for input(s): AST, ALT, ALKPHOS, BILITOT, PROT, ALBUMIN in the last 168 hours. No results for input(s): LIPASE, AMYLASE in the last 168 hours. No results for input(s): AMMONIA in the last 168 hours. ABG    Component Value Date/Time   PHART 7.444 12/08/2017 0414   PCO2ART 36.7 12/08/2017 0414   PO2ART 109 (H) 12/08/2017 0414   HCO3 24.6 12/08/2017 0414   TCO2 27 12/05/2017 1216   ACIDBASEDEF 6.0 (H) 11/29/2017 0310   O2SAT 98.2 12/08/2017 0414    Coagulation Profile: No results for input(s): INR, PROTIME in the last 168 hours.   Cardiac Enzymes: No results for input(s): CKTOTAL, CKMB, CKMBINDEX, TROPONINI in the last 168 hours.   HbA1C: Hgb A1c MFr Bld  Date/Time Value Ref Range Status  11/28/2017 08:32 AM 5.8 (H) 4.8 - 5.6 % Final     Comment:    (NOTE) Pre diabetes:          5.7%-6.4% Diabetes:              >6.4% Glycemic control for   <7.0% adults with diabetes    CBG: Recent Labs  Lab 12/30/17 1618 12/31/17 1216 12/31/17 1635  GLUCAP 126* 159* 89   Canary Brim, NP-C [facility] Pgr: [phone] or if no answer [phone] 01/06/2018, 3:07 PM

## 2018-01-06 NOTE — Progress Notes (Signed)
  Speech Language Pathology Treatment: Dysphagia;Cognitive-Linquistic  Patient Details Name: Tammie Miles MRN: [id number] DOB: 11-Nov-1975 Today's Date: 01/06/2018 Time: 2440-1027 SLP Time Calculation (min) (ACUTE ONLY): 17 min  Assessment / Plan / Recommendation Clinical Impression  Pt was seen for skilled ST targeting goals for functional communication and dysphagia.  Pt wore PMSV for the duration of today's session with no changes in vitals or s/s of discomfort.  Therapist facilitated the session with trials of dys 3 textures to continue working towards diet progression.  Pt demonstrated slowed but effective mastication of advanced solids.  She had one instance of delayed coughing on mixed solid and liquid consistencies but had no other s/s of aspiration with solids or liquids.  More concerning is that trials of solids resulted in pt's gums beginning to bleed.  Therefore, trials were limited for pt comfort and oral hygiene.  Pt is following 1 step commands in a functional context with min assist.  She was also noted to answer basic yes/no questions via head shaking to indicate when she was finished with PO trials in ~25% of opportunities.  Pt presents today with increased groping behaviors in an attempt to verbalize during structured speech and non speech oral motor tasks.  No real word utterances were appreciated during tasks; however, pt's vocalizations appeared much more purposeful today in comparison to previous reports.  Pt was left in bed with bed alarm set.  Continue per current plan of care.    HPI HPI: 47 yoF with PMH significant for HTN, anxiety, ADHD, admitted 9/19 with large L hemorrhagic basal ganglia with 15 mm shift with early herniation s/p L frontotemporal crani 9/19 for evacuation of hematoma. ETT 9/19; 9/20 returned to OR for decompressive craniectomy ; 10/1 trach/PEG; transitioned to TC and off vent 10/5. CT 9/24: 1. Left MCA distribution infarction is stable in distribution. Mild  increased edema of the infarct with increased herniation of the left frontal lobe via the left frontal craniectomy. 2. Stable 6 mm left-to-right midline shift and partial effacement of left lateral ventricle. 3. Stable small hematoma within the left basal ganglia. CXR 10/9: no acute disease process. Pt trach changed yesterday to Shiley 6 cuffless      SLP Plan  Continue with current plan of care       Recommendations  Diet recommendations: Dysphagia 1 (puree);Thin liquid Liquids provided via: Cup;Straw Medication Administration: Crushed with puree Supervision: Staff to assist with self feeding;Full supervision/cueing for compensatory strategies Compensations: Slow rate;Small sips/bites;Minimize environmental distractions;Follow solids with liquid Postural Changes and/or Swallow Maneuvers: Seated upright 90 degrees      Patient may use Passy-Muir Speech Valve: Intermittently with supervision;During PO intake/meals PMSV Supervision: Full         Oral Care Recommendations: Oral care BID Follow up Recommendations: Inpatient Rehab SLP Visit Diagnosis: Cognitive communication deficit (R41.841);Dysphagia, oropharyngeal phase (R13.12) Plan: Continue with current plan of care       GO                PageMelanee Spry 01/06/2018, 12:04 PM

## 2018-01-06 NOTE — Progress Notes (Addendum)
Inpatient Rehabilitation Admissions Coordinator  Case discussed at [facility] today. I will discuss case with Kandis Mannan and Dr. Ivory Broad for rehab venue options. I will follow up with Acute team following further discussions. RN CM and SW are aware.  Ottie Glazier, RN, MSN Rehab Admissions Coordinator [phone] 01/06/2018 10:26 AM   Dr. Riley Kill in agreement to admit to CIR to decrease burden of care before transitioning to long term rehab venue. I have left a message for both pt's 58 year old son, [name], and his Father, Thereasa Distance to call back to discuss CIR admission goals and expectations. I have alerted RN, Stroke service, RN CM  And SW. I will follow up today for hopeful admit once discussed with her NOK.  Ottie Glazier, RN, MSN Rehab Admissions Coordinator [phone] 01/06/2018 12:07 PM'  I received call pt's son's father, Thereasa Distance. I have asked for his assistance in contacting pt's son, [name]/NOK. He states their son has no more minutes on his phone and that he will try to get in touch with him by Face book Messenger. Son should have phone minutes by Friday. I await discussion with pt's son, [name], before proceeding with admit to CIR. SW updated.  Ottie Glazier, RN, MSN Rehab Admissions Coordinator [phone] 01/06/2018 2:09 PM

## 2018-01-06 NOTE — Progress Notes (Addendum)
STROKE TEAM - DAILY PROGRESS NOTE   SUBJECTIVE SLP working with pt, who continues to me more interactive. Rehab looking at for transfer given lack of SNF bed availability. Dr. Leonie Man also discussed with Dr. Valeta Harms to look at decannulation d/t pt ongoing progress, at it decreases placement options.   OBJECTIVE Most recent Vital Signs: Vitals:   01/06/18 0307 01/06/18 0335 01/06/18 0756 01/06/18 0936  BP:  135/75 (!) 146/101   Pulse: 94 75 90 90  Resp: _0 Temp:  98 F (36.7 C) 98.2 F (36.8 C)   TempSrc:  Axillary Axillary   SpO2: 100% 100% 98% 97%  Weight:      Height:       CBG (last 3)  No results for input(s): GLUCAP in the last 72 hours.   CBC Latest Ref Rng & Units 01/04/2018 01/01/2018 12/28/2017  WBC 4.0 - 10.5 K/uL 7.3 7.8 7.9  Hemoglobin 12.0 - 15.0 g/dL 10.6(L) 10.5(L) 9.8(L)  Hematocrit 36.0 - 46.0 % 35.5(L) 34.4(L) 33.3(L)  Platelets 150 - 400 K/uL 285 246 273    BMP Latest Ref Rng & Units 01/04/2018 01/02/2018 01/01/2018  Glucose 70 - 99 mg/dL 108(H) 98 143(H)  BUN 6 - 20 mg/dL _1 Creatinine 0.44 - 1.00 mg/dL 0.82 0.71 1.48(H)  Sodium 135 - 145 mmol/L 139 139 140  Potassium 3.5 - 5.1 mmol/L 4.1 3.7 3.5  Chloride 98 - 111 mmol/L 105 105 107  CO2 22 - 32 mmol/L _2 Calcium 8.9 - 10.3 mg/dL 9.8 9.6 9.7     Physical Exam   -  per Dr. Leonie Man HEENT-status post left frontoparietal craniotomy with wound clean dry Cardiovascular- S1-S2, RRR Skin-warm and dry Neurological Exam:  Awake, alert, globally aphasic. Tracks examiner and tries to answer questions. Will raise L arm to command, hold/release examiner's hand to command and follow other simple commands. Does not follow commands on the right, no spontaneous movement on the right.  Attempts to smile and speak. Eyes open, EOMI, PERRL, mild left ptosis. Not blinking to visual threat on the right, but able to track examiner on the right. Right facial  droop. Tongue protrusion midline. LUE 5/5 and able to hold against gravity. LLE 5/5. RUE and RLE withdraws to pain but cannot hold antigravity. Dec R sided tone. coordination and gait not otherwise tested.   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 15 mm of left-to-right midline shift.  MRI of the brain: Post craniotomy 8-9 mm of right-to-left midline shift with mild left uncal herniation similar to previous. Underlying evolving large acute ischemic left MCA territory infarct involving the majority of the left MCA territory.  Repeat Head CT post evacuation 9/20 - Left MCA territory infarct with evacuated hematoma. Midline shift measures 8 mm.   UDS - positive for amphetamines ( Pt takes Adderal at home)  2D Echocardiogram EF 60 to 65%  LE venous doppler negative for DVT  Hypercoagulable labs negative except high ESR and CRP  LDL 64  HgbA1c 5.8  VTE: Heparin subq   On ASA  81 for stroke prevention  Therapy recommendations: SNF. Possible CIR d/t lack of SNF availability. CIR admissions coordinator awaiting discussion w/ son.  Disposition: Pending   Fall risk  Has low bed  Soft Helmet for use when at risk for fall  Acute respiratory insufficiency in the setting of massive IPH s/p trach  Status post tracheostomy on 12/09/17  Doing well on trach collar  Repetitively pulls/attempts to pull out trach, mitt placed on and restraint  Has #4 shiley/cuffless.   PMV underway w/ SLP  Per CCM, not a candidate for decannulation at the point but may be in the future. Dr. Leonie Man  discussed with Dr. Valeta Harms today and CCM to consider timing.  Seizure  Episode of stiffness, tongue biting on 12/03/17  Overnight EEG no seizure  Increased keppra from 500 to 1015m bid  No more seizure since  Seizure precautions  Hypertensive emergency  BP elevated on admission  On norvasc 10,  Labetalol 300 tid  Off minoxidil and HCTZ, lisinopril 20 bid (Stopped lisinopril 10/24 d/t increased creatinine)  BP remains 130s  BP goal normotensive  Anemia  Hb 11.8 on admission  Hemoglobin 7.3-->8.0->8.3->8.5->8.2 -> 9.3-> 9.8->10.5->10.6 improving  Received one unit PRBC during this admission    Continue to monitor  Iron study iron 36 and ferritin 66  Continue  iron pills   Dysphagia, secondary to stroke  PEG tube placed on 12/09/2017 for tube feeding  Now on D1 thin liquid diet and tolerating  TF only in pt east < 50% meals  RD following   Other Stroke Risk Factors  ETOH use   Other Active Problems  ADHD - on Adderell at home  Anxiety, Depression  Hypokalemia, resolved   Creatinine 1.48 x 1 day only, then resolved. Unclear etiology. ? Lab error  Hospital day # 4[place] MSN, APRN, ANVP-BC, AGPCNP-BC Advanced Practice Stroke Nurse CRuchfor Schedule & Pager information 01/06/2018 2:50 PM  D/w Dr IValeta Harmsand critical care nurse practitioner plan to cap tracheostomy tube and see if it can removed  over the next few days. Also discussed with rehabilitation coordinator for possible transfer to inpatient rehabilitation. No family available at the bedside for discussion.greater than 50% time during this 25 minute visit was spent on counseling and coordination of care about her stroke and tracheostomy in discussion and care team PAntony Contras MSt. Mary'sPager: [phone]/29/2019 3:09 PM  To contact Stroke Continuity provider, please refer to [email or web address] After hours, contact  General Neurology

## 2018-01-07 MED ORDER — CHLORHEXIDINE GLUCONATE 0.12 % MT SOLN
OROMUCOSAL | Status: AC
  Administered 2018-01-07: 15 mL via OROMUCOSAL
  Filled 2018-01-07: qty 15

## 2018-01-07 NOTE — Progress Notes (Signed)
PCCM Interval Progress Note  Patient tolerating capping trial thus far with no signs of distress and stable O2 and hemodynamics.  Minimal secretions per RT.  PCCM will continue to follow and evaluate for possible de-cannulation on 11/1.   Posey Boyer, AGACNP-BC [facility] Pgr: [phone] or if no answer [phone] 01/07/2018, 12:16 PM

## 2018-01-07 NOTE — Progress Notes (Addendum)
Inpatient Rehabilitation Admissions Coordinator  I received a return call from pt's son, Guy Begin, after messaging him per his Dad's request. He is in agreement to plan to admit to inpt rehab to decrease burden of care prior to d/c to SNF. He wants to remain her NOK, does not want to relinquish her to guardianship . I have contacted RN CM, SW and Annie Main, [facility] to make the arrangments  to admit tomorrow.  Ottie Glazier, RN, MSN Rehab Admissions Coordinator [phone] 01/07/2018 4:56 PM

## 2018-01-07 NOTE — Progress Notes (Addendum)
Inpatient Rehabilitation Admissions Coordinator  I contacted pt's son's Father, Thereasa Distance, by phone today to clarify if he was able to make contact with his son, Guy Begin, via face book messenger. He states he has not received  A response. He will again try with he and his wife to contact pt's son, Guy Begin. I expressed the need for a NOK participation and involvement in her care hence forward to be able to assist [facility] in making continued gains. I have contacted York Ram, of Care Management with issues with family support and the possible need for guardianship in the future. I expressed to Ronta's Dad that family involvement needed or that guardianship may have to be sought .  Ottie Glazier, RN, MSN Rehab Admissions Coordinator [phone] 01/07/2018 3:07 PM

## 2018-01-07 NOTE — Progress Notes (Addendum)
STROKE TEAM - DAILY PROGRESS NOTE   SUBJECTIVE Patient in bed. Has removed wet diaper and has a pile of stool on the floor. Pt moving in bed. Trach plugged. Appears to tolerate well.  OBJECTIVE Most recent Vital Signs: Vitals:   01/07/18 0055 01/07/18 0334 01/07/18 0553 01/07/18 0756  BP:  111/84 (!) 135/93 122/80  Pulse: 94 94 96 88  Resp: _0 Temp:  98.9 F (37.2 C)  97.7 F (36.5 C)  TempSrc:  Oral  Axillary  SpO2: 94% 96%  97%  Weight:      Height:         CBC Latest Ref Rng & Units 01/04/2018 01/01/2018 12/28/2017  WBC 4.0 - 10.5 K/uL 7.3 7.8 7.9  Hemoglobin 12.0 - 15.0 g/dL 10.6(L) 10.5(L) 9.8(L)  Hematocrit 36.0 - 46.0 % 35.5(L) 34.4(L) 33.3(L)  Platelets 150 - 400 K/uL 285 246 273    BMP Latest Ref Rng & Units 01/04/2018 01/02/2018 01/01/2018  Glucose 70 - 99 mg/dL 108(H) 98 143(H)  BUN 6 - 20 mg/dL _1 Creatinine 0.44 - 1.00 mg/dL 0.82 0.71 1.48(H)  Sodium 135 - 145 mmol/L 139 139 140  Potassium 3.5 - 5.1 mmol/L 4.1 3.7 3.5  Chloride 98 - 111 mmol/L 105 105 107  CO2 22 - 32 mmol/L _2 Calcium 8.9 - 10.3 mg/dL 9.8 9.6 9.7     Physical Exam   -  per Dr. Leonie Man HEENT-status post left frontoparietal craniotomy with wound clean dry Cardiovascular- S1-S2, RRR Skin-warm and dry Trach plugged, no respiratory distress Neurological Exam:  Awake, alert, globally aphasic. Tracks examiner and tries to answer questions. Will raise L arm to command, hold/release examiner's hand to command and follow other simple commands. Does not follow commands on the right, no spontaneous movement on the right.  Attempts to smile and speak. Eyes open, EOMI, PERRL, mild left ptosis. Not blinking to visual threat on the right, but able to track examiner. Right facial droop. Tongue protrusion midline. LUE 5/5 and able to hold against gravity. LLE 5/5. RUE and RLE withdraws to pain but cannot hold antigravity. coordination and  gait not otherwise tested.   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 15 mm of left-to-right midline shift.  MRI of the brain: Post craniotomy 8-9 mm of right-to-left midline shift with mild left uncal herniation similar to previous. Underlying evolving large acute ischemic left MCA territory infarct involving the majority of the left MCA territory.  Repeat Head CT post evacuation 9/20 - Left MCA territory infarct with evacuated hematoma. Midline shift measures 8 mm.   UDS - positive for amphetamines ( Pt takes Adderal at home)  2D Echocardiogram EF 60 to 65%  LE venous doppler negative for DVT  Hypercoagulable labs negative except high ESR and CRP  LDL 64  HgbA1c 5.8  VTE: Heparin subq   On ASA 81 for stroke prevention  Therapy recommendations: SNF. Possible CIR d/t lack of SNF availability. CIR admissions coordinator awaiting discussion w/ son.  Disposition: Pending   Fall risk  Has low  bed  Pads beside bed  Soft Helmet for use when at risk for fall  Acute respiratory insufficiency in the setting of massive IPH s/p trach  Status post tracheostomy on 12/09/17  Has #4 shiley/cuffless,  which is capped.   PMV underway w/ SLP  Capping trach trials underway, if stable x 3 days, plan to decannulate  Seizure  Episode of stiffness, tongue biting on 12/03/17  Overnight EEG no seizure  Increased keppra from 500 to 1034m bid  No more seizure since  Seizure precautions  Hypertensive emergency  BP elevated on admission  On norvasc 10,  Labetalol 300 tid  Off  minoxidil and HCTZ, lisinopril 20 bid (Stopped lisinopril 10/24 d/t increased creatinine)  BP remains 130s  BP goal normotensive  Anemia  Hb 11.8 on admission  Hemoglobin 7.3-->8.0->8.3->8.5->8.2 -> 9.3-> 9.8->10.5->10.6 improving  Received one unit PRBC during this admission    Continue to monitor  Iron study iron 36 and ferritin 66  Continue  iron pills   Dysphagia, secondary to stroke  PEG tube placed on 12/09/2017 for tube feeding  Now on D1 thin liquid diet and tolerating  TF only in pt east < 50% meals  RD following   Other Stroke Risk Factors  ETOH use   Other Active Problems  ADHD - on Adderell at home  Anxiety, Depression  Hypokalemia, resolved   Creatinine 1.48 x 1 day only, then resolved. Unclear etiology. ? Lab error  Hospital day # [place] MSN, APRN, ANVP-BC, AGPCNP-BC Advanced Practice Stroke Nurse [place] Schedule & Pager information 01/07/2018 8:11 AM  I have personally examined this patient, reviewed notes, independently viewed imaging studies, participated in medical decision making and plan of care.ROS completed by me personally and pertinent positives fully documented  I have made any additions or clarifications directly to the above note. Agree with note above.    PAntony Contras MD Medical Director [facility] Pager: [phone]/30/2019 4:37 PM   To contact Stroke Continuity provider, please refer to [email or web address] After hours, contact General Neurology

## 2018-01-07 NOTE — Progress Notes (Addendum)
Physical Therapy Treatment Patient Details Name: Tammie Miles MRN: [id number] DOB: [birth year] Today's Date: 01/07/2018    History of Present Illness Patient is a 42 y/o female who was admitted for AMS/unresponsiveness. 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 Returned to OR 9/20 for decompressive crani with left skull harvested in abdomen. 9/25 developed seizures. Intubated 9/19-10/7. s/p trach and peg 10/1. PMH includes HTN, depression, anxiety.  Trach capped on 10/29.    PT Comments    Patient's tolerance to treatment today was good.  Pt demonstrated ability to perform purposeful movements throughout session particularly during gait training with repeated verbal cueing from therapist.  Pt continues to be functionally limited due to R side hemiparesis, decreased strength and decreased balance.  Patient would continue to benefit from acute care PT in order to improve strength, balance, and management of RW during gait prior to recommended CIR destination.  Follow Up Recommendations  CIR;Supervision/Assistance - 24 hour     Equipment Recommendations  None recommended by PT    Recommendations for Other Services       Precautions / Restrictions Precautions Precautions: Fall Precaution Comments: trach collar, PEG, no bone flap L skull Required Braces or Orthoses: Other Brace/Splint Other Brace/Splint: Bilateral PRAFO Boots In bed only please apply ace wrap on top to secure as they are size too big to fit appropriately,  HELMET TO BE WORN OOB AT ALL TIMES, CAN BE OFF IN BED.   Restrictions Weight Bearing Restrictions: No    Mobility  Bed Mobility Overal bed mobility: Needs Assistance Bed Mobility: Supine to Sit Rolling: Mod assist   Supine to sit: Min assist;Mod assist(Mod assist to scoot)     General bed mobility comments:  continues to require increased time for management of right side.  continues to initiate ROM of R LE towards EOB. Pt was able to lean to the right to scoot L hip forward. Pt required min assist and VC to scoot right hip forward.   Transfers Overall transfer level: Needs assistance Equipment used: Rolling walker (2 wheeled) Transfers: Sit to/from Stand Sit to Stand: Min assist;+2 safety/equipment(pt initiated placing hand in R walker grip with L hand)         General transfer comment: pt continues to require min assist to boost into standing after R hand strapped into walker grip.  Ambulation/Gait Ambulation/Gait assistance: Mod assist;+2 safety/equipment Gait Distance (Feet): 35 Feet Assistive device: Rolling walker (2 wheeled) Gait Pattern/deviations: Step-through pattern;Decreased stride length;Scissoring;Decreased step length - right;Narrow base of support Gait velocity: decreased   General Gait Details: Pt continues to present with hemiparetic gait but able to progress steps on R side without assistance.  Pt continues to demonstrate good initiation of gait on the R.  Pt initially demonstrated scissoring gait that resolved during the treatment. pt continues to need VC to increase BOS and assistance to maintain alignment of RW.    Stairs             Wheelchair Mobility    Modified Rankin (Stroke Patients Only) Modified Rankin (Stroke Patients Only) Pre-Morbid Rankin Score: No symptoms Modified Rankin: Moderately severe disability     Balance Overall balance assessment: Needs assistance Sitting-balance support: Feet supported;Single extremity supported Sitting balance-Leahy Scale: Fair Sitting balance - Comments: min guard for safety in static sitting Postural control: Right lateral lean Standing balance support: During functional  activity;Single extremity supported Standing balance-Leahy Scale: Poor Standing balance comment: reliant on UE support from splint/external  assist from therapist, Remains to lose balance to the R.                              Cognition Arousal/Alertness: Awake/alert Behavior During Therapy: Flat affect Overall Cognitive Status: Within Functional Limits for tasks assessed Area of Impairment: Following commands;Safety/judgement;Problem solving                       Following Commands: Follows one step commands with increased time;Follows one step commands inconsistently Safety/Judgement: Decreased awareness of safety   Problem Solving: Slow processing;Decreased initiation;Requires verbal cues;Requires tactile cues;Difficulty sequencing General Comments: pt continues to require increased time to follow commands.  Demonstrated awareness of R UE and R LE when VC provided      Exercises General Exercises - Lower Extremity Long Arc Quad: AAROM;Right;10 reps;Seated;AROM;Left Long Arc Quad Limitations: able to initiate movement but unable to complete ROM.  Patient demonstrated minimal ability to hold LAQ eccentrically.  Therapist provided repeated verbal cues    General Comments        Pertinent Vitals/Pain Pain Assessment: Faces Faces Pain Scale: No hurt    Home Living                      Prior Function            PT Goals (current goals can now be found in the care plan section) Acute Rehab PT Goals Patient Stated Goal: unable to participate with goals PT Goal Formulation: Patient unable to participate in goal setting Time For Goal Achievement: 01/13/18 Potential to Achieve Goals: Fair Additional Goals Additional Goal #1: Pt will show purposeful movement to v/t cuing. Progress towards PT goals: Progressing toward goals    Frequency    Min 3X/week      PT Plan Discharge plan needs to be updated    Co-evaluation              AM-PAC PT "6 Clicks" Daily Activity  Outcome Measure  Difficulty turning over in bed (including adjusting bedclothes, sheets and blankets)?:  Unable Difficulty moving from lying on back to sitting on the side of the bed? : A Little Difficulty sitting down on and standing up from a chair with arms (e.g., wheelchair, bedside commode, etc,.)?: A Little Help needed moving to and from a bed to chair (including a wheelchair)?: A Little Help needed walking in hospital room?: A Lot Help needed climbing 3-5 steps with a railing? : A Lot 6 Click Score: 14    End of Session Equipment Utilized During Treatment: Gait belt Activity Tolerance: Patient tolerated treatment well Patient left: in chair;with chair alarm set;with call bell/phone within reach Nurse Communication: Mobility status PT Visit Diagnosis: Other abnormalities of gait and mobility (R26.89);Muscle weakness (generalized) (M62.81);Hemiplegia and hemiparesis Hemiplegia - Right/Left: Right Hemiplegia - caused by: Nontraumatic intracerebral hemorrhage     Time: 1119-1150 PT Time Calculation (min) (ACUTE ONLY): 31 min  Charges:  $Gait Training: 8-22 mins $Therapeutic Activity: 8-22 mins                     [address], SPTA   Atlee Villers 01/07/2018, 2:11 PM

## 2018-01-08 ENCOUNTER — Encounter (HOSPITAL_COMMUNITY): Payer: Self-pay | Admitting: *Deleted

## 2018-01-08 ENCOUNTER — Encounter (HOSPITAL_COMMUNITY): Payer: Self-pay

## 2018-01-08 ENCOUNTER — Inpatient Hospital Stay (HOSPITAL_COMMUNITY)
Admission: RE | Admit: 2018-01-08 | Discharge: 2018-01-20 | DRG: 057 | Disposition: A | Discharge status: Skilled Nursing Facility | Payer: Medicaid Other | Source: Intra-hospital | Attending: Physical Medicine & Rehabilitation | Admitting: Physical Medicine & Rehabilitation

## 2018-01-08 DIAGNOSIS — I69119 Unspecified symptoms and signs involving cognitive functions following nontraumatic intracerebral hemorrhage: Secondary | ICD-10-CM

## 2018-01-08 DIAGNOSIS — I6522 Occlusion and stenosis of left carotid artery: Secondary | ICD-10-CM | POA: Diagnosis present

## 2018-01-08 DIAGNOSIS — Z79899 Other long term (current) drug therapy: Secondary | ICD-10-CM | POA: Diagnosis not present

## 2018-01-08 DIAGNOSIS — F329 Major depressive disorder, single episode, unspecified: Secondary | ICD-10-CM | POA: Diagnosis present

## 2018-01-08 DIAGNOSIS — Z930 Tracheostomy status: Secondary | ICD-10-CM | POA: Diagnosis not present

## 2018-01-08 DIAGNOSIS — I10 Essential (primary) hypertension: Secondary | ICD-10-CM | POA: Diagnosis present

## 2018-01-08 DIAGNOSIS — Z8249 Family history of ischemic heart disease and other diseases of the circulatory system: Secondary | ICD-10-CM

## 2018-01-08 DIAGNOSIS — F909 Attention-deficit hyperactivity disorder, unspecified type: Secondary | ICD-10-CM | POA: Diagnosis present

## 2018-01-08 DIAGNOSIS — R1310 Dysphagia, unspecified: Secondary | ICD-10-CM | POA: Diagnosis present

## 2018-01-08 DIAGNOSIS — I69120 Aphasia following nontraumatic intracerebral hemorrhage: Secondary | ICD-10-CM

## 2018-01-08 DIAGNOSIS — Z931 Gastrostomy status: Secondary | ICD-10-CM | POA: Diagnosis not present

## 2018-01-08 DIAGNOSIS — I69151 Hemiplegia and hemiparesis following nontraumatic intracerebral hemorrhage affecting right dominant side: Secondary | ICD-10-CM | POA: Diagnosis present

## 2018-01-08 DIAGNOSIS — D62 Acute posthemorrhagic anemia: Secondary | ICD-10-CM | POA: Diagnosis present

## 2018-01-08 DIAGNOSIS — F419 Anxiety disorder, unspecified: Secondary | ICD-10-CM | POA: Diagnosis present

## 2018-01-08 DIAGNOSIS — I612 Nontraumatic intracerebral hemorrhage in hemisphere, unspecified: Secondary | ICD-10-CM

## 2018-01-08 DIAGNOSIS — I69190 Apraxia following nontraumatic intracerebral hemorrhage: Secondary | ICD-10-CM | POA: Diagnosis not present

## 2018-01-08 DIAGNOSIS — I619 Nontraumatic intracerebral hemorrhage, unspecified: Secondary | ICD-10-CM | POA: Diagnosis present

## 2018-01-08 DIAGNOSIS — I69191 Dysphagia following nontraumatic intracerebral hemorrhage: Secondary | ICD-10-CM | POA: Diagnosis not present

## 2018-01-08 DIAGNOSIS — I69121 Dysphasia following nontraumatic intracerebral hemorrhage: Secondary | ICD-10-CM

## 2018-01-08 DIAGNOSIS — I610 Nontraumatic intracerebral hemorrhage in hemisphere, subcortical: Secondary | ICD-10-CM

## 2018-01-08 LAB — CBC
HCT: 35.7 % — ABNORMAL LOW (ref 36.0–46.0)
Hemoglobin: 11 g/dL — ABNORMAL LOW (ref 12.0–15.0)
MCH: 26.2 pg (ref 26.0–34.0)
MCHC: 30.8 g/dL (ref 30.0–36.0)
MCV: 85 fL (ref 80.0–100.0)
NRBC: 0 % (ref 0.0–0.2)
PLATELETS: 289 10*3/uL (ref 150–400)
RBC: 4.2 MIL/uL (ref 3.87–5.11)
RDW: 20.4 % — AB (ref 11.5–15.5)
WBC: 7.2 10*3/uL (ref 4.0–10.5)

## 2018-01-08 MED ORDER — FOLIC ACID 1 MG PO TABS
1.0000 mg | ORAL_TABLET | Freq: Every day | ORAL | Status: DC
  Administered 2018-01-09 – 2018-01-20 (×12): 1 mg
  Filled 2018-01-08 (×12): qty 1

## 2018-01-08 MED ORDER — SENNOSIDES-DOCUSATE SODIUM 8.6-50 MG PO TABS
1.0000 | ORAL_TABLET | Freq: Every evening | ORAL | Status: DC | PRN
  Administered 2018-01-15 – 2018-01-19 (×3): 1 via ORAL
  Filled 2018-01-08 (×2): qty 1

## 2018-01-08 MED ORDER — ASPIRIN 81 MG PO CHEW: 81.0000 mg | CHEWABLE_TABLET | Freq: Every day | ORAL | Status: DC

## 2018-01-08 MED ORDER — ADULT MULTIVITAMIN LIQUID CH
15.0000 mL | Freq: Every day | ORAL | Status: DC
  Administered 2018-01-09 – 2018-01-20 (×12): 15 mL
  Filled 2018-01-08 (×13): qty 15

## 2018-01-08 MED ORDER — LORAZEPAM 2 MG/ML IJ SOLN: 2.0000 mg | Freq: Once | INTRAMUSCULAR | 0 refills | Status: DC | PRN

## 2018-01-08 MED ORDER — LEVETIRACETAM 100 MG/ML PO SOLN
1000.0000 mg | Freq: Two times a day (BID) | ORAL | Status: DC
  Administered 2018-01-09 – 2018-01-20 (×24): 1000 mg
  Filled 2018-01-08 (×25): qty 10

## 2018-01-08 MED ORDER — LABETALOL HCL 300 MG PO TABS
300.0000 mg | ORAL_TABLET | Freq: Three times a day (TID) | ORAL | Status: DC
  Administered 2018-01-08 – 2018-01-20 (×35): 300 mg via ORAL
  Filled 2018-01-08 (×36): qty 1

## 2018-01-08 MED ORDER — FAMOTIDINE 40 MG/5ML PO SUSR: 20.0000 mg | Freq: Two times a day (BID) | ORAL | 0 refills | Status: DC

## 2018-01-08 MED ORDER — FOLIC ACID 1 MG PO TABS: 1.0000 mg | ORAL_TABLET | Freq: Every day | ORAL | Status: DC

## 2018-01-08 MED ORDER — LABETALOL HCL 300 MG PO TABS: 300.0000 mg | ORAL_TABLET | Freq: Three times a day (TID) | ORAL | Status: DC

## 2018-01-08 MED ORDER — THIAMINE HCL 100 MG PO TABS: 100.0000 mg | ORAL_TABLET | Freq: Every day | ORAL | Status: DC

## 2018-01-08 MED ORDER — FREE WATER
150.0000 mL | Freq: Four times a day (QID) | Status: DC
  Administered 2018-01-08 – 2018-01-09 (×2): 150 mL

## 2018-01-08 MED ORDER — FERROUS SULFATE 325 (65 FE) MG PO TABS
325.0000 mg | ORAL_TABLET | Freq: Two times a day (BID) | ORAL | Status: DC
  Administered 2018-01-08 – 2018-01-20 (×24): 325 mg via ORAL
  Filled 2018-01-08 (×24): qty 1

## 2018-01-08 MED ORDER — ACETAMINOPHEN 325 MG PO TABS: 650.0000 mg | ORAL_TABLET | ORAL | Status: DC | PRN

## 2018-01-08 MED ORDER — SENNOSIDES-DOCUSATE SODIUM 8.6-50 MG PO TABS: 1.0000 | ORAL_TABLET | Freq: Every evening | ORAL | Status: DC | PRN

## 2018-01-08 MED ORDER — AMLODIPINE BESYLATE 10 MG PO TABS
10.0000 mg | ORAL_TABLET | Freq: Every day | ORAL | Status: DC
  Administered 2018-01-09 – 2018-01-20 (×12): 10 mg via ORAL
  Filled 2018-01-08 (×12): qty 1

## 2018-01-08 MED ORDER — PSYLLIUM 95 % PO PACK: 1.0000 | PACK | Freq: Two times a day (BID) | ORAL | Status: DC

## 2018-01-08 MED ORDER — ADULT MULTIVITAMIN LIQUID CH: 15.0000 mL | Freq: Every day | ORAL | Status: DC

## 2018-01-08 MED ORDER — FREE WATER: 150.0000 mL | Freq: Four times a day (QID) | Status: DC

## 2018-01-08 MED ORDER — FERROUS SULFATE 325 (65 FE) MG PO TABS: 325.0000 mg | ORAL_TABLET | Freq: Two times a day (BID) | ORAL | 3 refills | Status: DC

## 2018-01-08 MED ORDER — VITAMIN B-1 100 MG PO TABS
100.0000 mg | ORAL_TABLET | Freq: Every day | ORAL | Status: DC
  Administered 2018-01-09 – 2018-01-20 (×12): 100 mg
  Filled 2018-01-08 (×12): qty 1

## 2018-01-08 MED ORDER — ENSURE ENLIVE PO LIQD: 237.0000 mL | Freq: Two times a day (BID) | ORAL | 12 refills | Status: DC

## 2018-01-08 MED ORDER — CHLORHEXIDINE GLUCONATE 0.12% ORAL RINSE (MEDLINE KIT)
15.0000 mL | Freq: Two times a day (BID) | OROMUCOSAL | Status: DC
  Administered 2018-01-08 – 2018-01-20 (×15): 15 mL via OROMUCOSAL

## 2018-01-08 MED ORDER — DOCUSATE SODIUM 50 MG/5ML PO LIQD: 100.0000 mg | Freq: Two times a day (BID) | ORAL | 0 refills | Status: DC

## 2018-01-08 MED ORDER — HEPARIN SODIUM (PORCINE) 5000 UNIT/ML IJ SOLN: 5000.0000 [IU] | Freq: Three times a day (TID) | INTRAMUSCULAR | Status: DC

## 2018-01-08 MED ORDER — ASPIRIN 81 MG PO CHEW
81.0000 mg | CHEWABLE_TABLET | Freq: Every day | ORAL | Status: DC
  Administered 2018-01-09 – 2018-01-20 (×12): 81 mg
  Filled 2018-01-08 (×12): qty 1

## 2018-01-08 MED ORDER — HEPARIN SODIUM (PORCINE) 5000 UNIT/ML IJ SOLN
5000.0000 [IU] | Freq: Three times a day (TID) | INTRAMUSCULAR | Status: DC
  Administered 2018-01-08 – 2018-01-20 (×35): 5000 [IU] via SUBCUTANEOUS
  Filled 2018-01-08 (×36): qty 1

## 2018-01-08 MED ORDER — PSYLLIUM 95 % PO PACK
1.0000 | PACK | Freq: Two times a day (BID) | ORAL | Status: DC
  Administered 2018-01-08 – 2018-01-20 (×23): 1 via ORAL
  Filled 2018-01-08 (×25): qty 1

## 2018-01-08 MED ORDER — AMLODIPINE BESYLATE 10 MG PO TABS: 10.0000 mg | ORAL_TABLET | Freq: Every day | ORAL | Status: DC

## 2018-01-08 MED ORDER — LEVETIRACETAM 100 MG/ML PO SOLN: 1000.0000 mg | Freq: Two times a day (BID) | ORAL | 12 refills | Status: DC

## 2018-01-08 MED ORDER — FAMOTIDINE 40 MG/5ML PO SUSR
20.0000 mg | Freq: Two times a day (BID) | ORAL | Status: DC
  Administered 2018-01-08 – 2018-01-20 (×24): 20 mg
  Filled 2018-01-08 (×25): qty 2.5

## 2018-01-08 MED ORDER — ENSURE ENLIVE PO LIQD
237.0000 mL | Freq: Two times a day (BID) | ORAL | Status: DC
  Administered 2018-01-09: 237 mL via ORAL

## 2018-01-08 NOTE — Care Management Note (Signed)
Case Management Note  Patient Details  Name: Tammie Miles MRN: [id number] Date of Birth: Dec 29, 1975  Subjective/Objective:                    Action/Plan: Patient is discharging to CIR today. CM signing off.   Expected Discharge Date:  01/08/18               Expected Discharge Plan:  Skilled Nursing Facility  In-House Referral:  Clinical Social Work  Discharge planning Services  CM Consult  Post Acute Care Choice:    Choice offered to:     DME Arranged:    DME Agency:     HH Arranged:    HH Agency:     Status of Service:  Completed, signed off  If discussed at [employer], dates discussed:    Additional Comments:  Kermit Balo, RN 01/08/2018, 10:24 AM

## 2018-01-08 NOTE — H&P (Signed)
Physical Medicine and Rehabilitation Admission H&P        Chief Complaint  Patient presents with  . Altered Mental Status  : HPI: Tammie Miles is a 42 year old right-handed female history of hypertension, anxiety/depression and alcohol use.  Per chart review patient lives with her boyfriend has an 56-year-old child independent prior to admission.  Presented 11/27/2017 with altered mental status and became obtunded.  Urine drug screen positive amphetamines(patient does take Adderall at home), alcohol negative, lactic acid 4.3.  Cranial CT scan showed a large area of hemorrhage in the left basal ganglia and left frontal lobe, 5.8 x 3.5 cm with a 15 mm left to right midline shift.  CT angiogram of the head positive for left MCA M1 large vessel occlusion and also severe stenosis at the left ICA terminus.  Negative for intracranial aneurysm.  Neurosurgery Dr. Kathyrn Sheriff consulted and initially underwent left frontotemporal craniotomy for evacuation of hematoma 11/27/2017 followed by left decompressive craniectomy with skull flap placed in abdomen 11/28/2017 due to elevated ICP.    Postoperative changes from craniotomy 8 to 9 mm right to left midline shift.  Echocardiogram with ejection fraction 65%.  Lower extremity Dopplers negative.  An EEG completed 12/04/2017 showed no seizure and maintained on Keppra.  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  Mild increase edema of infarct with increased herniation of the left frontal lobe via the left frontal craniectomy.  Stable 6 mm left to right midline shift and partial effacement of left lateral ventricle.  Patient was later placed on aspirin for CVA prophylaxis.  Hospital course with prolonged intubation and underwent tracheostomy tube placed by Dr. Nelda Marseille 12/09/2017 and curre   ntly with a #4 cuffless trach and no current plan to decannulate at this time.  Speech therapy is working with PMV.  Capping trials underway per pulmonary services  with minimal secretions await plan for possible decannulation 01/09/2018.  PEG tube placement 12/08/2017 per Dr. Georganna Skeans.  Dysphasia #1 thin liquid diet.  Subcutaneous heparin initiated for DVT prophylaxis 12/15/2017.  Ongoing bouts of restlessness.  Therapy evaluations completed with recommendations of need for inpatient rehab services.  Patient was admitted for a comprehensive rehab program.   Review of Systems  Unable to perform ROS: Acuity of condition        Past Medical History:  Diagnosis Date  . ADHD    . Anxiety    . Depression    . Hypertension       The histories are not reviewed yet. Please review them in the "History" navigator section and refresh this [place].      Family History  Problem Relation Age of Onset  . Hypertension Mother    . Hypertension Father      Social History:  reports that she drinks alcohol. Her tobacco and drug histories are not on file. Allergies: No Known Allergies       Medications Prior to Admission  Medication Sig Dispense Refill  . ALPRAZolam (XANAX) 1 MG tablet Take 1 mg by mouth 3 (three) times daily.    5  . amphetamine-dextroamphetamine (ADDERALL) 30 MG tablet Take 15-30 mg by mouth See admin instructions. Take one tablet (30 mg) by mouth every morning and at noon, take 1/2 tablet (15 mg) daily at 4pm   0      Drug Regimen Review Drug regimen was reviewed and remains appropriate with no significant issues identified   Home: Home Living Family/patient expects to be discharged to::  Unsure Additional Comments: pt has a boyfriend and an 89 y/o child (by boyfriend)   Functional History: Prior Function Level of Independence: Independent Comments: likely independent, but no family around to get PLOF   Functional Status:  Mobility: Bed Mobility Overal bed mobility: Needs Assistance Bed Mobility: Supine to Sit Rolling: Mod assist, Min assist(Min to roll R and and mod assistance to roll L.  ) Sidelying to sit: Mod assist Supine  to sit: Min assist Sit to supine: Max assist General bed mobility comments: increased time for management of R side, min assist for R UE but able to initate ROM of R LE towards EOB without assist  Transfers Overall transfer level: Needs assistance Equipment used: Rolling walker (2 wheeled)(R hand support) Transfer via Lift Equipment: Stedy Transfers: Sit to/from Stand Sit to Stand: Min assist, +2 safety/equipment General transfer comment: cueing for hand placement and safety, requires assist to raise R UE to splint on RW and place velcro strap  Ambulation/Gait Ambulation/Gait assistance: Mod assist, +2 safety/equipment Gait Distance (Feet): 45 Feet Assistive device: Rolling walker (2 wheeled) Gait Pattern/deviations: Step-through pattern, Decreased stride length, Shuffle, Decreased step length - right, Trunk flexed, Narrow base of support, Scissoring General Gait Details: Pt continues to present with hemiparetic gait but able to progress steps on R side without assistance.  Pt performed turning with assistance to move RW and maintain balance.  Pt required cues to increase stride on R , increase BOS, and assistance to maintain alignment of RW.     ADL: ADL Overall ADL's : Needs assistance/impaired Eating/Feeding: NPO Grooming: Minimal assistance, Standing, Oral care(+2 safety ) Grooming Details (indicate cue type and reason): hand over hand support to brush rather than bite sponge, 1 UE support standing  Upper Body Bathing: Total assistance, Bed level Lower Body Bathing: Total assistance, Bed level Upper Body Dressing : Total assistance, Bed level Lower Body Dressing: Total assistance, Sit to/from stand Lower Body Dressing Details (indicate cue type and reason): able to raise L LE and follow commands for therapist to don sock Toilet Transfer: Minimal assistance, +2 for safety/equipment, Ambulation, RW(simulated to recliner) Toileting- Clothing Manipulation and Hygiene: Total assistance,  Bed level Functional mobility during ADLs: Moderate assistance, Minimal assistance, +2 for safety/equipment, Rolling walker, Cueing for safety, Cueing for sequencing(min assist for transfers, mod assist for mobility) General ADL Comments: pt demonstrates increased mobility and following commands today, requires increased time for all tasks    Cognition: Cognition Overall Cognitive Status: Difficult to assess Arousal/Alertness: Awake/alert Orientation Level: Oriented to person Attention: Focused Focused Attention: Impaired Cognition Arousal/Alertness: Awake/alert Behavior During Therapy: Flat affect, Impulsive Overall Cognitive Status: Difficult to assess Area of Impairment: Following commands, Safety/judgement, Problem solving Following Commands: Follows one step commands with increased time, Follows one step commands inconsistently Safety/Judgement: Decreased awareness of safety Problem Solving: Slow processing, Difficulty sequencing, Requires verbal cues, Requires tactile cues General Comments: pt able to follow commands 75% of the time, given increased time; presents with R UE inattention Difficult to assess due to: Impaired communication, Tracheostomy   Physical Exam: Blood pressure (!) 146/101, pulse 90, temperature 98.2 F (36.8 C), temperature source Axillary, resp. rate 18, height 5' 1"  (1.549 m), weight 67.3 kg, SpO2 97 %. Physical Exam  Vitals reviewed. Eyes:  Pupils reactive to light  Neck:  Tracheostomy tube in place , #4 Sh[place] Cardiovascular: Normal rate, regular rhythm and normal heart sounds.  Respiratory:  Limited inspiratory effort but clear to auscultation  GI: Soft. Bowel sounds are normal. She  exhibits no distension.  Gastrostomy tube in place  Neurological:  Patient makes eye contact.  Nonverbal.  She would not follow commands.   Motor strength is 0/5 in the right deltoid bicep tricep grip hip flexor knee extensor ankle dorsiflexor 4+ in the left deltoid  by stress of grip hip flexor knee extensor ankle dorsiflexor, question full effort Sensation winces to pinch on left side as well as in right lower extremity, no reaction to pinch right upper extremity  Patient is able to follow gestures but not verbal commands. She is attentive and cooperative Lab Results Last 48 Hours        Results for orders placed or performed during the hospital encounter of 11/27/17 (from the past 48 hour(s))  CBC     Status: Abnormal    Collection Time: 01/04/18  2:34 PM  Result Value Ref Range    WBC 7.3 4.0 - 10.5 K/uL    RBC 4.21 3.87 - 5.11 MIL/uL    Hemoglobin 10.6 (L) 12.0 - 15.0 g/dL    HCT 35.5 (L) 36.0 - 46.0 %    MCV 84.3 80.0 - 100.0 fL    MCH 25.2 (L) 26.0 - 34.0 pg    MCHC 29.9 (L) 30.0 - 36.0 g/dL    RDW 21.4 (H) 11.5 - 15.5 %    Platelets 285 150 - 400 K/uL    nRBC 0.0 0.0 - 0.2 %      Comment: Performed at [facility], [place] [address], [place], [place] [address]  Basic metabolic panel     Status: Abnormal    Collection Time: 01/04/18  2:34 PM  Result Value Ref Range    Sodium 139 135 - 145 mmol/L    Potassium 4.1 3.5 - 5.1 mmol/L    Chloride 105 98 - 111 mmol/L    CO2 24 22 - 32 mmol/L    Glucose, Bld 108 (H) 70 - 99 mg/dL    BUN 14 6 - 20 mg/dL    Creatinine, Ser 0.82 0.44 - 1.00 mg/dL    Calcium 9.8 8.9 - 10.3 mg/dL    GFR calc non Af Amer >60 >60 mL/min    GFR calc Af Amer >60 >60 mL/min      Comment: (NOTE) The eGFR has been calculated using the CKD EPI equation. This calculation has not been validated in all clinical situations. eGFR's persistently <60 mL/min signify possible Chronic Kidney Disease.      Anion gap 10 5 - 15      Comment: Performed at [facility] [address], [place], [place] [address]      Imaging Results (Last 48 hours)  No results found.           Medical Problem List and Plan: 1.   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  DVT Prophylaxis/Anticoagulation: Subcutaneous heparin 12/15/2017.  Venous Doppler studies negative 3. Pain Management: Tylenol as needed 4. Mood/history of ADD: Patient had been on Adderall prior to admission.  Provide emotional support 5. Neuropsych: This patient is not capable of making decisions on her own behalf. 6. Skin/Wound Care: Routine skin checks 7. Fluids/Electrolytes/Nutrition: Routine in and outs with follow-up chemistries 8.  Seizure prophylaxis.  Keppra 1000 mg every 12 hours 9.  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  Presently #4 cuffless trach.  Patient tolerating capping trials  no signs of distress minimal secretions.  Await plan for possible decannulation 01/09/2018.  Follow-up pulmonary services.   10.  Dysphagia.  Dysphagia #1 thin liquid diet.  PEG tube 12/08/2017.  Continue supplement as needed 11.  Hypertension.  Norvasc 10 mg daily, labetalol 300 mg 3 times daily.  Monitor with increased mobility     Post Admission Physician Evaluation: 1. 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 2. Patient admitted to receive collaborative, interdisciplinary care between the physiatrist, rehab nursing staff, and therapy team. 3. Patient's level of medical complexity and substantial therapy needs in context of that medical necessity cannot be provided at a lesser intensity of care. 4. Patient has experienced substantial functional loss from his/her baseline..  Judging by the patient's diagnosis, physical exam, and functional history, the patient has potential for functional progress which will result in measurable gains while on inpatient rehab.  These gains will be of substantial and practical use upon discharge in facilitating mobility and self-care at the household level. 5. Physiatrist will provide 24  hour management of medical needs as well as oversight of the therapy plan/treatment and provide guidance as appropriate regarding the interaction of the two. 6. 24 hour rehab nursing will assist in the management of  bladder management, bowel management, safety, skin/wound care, disease management, medication administration, pain management and patient education  and help integrate therapy concepts, techniques,education, etc. 7. PT will assess and treat for:pre gait, gait training, endurance , safety, equipment, neuromuscular re education  .  Goals are: minimal assist. 8. OT will assess and treat for ADLs, Cognitive perceptual skills, Neuromuscular re education, safety, endurance, equipment  .  Goals are: minimal assist.  9. SLP will assess and treat for language, apraxia, dysphagia  .  Goals are: minimal assist. 10. Case Management and Social Worker will assess and treat for psychological issues and discharge planning. 11. Team conference will be held weekly to assess progress toward goals and to determine barriers to discharge. 12.  Patient will receive at least 3 hours of therapy per day at least 5 days per week. 13. ELOS and Prognosis: 21 to 24 days good   "I have personally performed a face to face diagnostic evaluation of this patient.  Additionally, I have reviewed and concur with the physician assistant's documentation above."  Charlett Blake M.D. [facility] FAAPM&R (Sports Med, Neuromuscular Med) Diplomate Am Board of Electrodiagnostic Med    Cathlyn Parsons, PA-C 01/06/2018

## 2018-01-08 NOTE — Discharge Summary (Addendum)
Stroke Discharge Summary  Patient ID: Tammie Miles   MRN: [id number]      DOB: Sep 14, 1975  Date of Admission: 11/27/2017 Date of Discharge: 01/08/2018  Attending Physician:  Garvin Fila, MD, Stroke MD Consultant(s):   Consuella Lose, MD (neurosurgery), Maurine Cane, MD (pulmonary/intensive care), Brigid Re, MD (general surgery/trauma), Inpatient Rehab team Patient's PCP:  Patient, No Pcp Per  Discharge Diagnoses:  Principal Problem:   Anterior cerebral circulation hemorrhagic infarction [facility]) Active Problems:   ICH (intracerebral hemorrhage) ([place])   Respiratory failure ([place])   Status post craniectomy   Hypertensive emergency   Anemia   Leukocytosis   Altered mental status   Central venous catheter in place   Pressure injury of skin   Cerebral edema ([place])   At high risk for injury related to fall   Seizures ([place])   Attention deficit hyperactivity disorder (ADHD)   Generalized anxiety disorder   Depression  Past Medical History:  Diagnosis Date  . ADHD   . Anxiety   . Depression   . Hypertension    Past Surgical History:  Procedure Laterality Date  . CRANIOTOMY Left 11/27/2017   Procedure: CRANIOTOMY for Evacuation of Intracranial Hemorrhage;  Surgeon: Consuella Lose, MD;  Location: [place];  Service: Neurosurgery;  Laterality: Left;  CRANIOTOMY for Evacuation of Intracranial Hemorrhage  . CRANIOTOMY Left 11/28/2017   Procedure: Decompressive craniectomy;  Surgeon: Erline Levine, MD;  Location: [place];  Service: Neurosurgery;  Laterality: Left;  . ESOPHAGOGASTRODUODENOSCOPY N/A 12/09/2017   Procedure: ESOPHAGOGASTRODUODENOSCOPY (EGD);  Surgeon: Georganna Skeans, MD;  Location: [facility] ENDOSCOPY;  Service: General;  Laterality: N/A;  . PEG PLACEMENT N/A 12/09/2017   Procedure: PERCUTANEOUS ENDOSCOPIC GASTROSTOMY (PEG) PLACEMENT;  Surgeon: Georganna Skeans, MD;  Location: [place];  Service: General;  Laterality: N/A;    Medications to be continued on  Rehab Allergies as of 01/08/2018   No Known Allergies     Medication List    STOP taking these medications   ALPRAZolam 1 MG tablet Commonly known as:  XANAX   amphetamine-dextroamphetamine 30 MG tablet Commonly known as:  ADDERALL     TAKE these medications   acetaminophen 325 MG tablet Commonly known as:  TYLENOL Take 2 tablets (650 mg total) by mouth every 4 (four) hours as needed for mild pain (or temp > 37.5 C (99.5 F)).   amLODipine 10 MG tablet Commonly known as:  NORVASC Take 1 tablet (10 mg total) by mouth daily.   aspirin 81 MG chewable tablet Place 1 tablet (81 mg total) into feeding tube daily.   docusate 50 MG/5ML liquid Commonly known as:  COLACE Place 10 mLs (100 mg total) into feeding tube 2 (two) times daily.   famotidine 40 MG/5ML suspension Commonly known as:  PEPCID Place 2.5 mLs (20 mg total) into feeding tube 2 (two) times daily.   feeding supplement (ENSURE ENLIVE) Liqd Take 237 mLs by mouth 2 (two) times daily between meals.   ferrous sulfate 325 (65 FE) MG tablet Take 1 tablet (325 mg total) by mouth 2 (two) times daily.   folic acid 1 MG tablet Commonly known as:  FOLVITE Place 1 tablet (1 mg total) into feeding tube daily.   free water Soln Place 150 mLs into feeding tube every 6 (six) hours.   heparin 5000 UNIT/ML injection Inject 1 mL (5,000 Units total) into the skin every 8 (eight) hours.   labetalol 300 MG tablet Commonly known as:  NORMODYNE Take 1  tablet (300 mg total) by mouth 3 (three) times daily.   levETIRAcetam 100 MG/ML solution Commonly known as:  KEPPRA Place 10 mLs (1,000 mg total) into feeding tube every 12 (twelve) hours.   LORazepam 2 MG/ML injection Commonly known as:  ATIVAN Inject 1 mL (2 mg total) into the vein once as needed for seizure.   multivitamin Liqd Place 15 mLs into feeding tube daily.   psyllium 95 % Pack Commonly known as:  HYDROCIL/METAMUCIL Take 1 packet by mouth 2 (two) times daily.    senna-docusate 8.6-50 MG tablet Commonly known as:  Senokot-S Take 1 tablet by mouth at bedtime as needed for mild constipation.   thiamine 100 MG tablet Place 1 tablet (100 mg total) into feeding tube daily.       LABORATORY STUDIES CBC    Component Value Date/Time   WBC 7.3 01/04/2018 1434   RBC 4.21 01/04/2018 1434   HGB 10.6 (L) 01/04/2018 1434   HCT 35.5 (L) 01/04/2018 1434   PLT 285 01/04/2018 1434   MCV 84.3 01/04/2018 1434   MCH 25.2 (L) 01/04/2018 1434   MCHC 29.9 (L) 01/04/2018 1434   RDW 21.4 (H) 01/04/2018 1434   LYMPHSABS 2.4 12/14/2017 0256   MONOABS 0.8 12/14/2017 0256   EOSABS 0.3 12/14/2017 0256   BASOSABS 0.1 12/14/2017 0256   CMP    Component Value Date/Time   NA 139 01/04/2018 1434   K 4.1 01/04/2018 1434   CL 105 01/04/2018 1434   CO2 24 01/04/2018 1434   GLUCOSE 108 (H) 01/04/2018 1434   BUN 14 01/04/2018 1434   CREATININE 0.82 01/04/2018 1434   CALCIUM 9.8 01/04/2018 1434   PROT 7.4 12/28/2017 0435   ALBUMIN 3.2 (L) 12/28/2017 0435   AST 19 12/28/2017 0435   ALT 23 12/28/2017 0435   ALKPHOS 99 12/28/2017 0435   BILITOT 0.6 12/28/2017 0435   GFRNONAA >60 01/04/2018 1434   GFRAA >60 01/04/2018 1434   COAGS Lab Results  Component Value Date   INR 1.13 12/09/2017   INR 1.09 12/08/2017   INR 1.13 12/07/2017   Lipid Panel    Component Value Date/Time   CHOL 138 11/28/2017 0832   TRIG 414 (H) 11/30/2017 1658   HDL 42 11/28/2017 0832   CHOLHDL 3.3 11/28/2017 0832   VLDL 32 11/28/2017 0832   LDLCALC 64 11/28/2017 0832   HgbA1C  Lab Results  Component Value Date   HGBA1C 5.8 (H) 11/28/2017   Urinalysis    Component Value Date/Time   COLORURINE YELLOW 11/30/2017 0929   APPEARANCEUR CLEAR 11/30/2017 0929   LABSPEC 1.014 11/30/2017 0929   PHURINE 6.0 11/30/2017 0929   GLUCOSEU NEGATIVE 11/30/2017 0929   HGBUR NEGATIVE 11/30/2017 0929   BILIRUBINUR NEGATIVE 11/30/2017 0929   KETONESUR NEGATIVE 11/30/2017 0929   PROTEINUR  NEGATIVE 11/30/2017 0929   UROBILINOGEN 1.0 10/28/2009 1124   NITRITE NEGATIVE 11/30/2017 0929   LEUKOCYTESUR NEGATIVE 11/30/2017 0929   Urine Drug Screen     Component Value Date/Time   LABOPIA NONE DETECTED 11/27/2017 1415   COCAINSCRNUR NONE DETECTED 11/27/2017 1415   LABBENZ NONE DETECTED 11/27/2017 1415   AMPHETMU POSITIVE (A) 11/27/2017 1415   THCU NONE DETECTED 11/27/2017 1415   LABBARB NONE DETECTED 11/27/2017 1415    Alcohol Level    Component Value Date/Time   ETH <10 11/27/2017 0948     SIGNIFICANT DIAGNOSTIC STUDIES Ct Angio Head W Or Wo Contrast  Addendum Date: 11/27/2017   ADDENDUM REPORT: 11/27/2017 11:17 ADDENDUM:  Study discussed by telephone with Neurosurgeon Dr. Consuella Lose on 11/27/2017 at 1108 hours. Electronically Signed   By: Genevie Ann M.D.   On: 11/27/2017 11:17   Result Date: 11/27/2017 CLINICAL DATA:  42 year old female found unresponsive with relatively large intra-axial hemorrhage in the left hemisphere on noncontrast head CT. EXAM: CT ANGIOGRAPHY HEAD TECHNIQUE: Multidetector CT imaging of the head was performed using the standard protocol during bolus administration of intravenous contrast. Multiplanar CT image reconstructions and MIPs were obtained to evaluate the vascular anatomy. CONTRAST:  59m ISOVUE-370 IOPAMIDOL (ISOVUE-370) INJECTION 76% COMPARISON:  Head CT without contrast 0938 hours today. FINDINGS: Posterior circulation: Mildly dominant distal left vertebral artery. Patent vertebral arteries to the vertebrobasilar junction without stenosis. Patent left PICA and dominant appearing right AICA origins. Patent basilar artery without stenosis. Mild tortuosity at the basilar tip. SCA and PCA origins are within normal limits. Posterior communicating arteries are diminutive or absent. Bilateral PCA branches are within normal limits. Anterior circulation: Tortuous and dolichoectatic distal cervical right ICA with fusiform aneurysmal enlargement up to 8  millimeters just below the skull base (series 7, image 9). Smooth tapering of the vessel. No right ICA siphon atherosclerosis or stenosis identified. Patent right ICA terminus. Normal right ophthalmic artery origin. Similar but lesser caliber variation in the distal left ICA which measures up to 6 millimeters diameter (coronal series 7, images 88 and 89. No left ICA siphon atherosclerosis, but there is moderate to severe vessel wall irregularity and stenosis in the supraclinoid segment (series 9, image 92 best demonstrated series 9, images 92-95. Despite this the left ICA terminus is patent. Both ACA A1 segments are diminutive but patent. There is mass effect on the left scratched at there is mass effect on the bilateral ACA branches which appear patent and within normal limits. The right MCA M1 segment, right MCA trifurcation, and right MCA branches are within normal limits. The left MCA M1 segment is occluded about 6 millimeters beyond its origin (series 8, image 16) with poor reconstituted enhancement in the left MCA branches. No CTA spot sign is identified. No abnormal increased vascularity in the area of left hemisphere hematoma. Venous sinuses: Early contrast timing, but grossly patent superior sagittal sinus. Anatomic variants: Dominant distal left vertebral artery. Other findings: Estimated left hemisphere intra-axial hemorrhage volume 43 milliliters (60 x 33 x 43 millimeters AP by transverse by CC). No intraventricular or extra-axial extension of blood is evident. Intracranial mass effect appears stable with rightward midline shift up to 16 millimeters. Substantial mass effect on the lateral ventricles redemonstrated. The right lateral ventricle appears trapped. Review of the MIP images confirms the above findings IMPRESSION: 1. Positive for left MCA M1 large vessel occlusion, and also severe stenosis at the left ICA terminus, superimposed on the relatively large acute left hemisphere intra-axial hemorrhage  (estimated blood volume 43 mL). Vasospasm suspected in the proximal ACAs. 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 2. Negative for intracranial aneurysm, CTA spot sign, or evidence of vascular malformation in association with the acute hemorrhage. 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 3. Stable intracranial mass effect since 0938 hours today, with 16 mm of leftward midline shift and trapping of the right lateral ventricle. Study briefly discussed by telephone with Dr. CVenora Maplesin the ED on 11/27/2017 at 10:55. He advises the patient has now been taken to the neuro  operating room, and I am now attempting to contact the Neurosurgeon regarding these findings. Electronically Signed: By: Genevie Ann M.D. On: 11/27/2017 10:58   Ct Head Wo Contrast  Result Date: 12/02/2017 CLINICAL DATA:  42 y/o  F; intracranial hemorrhage for follow-up. EXAM: CT HEAD WITHOUT CONTRAST TECHNIQUE: Contiguous axial images were obtained from the base of the skull through the vertex without intravenous contrast. COMPARISON:  11/29/2017 CT head. FINDINGS: Brain: Stable distribution of left MCA infarction. Mild interval increase in edema a mildly increased herniation of the left frontal lobe the craniectomy. Stable 6 mm left-to-right midline shift and partial effacement of the left lateral ventricle. Stable hematoma within the left basal ganglia post partial evacuation. No new acute intracranial hemorrhage, stroke, or focal mass effect. Vascular: No hyperdense vessel or unexpected calcification. Skull: Left frontal craniectomy postsurgical changes. Decreased edema and interval resolution of soft tissue air and pneumocephalus. Sinuses/Orbits: Aerosolized secretions in the right sphenoid sinus. Additional visualized paranasal sinuses and the mastoid air  cells are normally aerated. Other: None. IMPRESSION: 1. Left MCA distribution infarction is stable in distribution. Mild increased edema of the infarct with increased herniation of the left frontal lobe via the left frontal craniectomy. 2. Stable 6 mm left-to-right midline shift and partial effacement of left lateral ventricle. 3. Stable small hematoma within the left basal ganglia. 4. No new acute intracranial abnormality identified. Electronically Signed   By: Kristine Garbe M.D.   On: 12/02/2017 03:08   Ct Head Wo Contrast  Result Date: 11/29/2017 CLINICAL DATA:  42 y/o  F; stroke for follow-up. EXAM: CT HEAD WITHOUT CONTRAST TECHNIQUE: Contiguous axial images were obtained from the base of the skull through the vertex without intravenous contrast. COMPARISON:  11/27/2017 MRI head.  11/28/2017 CT head. FINDINGS: Brain: Large left MCA infarction is stable in distribution. Interval left frontal craniectomy with partial herniation of the left frontal lobe through the craniectomy defect. Interval resolution of uncal herniation, improved patency of the left lateral ventricle, and 6 mm left-to-right midline shift, previously 8 mm. Partial interval dispersion of acute hemorrhage in the left basal ganglia post partial evacuation. There is a subcentimeter density anterior to the prior position of blood products possibly representing retracted clot or interval hemorrhage (series 3, image 19), attention at follow-up recommended. Vascular: No hyperdense vessel or unexpected calcification. Skull: Interval left frontal craniectomy with edema and several foci of air in the overlying scalp as well as skin staples. Sinuses/Orbits: Possible right sphenoid sinus opacification with aerosolized secretions. Normal aeration of the mastoid air cells. Other: None. IMPRESSION: 1. Large left MCA infarct is stable in distribution. 2. Interval left frontal craniectomy with mild herniation of left frontal lobe through the  defect. 3. Resolved uncal herniation, improved patency of left lateral ventricle, and 6 mm left-to-right midline shift, previously 8 mm. 4. Partial interval dispersion of acute hemorrhage in the left basal ganglia. Subcentimeter density anterior to the prior position of blood products possibly representing retracted clot or interval hemorrhage, attention at follow-up recommended. Electronically Signed   By: Kristine Garbe M.D.   On: 11/29/2017 06:00   Ct Head Wo Contrast  Result Date: 11/28/2017 CLINICAL DATA:  Stroke follow-up EXAM: CT HEAD WITHOUT CONTRAST TECHNIQUE: Contiguous axial images were obtained from the base of the skull through the vertex without intravenous contrast. COMPARISON:  Yesterday FINDINGS: Brain: Acute infarct in the entirety of the left MCA territory. There was superimposed hematoma requiring craniotomy. Residual blood products are stable at 18 mm on axial slices.  There is a very thin subdural hematoma along the right cerebral convexity, stable at 2 to 3 mm. Cytotoxic edema continues to cause 8 mm of midline shift at the septum pellucidum. No ventriculomegaly. No evidence of new infarct. Vascular: Negative Skull: Unremarkable left frontal craniotomy. Sinuses/Orbits: Small volume secretions in the right sphenoid sinus IMPRESSION: 1. Stable when compared to yesterday. 2. Left MCA territory infarct with evacuated hematoma. Midline shift measures 8 mm. 3. 2-3 mm subdural hematoma along the right cerebral convexity without interval increase. Electronically Signed   By: Monte Fantasia M.D.   On: 11/28/2017 09:20   Ct Head Wo Contrast  Result Date: 11/27/2017 CLINICAL DATA:  Follow up intracranial hemorrhage. Status post LEFT frontotemporal craniotomy. EXAM: CT HEAD WITHOUT CONTRAST TECHNIQUE: Contiguous axial images were obtained from the base of the skull through the vertex without intravenous contrast. COMPARISON:  CT HEAD November 27, 2017 FINDINGS: BRAIN: Interval  evacuation LEFT basal ganglia hematoma was small amount of residual blood products with extending along the surgical approach. Decreased surrounding edema. Small amount of LEFT frontal intraparenchymal and extra-axial pneumocephalus. 7 mm LEFT-to-RIGHT midline shift, decreased from 15 mm. Re-expanded LEFT lateral ventricle. Resolved RIGHT ventricular entrapment. No hydrocephalus. No acute large vascular territory infarcts. Trace RIGHT holo hemispheric and tentorial subdural hematoma. Global edema with effaced basal cisterns. VASCULAR: Unremarkable. SKULL/SOFT TISSUES: Status post LEFT frontal craniotomy. LEFT frontal hemangioma. LEFT scalp soft tissue swelling with subcutaneous gas and overlying skin staples. No significant soft tissue swelling. ORBITS/SINUSES: The included ocular globes and orbital contents are normal.Lobulated RIGHT sphenoid sinus mucosal thickening with small air-fluid level. Mastoid air cells are well aerated. OTHER: None. IMPRESSION: 1. Interval LEFT frontal craniotomy for basal ganglia hematoma evacuation, small amount of residual blood products and regional edema. 7 mm residual LEFT to RIGHT midline shift, resolved RIGHT ventricular entrapment. 2. Trace RIGHT holo hemispheric and tentorial subdural hematomas. Global edema. Electronically Signed   By: Elon Alas M.D.   On: 11/27/2017 17:02   Ct Head Wo Contrast  Result Date: 11/27/2017 CLINICAL DATA:  Altered mental status EXAM: CT HEAD WITHOUT CONTRAST TECHNIQUE: Contiguous axial images were obtained from the base of the skull through the vertex without intravenous contrast. COMPARISON:  05/22/2009 FINDINGS: Brain: Large area of hemorrhage in the region of the left basal ganglia and extending into the left frontal lobe measuring approximately 5.8 x 3.5 cm. There is 15 mm of left-to-right midline shift. No hydrocephalus. Vascular: No hyperdense vessel or unexpected calcification. Skull: No acute calvarial abnormality.  Sinuses/Orbits: Visualized paranasal sinuses and mastoids clear. Orbital soft tissues unremarkable. Other: None IMPRESSION: Large area of hemorrhage in the left basal ganglia and left frontal lobe, 5.8 x 3.5 cm. 15 mm of left-to-right midline shift. Critical Value/emergent results were called by telephone at the time of interpretation on 11/27/2017 at 9:56 am to Dr. Jola Schmidt , who verbally acknowledged these results. Electronically Signed   By: Rolm Baptise M.D.   On: 11/27/2017 09:57   Mr Brain Wo Contrast  Result Date: 11/28/2017 CLINICAL DATA:  Follow-up examination for intracranial hemorrhage. EXAM: MRI HEAD WITHOUT CONTRAST TECHNIQUE: Multiplanar, multiecho pulse sequences of the brain and surrounding structures were obtained without intravenous contrast. COMPARISON:  Prior CT and CTA from earlier the same day. FINDINGS: Brain: Postoperative changes from recent left frontal craniotomy for evacuation of left basal ganglia hematoma again seen. Residual fluid and blood products seen within the resection cavity with probable superimposed scattered foci of pneumocephalus. Overall, size relatively stable from  recent exams. Surrounding vasogenic edema and regional mass effect with attenuation of the adjacent left lateral ventricle. Persistent 8-9 mm of left-to-right midline shift with left uncal herniation (series 15, image 16). Basilar cisterns remain patent. No hydrocephalus or ventricular trapping. Additional small right holo hemispheric subdural hematoma measures up to 3 mm in maximal thickness. Extension along the tentorium again noted. Underlying extensive cytotoxic edema involving the majority of the left MCA territory with involvement of the left frontal, parietal, and temporal occipital lobes, consistent with underlying evolving acute left MCA territory infarct. Associated gyral swelling and edema throughout the area of infarction. No other parenchymal hemorrhage outside the area of basal ganglia  hemorrhage. No underlying mass lesion. Remainder of the brain is relatively normal in appearance. Pituitary gland normal. Vascular: Major intracranial vascular flow voids are maintained Skull and upper cervical spine: Craniocervical junction within normal limits. No transtentorial herniation. Upper cervical spine normal. No focal marrow replacing lesion. Sequelae of prior left frontal craniotomy. Skin staples remain in place. Sinuses/Orbits: Globes and orbital soft tissues within normal limits. Right sphenoid sinus disease noted. Paranasal sinuses are otherwise largely clear. Trace right mastoid effusion, of doubtful significance. Other: None. IMPRESSION: 1. Postoperative changes from previous left frontal craniotomy for evacuation of left basal ganglia hematoma. Residual blood products within the evacuation cavity with persistent surrounding vasogenic edema and regional mass effect. Associated 8-9 mm of right-to-left midline shift with mild left uncal herniation similar to previous. No hydrocephalus or ventricular trapping. 2. Underlying evolving large acute ischemic left MCA territory infarct involving the majority of the left MCA territory. 3. Trace right cerebral and tentorial subdural hematoma without mass effect. 4. Right sphenoid sinusitis. Electronically Signed   By: Jeannine Boga M.D.   On: 11/28/2017 02:01   Dg Chest Port 1 View  Result Date: 12/25/2017 CLINICAL DATA:  Lung crackles.  Tracheostomy. EXAM: PORTABLE CHEST 1 VIEW COMPARISON:  12/17/2017. FINDINGS: Tracheostomy tube in stable position. Cardiomegaly with normal pulmonary vascularity. Mild bibasilar atelectasis. No pleural effusion or pneumothorax. Degenerative change thoracic spine. IMPRESSION: 1.  Tracheostomy tube in stable position. 2.  Low lung volumes with mild bibasilar atelectasis. 3.  Stable cardiomegaly.  No pulmonary venous congestion. Electronically Signed   By: Marcello Moores  Register   On: 12/25/2017 09:34   Dg Chest Port 1  View  Result Date: 12/17/2017 CLINICAL DATA:  Cough. EXAM: PORTABLE CHEST 1 VIEW COMPARISON:  12/14/2017 FINDINGS: Tracheostomy tube remains well positioned. Cardiac silhouette is normal in size. No mediastinal or hilar masses. No convincing adenopathy. Clear lungs.  No pleural effusion or pneumothorax. Skeletal structures are grossly intact. IMPRESSION: No active disease. Electronically Signed   By: Lajean Manes M.D.   On: 12/17/2017 15:52   Dg Chest Port 1 View  Result Date: 12/14/2017 CLINICAL DATA:  Respiratory failure EXAM: PORTABLE CHEST 1 VIEW COMPARISON:  Chest radiograph from one day prior. FINDINGS: Tracheostomy tube tip overlies the tracheal air column at the thoracic inlet. Stable cardiomediastinal silhouette with top-normal heart size. No pneumothorax. No pleural effusion. Lungs appear clear, with no acute consolidative airspace disease and no pulmonary edema. IMPRESSION: Well-positioned tracheostomy tube. No active cardiopulmonary disease. Electronically Signed   By: Ilona Sorrel M.D.   On: 12/14/2017 07:16   Dg Chest Port 1 View  Result Date: 12/13/2017 CLINICAL DATA:  Respiratory failure EXAM: PORTABLE CHEST 1 VIEW COMPARISON:  12/11/2017 FINDINGS: Tracheostomy is unchanged. Cardiomegaly. Minimal right base atelectasis or developing infiltrate. Left lung clear. No effusions. IMPRESSION: New minimal right basilar density  could reflect atelectasis or developing infiltrate. Electronically Signed   By: Rolm Baptise M.D.   On: 12/13/2017 08:26   Dg Chest Port 1 View  Result Date: 12/11/2017 CLINICAL DATA:  42 year old female status post large left MCA territory infarct with hemorrhage. Status post surgical evacuation. EXAM: PORTABLE CHEST 1 VIEW COMPARISON:  12/09/2017 and earlier. FINDINGS: Portable AP semi upright view at 0542 hours. Endotracheal tube has been removed and tracheostomy has been placed. Enteric tube removed. Lung volumes and mediastinal contours are within normal limits.  Allowing for portable technique the lungs are clear. Negative visible bowel gas pattern. No acute osseous abnormality identified. IMPRESSION: 1. Extubated and enteric tube removed with tracheostomy tube placed. 2.  No acute cardiopulmonary abnormality. Electronically Signed   By: Genevie Ann M.D.   On: 12/11/2017 08:29   Dg Chest Port 1 View  Result Date: 12/09/2017 CLINICAL DATA:  Acute respiratory failure with hypoxia. EXAM: PORTABLE CHEST 1 VIEW COMPARISON:  Radiograph of December 08, 2017. FINDINGS: Stable cardiomediastinal silhouette. Endotracheal and nasogastric tubes are unchanged in position. Left subclavian catheter line has been removed. No pneumothorax or pleural effusion is noted. Both lungs are clear. The visualized skeletal structures are unremarkable. IMPRESSION: Endotracheal and nasogastric tubes are unchanged. Left subclavian catheter has been removed. No acute cardiopulmonary abnormality seen. Electronically Signed   By: Marijo Conception, M.D.   On: 12/09/2017 07:58   Dg Chest Port 1 View  Result Date: 12/08/2017 CLINICAL DATA:  Left frontal hemorrhage, respiratory failure, intubated patient. EXAM: PORTABLE CHEST 1 VIEW COMPARISON:  Portable chest x-ray of December 06, 2017 FINDINGS: The lungs are well-expanded. There is no focal infiltrate. The heart is top-normal in size. The pulmonary vascularity is not clearly engorged. There is no pleural effusion. The endotracheal tube tip projects 3.5 cm above the carina. The left subclavian venous catheter tip projects over the distal third of the SVC. The esophagogastric tube tip in proximal port project below the GE junction. IMPRESSION: Stable appearance of the chest. No pneumonia nor CHF. The support tubes are in reasonable position. Electronically Signed   By: David  Martinique M.D.   On: 12/08/2017 08:22   Dg Chest Port 1 View  Result Date: 12/06/2017 CLINICAL DATA:  Hypoxia EXAM: PORTABLE CHEST 1 VIEW COMPARISON:  December 03, 2017 FINDINGS:  Endotracheal tube tip is 4.7 cm above the carina. Central catheter tip is at the cavoatrial junction. Nasogastric tube tip and side port are below the diaphragm. No pneumothorax. There is no edema or consolidation. Heart is borderline enlarged with pulmonary vascularity normal. No adenopathy. No bone lesions. IMPRESSION: Tube and catheter positions as described without pneumothorax. Mild cardiac prominence. No edema or consolidation. Electronically Signed   By: Lowella Grip III M.D.   On: 12/06/2017 08:51   Dg Chest Port 1 View  Result Date: 12/03/2017 CLINICAL DATA:  Respiratory insufficiency EXAM: PORTABLE CHEST 1 VIEW COMPARISON:  12/01/2017 FINDINGS: Endotracheal tube and nasogastric catheter as well as a left subclavian central line are again seen and stable. The lungs are well aerated with mild bibasilar atelectasis slightly increased from the prior exam. Cardiac shadow is stable. No bony abnormality is seen. IMPRESSION: Mild bibasilar atelectasis slightly increased from the prior study. Electronically Signed   By: Inez Catalina M.D.   On: 12/03/2017 10:55   Dg Chest Port 1 View  Result Date: 12/01/2017 CLINICAL DATA:  Check endotracheal tube placement EXAM: PORTABLE CHEST 1 VIEW COMPARISON:  11/30/2017 FINDINGS: Cardiac shadow is prominent but stable.  Endotracheal tube, nasogastric catheter and left subclavian central line are again seen and stable. Very mild right basilar atelectasis is again noted. Bony structures are within normal limits. IMPRESSION: Mild right basilar atelectasis.  No acute abnormality noted. Electronically Signed   By: Inez Catalina M.D.   On: 12/01/2017 08:05   Dg Chest Port 1 View  Result Date: 11/30/2017 CLINICAL DATA:  Respiratory failure EXAM: PORTABLE CHEST 1 VIEW COMPARISON:  11/29/2017 FINDINGS: Lungs are essentially clear.  No pleural effusion or pneumothorax. Cardiomegaly. Endotracheal tube terminates 3 cm above the carina. Left subclavian venous catheter  terminates in the right atrium, 2 cm below the cavoatrial junction. Enteric tube courses into the stomach. IMPRESSION: Endotracheal tube terminates 3 cm above the carina. Left subclavian venous catheter terminates in the right atrium, 2 cm below the cavoatrial junction. Electronically Signed   By: Julian Hy M.D.   On: 11/30/2017 07:47   Dg Chest Port 1 View  Result Date: 11/29/2017 CLINICAL DATA:  Respiratory failure. EXAM: PORTABLE CHEST 1 VIEW COMPARISON:  Chest x-rays dated 11/28/2017 and 11/27/2017 FINDINGS: Endotracheal tube tip is 4 cm above the carina. Central venous catheter tip is at the cavoatrial junction. NG tube tip is in the stomach. Heart size and vascularity are normal. Minimal atelectasis at the left lung base laterally. Lungs are otherwise clear. IMPRESSION: Minimal left base atelectasis. Support apparatus appear in good position. Electronically Signed   By: Lorriane Shire M.D.   On: 11/29/2017 07:16   Dg Chest Port 1 View  Result Date: 11/28/2017 CLINICAL DATA:  Central line placement. EXAM: PORTABLE CHEST 1 VIEW COMPARISON:  Radiograph of same day. FINDINGS: Stable cardiomediastinal silhouette. No pneumothorax or pleural effusion is noted. Minimal right basilar subsegmental atelectasis is noted. Endotracheal and nasogastric tubes are unchanged in position. Interval placement of left subclavian catheter with distal tip in expected position of right atrium. Withdrawal by 2-3 cm is recommended. Bony thorax is unremarkable. IMPRESSION: Mild right basilar subsegmental atelectasis. Endotracheal and nasogastric tubes are unchanged in position. Interval placement of left subclavian catheter with distal tip in expected position of right atrium; withdrawal by 2-3 cm is recommended. Electronically Signed   By: Marijo Conception, M.D.   On: 11/28/2017 10:44   Portable Chest Xray  Result Date: 11/28/2017 CLINICAL DATA:  Check endotracheal tube placement EXAM: PORTABLE CHEST 1 VIEW  COMPARISON:  11/27/2017 FINDINGS: Cardiac shadow is again mildly enlarged. The nasogastric catheter has been withdrawn and readvanced with the tip now in the stomach. Proximal side port lies in the distal esophagus. This could be advanced several cm as necessary. The endotracheal tube is stable in satisfactory position. Mild right basilar atelectasis is again noted. No pneumothorax is seen. No bony abnormality is noted. IMPRESSION: Nasogastric catheter is better positioned although could be advanced several cm further into the stomach as necessary. Stable right basilar atelectasis. Electronically Signed   By: Inez Catalina M.D.   On: 11/28/2017 07:25   Dg Chest Port 1 View  Result Date: 11/27/2017 CLINICAL DATA:  Endotracheal tube placement EXAM: PORTABLE CHEST 1 VIEW COMPARISON:  None. FINDINGS: 1555 hours. Low lung volumes with asymmetric elevation right hemidiaphragm. Basilar atelectasis noted, right greater than left. Heart size mildly enlarged. Endotracheal tube tip 2.9 cm above the base the carina. The NG tube is looped upon itself in the distal esophagus with the tip of the NG tube in the mid esophagus. Telemetry leads overlie the chest. IMPRESSION: 1. Endotracheal tube tip 2.9 cm above the base  the carina. 2. NG tube tip is folded back on itself in the distal esophagus with the tip in the mid esophagus. Electronically Signed   By: Misty Stanley M.D.   On: 11/27/2017 16:21   Dg Abd Portable 1v  Result Date: 11/27/2017 CLINICAL DATA:  OG tube placement EXAM: PORTABLE ABDOMEN - 1 VIEW COMPARISON:  Chest x-ray 11/27/2017 FINDINGS: Cardiomegaly with partial consolidation medial left lung base. Esophageal tube tip overlies the proximal stomach/GE junction region, side-port over the distal esophagus. IMPRESSION: 1. Esophageal tube tip overlies GE junction region with side port over the distal esophagus; suggest further advancement for more optimal positioning 2. Cardiomegaly with partial consolidation at the  left base Electronically Signed   By: Donavan Foil M.D.   On: 11/27/2017 16:16   Dg Swallowing Func-speech Pathology  Result Date: 12/30/2017 Objective Swallowing Evaluation: Type of Study: MBS-Modified Barium Swallow Study  Patient Details Name: ILEY DEIGNAN MRN: [id number] Date of Birth: [birth year] Today's Date: 12/30/2017 Time: SLP Start Time (ACUTE ONLY): 1335 -SLP Stop Time (ACUTE ONLY): 1350 SLP Time Calculation (min) (ACUTE ONLY): 15 min Past Medical History: Past Medical History: Diagnosis Date . ADHD  . Anxiety  . Depression  . Hypertension  Past Surgical History: The histories are not reviewed yet. Please review them in the "History" navigator section and refresh this [place]. HPI: [name] with PMH significant for HTN, anxiety, ADHD, admitted 9/19 with large L hemorrhagic basal ganglia with 15 mm shift with early herniation s/p L frontotemporal crani 9/19 for evacuation of hematoma. ETT 9/19; 9/20 returned to OR for decompressive craniectomy ; 10/1 trach/PEG; transitioned to TC and off vent 10/5. CT 9/24: 1. Left MCA distribution infarction is stable in distribution. Mild increased edema of the infarct with increased herniation of the left frontal lobe via the left frontal craniectomy. 2. Stable 6 mm left-to-right midline shift and partial effacement of left lateral ventricle. 3. Stable small hematoma within the left basal ganglia. CXR 10/9: no acute disease process. Pt trach changed yesterday to Shiley 6 cuffless  Subjective: Pt awake, alert Assessment / Plan / Recommendation CHL IP CLINICAL IMPRESSIONS 12/30/2017 Clinical Impression Pt exhibited moderate oral and mild pharyngeal dysphagia during today's objective swallow study with PMV in place. Oral impairments were marked primarily by anterior loss, oral holding, and intermittent premature spillage of liquids to the valleculae and pyriform sinuses. Pharyngeal impairment was evidenced by transient delayed swallow initiation with barium frequently  reaching pyriform sinuses before swallow mechanisms activated. One instance of flash penetration into laryngeal vestibule observed with thin from straw, however airway protection remained intact for all other trials of thin, nectar, puree, and regular textures. Given pt's significant apraxia, reduced comprehension and current deconditioning recommend pt remain  NPO today/tonight with ST trials of thin/solids to determine endurance of greater volumes over extended period of time. Pt likely able to initiate diet of mechanical soft or regular and thin liquids following ST session.   SLP Visit Diagnosis Dysphagia, oropharyngeal phase (R13.12) Attention and concentration deficit following -- Frontal lobe and executive function deficit following -- Impact on safety and function Moderate aspiration risk   CHL IP TREATMENT RECOMMENDATION 12/30/2017 Treatment Recommendations Therapy as outlined in treatment plan below   Prognosis 12/30/2017 Prognosis for Safe Diet Advancement Good Barriers to Reach Goals Language deficits Barriers/Prognosis Comment -- CHL IP DIET RECOMMENDATION 12/30/2017 SLP Diet Recommendations NPO;Other (Comment) Liquid Administration via -- Medication Administration -- Compensations -- Postural Changes --   CHL IP OTHER RECOMMENDATIONS 12/30/2017 Recommended  Consults -- Oral Care Recommendations Oral care QID Other Recommendations --   CHL IP FOLLOW UP RECOMMENDATIONS 12/30/2017 Follow up Recommendations Skilled Nursing facility   [facility] IP FREQUENCY AND DURATION 12/30/2017 Speech Therapy Frequency (ACUTE ONLY) min 2x/week Treatment Duration 2 weeks      CHL IP ORAL PHASE 12/30/2017 Oral Phase Impaired Oral - Pudding Teaspoon -- Oral - Pudding Cup -- Oral - Honey Teaspoon -- Oral - Honey Cup -- Oral - Nectar Teaspoon -- Oral - Nectar Cup Reduced posterior propulsion;Premature spillage;Holding of bolus;Right anterior bolus loss Oral - Nectar Straw -- Oral - Thin Teaspoon -- Oral - Thin Cup Holding of  bolus;Premature spillage;Right anterior bolus loss Oral - Thin Straw Holding of bolus;Lingual/palatal residue;Premature spillage Oral - Puree WFL Oral - Mech Soft -- Oral - Regular WFL Oral - Multi-Consistency -- Oral - Pill -- Oral Phase - Comment --  CHL IP PHARYNGEAL PHASE 12/30/2017 Pharyngeal Phase Impaired Pharyngeal- Pudding Teaspoon -- Pharyngeal -- Pharyngeal- Pudding Cup -- Pharyngeal -- Pharyngeal- Honey Teaspoon -- Pharyngeal -- Pharyngeal- Honey Cup -- Pharyngeal -- Pharyngeal- Nectar Teaspoon -- Pharyngeal -- Pharyngeal- Nectar Cup Reduced laryngeal elevation;Pharyngeal residue - valleculae;Reduced anterior laryngeal mobility Pharyngeal -- Pharyngeal- Nectar Straw -- Pharyngeal -- Pharyngeal- Thin Teaspoon -- Pharyngeal -- Pharyngeal- Thin Cup Delayed swallow initiation-pyriform sinuses;Reduced laryngeal elevation Pharyngeal -- Pharyngeal- Thin Straw Penetration/Aspiration during swallow;Pharyngeal residue - valleculae;Reduced laryngeal elevation;Reduced anterior laryngeal mobility Pharyngeal Material enters airway, remains ABOVE vocal cords then ejected out Pharyngeal- Puree WFL Pharyngeal -- Pharyngeal- Mechanical Soft -- Pharyngeal -- Pharyngeal- Regular WFL Pharyngeal -- Pharyngeal- Multi-consistency -- Pharyngeal -- Pharyngeal- Pill -- Pharyngeal -- Pharyngeal Comment --  CHL IP CERVICAL ESOPHAGEAL PHASE 12/30/2017 Cervical Esophageal Phase WFL Pudding Teaspoon -- Pudding Cup -- Honey Teaspoon -- Honey Cup -- Nectar Teaspoon -- Nectar Cup -- Nectar Straw -- Thin Teaspoon -- Thin Cup -- Thin Straw -- Puree -- Mechanical Soft -- Regular -- Multi-consistency -- Pill -- Cervical Esophageal Comment -- [place] Siren 12/30/2017, 3:54 PM Orbie Pyo Litaker M.Ed Actor Pager [phone] Office [phone]              Vas [place] Lower Extremity Venous (dvt)  Result Date: 11/30/2017  Lower Venous Study Indications: Immobility.  Performing Technologist: Oliver Hum RVT   Examination Guidelines: A complete evaluation includes B-mode imaging, spectral Doppler, color Doppler, and power Doppler as needed of all accessible portions of each vessel. Bilateral testing is considered an integral part of a complete examination. Limited examinations for reoccurring indications may be performed as noted.  Right Venous Findings: +---------+---------------+---------+-----------+----------+-------+          CompressibilityPhasicitySpontaneityPropertiesSummary +---------+---------------+---------+-----------+----------+-------+ CFV      Full           Yes      Yes                          +---------+---------------+---------+-----------+----------+-------+ SFJ      Full                                                 +---------+---------------+---------+-----------+----------+-------+ FV Prox  Full                                                 +---------+---------------+---------+-----------+----------+-------+  FV Mid   Full                                                 +---------+---------------+---------+-----------+----------+-------+ FV DistalFull                                                 +---------+---------------+---------+-----------+----------+-------+ PFV      Full                                                 +---------+---------------+---------+-----------+----------+-------+ POP      Full           Yes      Yes                          +---------+---------------+---------+-----------+----------+-------+ PTV      Full                                                 +---------+---------------+---------+-----------+----------+-------+ PERO     Full                                                 +---------+---------------+---------+-----------+----------+-------+  Left Venous Findings: +---------+---------------+---------+-----------+----------+-------+           CompressibilityPhasicitySpontaneityPropertiesSummary +---------+---------------+---------+-----------+----------+-------+ CFV      Full           Yes      Yes                          +---------+---------------+---------+-----------+----------+-------+ SFJ      Full                                                 +---------+---------------+---------+-----------+----------+-------+ FV Prox  Full                                                 +---------+---------------+---------+-----------+----------+-------+ FV Mid   Full                                                 +---------+---------------+---------+-----------+----------+-------+ FV DistalFull                                                 +---------+---------------+---------+-----------+----------+-------+  PFV      Full                                                 +---------+---------------+---------+-----------+----------+-------+ POP      Full           Yes      Yes                          +---------+---------------+---------+-----------+----------+-------+ PTV      Full                                                 +---------+---------------+---------+-----------+----------+-------+ PERO     Full                                                 +---------+---------------+---------+-----------+----------+-------+    Final Interpretation: Right: There is no evidence of deep vein thrombosis in the lower extremity. No cystic structure found in the popliteal fossa. Left: There is no evidence of deep vein thrombosis in the lower extremity. No cystic structure found in the popliteal fossa.  *See table(s) above for measurements and observations. Electronically signed by Harold Barban MD on 11/30/2017 at 5:51:34 PM.    Final    [place] Ekg Site Rite  Result Date: 11/27/2017 If Site Rite image not attached, placement could not be confirmed due to current cardiac rhythm.      HISTORY OF  PRESENT ILLNESS CHRISMA HURLOCK is a 42 y.o. Female with PMH of HTN  On hypertensive medications, antidepressants, stimulants, and Xanax which are all prescribed by her primary physician.  Per her daughter's  father,  she was last seen normal at approximately 1320 on 11/26/2017.  Apparently when she arrived home patient went up and went to sleep.  Upon waking she was not acting correctly and was dropping things thus the 42 year old daughter called the father multiple times saying the mom was not acting correctly.  He went to check on her and thought possibly this could be that she was drunk and she was obtunded but moving her arms. On the morning of 11/27/2017 she remained obtunded.  He then immediately called 911.  Patient was brought to the emergency department as a code stroke. Per husband there is no use of cocaine or stimulants other than what is prescribed that he knows of. Patient was immediately taken to the CT scan where a large area of hemorrhagic left basal ganglia and left frontal lobe was noted-5.8 x 3.5 cm there was also a 15 mm shift from left to right with early herniation. Patient received mannitol and  Neurosurgery was immediately called for decompression. She was admitted to the neuro ICU. Premorbid modified Rankin scale (mRS): 0. NIH stroke scale was 27. ICH Score: 3    HOSPITAL COURSE Initial hematoma evacuation performed. Neuro worsening led to hemicraniectomy a few days later. Initial insult felt to be ischemic with large hemorrhagic transformation and continued bleeding. Prolonged hospitalization due to lack of family support and insurance for rehab.  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 15 mm of left-to-right midline shift.  MRI of the brain: Post craniotomy 8-9 mm of right-to-left midline shift with mild left uncal herniation similar to previous. Underlying evolving large acute ischemic left MCA territory infarct involving the majority of the left MCA territory.  Repeat Head CT post evacuation 9/20 - Left MCA territory infarct with evacuated hematoma. Midline shift measures 8 mm.   UDS - positive for amphetamines ( Pt takes Adderal at home)  2D Echocardiogram EF 60 to 65%  LE venous doppler negative for DVT  Hypercoagulable labs negative except high ESR and CRP  LDL64  HgbA1c5.8  On ASA 81 for stroke prevention  Therapy recommendations:SNF  Disposition:CIR d/t lack of SNF availability.   Cerebral edema d/t hemorrhage  CT head midline shift 773m with uncal herniation  Repeat CT head s/p hematoma evacuation - midline shift 8173m on 3% hypertonic saline for cerebral edema   Na goal between 150-155.    Na 153->155  S/p hemicrani 9/20  Repeat CT 9/21 - midline shift 73m44mOn keppra for seizure prevention  Seizure  Episode of stiffness, tongue biting on 12/03/17  Overnight EEG no seizure  Increased keppra from 500 to 1000m87md  No more seizure since  Seizure precautions  Acute respiratory insufficiency in the setting of massive IPH s/p trach  Intubated on admission  CCM helped to manage  Status post tracheostomy on 12/09/17  Has #4 shiley/cuffless,  which is capped.   PMV underway w/ SLP  Capping trach trials underway, if stable x 3 days, plan to decannulate  Hypertensive emergency  BP elevated on admission  On norvasc 10,  Labetalol 300 tid  Off minoxidil and HCTZ, lisinopril 20 bid (Stopped lisinopril 10/24 d/t increased creatinine)  BP remains 130s  BP goal normotensive  Anemia  Hb 11.8 on admission  Hemoglobin  10.6 with ongoing improvement  Received one unit PRBC during this admission    Iron study iron 36 and ferritin 66  Continue  iron pills   Dysphagia, secondary to stroke  PEG tube placed on 12/09/2017 for tube feeding  Now on D1 thin liquid diet and tolerating  TF only if pt eats < 50% meals - has only received 1 x in the past 1 week  PEG maintenance  Other Stroke Risk Factors  ETOH use  Fall risk  Has low bed  Pads beside bed  Soft Helmet for use when at risk for fall  Other Active Problems  ADHD - on Adderell at home  Anxiety, Depression  Hypokalemia, resolved   Creatinine 1.48 x 1 day only, then resolved. Unclear etiology. ? Lab error  DISCHARGE EXAM Blood pressure (!) 142/101, pulse 87, temperature 98.7 F (37.1 C), temperature source Oral, resp. rate 16, height 5' 1"  (1.549 m), weight 67.3 kg, SpO2 95 %. HEENT-status post left frontoparietal craniotomy healed with wound clean dry Cardiovascular- S1-S2, RRR Skin-warm and dry Trach plugged, no respiratory distress Abd soft, non-distended. Wound from bone flap insertion healed Neurological Exam:  Awake, alert, globally aphasic. Tracks examiner and tries to answer questions. Will raise  L arm to command, hold/release examiner's hand to command and follow other simple commands. Does not follow commands on the right, no spontaneous movement on the right.  Attempts to smile and speak. Eyes open, EOMI, PERRL, mild left ptosis. Not blinking to visual threat on the right, but able to track examiner. Right facial droop. Tongue protrusion midline. LUE 5/5 and able to hold against gravity. LLE 5/5. RUE and RLE withdraws to pain but cannot hold antigravity. coordination and gait not otherwise tested.  Discharge Diet  Dysphagia 1 thin liquids  DISCHARGE PLAN  Disposition:  Transfer to [facility] for ongoing PT, OT and ST  aspirin 81 mg daily for secondary stroke prevention.  Recommend ongoing risk  factor control by Primary Care Physician at time of discharge from inpatient rehabilitation.  Follow-up Pcp in 2 weeks following discharge from rehab.  Follow-up in [facility] in 4 weeks following discharge from rehab, office to schedule an appointment.   60 minutes were spent preparing discharge.  Burnetta Sabin, MSN, APRN, ANVP-BC, AGPCNP-BC Advanced Practice Stroke Nurse [place] for Schedule & Pager information 01/08/2018 8:39 AM    I have personally examined this patient, reviewed notes, independently viewed imaging studies, participated in medical decision making and plan of care.ROS completed by me personally and pertinent positives fully documented  I have made any additions or clarifications directly to the above note. Agree with note above.    Antony Contras, MD Medical Director [facility] Pager: [phone] 01/19/2018 6:23 PM

## 2018-01-08 NOTE — Progress Notes (Signed)
Tammie Blake, MD  Physician  Physical Medicine and Rehabilitation  PMR Pre-admission  Signed  Date of Service:  01/08/2018 12:12 PM       Related encounter: ED to Hosp-Admission (Discharged) from 11/27/2017 in [facility]      Signed         Show:Clear all _0 Manual_1 Template_2 Copied  Added by: _3 Cristina Gong, RN_4 Tammie Blake, MD  _5 Hover for details [employer] PMR Admission Coordinator Pre-Admission Assessment  Patient: Tammie Miles is an 42 y.o., female MRN: [id number] DOB: [birth year] Height: _6  (154.9 cm) Weight: 67.3 kg  Insurance Information PRIMARY: uninsured  Medicaid Application Date:       Case Manager:  Disability Application Date:       Case Worker:  Letitia Libra financial counselor at [phone] disability and Medicaid applications in process MAD. Case worker is Howell Rucks [phone]  Emergency Contact Information         Contact Information    Name Relation Home Work [place], [place] Son [phone]     Sander Nephew Other [phone]        Current Medical History  Patient Admitting Diagnosis: ICH  History of Present Illness:Tammie Miles is a 42 year old right-handed female history of hypertension, anxiety/depression and alcohol use. Presented 11/27/2017 with altered mental status and became obtunded. Urine drug screen positive amphetamines(patient does take Adderall at home), alcohol negative, lactic acid 4.3. Cranial CT scan showed a large area of hemorrhage in the left basal ganglia and left frontal lobe, 5.8 x 3.5 cm with a 15 mm left to right midline shift. CT angiogram of the head positive for left MCA M1 large vessel occlusion and also severe stenosis at the left ICA terminus. Negative for intracranial aneurysm. Neurosurgery Dr. Kathyrn Sheriff consulted and initially underwent left frontotemporal craniotomy for evacuation of hematoma 9/19/2019skull flap  placed inabdomen 11/28/2017 due to elevated ICP. Postoperative changes from craniotomy 8 to 9 mm right to left midline shift. Echocardiogram with ejection fraction 65%. Lower extremity Dopplers negative. An EEG completed 12/04/2017 showed no seizure and maintained on Keppra. 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 Mild increase edema of infarct with increased herniation of the left frontal lobe via the left frontal craniectomy. Stable 6 mm left to right midline shift and partial effacement of left lateral ventricle. Patient was later placed on aspirin for CVA prophylaxis. Hospital course with prolonged intubation and underwent tracheostomy tube placed by Dr. Nelda Marseille 12/09/2017 and currently with a #4 cuff less trach and no current plan to de cannulate at this time. Speech therapy is working with [facility]. Capping trials underway per pulmonary services with minimal secretions await plan for possible decannulation 01/09/2018. PEG tube placement 12/08/2017 per Dr. Georganna Skeans. Dysphasia #1 thin liquid diet. Subcutaneous heparin initiated for DVT prophylaxis 12/15/2017. Ongoing bouts of restlessness.   Patient's medical record from [place] has been reviewed by the rehabilitation admission coordinator and physician Dr. Naaman Plummer on 01/06/2018.  NIH Stroke scale: _7 ([id number]) Glascow Coma Scale: )    Past Medical History      Past Medical History:  Diagnosis Date  . ADHD   . Anxiety   . Depression   . Hypertension     Family History   family history includes Hypertension in her father and mother.  Prior Rehab/Hospitalizations Has the patient had major surgery during 100 days prior to admission? No  Current Medications  Current Facility-Administered Medications:  .  acetaminophen (TYLENOL) tablet 650 mg, 650 mg, Oral, Q4H PRN, 650 mg at 01/05/18 0434 **OR** [DISCONTINUED] acetaminophen (TYLENOL) solution 650 mg,  650 mg, Per Tube, Q4H PRN, 650 mg at 12/15/17 0412 **OR** [DISCONTINUED] acetaminophen (TYLENOL) suppository 650 mg, 650 mg, Rectal, Q4H PRN, Erline Levine, MD .  amLODipine (NORVASC) tablet 10 mg, 10 mg, Oral, Daily, Ollis, Brandi L, NP, 10 mg at 01/07/18 1000 .  aspirin chewable tablet 81 mg, 81 mg, Per Tube, Daily, Rosalin Hawking, MD, 81 mg at 01/08/18 1046 .  chlorhexidine gluconate (MEDLINE KIT) (PERIDEX) 0.12 % solution 15 mL, 15 mL, Mouth Rinse, BID, Erline Levine, MD, 15 mL at 01/08/18 0800 .  docusate (COLACE) 50 MG/5ML liquid 100 mg, 100 mg, Per Tube, BID, Jennelle Human B, NP, 100 mg at 01/08/18 1045 .  famotidine (PEPCID) 40 MG/5ML suspension 20 mg, 20 mg, Per Tube, BID, Jennelle Human B, NP, 20 mg at 01/08/18 1047 .  feeding supplement (ENSURE ENLIVE) (ENSURE ENLIVE) liquid 237 mL, 237 mL, Oral, BID BM, Rosalin Hawking, MD, 237 mL at 01/08/18 1046 .  ferrous sulfate tablet 325 mg, 325 mg, Oral, BID, Rosalin Hawking, MD, 325 mg at 01/08/18 1046 .  folic acid (FOLVITE) tablet 1 mg, 1 mg, Per Tube, Daily, Masters, Jake Church, RPH, 1 mg at 01/08/18 1046 .  free water 150 mL, 150 mL, Per Tube, Q6H, Rosalin Hawking, MD, 150 mL at 01/08/18 0514 .  heparin injection 5,000 Units, 5,000 Units, Subcutaneous, Q8H, Rosalin Hawking, MD, 5,000 Units at 01/08/18 0510 .  labetalol (NORMODYNE) tablet 300 mg, 300 mg, Oral, TID, Burnetta Sabin L, NP, 300 mg at 01/08/18 0513 .  labetalol (NORMODYNE,TRANDATE) injection 10-40 mg, 10-40 mg, Intravenous, Q10 min PRN, Garvin Fila, MD, 40 mg at 12/09/17 1543 .  levETIRAcetam (KEPPRA) 100 MG/ML solution 1,000 mg, 1,000 mg, Per Tube, Q12H, Karren Cobble, RPH, 1,000 mg at 01/08/18 0006 .  LORazepam (ATIVAN) injection 2 mg, 2 mg, Intravenous, Once PRN, Rosalin Hawking, MD .  MEDLINE mouth rinse, 15 mL, Mouth Rinse, 10 times per day, Erline Levine, MD, 15 mL at 01/08/18 1047 .  multivitamin liquid 15 mL, 15 mL, Per Tube, Daily, Erline Levine, MD, 15 mL at 01/08/18 1047 .  psyllium  (HYDROCIL/METAMUCIL) packet 1 packet, 1 packet, Oral, BID, Kipp Brood, MD, 1 packet at 01/08/18 1046 .  senna-docusate (Senokot-S) tablet 1 tablet, 1 tablet, Oral, QHS PRN, Biby, Sharon L, NP .  thiamine (VITAMIN B-1) tablet 100 mg, 100 mg, Per Tube, Daily, Masters, Jake Church, RPH, 100 mg at 01/08/18 1046  Patients Current Diet:      Diet Order                  DIET - DYS 1 Room service appropriate? Yes; Fluid consistency: Thin  Diet effective now               Precautions / Restrictions Precautions Precautions: Fall Precautions/Special Needs: Swallowing Precaution Comments: trach collar, PEG, no bone flap L skull Other Brace/Splint: Bilateral PRAFO Boots In bed only please apply ace wrap on top to secure as they are size too big to fit appropriately,  HELMET TO BE WORN OOB AT ALL TIMES, CAN BE OFF IN BED.   Restrictions Weight Bearing Restrictions: No Other Position/Activity Restrictions: PMV on during therapy patient has a cuffless trach.     Has the patient had 2 or more falls or a fall with injury  in the past year?No  Prior Activity Level Community (5-7x/wk): Patient  independent and caring for 44 year old daughter pta  Prior Functional Level Do you want Prior Function Level of Independence: Independent Comments: independent; not working per report from other? Self Care: Did the patient need help bathing, dressing, using the toilet or eating?  Independent  Indoor Mobility: Did the patient need assistance with walking from room to room (with or without device)? Independent  Stairs: Did the patient need assistance with internal or external stairs (with or without device)? Independent  Functional Cognition: Did the patient need help planning regular tasks such as shopping or remembering to take medications? Independent  Home Assistive Devices / Equipment Home Assistive Devices/Equipment: None  Prior Device Use: Indicate devices/aids used by the  patient prior to current illness, exacerbation or injury? None of the above  Prior Functional Level Comments: independent; not working per report   Prior Functional Level Current Functional Level  Bed Mobility Independent Min assist with increased time for management of right side, but able to initiate ROM of RLE towards EOB without assist  Transfers Independent Min assist with cues for hand placement and safety, requires assist to raise RUE to splint on RW and place Velcro strap  Mobility - Walk/Wheelchair Independent  Mod assist 45 feet with RW; scissoring gait. Hemiparetic gait but able to progress steps on right side without assist  Mobility - Ambulation/Gait Independent Mod assist, +2 safety/equipment(3rd person for chair follow)  Upper Body Dressing Independent Total assistance, Bed level  Lower Body Dressing Independent Total assistance, Sit to/from stand  Grooming Independent Minimal assistance, Standing, Oral care(+2 safety )  Eating/Drinking Independent PEG; Dysphagia 1 puree with thin liquids; total assist  Toilet Transfer Independent Minimal assistance, +2 for safety/equipment, Ambulation, RW(simulated to recliner)  Bladder Continence Independent  Incontinent  Bowel Management  Independent  Incontinent  Stair Climbing Independent  Not attempted  Communication Independent Expressive difficulties, Receptive difficulties(non-verbal, aphasic)  Memory Independent  total assist  Cooking/Meal Prep  independent      Housework  independent   Money Management  independent   Driving   Independent     Special needs/care consideration BiPAP/CPAP n/a CPM n/a Continuous Drip IV n/a Dialysis n/a Life Vest n/a Oxygen n/a Special Bed low bed Trach Size # 4 cuff less capped 01/07/18 with plans to decannulation by PCCM on 01/09/18 Wound Vac n/a Skin surgical incision ; Peg to abd placed 10/1; stage II partial thickness to coccyx 0.75 cm X 0.5 cm; ecchymosis to bilateral  arms and legs; bone flay to abdominal surgical area Bowel mgmt: incontinent LBM 10/30 Bladder mgmt: incontinent Diabetic mgmt n/a  Previous Home Environment Living Arrangements: Children(lived in apartment with 52 year old daughter)  Lives With: Daughter(15 year old daughter) Available Help at Discharge: Other (Comment)(plan is for SNF after 3 week ELOS of CIR) Type of Home: Apartment Home Layout: One level Home Care Services: No Additional Comments: pt has 64 year old daughter bu boyfriend and 53 year old son from past realtionship  Discharge Living Setting Plans for Discharge Living Setting: (SNF) Type of Home at Discharge: [place] [facility] Name at Discharge: Georga Kaufmann with Care Management to asisst with SNF placement; Possible [facility] has interest once decannulated Does the patient have any problems obtaining your medications?: Yes (Describe)   Georga Kaufmann, Care Management with plans to assist with SNF placement with LOD in 3 weeks from admission to CIR. Son will have to sign pt in.  I spoke with son, Baldwin Jamaica, by phone and he does want to continue to be decision maker for his Mom, but unable to be her caregiver. I had asked to clarify if guardianship would need to be pursued in the future.   SNF placement has been difficult to date for patient has trach and when unrestrained, she will dislodge trach. plans to decannulation.  Ronta's Dad, Sander Nephew, is able to assist with directing his son , Baldwin Jamaica. Rodney's phone number is [phone]. Patient and Baldwin Jamaica had been somewhat estranged pta. Patient also estranged from her family per [place].  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, Manufacturing systems engineer Information: 60 year old son, Elmon Else Anticipated Caregiver: SNF facility longterm Anticipated Caregiver's Contact Information: Ronta cell [phone] also by Face book messenger Ronta Ability/Limitations of Caregiver: Baldwin Jamaica is 42 years old  and was estranged pta; he has no plans to be her caregiver but does continue to want to be her decision maker Caregiver Availability: Other (Comment) Discharge Plan Discussed with Primary Caregiver: Yes Is Caregiver In Agreement with Plan?: Yes Does Caregiver/Family have Issues with Lodging/Transportation while Pt is in Rehab?: No  Goals/Additional Needs Patient/Family Goal for Rehab: Min to mod PT, Min to Mod OT, Min to Mod SLP Expected length of stay: ELOS 3 weeks Dietary Needs: PEG; Dysphagia 1 ( puree) with thin liquids. Meds crushed with puree; full supervision; Equipment Needs: PEG; capped trach since 01/07/18. Plans for PCCM to decannualte 01/09/2018 Special Service Needs: fall bundle Pt/Family Agrees to Admission and willing to participate: Yes Program Orientation Provided & Reviewed with Pt/Caregiver Including Roles  & Responsibilities: Yes Additional Information Needs: Patietn lacks capacity to agree; son , Elmon Else in agreement via phone  Barriers to Discharge: Decreased caregiver support, Home environment access/layout, [place]line [place], Lack of/limited family support, Insurance for SNF coverage, Nutrition means, Incontinence  Barriers to Discharge Comments: Decreaed burden of care via decannualtion during AIR admit; decrease caregivers needed for SNF placement  Patient Condition: Patient will benefit from ongoing PT, OT, and SLP offered from the coordinated team approach during an Inpatient Acute Rehabilitation admission. She will receive 3 hours of therapy p[er day as well as Rehabilitation physician, Nursing and Care Management interventions. She is currently mod to max assist with all mobility and ADLs. Patient can actively participate in an intensive therapy program of at least 3 hrs of therapy 5 days a week and make measurable gains while admitted. Anticipated discharge setting is Skilled Nursing placement. Family has agreed to participate in the Acute Inpatient Rehabilitation  program. We will admit today 01/08/2018.  Preadmission Screen Completed By:  Cleatrice Burke, 01/08/2018 12:12 PM ______________________________________________________________________   Discussed status with Dr. Letta Pate  on  01/08/2018  at  1314 and received telephone approval for admission today.  Admission Coordinator:  Cleatrice Burke, time  1314 Date  10/3/12019   Assessment/Plan: Diagnosis:Left ICH, large requiring decompression via craniectomy 1. Does the need for close, 24 hr/day  Medical supervision in concert with the patient's rehab needs make it unreasonable for this patient to be served in a less intensive setting? Yes 2. Co-Morbidities requiring supervision/potential complications: Dysphagia, s/p PEG placement,  3. Due to bladder management, bowel management, safety, skin/wound care, disease management, medication administration, pain management and patient education, does the patient require 24 hr/day rehab nursing? Yes 4. Does the patient require coordinated care of a physician, rehab nurse, PT (1-2 hrs/day, 5 days/week), OT (1-2 hrs/day, 5 days/week) and SLP (.5-1 hrs/day, 5 days/week) to address physical and  functional deficits in the context of the above medical diagnosis(es)? Yes Addressing deficits in the following areas: balance, endurance, locomotion, strength, transferring, bowel/bladder control, bathing, dressing, feeding, grooming, toileting, cognition, speech, swallowing and psychosocial support 5. Can the patient actively participate in an intensive therapy program of at least 3 hrs of therapy 5 days a week? Yes 6. The potential for patient to make measurable gains while on inpatient rehab is good 7. Anticipated functional outcomes upon discharge from inpatients are: supervision PT, supervision OT, supervision SLP 8. Estimated rehab length of stay to reach the above functional goals is: 18-21d 9. Anticipated D/C setting: Home 10. Anticipated post  D/C treatments: [facility] therapy 11. Overall Rehab/Functional Prognosis: good    RECOMMENDATIONS: This patient's condition is appropriate for continued rehabilitative care in the following setting: CIR Patient has agreed to participate in recommended program. Yes Note that insurance prior authorization may be required for reimbursement for recommended care.  Comment:  Cleatrice Burke 01/08/2018        Revision History

## 2018-01-08 NOTE — Progress Notes (Signed)
Physical Therapy Treatment Patient Details Name: Tammie Miles MRN: [id number] DOB: Apr 23, 1975 Today's Date: 01/08/2018    History of Present Illness Patient is a 42 y/o female who was admitted for AMS/unresponsiveness. 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 Returned to OR 9/20 for decompressive crani with left skull harvested in abdomen. 9/25 developed seizures. Intubated 9/19-10/7. s/p trach and peg 10/1. PMH includes HTN, depression, anxiety.  Trach capped on 10/29.    PT Comments    Patient's tolerance to treatment today was good.  Pt continues to demonstrate good initiation of purposeful movement during gait.  Pt continues to require repeated verbal cueing for proper technique of all exercises.  She continues to follow verbal commands inconsistently requiring increased time for processing.  Pt would continue to benefit from acute care PT in order to increase strength, endurance, balance, and activity tolerance prior to recommended CIR destination.     Follow Up Recommendations  CIR;Supervision/Assistance - 24 hour     Equipment Recommendations  None recommended by PT    Recommendations for Other Services       Precautions / Restrictions Precautions Precautions: Fall Precaution Comments: trach collar, PEG, no bone flap L skull Required Braces or Orthoses: Other Brace/Splint Other Brace/Splint: Bilateral PRAFO Boots In bed only please apply ace wrap on top to secure as they are size too big to fit appropriately,  HELMET TO BE WORN OOB AT ALL TIMES, CAN BE OFF IN BED.   Restrictions Weight Bearing Restrictions: No Other Position/Activity Restrictions: PMV on during therapy patient has a cuffless trach.      Mobility  Bed Mobility Overal bed mobility: Needs Assistance Bed Mobility: Supine to Sit     Supine to sit: Min  assist;Mod assist     General bed mobility comments: continues to require increased time for management of right side.  continues to initiate ROM of R LE towards EOB.  min VC and min assist for management of R UE when scooting to EOB.  Transfers Overall transfer level: Needs assistance Equipment used: Rolling walker (2 wheeled) Transfers: Sit to/from Stand Sit to Stand: Min assist;+2 safety/equipment         General transfer comment: pt continues to require min assist to boost into standing after R hand strapped into walker grip.  Pt initiated sit to stand before being instructed by therapist.  Ambulation/Gait Ambulation/Gait assistance: Mod assist;+2 safety/equipment(3rd person for chair follow) Gait Distance (Feet): 40 Feet Assistive device: Rolling walker (2 wheeled) Gait Pattern/deviations: Step-through pattern;Decreased stride length;Scissoring;Decreased step length - right;Narrow base of support;Drifts right/left Gait velocity: decreased   General Gait Details: Pt continues to present with hemiparetic gait but able to progress steps on R side without assistance.  Pt continues to demonstrate good initiation of gait on the R.  Pt required min VC to correct scissoring gait throughout session.  pt continues to need VC to increase BOS and assistance to maintain alignment of RW.    Stairs             Wheelchair Mobility    Modified Rankin (Stroke Patients Only) Modified Rankin (Stroke Patients Only) Pre-Morbid Rankin Score: No symptoms Modified Rankin: Moderately severe disability     Balance Overall balance assessment: Needs assistance Sitting-balance support: Feet supported;Single extremity supported Sitting balance-Leahy Scale: Fair Sitting balance - Comments: min guard for safety in static sitting  Postural control: Right lateral lean Standing balance support: During functional activity;Single extremity supported Standing balance-Leahy Scale: Poor Standing  balance comment: reliant on UE support from splint/external assist from therapist, Remains to lose balance to the R.                              Cognition Arousal/Alertness: Awake/alert Behavior During Therapy: Flat affect Overall Cognitive Status: Within Functional Limits for tasks assessed Area of Impairment: Following commands;Safety/judgement;Problem solving                       Following Commands: Follows one step commands with increased time;Follows one step commands inconsistently Safety/Judgement: Decreased awareness of safety   Problem Solving: Slow processing;Decreased initiation;Requires verbal cues;Requires tactile cues;Difficulty sequencing General Comments: pt continues to require increased time to follow commands.  VC provided to help patient maintain awareness of R UE.      Exercises General Exercises - Lower Extremity Heel Slides: AROM;Left;10 reps;Supine(PROM, right, 10 reps, supine)    General Comments        Pertinent Vitals/Pain Pain Assessment: Faces Faces Pain Scale: No hurt    Home Living   Living Arrangements: Children(lived in apartment with 79 year old daughter) Available Help at Discharge: Other (Comment)(plan is for SNF after 3 week ELOS of CIR) Type of Home: Apartment     Home Layout: One level   Additional Comments: pt has 68 year old daughter bu boyfriend and 47 year old son from past realtionship    Prior Function        Comments: independent; not working per report   PT Goals (current goals can now be found in the care plan section) Acute Rehab PT Goals Patient Stated Goal: unable to participate with goals PT Goal Formulation: Patient unable to participate in goal setting Time For Goal Achievement: 01/13/18 Potential to Achieve Goals: Fair Additional Goals Additional Goal #1: Pt will show purposeful movement to v/t cuing. Progress towards PT goals: Progressing toward goals    Frequency    Min  3X/week      PT Plan Discharge plan needs to be updated    Co-evaluation              AM-PAC PT "6 Clicks" Daily Activity  Outcome Measure  Difficulty turning over in bed (including adjusting bedclothes, sheets and blankets)?: Unable Difficulty moving from lying on back to sitting on the side of the bed? : A Little Difficulty sitting down on and standing up from a chair with arms (e.g., wheelchair, bedside commode, etc,.)?: A Little Help needed moving to and from a bed to chair (including a wheelchair)?: A Little Help needed walking in hospital room?: A Lot Help needed climbing 3-5 steps with a railing? : A Lot 6 Click Score: 14    End of Session Equipment Utilized During Treatment: Gait belt Activity Tolerance: Patient tolerated treatment well Patient left: in chair;with chair alarm set;with call bell/phone within reach Nurse Communication: Mobility status PT Visit Diagnosis: Other abnormalities of gait and mobility (R26.89);Muscle weakness (generalized) (M62.81);Hemiplegia and hemiparesis Hemiplegia - Right/Left: Right Hemiplegia - caused by: Nontraumatic intracerebral hemorrhage     Time: 1010-1046 PT Time Calculation (min) (ACUTE ONLY): 36 min  Charges:  $Gait Training: 8-22 mins $Therapeutic Activity: 8-22 mins                     [address], SPTA   [address]  01/08/2018, 1:28 PM

## 2018-01-08 NOTE — Progress Notes (Signed)
Pt arrived to unit in low bed, cont pulse ox on and functioning, suction set up at bedside as well as trach equipment. Pt is alert, bed in low in position with seizure pads on, bed alarm on and call bell in reach. Will continue to monitor.

## 2018-01-08 NOTE — Progress Notes (Signed)
Inpatient Rehabilitation Admissions Coordinator  We will admit pt to inpt rehab today. I will contact her son, Guy Begin, of  Location. RN CM and SW aware.   Ottie Glazier, RN, MSN Rehab Admissions Coordinator [phone] 01/08/2018 1:29 PM

## 2018-01-08 NOTE — PMR Pre-admission (Signed)
[facility] PMR Admission Coordinator Pre-Admission Assessment  Patient: Tammie Miles is an 42 y.o., female MRN: [id number] DOB: [birth year] Height: 5' 1" (154.9 cm) Weight: 67.3 kg  Insurance Information PRIMARY: uninsured  Medicaid Application Date:       Case Manager:  Disability Application Date:       Case Worker:  Letitia Libra financial counselor at [phone] disability and Medicaid applications in process [facility]. Case worker is Howell Rucks [phone]  Emergency Contact Information Contact Information    Name Relation Home Work [place], [place] Son [phone]     Sander Nephew Other [phone]        Current Medical History  Patient Admitting Diagnosis: ICH  History of Present Illness: Tammie Miles is a 42 year old right-handed female history of hypertension, anxiety/depression and alcohol use.   Presented 11/27/2017 with altered mental status and became obtunded.  Urine drug screen positive amphetamines(patient does take Adderall at home), alcohol negative, lactic acid 4.3.  Cranial CT scan showed a large area of hemorrhage in the left basal ganglia and left frontal lobe, 5.8 x 3.5 cm with a 15 mm left to right midline shift.  CT angiogram of the head positive for left MCA M1 large vessel occlusion and also severe stenosis at the left ICA terminus.  Negative for intracranial aneurysm.  Neurosurgery Dr. Kathyrn Sheriff consulted and initially underwent left frontotemporal craniotomy for evacuation of hematoma 9/19/2019skull flap placed in abdomen 11/28/2017 due to elevated ICP.    Postoperative changes from craniotomy 8 to 9 mm right to left midline shift.  Echocardiogram with ejection fraction 65%.  Lower extremity Dopplers negative.  An EEG completed 12/04/2017 showed no seizure and maintained on Keppra.  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  Mild increase edema of infarct with increased herniation of the left frontal lobe via the  left frontal craniectomy.  Stable 6 mm left to right midline shift and partial effacement of left lateral ventricle.  Patient was later placed on aspirin for CVA prophylaxis.  Hospital course with prolonged intubation and underwent tracheostomy tube placed by Dr. Nelda Marseille 12/09/2017 and currently with a #4 cuff less trach and no current plan to de cannulate at this time.  Speech therapy is working with [facility].  Capping trials underway per pulmonary services with minimal secretions await plan for possible decannulation 01/09/2018.  PEG tube placement 12/08/2017 per Dr. Georganna Skeans.  Dysphasia #1 thin liquid diet.  Subcutaneous heparin initiated for DVT prophylaxis 12/15/2017.  Ongoing bouts of restlessness.   Patient's medical record from [facility] has been reviewed by the rehabilitation admission coordinator and physician Dr. Naaman Plummer on 01/06/2018.  NIH Stroke scale: _0 ([id number]) Glascow Coma Scale: )    Past Medical History  Past Medical History:  Diagnosis Date  . ADHD   . Anxiety   . Depression   . Hypertension     Family History   family history includes Hypertension in her father and mother.  Prior Rehab/Hospitalizations Has the patient had major surgery during 100 days prior to admission? No    Current Medications  Current Facility-Administered Medications:  .  acetaminophen (TYLENOL) tablet 650 mg, 650 mg, Oral, Q4H PRN, 650 mg at 01/05/18 0434 **OR** [DISCONTINUED] acetaminophen (TYLENOL) solution 650 mg, 650 mg, Per Tube, Q4H PRN, 650 mg at 12/15/17 0412 **OR** [DISCONTINUED] acetaminophen (TYLENOL) suppository 650 mg, 650 mg, Rectal, Q4H PRN, Erline Levine, MD .  amLODipine (NORVASC) tablet 10 mg, 10 mg, Oral, Daily, Ollis, Brandi L,  NP, 10 mg at 01/07/18 1000 .  aspirin chewable tablet 81 mg, 81 mg, Per Tube, Daily, Rosalin Hawking, MD, 81 mg at 01/08/18 1046 .  chlorhexidine gluconate (MEDLINE KIT) (PERIDEX) 0.12 % solution 15 mL, 15 mL, Mouth Rinse, BID, Erline Levine,  MD, 15 mL at 01/08/18 0800 .  docusate (COLACE) 50 MG/5ML liquid 100 mg, 100 mg, Per Tube, BID, Jennelle Human B, NP, 100 mg at 01/08/18 1045 .  famotidine (PEPCID) 40 MG/5ML suspension 20 mg, 20 mg, Per Tube, BID, Jennelle Human B, NP, 20 mg at 01/08/18 1047 .  feeding supplement (ENSURE ENLIVE) (ENSURE ENLIVE) liquid 237 mL, 237 mL, Oral, BID BM, Rosalin Hawking, MD, 237 mL at 01/08/18 1046 .  ferrous sulfate tablet 325 mg, 325 mg, Oral, BID, Rosalin Hawking, MD, 325 mg at 01/08/18 1046 .  folic acid (FOLVITE) tablet 1 mg, 1 mg, Per Tube, Daily, Masters, Jake Church, RPH, 1 mg at 01/08/18 1046 .  free water 150 mL, 150 mL, Per Tube, Q6H, Rosalin Hawking, MD, 150 mL at 01/08/18 0514 .  heparin injection 5,000 Units, 5,000 Units, Subcutaneous, Q8H, Rosalin Hawking, MD, 5,000 Units at 01/08/18 0510 .  labetalol (NORMODYNE) tablet 300 mg, 300 mg, Oral, TID, Burnetta Sabin L, NP, 300 mg at 01/08/18 0513 .  labetalol (NORMODYNE,TRANDATE) injection 10-40 mg, 10-40 mg, Intravenous, Q10 min PRN, Garvin Fila, MD, 40 mg at 12/09/17 1543 .  levETIRAcetam (KEPPRA) 100 MG/ML solution 1,000 mg, 1,000 mg, Per Tube, Q12H, Karren Cobble, RPH, 1,000 mg at 01/08/18 0006 .  LORazepam (ATIVAN) injection 2 mg, 2 mg, Intravenous, Once PRN, Rosalin Hawking, MD .  MEDLINE mouth rinse, 15 mL, Mouth Rinse, 10 times per day, Erline Levine, MD, 15 mL at 01/08/18 1047 .  multivitamin liquid 15 mL, 15 mL, Per Tube, Daily, Erline Levine, MD, 15 mL at 01/08/18 1047 .  psyllium (HYDROCIL/METAMUCIL) packet 1 packet, 1 packet, Oral, BID, Kipp Brood, MD, 1 packet at 01/08/18 1046 .  senna-docusate (Senokot-S) tablet 1 tablet, 1 tablet, Oral, QHS PRN, Biby, Sharon L, NP .  thiamine (VITAMIN B-1) tablet 100 mg, 100 mg, Per Tube, Daily, Masters, Jake Church, RPH, 100 mg at 01/08/18 1046  Patients Current Diet:   Diet Order            DIET - DYS 1 Room service appropriate? Yes; Fluid consistency: Thin  Diet effective now               Precautions / Restrictions Precautions Precautions: Fall Precautions/Special Needs: Swallowing Precaution Comments: trach collar, PEG, no bone flap L skull Other Brace/Splint: Bilateral PRAFO Boots In bed only please apply ace wrap on top to secure as they are size too big to fit appropriately,  HELMET TO BE WORN OOB AT ALL TIMES, CAN BE OFF IN BED.   Restrictions Weight Bearing Restrictions: No Other Position/Activity Restrictions: PMV on during therapy patient has a cuffless trach.     Has the patient had 2 or more falls or a fall with injury in the past year?No  Prior Activity Level Community (5-7x/wk): Patient  independent and caring for 33 year old daughter pta  Prior Functional Level Do you want Prior Function Level of Independence: Independent Comments: independent; not working per report from other? Self Care: Did the patient need help bathing, dressing, using the toilet or eating?  Independent  Indoor Mobility: Did the patient need assistance with walking from room to room (with or without device)? Independent  Stairs: Did the  patient need assistance with internal or external stairs (with or without device)? Independent  Functional Cognition: Did the patient need help planning regular tasks such as shopping or remembering to take medications? Independent  Home Assistive Devices / Equipment Home Assistive Devices/Equipment: None  Prior Device Use: Indicate devices/aids used by the patient prior to current illness, exacerbation or injury? None of the above  Prior Functional Level Comments: independent; not working per report   Prior Functional Level Current Functional Level  Bed Mobility Independent Min assist with increased time for management of right side, but able to initiate ROM of RLE towards EOB without assist  Transfers Independent Min assist with cues for hand placement and safety, requires assist to raise RUE to splint on RW and place Velcro strap   Mobility - Walk/Wheelchair Independent  Mod assist 45 feet with RW; scissoring gait. Hemiparetic gait but able to progress steps on right side without assist  Mobility - Ambulation/Gait Independent Mod assist, +2 safety/equipment(3rd person for chair follow)  Upper Body Dressing Independent Total assistance, Bed level  Lower Body Dressing Independent Total assistance, Sit to/from stand  Grooming Independent Minimal assistance, Standing, Oral care(+2 safety )  Eating/Drinking Independent PEG; Dysphagia 1 puree with thin liquids; total assist  Toilet Transfer Independent Minimal assistance, +2 for safety/equipment, Ambulation, RW(simulated to recliner)  Bladder Continence Independent  Incontinent  Bowel Management  Independent  Incontinent  Stair Climbing Independent  Not attempted  Communication Independent Expressive difficulties, Receptive difficulties(non-verbal, aphasic)  Memory Independent  total assist  Cooking/Meal Prep  independent      Housework  independent   Money Management  independent   Driving   Independent     Special needs/care consideration BiPAP/CPAP n/a CPM n/a Continuous Drip IV n/a Dialysis n/a Life Vest n/a Oxygen n/a Special Bed low bed Trach Size # 4 cuff less capped 01/07/18 with plans to decannulation by PCCM on 01/09/18 Wound Vac n/a Skin surgical incision ; Peg to abd placed 10/1; stage II partial thickness to coccyx 0.75 cm X 0.5 cm; ecchymosis to bilateral arms and legs; bone flay to abdominal surgical area Bowel mgmt: incontinent LBM 10/30 Bladder mgmt: incontinent Diabetic mgmt n/a  Previous Home Environment Living Arrangements: Children(lived in apartment with 41 year old daughter)  Lives With: Daughter(7 year old daughter) Available Help at Discharge: Other (Comment)(plan is for SNF after 3 week ELOS of CIR) Type of Home: Apartment Home Layout: One level Home Care Services: No Additional Comments: pt has 70 year old daughter bu boyfriend  and 59 year old son from past realtionship  Discharge Living Setting Plans for Discharge Living Setting: (SNF) Type of Home at Discharge: [place] Name at Discharge: Georga Kaufmann with Care Management to asisst with SNF placement; Possible [facility] has interest once decannulated Does the patient have any problems obtaining your medications?: Yes (Describe)   Georga Kaufmann, Care Management with plans to assist with SNF placement with LOD in 3 weeks from admission to CIR. Son will have to sign pt in. I spoke with son, Baldwin Jamaica, by phone and he does want to continue to be decision maker for his Mom, but unable to be her caregiver. I had asked to clarify if guardianship would need to be pursued in the future.   SNF placement has been difficult to date for patient has trach and when unrestrained, she will dislodge trach. plans to decannulation.  Ronta's Dad, Sander Nephew, is able to assist with directing his son , Baldwin Jamaica. Rodney's phone  number is [phone]. Patient and Baldwin Jamaica had been somewhat estranged pta. Patient also estranged from her family per [place].  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, Manufacturing systems engineer Information: 32 year old son, Elmon Else Anticipated Caregiver: SNF facility longterm Anticipated Caregiver's Contact Information: Ronta cell [phone] also by Face book messenger Ronta Ability/Limitations of Caregiver: Baldwin Jamaica is 42 years old and was estranged pta; he has no plans to be her caregiver but does continue to want to be her decision maker Caregiver Availability: Other (Comment) Discharge Plan Discussed with Primary Caregiver: Yes Is Caregiver In Agreement with Plan?: Yes Does Caregiver/Family have Issues with Lodging/Transportation while Pt is in Rehab?: No  Goals/Additional Needs Patient/Family Goal for Rehab: Min to mod PT, Min to Mod OT, Min to Mod SLP Expected length of stay: ELOS 3 weeks Dietary Needs: PEG; Dysphagia 1 (  puree) with thin liquids. Meds crushed with puree; full supervision; Equipment Needs: PEG; capped trach since 01/07/18. Plans for PCCM to decannualte 01/09/2018 Special Service Needs: fall bundle Pt/Family Agrees to Admission and willing to participate: Yes Program Orientation Provided & Reviewed with Pt/Caregiver Including Roles  & Responsibilities: Yes Additional Information Needs: Patietn lacks capacity to agree; son , Elmon Else in agreement via phone  Barriers to Discharge: Decreased caregiver support, Home environment access/layout, [place]line [place]ol, Lack of/limited family support, Insurance for SNF coverage, Nutrition means, Incontinence  Barriers to Discharge Comments: Decreaed burden of care via decannualtion during AIR admit; decrease caregivers needed for SNF placement  Patient Condition: Patient will benefit from ongoing PT, OT, and SLP offered from the coordinated team approach during an Inpatient Acute Rehabilitation admission. She will receive 3 hours of therapy p[er day as well as Rehabilitation physician, Nursing and Care Management interventions. She is currently mod to max assist with all mobility and ADLs. Patient can actively participate in an intensive therapy program of at least 3 hrs of therapy 5 days a week and make measurable gains while admitted. Anticipated discharge setting is Skilled Nursing placement. Family has agreed to participate in the Acute Inpatient Rehabilitation program. We will admit today 01/08/2018.  Preadmission Screen Completed By:  Cleatrice Burke, 01/08/2018 12:12 PM ______________________________________________________________________   Discussed status with Dr. Letta Pate  on  01/08/2018  at  1314 and received telephone approval for admission today.  Admission Coordinator:  Cleatrice Burke, time  1314 Date  10/3/12019   Assessment/Plan: Diagnosis:Left ICH, large requiring decompression via craniectomy 1. Does the need for close, 24 hr/day   Medical supervision in concert with the patient's rehab needs make it unreasonable for this patient to be served in a less intensive setting? Yes 2. Co-Morbidities requiring supervision/potential complications: Dysphagia, s/p PEG placement,  3. Due to bladder management, bowel management, safety, skin/wound care, disease management, medication administration, pain management and patient education, does the patient require 24 hr/day rehab nursing? Yes 4. Does the patient require coordinated care of a physician, rehab nurse, PT (1-2 hrs/day, 5 days/week), OT (1-2 hrs/day, 5 days/week) and SLP (.5-1 hrs/day, 5 days/week) to address physical and functional deficits in the context of the above medical diagnosis(es)? Yes Addressing deficits in the following areas: balance, endurance, locomotion, strength, transferring, bowel/bladder control, bathing, dressing, feeding, grooming, toileting, cognition, speech, swallowing and psychosocial support 5. Can the patient actively participate in an intensive therapy program of at least 3 hrs of therapy 5 days a week? Yes 6. The potential for patient to make measurable gains while on inpatient rehab is good 7. Anticipated functional outcomes upon discharge from inpatients  are: supervision PT, supervision OT, supervision SLP 8. Estimated rehab length of stay to reach the above functional goals is: 18-21d 9. Anticipated D/C setting: Home 10. Anticipated post D/C treatments: [facility] therapy 11. Overall Rehab/Functional Prognosis: good    RECOMMENDATIONS: This patient's condition is appropriate for continued rehabilitative care in the following setting: CIR Patient has agreed to participate in recommended program. Yes Note that insurance prior authorization may be required for reimbursement for recommended care.  Comment:  Cleatrice Burke 01/08/2018

## 2018-01-08 NOTE — Progress Notes (Signed)
Pt d/c to CIR. Pt is stable and has no new concerns. Report given to 4W receiving RN.

## 2018-01-08 NOTE — Progress Notes (Addendum)
Nutrition Follow-up  DOCUMENTATION CODES:   Not applicable  INTERVENTION:  Continue Ensure Enlive po BID, each supplement provides 350 kcal and 20 grams of protein  Continue free water flushes of 150 ml every 6 hours per tube. (MD to adjust as appropriate)  Encourage adequate PO intake.   NUTRITION DIAGNOSIS:   Inadequate oral intake related to inability to eat as evidenced by NPO status; diet advanced; improving  GOAL:   Patient will meet greater than or equal to 90% of their needs; progressing  MONITOR:   PO intake, Labs, Supplement acceptance, Weight trends, I & O's, Skin  REASON FOR ASSESSMENT:   Consult Enteral/tube feeding initiation and management  ASSESSMENT:   Pt with PMH of HTN with prescribed stimulant use admitted with large L hemorrhagic basal ganglia with 15 mm shift with early herniation s/p L frontotemporal craniotomy for evacuation of hematoma.  9/20 Decompressive, craniectomy. 10/1 trach/PEG placed. Pt aphasic.  Per MD, possible decannulation of trach 11/1. Pt continues on a dysphagia 1 diet with thin liquids. PO intake has been varied from 25-75% with most intake at least 50%. Noted pt with no po intake yet today at meals. Tube feeding has been discontinued per MD. Pt currently has Ensure ordered and has been consuming them. Plans for pt to discharge to CIR. Recommend continuation of Ensure to aid in caloric and protein needs.  Diet Order:   Diet Order            DIET - DYS 1 Room service appropriate? Yes; Fluid consistency: Thin  Diet effective now              EDUCATION NEEDS:   Not appropriate for education at this time  Skin:  Skin Assessment: Skin Integrity Issues: Skin Integrity Issues:: Stage II Stage II: coccyx Incisions: L head, abdomen  Last BM:  10/30  Height:   Ht Readings from Last 1 Encounters:  01/08/18 5\' 1"  (1.549 m)    Weight:   Wt Readings from Last 1 Encounters:  01/08/18 67.3 kg    Ideal Body Weight:  47.7  kg  BMI:  Body mass index is 28.03 kg/m.  Estimated Nutritional Needs:   Kcal:  1600-1800  Protein:  80-100 grams  Fluid:  > 1.6 L/day    Roslyn Smiling, MS, RD, LDN Pager # [phone] After hours/ weekend pager # [phone]

## 2018-01-08 NOTE — Progress Notes (Signed)
CSW alerted that patient discharging to CIR today. CSW alerted Admissions with [facility] that patient will be ready for transfer to SNF three weeks from today.  CSW signing off.  Blenda Nicely, [place] Clinical Social Worker [phone]

## 2018-01-09 ENCOUNTER — Inpatient Hospital Stay (HOSPITAL_COMMUNITY): Payer: Self-pay | Admitting: Occupational Therapy

## 2018-01-09 ENCOUNTER — Inpatient Hospital Stay (HOSPITAL_COMMUNITY): Payer: Self-pay | Admitting: Speech Pathology

## 2018-01-09 ENCOUNTER — Inpatient Hospital Stay (HOSPITAL_COMMUNITY): Payer: Self-pay | Admitting: Physical Therapy

## 2018-01-09 DIAGNOSIS — Z930 Tracheostomy status: Secondary | ICD-10-CM

## 2018-01-09 DIAGNOSIS — J9611 Chronic respiratory failure with hypoxia: Secondary | ICD-10-CM

## 2018-01-09 DIAGNOSIS — I611 Nontraumatic intracerebral hemorrhage in hemisphere, cortical: Secondary | ICD-10-CM

## 2018-01-09 DIAGNOSIS — I639 Cerebral infarction, unspecified: Secondary | ICD-10-CM

## 2018-01-09 DIAGNOSIS — R4701 Aphasia: Secondary | ICD-10-CM

## 2018-01-09 LAB — COMPREHENSIVE METABOLIC PANEL
ALT: 21 U/L (ref 0–44)
ANION GAP: 9 (ref 5–15)
AST: 17 U/L (ref 15–41)
Albumin: 3.3 g/dL — ABNORMAL LOW (ref 3.5–5.0)
Alkaline Phosphatase: 91 U/L (ref 38–126)
BILIRUBIN TOTAL: 0.7 mg/dL (ref 0.3–1.2)
BUN: 12 mg/dL (ref 6–20)
CHLORIDE: 107 mmol/L (ref 98–111)
CO2: 23 mmol/L (ref 22–32)
CREATININE: 0.82 mg/dL (ref 0.44–1.00)
Calcium: 10 mg/dL (ref 8.9–10.3)
Glucose, Bld: 90 mg/dL (ref 70–99)
POTASSIUM: 4 mmol/L (ref 3.5–5.1)
Sodium: 139 mmol/L (ref 135–145)
Total Protein: 7.4 g/dL (ref 6.5–8.1)

## 2018-01-09 LAB — CBC WITH DIFFERENTIAL/PLATELET
Abs Immature Granulocytes: 0.02 10*3/uL (ref 0.00–0.07)
BASOS PCT: 1 %
Basophils Absolute: 0.1 10*3/uL (ref 0.0–0.1)
EOS ABS: 0.4 10*3/uL (ref 0.0–0.5)
EOS PCT: 6 %
HCT: 36.3 % (ref 36.0–46.0)
Hemoglobin: 10.7 g/dL — ABNORMAL LOW (ref 12.0–15.0)
IMMATURE GRANULOCYTES: 0 %
LYMPHS ABS: 2.5 10*3/uL (ref 0.7–4.0)
Lymphocytes Relative: 38 %
MCH: 25.1 pg — AB (ref 26.0–34.0)
MCHC: 29.5 g/dL — AB (ref 30.0–36.0)
MCV: 85.2 fL (ref 80.0–100.0)
MONO ABS: 0.6 10*3/uL (ref 0.1–1.0)
MONOS PCT: 8 %
NEUTROS PCT: 47 %
Neutro Abs: 3.2 10*3/uL (ref 1.7–7.7)
Platelets: 293 10*3/uL (ref 150–400)
RBC: 4.26 MIL/uL (ref 3.87–5.11)
RDW: 20.3 % — AB (ref 11.5–15.5)
WBC: 6.7 10*3/uL (ref 4.0–10.5)
nRBC: 0 % (ref 0.0–0.2)

## 2018-01-09 MED ORDER — FREE WATER
150.0000 mL | Status: DC
  Administered 2018-01-09 – 2018-01-15 (×34): 150 mL

## 2018-01-09 MED ORDER — ENSURE ENLIVE PO LIQD
237.0000 mL | Freq: Three times a day (TID) | ORAL | Status: DC
  Administered 2018-01-09 – 2018-01-20 (×28): 237 mL via ORAL

## 2018-01-09 NOTE — Progress Notes (Addendum)
NAME:  Tammie Miles, MRN:  [id number], DOB:  09/13/1975, LOS: 1 ADMISSION DATE:  01/08/2018, CONSULTATION DATE:  11/27/2017 REFERRING MD:  Dr. Conchita Paris CHIEF COMPLAINT:  Left frontal hemorrhage  Brief History   42 yoF admitted 9/19 with AMS that began the evening of 9/18, initially thought due to intoxication. Remained unresponsive through am of 9/19.  Patient was brought to the ED where a CT was done and showed a large left sided ICH. Patient was taken to the OR and a mini craniectomy was done.  Patient was brought out to the ICU and PCCM was consulted for vent management. Course complicated by cerebral edema requiring 3% saline limited by hypernatremia. Also complicated by encephalopathy. Tolerating vent wean, but mental status limited extubation. Then 9/25 she developed what appeared to be seizures with gaze deviation and tongue biting. EEG done overnight with results pending and AEDs adjusted. No further seizure seen.   Significant Hospital Events   09/19 Admit/ OR- mini crani 09/20 Decompressive craniectomy ( L)  with skull harvesting in the abdomen 09/21 hypertonic saline started 09/23 hypertonic stopped.  09/25 Seizure. EEG done with no sz activity. AEDs adjusted. 10/01 Trach / PEG 10/02 Trach collar  10/28  Room air, #4 cuffless  10/29  Ambulated in hallway.  Trach capped.  10/31 Transferred to CIR 11/1    Trach removed   Consults: date of consult/date signed off & final recs  9/19 NSGY 9/19 PCCM   Procedures (surgical and bedside):  9/19 Intubated 9/19 OR for crani, evac of hematoma>> no change in neuro status post op 9/20 Decompressive craniectomy ( L)  with skull harvesting in the abdomen  Significant Diagnostic Tests:  9/19 [facility]  Large area of hemorrhage in the left basal ganglia and left frontal lobe, 5.8 x 3.5 cm. 15 mm of left-to-right midline shift. 9/19 CTA head: Positive for left MCA M1 large vessel occlusion, and also severe stenosis at the left ICA terminus,  superimposed on the relatively large acute left hemisphere intra-axial hemorrhage (estimated blood volume 43 mL). Vasospasm suspected in the proximal ACAs. 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 2. Negative for intracranial aneurysm, CTA spot sign, or evidence of vascular malformation in association with the acute hemorrhage. 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 3. Stable intracranial mass effect since 0938 hours today, with 16 mm of leftward midline shift and trapping of the right lateral ventricle. 11/29/2017 CT Head Large left MCA infarct is stable in distribution. Interval left frontal craniectomy with mild herniation of left frontal lobe through the defect. Resolved uncal herniation, improved patency of left lateral ventricle, and 6 mm left-to-right midline shift, previously 8 mm. Partial interval dispersion of acute hemorrhage in the left basal ganglia. Subcentimeter density anterior to the prior position of blood products possibly representing retracted clot or interval hemorrhage, attention at follow-up recommended. CT 9/24: 1. Left MCA distribution infarction is stable in distribution. Mild increased edema of the infarct with increased herniation of the left frontal lobe via the left frontal craniectomy. 2. Stable 6 mm left-to-right midline shift and partial effacement of left lateral ventricle. 3. Stable small hematoma within the left basal ganglia. 4. No new acute intracranial abnormality identified.  Micro Data: 9/19 MRSA PCR >> Negative 9/19 Sputum Culture >> normal flora 9/22 Blood x 2 >> negative 9/22 UA >> Normal 9/22 Sputum >>  few candida dubliniensis 12/13/2017 blood cultures x2>> negative 12/13/2017 urine culture>> negative 12/13/2017 sputum culture>> Citrobacter  Antimicrobials:  9/19  Cefazolin (pre-op) 9/20  Subjective:  Admitted to CIR 10/31, progressing PT/OT efforts No reports of dyspnea, secretions or desaturations.    Objective   Blood pressure 136/82, pulse 88, temperature 98.5 F (36.9 C), temperature source Axillary, resp. rate (!) 24, SpO2 98 %.    FiO2 (%):  [21 %] 21 %   Intake/Output Summary (Last 24 hours) at 01/09/2018 1013 Last data filed at 01/09/2018 0814 Gross per 24 hour  Intake 580 ml  Output -  Net 580 ml   There were no vitals filed for this visit.  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 P: Has passed her 72 hr capping trial.  Will decannulate today per RT Ongoing pulm hygiene, progressive PT/ OT efforts   Dysphagia: S/p PEG 9/30 P: SLP following, dysphagia 1 diet  Disposition / Summary of Today's Plan 01/09/18   Decannulation.  PCCM will sign off and be available as needed.      Labs   CBC: Recent Labs  Lab 01/04/18 1434 01/08/18 1850 01/09/18 0517  WBC 7.3 7.2 6.7  NEUTROABS  --   --  3.2  HGB 10.6* 11.0* 10.7*  HCT 35.5* 35.7* 36.3  MCV 84.3 85.0 85.2  PLT 285 289 293   Basic Metabolic Panel: Recent Labs  Lab 01/04/18 1434 01/09/18 0517  NA 139 139  K 4.1 4.0  CL 105 107  CO2 24 23  GLUCOSE 108* 90  BUN 14 12  CREATININE 0.82 0.82  CALCIUM 9.8 10.0   GFR: Estimated Creatinine Clearance: 78.4 mL/min (by C-G formula based on SCr of 0.82 mg/dL). Recent Labs  Lab  01/04/18 1434 01/08/18 1850 01/09/18 0517  WBC 7.3 7.2 6.7   Liver Function Tests: Recent Labs  Lab 01/09/18 0517  AST 17  ALT 21  ALKPHOS 91  BILITOT 0.7  PROT 7.4  ALBUMIN 3.3*   No results for input(s): LIPASE, AMYLASE in the last 168 hours. No results for input(s): AMMONIA in the last 168 hours. ABG    Component Value Date/Time   PHART 7.444 12/08/2017 0414   PCO2ART 36.7 12/08/2017 0414   PO2ART 109 (H) 12/08/2017 0414   HCO3 24.6 12/08/2017 0414   TCO2 27 12/05/2017 1216   ACIDBASEDEF 6.0 (H) 11/29/2017 0310   O2SAT 98.2 12/08/2017 0414    Coagulation Profile: No results for input(s): INR, PROTIME in the last 168 hours.   Cardiac Enzymes: No results for input(s): CKTOTAL, CKMB, CKMBINDEX, TROPONINI in the last 168 hours.   HbA1C: Hgb A1c MFr Bld  Date/Time Value Ref Range Status  11/28/2017 08:32 AM 5.8 (H) 4.8 - 5.6 % Final    Comment:    (NOTE) Pre diabetes:          5.7%-6.4% Diabetes:              >  6.4% Glycemic control for   <7.0% adults with diabetes    CBG: No results for input(s): GLUCAP in the last 168 hours.   Posey Boyer, AGACNP-BC [facility] Pgr: [phone] or if no answer [phone] 01/09/2018, 10:14 AM  ____________________________________________________________________________________  Attending Note:   Patient seen an examined with NP. Agree with the above documentation.   S: Patient tolerated trach capping trial for 72 hours. Remained on room air. Doing well this morning.  O: BP 136/82 (BP Location: Right Arm)   Pulse 88   Temp 98.5 F (36.9 C) (Axillary)   Resp (!) 24   Ht 5\' 1"  (1.549 m)   Wt 67.3 kg   SpO2 98%   BMI 28.03 kg/m   Gen: NAD, resting in bed,  Neck: trach in place  Heart: RRR, s1 s2 Lungs: CTAB, no wheeze   A: trach in place, chronic resp failure s/p stroke, tolerated 72 capping trial  P: Called and discussed de-cannulation with respiratory therapy. Placed order for  de-cannulation.  Place occlusive dressing. Keep covered during bathing or showers until stoma fully closed. Usually fully closed within a week.  Pulmonary will sign off. Please call with any questions.   Josephine Igo, DO [facility] 01/09/2018 10:42 AM  Personal pager: [phone] If unanswered, please page CCM On-call: #[phone]

## 2018-01-09 NOTE — Discharge Instructions (Signed)
Inpatient Rehab Discharge Instructions  NAIRI OSWALD Discharge date and time: No discharge date for patient encounter.   Activities/Precautions/ Functional Status: Activity: activity as tolerated Diet:  Wound Care: keep wound clean and dry Functional status:  ___ No restrictions     ___ Walk up steps independently ___ 24/7 supervision/assistance   ___ Walk up steps with assistance ___ Intermittent supervision/assistance  ___ Bathe/dress independently ___ Walk with walker     _x__ Bathe/dress with assistance ___ Walk Independently    ___ Shower independently ___ Walk with assistance    ___ Shower with assistance ___ No alcohol     ___ Return to work/school ________  Special Instructions:    My questions have been answered and I understand these instructions. I will adhere to these goals and the provided educational materials after my discharge from the hospital.  Patient/Caregiver Signature _______________________________ Date __________  Clinician Signature _______________________________________ Date __________  Please bring this form and your medication list with you to all your follow-up doctor's appointments.

## 2018-01-09 NOTE — Progress Notes (Signed)
Social Work  Social Work Assessment and Plan  Patient Details  Name: Tammie Miles MRN: [id number] Date of Birth: 02/27/1976  Today's Date: 01/09/2018  Problem List:  Patient Active Problem List   Diagnosis Date Noted  . Tracheostomy in place [facility])   . Pressure injury of skin 12/29/2017  . Altered mental status   . Central venous catheter in place   . 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 Hypertensive emergency 12/02/2017  . Anemia 12/02/2017  . Leukocytosis 12/02/2017  . Acute intra-cranial hemorrhage (HCC)   . Respiratory failure (HCC)   . ICH (intracerebral hemorrhage) (HCC) 11/27/2017  . Intracranial hemorrhage (HCC) 11/27/2017   Past Medical History:  Past Medical History:  Diagnosis Date  . ADHD   . Anxiety   . Depression   . Hypertension    Past Surgical History:  Social History:  reports that she drinks alcohol. Her tobacco and drug histories are not on file.  Family / Support Systems Marital Status: Single Patient Roles: Parent Children: son, Turner Daniels (83 yrs old) @ (C) [phone];  also, a 7yo daughter who is now living with her father/ paternal side of family. Other Supports: Ronta's father, Mickel Duhamel @ [phone] Anticipated Caregiver: SNF facility longterm Ability/Limitations of Caregiver: Guy Begin is 42 years old and was estranged pta; he has no plans to be her caregiver but does continue to want to be her decision maker Caregiver Availability: Other (Comment)(Plan for SNF after CIR) Family Dynamics: As noted, son was estranged from pt PTA but now involved and is primary Management consultant.    Social History Preferred language: English Religion: Baptist Cultural Background: NA Read: Yes Write: Yes Legal Hisotry/Current Legal Issues: None Guardian/Conservator: None - per MD, pt is not capable of making decisions on her own  behalf.  Defer to son.   Abuse/Neglect Abuse/Neglect Assessment Can Be Completed: Unable to assess, patient is non-responsive or altered mental status  Emotional Status Pt's affect, behavior adn adjustment status: Pt with significant cognitive and language deficits.  She is laying in bed and smiles when I introduce myself but non-verbal with me.  Does not currently appear in any emotional distress, however, will monitor through CIR stay and support as needed. Recent Psychosocial Issues: Son unaware of any issues. Pyschiatric History: per chart review, pt has h/o depression, however, unaware if receiveing any services. Substance Abuse History: son unaware  Patient / Family Perceptions, Expectations & Goals Pt/Family understanding of illness & functional limitations: Son with very basic understanding that pt suffered a CVA and of her current functional deficits and expected long term care needs. Premorbid pt/family roles/activities: Pt was independent and living with her 88 yo daughter. Anticipated changes in roles/activities/participation: Pt will continue to require 24/7 care for indefinite period of time.  Family unable to provide caregiver support. Pt/family expectations/goals: Son aware of her long-term care needs.  Community Resources [employer]: None Premorbid Home Care/DME Agencies: None Transportation available at discharge: no Resource referrals recommended: Neuropsychology  Discharge Planning Living Arrangements: Children Support Systems: Children, Other relatives Type of Residence: Private residence Insurance Resources: Customer service manager Screen Referred: Previously completed Living Expenses: Psychologist, sport and exercise Management: Patient Does the patient have any problems obtaining your medications?: Yes (Describe)(uninsured) Home Management: pt was independent Patient/Family Preliminary Plans: Plan for SNF following CIR. Sw Barriers to Discharge: Lack of/limited family support,  Insurance for SNF coverage Sw Barriers to Discharge Comments: Plan  for SNF with LOG Social Work Anticipated Follow Up Needs: SNF Expected length of stay: ELOS 3 weeks  Clinical Impression Unfortunate woman here following CVA and with extensive physical and cognitive deficits.  Family support very limited and primary decision maker left for 84 yo son who cannot provide care.  Plan known upon CIR that pt will need SNF once decanulated and tolerating well.    Chaney Ingram 01/09/2018, 5:04 PM

## 2018-01-09 NOTE — Progress Notes (Signed)
   01/09/18 1856  What Happened  Was fall witnessed? Yes  Who witnessed fall? Chana Bode  Patients activity before fall to/from bed, chair, or stretcher  Point of contact buttocks  Was patient injured? No  Patient found other (Comment) (standing at w/c)  Found by Staff-comment Gavin Pound)  Stated prior activity to/from bed, chair, or stretcher  Adult Fall Risk Assessment  Risk Factor Category (scoring not indicated) Fall has occurred during this admission (document High fall risk)  Age 42  Fall History: Fall within 6 months prior to admission 0  Elimination; Bowel and/or Urine Incontinence 0  Elimination; Bowel and/or Urine Urgency/Frequency 2  Medications: includes PCA/Opiates, Anti-convulsants, Anti-hypertensives, Diuretics, Hypnotics, Laxatives, Sedatives, and Psychotropics 5  Patient Care Equipment 2 (helmet-w/c)  Mobility-Assistance 2  Mobility-Gait 2  Mobility-Sensory Deficit 2  Altered awareness of immediate physical environment 1  Impulsiveness 2  Lack of understanding of one's physical/cognitive limitations 4  Total Score 22  Patient Fall Risk Level High fall risk  Adult Fall Risk Interventions  Required Bundle Interventions *See Row Information* High fall risk - low, moderate, and high requirements implemented  Additional Interventions Lap belt while in chair/wheelchair;Reorient/diversional activities with confused patients;Use of appropriate toileting equipment (bedpan, BSC, etc.)

## 2018-01-09 NOTE — Plan of Care (Signed)
LTGs established 01/09/18

## 2018-01-09 NOTE — Progress Notes (Signed)
Called Dr Posey Rea, informed of assisted fall. Called Turner Daniels and left message to ask for nurse. Notified on coming nurse of call made.

## 2018-01-09 NOTE — Progress Notes (Signed)
Physical Therapy Assessment and Plan  Patient Details  Name: Tammie Miles MRN: [id number] Date of Birth: 05-Dec-1975  PT Diagnosis: Abnormality of gait, Ataxia, Ataxic gait, Coordination disorder, Difficulty walking, Hemiparesis dominant, Hypertonia, Hypotonia, Impaired cognition, Impaired sensation and Muscle weakness Rehab Potential: Good ELOS: 14-16 days   Today's Date: 01/09/2018 PT Individual Time: 1100-1200 PT Individual Time Calculation (min): 60 min    Problem List:  Patient Active Problem List   Diagnosis Date Noted  . Tracheostomy in place [facility])   . Pressure injury of skin 12/29/2017  . Altered mental status   . Central venous catheter in place   . 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 Hypertensive emergency 12/02/2017  . Anemia 12/02/2017  . Leukocytosis 12/02/2017  . Acute intra-cranial hemorrhage ([place])   . Respiratory failure ([place])   . ICH (intracerebral hemorrhage) ([place]) 11/27/2017  . Intracranial hemorrhage ([place]) 11/27/2017    Past Medical History:  Past Medical History:  Diagnosis Date  . ADHD   . Anxiety   . Depression   . Hypertension    Past Surgical History:  The histories are not reviewed yet. Please review them in the "History" navigator section and refresh this [place].  Assessment & Plan Clinical Impression:   Tammie Miles is a 42 year old right-handed female history of hypertension, anxiety/depression and alcohol use. Per chart review patient lives with her boyfriend has an 53-year-old child independent prior to admission. Presented 11/27/2017 with altered mental status and became obtunded. Urine drug screen positive amphetamines(patient does take Adderall at home), alcohol negative, lactic acid 4.3. Cranial CT scan showed a large area of hemorrhage in the left basal ganglia and left frontal lobe, 5.8 x 3.5 cm with a 15 mm left to  right midline shift. CT angiogram of the head positive for left MCA M1 large vessel occlusion and also severe stenosis at the left ICA terminus. Negative for intracranial aneurysm. Neurosurgery Dr. Kathyrn Sheriff consulted and initially underwent left frontotemporal craniotomy for evacuation of hematoma 11/27/2017 followed by left decompressive craniectomy with skull flap placed inabdomen 11/28/2017 due to elevated ICP. Postoperative changes from craniotomy 8 to 9 mm right to left midline shift. Echocardiogram with ejection fraction 65%. Lower extremity Dopplers negative. An EEG completed 12/04/2017 showed no seizure and maintained on Keppra. 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 Mild increase edema of infarct with increased herniation of the left frontal lobe via the left frontal craniectomy. Stable 6 mm left to right midline shift and partial effacement of left lateral ventricle. Patient was later placed on aspirin for CVA prophylaxis. Hospital course with prolonged intubation and underwent tracheostomy tube placed by Dr. Nelda Marseille 12/09/2017 and curre   ntly with a #4 cuffless trach and no current plan to decannulate at this time. Speech therapy is working with [facility]. Capping trials underway per pulmonary services with minimal secretions await plan for possible decannulation 01/09/2018. PEG tube placement 12/08/2017 per Dr. Georganna Skeans. Dysphasia #1 thin liquid diet. Subcutaneous heparin initiated for DVT prophylaxis 12/15/2017. Ongoing bouts of restlessness. Therapy evaluations completed with recommendations of need for inpatient rehab services. Patient was admitted for a comprehensive rehab program. Patient transferred to CIR on 01/08/2018 .   Patient currently requires mod with mobility secondary to muscle weakness, abnormal tone, unbalanced muscle activation, motor apraxia, ataxia, decreased coordination and decreased motor planning, decreased attention to right,  right side neglect, decreased motor planning and ideational  apraxia, decreased initiation, decreased awareness, decreased safety awareness and delayed processing and decreased sitting balance, decreased standing balance, decreased postural control, hemiplegia and decreased balance strategies.  Prior to hospitalization, patient was independent  with mobility and lived with Daughter(7 yo) in a [place] home.  Home access is   .  Patient will benefit from skilled PT intervention to maximize safe functional mobility, minimize fall risk and decrease caregiver burden for planned discharge to SNF.  Anticipate patient will benefit from ongoing PT services at SNF level at discharge.  PT - End of Session Activity Tolerance: Tolerates 10 - 20 min activity with multiple rests Endurance Deficit: Yes PT Assessment Rehab Potential (ACUTE/IP ONLY): Good PT Barriers to Discharge: Decreased caregiver support;Medical stability;Lack of/limited family support PT Barriers to Discharge Comments: family unable to provide assist at d/c, pt to d/c to SNF PT Patient demonstrates impairments in the following area(s): Balance;Endurance;Motor;Perception;Safety;Sensory PT Transfers Functional Problem(s): Bed Mobility;Bed to Chair;Car;Furniture;Floor PT Locomotion Functional Problem(s): Ambulation;Wheelchair Mobility;Stairs PT Plan PT Intensity: Minimum of 1-2 x/day ,45 to 90 minutes PT Frequency: 5 out of 7 days PT Duration Estimated Length of Stay: 14-16 days PT Treatment/Interventions: Ambulation/gait training;Balance/vestibular training;Cognitive remediation/compensation;Discharge planning;Disease management/prevention;DME/adaptive equipment instruction;Functional electrical stimulation;Functional mobility training;Neuromuscular re-education;Pain management;Psychosocial support;Patient/family education;Therapeutic Activities;Therapeutic Exercise;UE/LE Strength taining/ROM;UE/LE Coordination activities;Visual/perceptual  remediation/compensation;Wheelchair propulsion/positioning PT Transfers Anticipated Outcome(s): Supervision PT Locomotion Anticipated Outcome(s): Supervision at w/c level PT Recommendation Follow Up Recommendations: Skilled nursing facility;24 hour supervision/assistance Patient destination: [place] (SNF) Equipment Recommended: To be determined(TBD by accepting facility) Equipment Details: TBD by accepting facility  Skilled Therapeutic Intervention Evaluation completed (see details above and below) with education on PT POC and goals and individual treatment initiated with focus on functional transfers and mobility assessment. Pt received supine in bed, agreeable to PT. Pt remains non-verbal throughout session and is unable to nod yes/no in response to questions. Pt exhibits no pain behaviors throughout session. Supine to sit with min A for trunk control. Dependent to don helmet while seated EOB. Squat pivot transfer bed to w/c with min A to the L. Pt is mod A for squat pivot transfers to the R. Attempt manual w/c propulsion with L UE/LE, pt requires min A and max cueing to attend to R visual field, x 15 ft. Sit to stand with mod A and no AD, pt becomes retropulsive and exhibits poor awareness of near LOB as well as poor ability to correct balance. Pt is min A to stand to RW. Ambulation x 50 ft with RW with R hand orthosis and min A for balance, w/c follow for safety. Pt exhibits scissoring gait pattern, narrow BOS, path deviation to the R and decrease RLE clearance and step length with gait. Car transfer with mod A with max multimodal cueing for safety. Pt exhibits fair ability following cueing. Pt left seated in w/c in room with quick release belt and chair alarm in place, call button in reach.   PT Evaluation Precautions/Restrictions Precautions Precautions: Fall Precaution Comments: trach site (decanulated 11/1), PEG, no bone flap Lt skull  Required Braces or Orthoses: Other  Brace/Splint Other Brace/Splint: Helmet when OOB. Ok to doff while lying in bed  Restrictions Weight Bearing Restrictions: No Home Living/Prior Functioning Home Living Available Help at Discharge: Other (Comment)(family cannot provide support at d/c, plan to d/c to SNF) Type of Home: Apartment Home Layout: One level Additional Comments: Info obtained from medical chart  Lives With: Daughter(7 yo) Prior Function Level of Independence: Independent with gait;Independent with transfers Comments: per chart,  pt unable to verbalize PLOF Vision/Perception  Perception Perception: Impaired Inattention/Neglect: Does not attend to right side of body;Does not attend to right visual field Body Part Identification: Does not attend to R UE or LE with transfers Praxis Praxis: Impaired Praxis Impairment Details: Ideomotor;Motor planning;Ideation Praxis-Other Comments: UB/LB dressing.   Cognition Overall Cognitive Status: No family/caregiver present to determine baseline cognitive functioning Arousal/Alertness: Awake/alert Orientation Level: Oriented to person Attention: Focused Focused Attention: Impaired Awareness: Impaired Awareness Impairment: Intellectual impairment;Emergent impairment;Anticipatory impairment Problem Solving: Impaired Safety/Judgment: Impaired Comments: unable to fully assess cognition due to aphasia and inability to verbalize Sensation Sensation Light Touch: Impaired by gross assessment(in RLE) Proprioception: Impaired by gross assessment(unable to formally assess but appears impaired in RLE) Coordination Gross Motor Movements are Fluid and Coordinated: No(R hemi) Fine Motor Movements are Fluid and Coordinated: No Coordination and Movement Description: impaired in BLE with gait Heel Shin Test: unable to assess Motor  Motor Motor: Hemiplegia;Abnormal tone;Clonus Motor - Skilled Clinical Observations: R hemi, clonus in RLE  Mobility Bed Mobility Bed Mobility:  Rolling Right;Rolling Left;Supine to Sit;Sit to Supine Rolling Right: Minimal Assistance - Patient > 75% Rolling Left: Minimal Assistance - Patient > 75% Supine to Sit: Minimal Assistance - Patient > 75% Sit to Supine: Minimal Assistance - Patient > 75% Transfers Transfers: Sit to Stand;Stand to Sit;Stand Pivot Transfers Sit to Stand: Minimal Assistance - Patient > 75%;Moderate Assistance - Patient 50-74%(during LB bathing/dressing) Stand to Sit: Minimal Assistance - Patient > 75% Stand Pivot Transfers: Minimal Assistance - Patient > 75%;Moderate Assistance - Patient 50 - 74% Stand Pivot Transfer Details: Tactile cues for initiation;Tactile cues for sequencing;Tactile cues for weight shifting;Tactile cues for posture;Tactile cues for placement;Tactile cues for weight beaing;Visual cues for safe use of DME/AE Stand Pivot Transfer Details (indicate cue type and reason): min A with RW, mod A with no AD Transfer (Assistive device): Rolling walker Locomotion  Gait Gait Distance (Feet): 50 Feet Assistive device: Rolling walker;Other (Comment)(R hand orthosis) Gait Gait Pattern: Impaired(scissoring, narrow BOS, path deviation to the R) Gait velocity: decreased Stairs / Additional Locomotion Stairs: No Wheelchair Mobility Wheelchair Mobility: Yes Wheelchair Assistance: Minimal assistance - Patient >75% Wheelchair Propulsion: Left upper extremity;Left lower extremity Wheelchair Parts Management: Needs assistance Distance: 15 ft  Trunk/Postural Assessment  Cervical Assessment Cervical Assessment: Within Functional Limits Thoracic Assessment Thoracic Assessment: Within Functional Limits Lumbar Assessment Lumbar Assessment: Exceptions to [facility] Postural Control Postural Control: Deficits on evaluation Righting Reactions: Unable to correct LOB when standing during perihygiene  Balance Balance Balance Assessed: Yes Static Sitting Balance Static Sitting - Balance Support: Feet supported;No  upper extremity supported Static Sitting - Level of Assistance: 5: Stand by assistance Dynamic Sitting Balance Dynamic Sitting - Balance Support: Feet supported;No upper extremity supported;During functional activity Dynamic Sitting - Level of Assistance: 4: Min assist(donning gripper socks EOB) Static Standing Balance Static Standing - Balance Support: During functional activity;No upper extremity supported Static Standing - Level of Assistance: 3: Mod assist Dynamic Standing Balance Dynamic Standing - Balance Support: Bilateral upper extremity supported;During functional activity Dynamic Standing - Level of Assistance: 3: Mod assist(Elevating pants over hips during dressing) Extremity Assessment  RUE Assessment RUE Assessment: Exceptions to [facility] Passive Range of Motion (PROM) Comments: Pt tolerated passive ROM 90 degrees shoulder flexion. Limited wrist extension  RUE Body System: Neuro Brunstrum levels for arm and hand: Arm;Hand Brunstrum level for arm: Stage I Presynergy Brunstrum level for hand: Stage I Flaccidity LUE Assessment LUE Assessment: Within Functional Limits RLE Assessment RLE Assessment: Exceptions  to [facility] Passive Range of Motion (PROM) Comments: [facility] General Strength Comments: 3/5 hip flex, unable to formally assess but <3/5 knee and ankle RLE Strength RLE Overall Strength: Deficits LLE Assessment LLE Assessment: Within Functional Limits Active Range of Motion (AROM) Comments: WFL General Strength Comments: 4/5 grossly    Refer to Care Plan for Long Term Goals  Recommendations for other services: None   Discharge Criteria: Patient will be discharged from PT if patient refuses treatment 3 consecutive times without medical reason, if treatment goals not met, if there is a change in medical status, if patient makes no progress towards goals or if patient is discharged from hospital.  The above assessment, treatment plan, treatment alternatives and goals were  discussed and mutually agreed upon: No family available/patient unable  Excell Seltzer, PT, DPT  01/09/2018, 12:21 PM

## 2018-01-09 NOTE — Progress Notes (Signed)
[place] PHYSICAL MEDICINE & REHABILITATION PROGRESS NOTE   Subjective/Complaints:    Objective:   No results found. Recent Labs    01/08/18 1850 01/09/18 0517  WBC 7.2 6.7  HGB 11.0* 10.7*  HCT 35.7* 36.3  PLT 289 293   Recent Labs    01/09/18 0517  NA 139  K 4.0  CL 107  CO2 23  GLUCOSE 90  BUN 12  CREATININE 0.82  CALCIUM 10.0    Intake/Output Summary (Last 24 hours) at 01/09/2018 0858 Last data filed at 01/09/2018 0814 Gross per 24 hour  Intake 580 ml  Output -  Net 580 ml     Physical Exam: Vital Signs Blood pressure 136/82, pulse 88, temperature 98.5 F (36.9 C), temperature source Axillary, resp. rate (!) 24, SpO2 98 %. Constitutional: No distress . Vital signs reviewed. HEENT: EOMI, oral membranes moist Neck: supple, #4 shiley capped Cardiovascular: RRR without murmur. No JVD    Respiratory: CTA Bilaterally without wheezes or rales. Normal effort    GI: BS +, non-tender, non-distended, PEG in place Neurological: Patient makes good eye contact. globally aphasic.She would not follow commands. Motor strength is 0/5 RUE and RLE.  LUE and LLE grossly 4+ to 5/5   Sensation winces to pinch on left side as well as in right lower extremity, no reaction to pinch right upper extremity 3-4 beats of clonus Right ankle, DTR's 3+ on right Patient is able to follow gestures but not verbal commands. She is attentive and cooperative    Assessment/Plan: 1. Functional deficits secondary to large left MCA infarct which require 3+ hours per day of interdisciplinary therapy in a comprehensive inpatient rehab setting.  Physiatrist is providing close team supervision and 24 hour management of active medical problems listed below.  Physiatrist and rehab team continue to assess barriers to discharge/monitor patient progress toward functional and medical goals  Care Tool:  Bathing              Bathing assist       Upper Body Dressing/Undressing Upper  body dressing        Upper body assist      Lower Body Dressing/Undressing Lower body dressing            Lower body assist       Toileting Toileting    Toileting assist Assist for toileting: Maximal Assistance - Patient 25 - 49%(changed brief )     Transfers Chair/bed transfer  Transfers assist           Locomotion Ambulation   Ambulation assist              Walk 10 feet activity   Assist           Walk 50 feet activity   Assist           Walk 150 feet activity   Assist           Walk 10 feet on uneven surface  activity   Assist           Wheelchair     Assist               Wheelchair 50 feet with 2 turns activity    Assist            Wheelchair 150 feet activity     Assist          Medical Problem List and Plan: 1. secondary to acute large left MCA infarction with  large hemorrhagic transformation with uncal herniation status post hemicraniotomy 11/28/2017 for evacuation of hematoma and increased ICP due to left MCA occlusion  -beginning therapies today 2. DVT Prophylaxis/Anticoagulation: Subcutaneous heparin 12/15/2017. Venous Doppler studies negative 3. Pain Management:Tylenol as needed 4. Mood/history of BJY:NWGNFAO had been on Adderall prior to admission. Provide emotional support 5. Neuropsych: This patientis notcapable of making decisions on herown behalf. 6. Skin/Wound Care:Routine skin checks 7. Fluids/Electrolytes/Nutrition: encourage PO, will need supervision for initiation when eating most likely  -I personally reviewed the patient's labs today.   8.Seizure prophylaxis. 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 Presently #4 cuffless trach.Patient tolerating capping trials no signs of distress minimal secretions. Await plan for possible decannulation today per pulmonary?? 10.Dysphagia. Dysphagia #1 thin liquid diet. PEG tube 12/08/2017.  Continue supplement as needed 11.Hypertension. Norvasc 10 mg daily, labetalol 300 mg 3 times daily. Monitor with increased mobility 12. Acute blood loss anemia.   -hgb hovering around 10.7-11    LOS: 1 days A FACE TO FACE EVALUATION WAS PERFORMED  Ranelle Oyster 01/09/2018, 8:58 AM

## 2018-01-09 NOTE — Progress Notes (Signed)
Initial Nutrition Assessment  DOCUMENTATION CODES:   Not applicable  INTERVENTION:   - Given eagerness to eat, no TF recommendations at this time - Increase Ensure Enlive to TID, each providing 350 kcal and 20 g protein - Increase free water flushes to 150 mL q 4 hrs, providing 900 mL water daily - Continue liquid MVI, ferrous sulfate, folic acid, thiamine supplementation  NUTRITION DIAGNOSIS:   Inadequate oral intake related to inability to eat as evidenced by estimated needs.  GOAL:   Patient will meet greater than or equal to 90% of their needs  MONITOR:   PO intake, Skin, Diet advancement, Supplement acceptance  REASON FOR ASSESSMENT:   Consult Enteral/tube feeding initiation and management  ASSESSMENT:   Pt with PMH of HTN with prescribed stimulant use admitted with large L hemorrhagic basal ganglia with 15 mm shift with early herniation s/p L frontotemporal craniotomy for evacuation of hematoma.  9/20 Decompressive,craniectomy. 10/1 trach/PEG placed.Pt aphasic. 10/31 pt admitted to rehab.  Labs: Hgb 10.7 Meds: pepcid, Ensure Enlive BID, ferrous sulfate 325 mg, folic acid 1 mg, 150 mL free water q 6 hrs via PEG, MVI liquid, psyllium 1 pkt BID, thiamine 100 mg  Pt in therapy at time of visit. Per nsg, pt hungry and eager to eat. Has difficulty following cues. Pt consuming 100% of Ensure supplements, 60-75% of meals. Does not want Magic Cup. Free water flushes very small - will increase to 150 mL q 4 hrs to better meet hydration needs. Will increase Ensure Enlive to TID. Pt likely due for swallow evaluation soon.  NUTRITION - FOCUSED PHYSICAL EXAM: deferred 11/1 - pt in therapy  Diet Order:  60-75% of 2 meals documented, per nsg Diet Order            DIET - DYS 1 Room service appropriate? Yes; Fluid consistency: Thin  Diet effective now              EDUCATION NEEDS:  Not appropriate for education at this time  Skin:  Skin Assessment: Skin Integrity  Issues: Skin Integrity Issues:: Stage II Stage II: coccyx Incisions: L head, abdomen  Last BM:  10/30, type 5  Height:  Ht Readings from Last 1 Encounters:  01/09/18 5\' 1"  (1.549 m)    Weight:  Wt Readings from Last 1 Encounters:  01/09/18 67.3 kg    Ideal Body Weight:  47.7 kg  BMI:  Body mass index is 28.03 kg/m.  Estimated Nutritional Needs:   Kcal:  1782 kcal/day(MSJ x 1.4)  Protein:  80-100 g protein/day(1.2 - 1.5 g/kg ABW)  Fluid:  1 mL/kcal or per MD discretion  Jolaine Artist, MS, RDN, LDN On-call pager: [phone]

## 2018-01-09 NOTE — Progress Notes (Signed)
Inpatient Rehabilitation  Patient information reviewed and entered into eRehab system by Shepherd Finnan M. Justin Buechner, M.A., CCC/SLP, PPS Coordinator.  Information including medical coding, functional ability and quality indicators will be reviewed and updated through discharge.    

## 2018-01-09 NOTE — IPOC Note (Signed)
Overall Plan of Care [facility]) Patient Details Name: Tammie Miles MRN: [id number] DOB: Nov 08, 1975  Admitting Diagnosis: <principal problem not specified>  Hospital Problems: Active Problems:   ICH (intracerebral hemorrhage) (HCC)   Tracheostomy in place [facility])     Functional Problem List: Nursing Nutrition, Bladder, Bowel, Safety, Medication Management, Motor  PT Balance, Endurance, Motor, Perception, Safety, Sensory  OT Balance, Perception, Cognition, Safety, Sensory, Endurance, Skin Integrity, Motor, Vision  SLP    TR         Basic ADL's: OT Eating, Grooming, Bathing, Dressing, Toileting     Advanced  ADL's: OT Simple Meal Preparation, Light Housekeeping, Laundry     Transfers: PT Bed Mobility, Bed to Chair, Set designer, [employer], Civil Service fast streamer, Research scientist (life sciences): PT Ambulation, Psychologist, prison and probation services, Stairs     Additional Impairments: OT Fuctional Use of Upper Extremity  SLP        TR      Anticipated Outcomes Item Anticipated Outcome  Self Feeding Supervision/cuing  Swallowing      Basic self-care  Supervision/cuing  Toileting  Supervision/cuing    Bathroom Transfers Supervision/cuing   Bowel/Bladder  to be continent x 2, LBM 10/29  Transfers  Supervision  Locomotion  Supervision at w/c level  Communication     Cognition     Pain  Pt will be able to verbalize pain with min assistance  Safety/Judgment  pt will remain fall free while in rehab   Therapy Plan: PT Intensity: Minimum of 1-2 x/day ,45 to 90 minutes PT Frequency: 5 out of 7 days PT Duration Estimated Length of Stay: 14-16 days OT Intensity: Minimum of 1-2 x/day, 45 to 90 minutes OT Frequency: 5 out of 7 days OT Duration/Estimated Length of Stay: 14-16 days      Team Interventions: Nursing Interventions Patient/Family Education, Disease Management/Prevention, Skin Care/Wound Management, Discharge Planning, Bladder Management, Cognitive Remediation/Compensation, Psychosocial  Support, Bowel Management, Dysphagia/Aspiration Precaution Training  PT interventions Ambulation/gait training, Balance/vestibular training, Cognitive remediation/compensation, Discharge planning, Disease management/prevention, DME/adaptive equipment instruction, Functional electrical stimulation, Functional mobility training, Neuromuscular re-education, Pain management, Psychosocial support, Patient/family education, Therapeutic Activities, Therapeutic Exercise, UE/LE Strength taining/ROM, UE/LE Coordination activities, Visual/perceptual remediation/compensation, Wheelchair propulsion/positioning  OT Interventions Balance/vestibular training, Discharge planning, Pain management, Functional electrical stimulation, Self Care/advanced ADL retraining, Therapeutic Activities, UE/LE Coordination activities, Visual/perceptual remediation/compensation, Therapeutic Exercise, Skin care/wound managment, Patient/family education, Functional mobility training, Disease mangement/prevention, Cognitive remediation/compensation, Community reintegration, Fish farm manager, Neuromuscular re-education, Psychosocial support, Splinting/orthotics, UE/LE Strength taining/ROM, Wheelchair propulsion/positioning  SLP Interventions    TR Interventions    SW/CM Interventions Discharge Planning, Patient/Family Education, Psychosocial Support   Barriers to Discharge MD  Medical stability  Nursing Incontinence, Behavior, Trach    PT Decreased caregiver support, Medical stability, Lack of/limited family support family unable to provide assist at d/c, pt to d/c to SNF  OT Medical stability, [name], Incontinence D/c SNF  SLP      SW       Team Discharge Planning: Destination: PT-Skilled Nursing Facility (SNF) ,OT- Skilled Nursing Facility (SNF) , SLP-  Projected Follow-up: PT-Skilled nursing facility, 24 hour supervision/assistance, OT-  Skilled nursing facility, SLP-  Projected Equipment Needs: PT-To be  determined(TBD by accepting facility), OT- To be determined, SLP-  Equipment Details: PT-TBD by accepting facility, OT-  Patient/family involved in discharge planning: PT- Patient unable/family or caregiver not available,  OT-Patient unable/family or caregiver not available, SLP-   MD ELOS: 14-18 days Medical Rehab Prognosis:  Excellent Assessment: The patient has  been admitted for CIR therapies with the diagnosis of left sided ICH. The team will be addressing functional mobility, strength, stamina, balance, safety, adaptive techniques and equipment, self-care, bowel and bladder mgt, patient and caregiver education, NMR, spasiticity mgt, pain control, nutrition, swallowing, communication annd language. Goals have been set at supervision with mobility and self-care and min to mod assist with swallowing and communication.    Ranelle Oyster, MD, FAAPMR      See Team Conference Notes for weekly updates to the plan of care

## 2018-01-09 NOTE — Progress Notes (Signed)
1610 trach removed per order, placed a gauze pad and tape saturation remained at 100%, will continue to monitor patient.

## 2018-01-09 NOTE — Evaluation (Signed)
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 Tracheostomy in place [facility])   . Pressure injury of skin 12/29/2017  . Altered mental status   . Central venous catheter in place   . 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 Hypertensive emergency 12/02/2017  . Anemia 12/02/2017  . Leukocytosis 12/02/2017  . Acute intra-cranial hemorrhage ([place])   . Respiratory failure ([place])   . ICH (intracerebral hemorrhage) ([place]) 11/27/2017  . Intracranial hemorrhage ([place]) 11/27/2017    Past Medical History:  Past Medical History:  Diagnosis Date  . ADHD   . Anxiety   . Depression   . Hypertension     Assessment & Plan Clinical Impression: Tammie Miles is a 42 year old right-handed female history of hypertension, anxiety/depression and alcohol use. Presented 11/27/2017 with altered mental status and became obtunded. Urine drug screen positive amphetamines(patient does take Adderall at home), alcohol negative, lactic acid 4.3. Cranial CT scan showed a large area of hemorrhage in the left basal ganglia and left frontal lobe, 5.8 x 3.5 cm with a 15 mm left to right midline shift. CT angiogram of the head positive for left MCA M1 large vessel occlusion and also severe stenosis at the left ICA terminus. Negative for intracranial aneurysm. Neurosurgery Dr. Kathyrn Sheriff consulted and initially underwent left frontotemporal craniotomy  for evacuation of hematoma 9/19/2019skull flap placed inabdomen 11/28/2017 due to elevated ICP. Postoperative changes from craniotomy 8 to 9 mm right to left midline shift. Echocardiogram with ejection fraction 65%. Lower extremity Dopplers negative. An EEG completed 12/04/2017 showed no seizure and maintained on Keppra. 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 Mild increase edema of infarct with increased herniation of the left frontal lobe via the left frontal craniectomy. Stable 6 mm left to right midline shift and partial effacement of left lateral ventricle. Patient was later placed on aspirin for CVA prophylaxis. Hospital course with prolonged intubation and underwent tracheostomy tube placed by Dr. Nelda Marseille 12/09/2017 and currently with a #4cuff lesstrach and no current plan to de cannulateat this time. Speech therapy is working with[facility]. Capping trials underway per pulmonary services with minimal secretions await plan for possible decannulation 01/09/2018. PEG tube placement 12/08/2017 per Dr. Georganna Skeans. Dysphasia #1 thin liquid diet. Subcutaneous heparin initiated for DVT prophylaxis 12/15/2017. Ongoing bouts of restlessness.    Pt currently requires Mod A with basic self care tasks due to decreased sitting and standing balance, Rt inattention, Rt sided neglect, apraxia, decreased attention, decreased safety awareness and decreased motor planning. Patient will benefit from skilled intervention to increase independence with basic self-care skills prior to discharge SNF.    OT - End of Session Endurance Deficit: Yes OT Assessment Rehab Potential (ACUTE ONLY): Good OT Barriers to Discharge: Medical stability;Trach;Incontinence OT Barriers to Discharge Comments: D/c SNF OT Patient demonstrates impairments in the following area(s): Balance;Perception;Cognition;Safety;Sensory;Endurance;Skin Integrity;Motor;Vision OT Basic ADL's Functional Problem(s):  Eating;Grooming;Bathing;Dressing;Toileting OT Advanced ADL's Functional Problem(s): Simple Meal Preparation;Light Housekeeping;Laundry OT Transfers Functional Problem(s):  Toilet;Tub/Shower OT Additional Impairment(s): Fuctional Use of Upper Extremity OT Plan OT Intensity: Minimum of 1-2 x/day, 45 to 90 minutes OT Frequency: 5 out of 7 days OT Duration/Estimated Length of Stay: 14-16 days OT Treatment/Interventions: Balance/vestibular training;Discharge planning;Pain management;Functional electrical stimulation;Self Care/advanced ADL retraining;Therapeutic Activities;UE/LE Coordination activities;Visual/perceptual remediation/compensation;Therapeutic Exercise;Skin care/wound managment;Patient/family education;Functional mobility training;Disease mangement/prevention;Cognitive remediation/compensation;Community reintegration;DME/adaptive equipment instruction;Neuromuscular re-education;Psychosocial support;Splinting/orthotics;UE/LE Strength taining/ROM;Wheelchair propulsion/positioning OT Self Feeding Anticipated Outcome(s): Supervision/cuing OT Basic Self-Care Anticipated Outcome(s): Supervision/cuing OT Toileting Anticipated Outcome(s): Supervision/cuing  OT Bathroom Transfers Anticipated Outcome(s): Supervision/cuing  OT Recommendation Recommendations for Other Services: Therapeutic Recreation consult Therapeutic Recreation Interventions: Pet therapy Patient destination: [place] (SNF) Follow Up Recommendations: Skilled nursing facility Equipment Recommended: To be determined  Skilled Therapeutic Intervention Skilled OT session completed with focus on initial evaluation, education on OT role/POC, and establishment of patient centered goals. RT was decannulating pt at time of arrival. Her 02 sats remained between 96-97% on RA throughout session, even during standing tasks. Helmet donned by therapist prior to activity. She completed B/D EOB sit<stand. Min A sit<stand with RW and  Mod A for dynamic balance during LB tasks. Steady assist for dynamic sitting balance. She exhibited Rt inattention, flaccid R UE, and motor planning deficits. Pt also inconsistent with following 1 step instruction. At end of session pt returned to bed and was left with call bell in lap, 4 bedrails up and bed alarm set.   Pt did not speak during session, though visibly tried to communicate verbally.   2nd Session 1:1 tx (28 min) Pt greeted in w/c with helmet donned. No s/s pain. Friend present and pt smiling and using cheerful gestures while interacting with her. Started tx with toilet transfer, with pt completing stand pivot<low toilet with Mod-Max A for weight shift and supporting Rt side. With increased time, pt successful of B+B void! She completed perihygiene while seated and standing. Mod A for dynamic balance when standing for perihygiene and elevating pants over hips. Assist required for fully advancing pants on Rt side due to inattention. She transferred back to w/c and washed hands w/c level with HOH assist. Able to scan for soap bottle placed on Rt side without cuing. Her friend was still present, asking appropriate questions. Educated her that only staff can supervision during self feeding and to receive consent from RN staff before bringing food/beverages to pts room. Also educated friend that she cannot escorted pt outside without grounds pass. At end of session pt completed stand pivot<bed with Mod A. She was repositioned in sidelying to protect hemiplegic side. Pt left with all needs and bed alarm set.   OT Evaluation Precautions/Restrictions  Precautions Precautions: Fall Precaution Comments: trach site (decanulated 11/1), PEG, no bone flap Lt skull  Required Braces or Orthoses: Other Brace/Splint Other Brace/Splint: Helmet when OOB. Ok to doff while lying in bed  Restrictions Weight Bearing Restrictions: No General Chart Reviewed: Yes Family/Caregiver Present: No Pain No s/s pain  during tx.    Home Living/Prior Functioning Home Living Available Help at Discharge: Other (Comment)(family cannot provide support at d/c, plan to d/c to SNF) Type of Home: Apartment Home Layout: One level Additional Comments: Info obtained from medical chart  Lives With: Daughter(7 y/o dtr) IADL History Homemaking Responsibilities: (Per chart, pt Independent with ADLs/IADLs PTA) Occupation: (Pt did not speak during eval, and could not provide information regarding occupation or hobbies) Prior Function Level of Independence: Independent with gait, Independent with transfers Comments: per chart, pt unable to verbalize PLOF ADL ADL Eating:  Not assessed Grooming: Not assessed Upper Body Bathing: Moderate assistance Where Assessed-Upper Body Bathing: Edge of bed Lower Body Bathing: Moderate assistance Where Assessed-Lower Body Bathing: Edge of bed Upper Body Dressing: Moderate assistance Where Assessed-Upper Body Dressing: Edge of bed Lower Body Dressing: Moderate assistance Toileting: Not assessed Toilet Transfer: Not assessed Social research officer, government: Not assessed Vision Baseline Vision/History: (Unknown ) Patient Visual Report: (Unable to verbally convey information) Vision Assessment?: Vision impaired- to be further tested in functional context Perception  Perception: Impaired Inattention/Neglect: Does not attend to right side of body;Does not attend to right visual field Body Part Identification: When asked to attend to R UE during self care, pt instead attends to flank or stomach  Praxis Praxis: Impaired Praxis Impairment Details: Ideomotor;Motor planning;Ideation Praxis-Other Comments: UB/LB dressing.  Cognition Overall Cognitive Status: No family/caregiver present to determine baseline cognitive functioning Arousal/Alertness: Awake/alert Orientation Level: Nonverbal/unable to assess Year: (Pt unable to respond to orientation questions when given yes/no  options) Immediate Memory Recall: (Did not speak when asked) Attention: Focused Focused Attention: Impaired Awareness: Impaired Safety/Judgment: Impaired Comments: unable to fully assess cognition due to aphasia Sensation Sensation Light Touch: Impaired by gross assessment(in R UE) No physical reaction from deep pressure touch or pinch  Coordination Gross Motor Movements are Fluid and Coordinated: No(R hemi) Fine Motor Movements are Fluid and Coordinated: No Motor  Motor Motor: Hemiplegia;Abnormal tone;Clonus Motor - Skilled Clinical Observations: R hemi, clonus in RLE Mobility  Transfers Sit to Stand: Minimal Assistance - Patient > 75%;Moderate Assistance - Patient 50-74%(during LB bathing/dressing) Stand to Sit: Minimal Assistance - Patient > 75%  Trunk/Postural Assessment  Cervical Assessment Cervical Assessment: Within Functional Limits Thoracic Assessment Thoracic Assessment: Within Functional Limits Lumbar Assessment Lumbar Assessment: Exceptions to [facility] Postural Control Postural Control: Deficits on evaluation Righting Reactions: Unable to correct LOB when standing during perihygiene  Balance Balance Balance Assessed: Yes Dynamic Sitting Balance Dynamic Sitting - Level of Assistance: 4: Min assist(donning gripper socks EOB) Dynamic Standing Balance Dynamic Standing - Level of Assistance: 3: Mod assist(Elevating pants over hips during dressing) Extremity/Trunk Assessment RUE Assessment RUE Assessment: Exceptions to [facility] Passive Range of Motion (PROM) Comments: Pt tolerated passive ROM 90 degrees shoulder flexion. Limited wrist extension  RUE Body System: Neuro Brunstrum levels for arm and hand: Arm;Hand Brunstrum level for arm: Stage I Presynergy Brunstrum level for hand: Stage I Flaccidity LUE Assessment LUE Assessment: Within Functional Limits     Refer to Care Plan for Long Term Goals  Recommendations for other services: Therapeutic Recreation  Pet  therapy   Discharge Criteria: Patient will be discharged from OT if patient refuses treatment 3 consecutive times without medical reason, if treatment goals not met, if there is a change in medical status, if patient makes no progress towards goals or if patient is discharged from hospital.  The above assessment, treatment plan, treatment alternatives and goals were discussed and mutually agreed upon: by patient  Skeet Simmer 01/09/2018, 12:10 PM

## 2018-01-09 NOTE — Evaluation (Signed)
Speech Language Pathology Assessment and Plan  Patient Details  Name: ALEAH AHLGRIM MRN: [id number] Date of Birth: 1975/04/04  SLP Diagnosis: Aphasia;Apraxia;Dysphagia;Cognitive Impairments  Rehab Potential: Fair ELOS: 2 weeks    Today's Date: 01/09/2018 SLP Individual Time: 1430-1530 SLP Individual Time Calculation (min): 60 min   Problem List:  Patient Active Problem List   Diagnosis Date Noted  . Tracheostomy in place [facility])   . Pressure injury of skin 12/29/2017  . Altered mental status   . Central venous catheter in place   . 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 Stroke (cerebrum) ([place]) 12/02/2017  . 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 Hypertensive emergency 12/02/2017  . Anemia 12/02/2017  . Leukocytosis 12/02/2017  . Acute intra-cranial hemorrhage ([place])   . Respiratory failure ([place])   . ICH (intracerebral hemorrhage) ([place]) 11/27/2017  . Intracranial hemorrhage ([place]) 11/27/2017   Past Medical History:  Past Medical History:  Diagnosis Date  . ADHD   . Anxiety   . Depression   . Hypertension    Past Surgical History:   Assessment / Plan / Recommendation Clinical Impression    Alajah Witman is a 42 year old right-handed female history of hypertension, anxiety/depression and alcohol use.  Per chart review patient lives with her boyfriend has an 50-year-old child independent prior to admission.  Presented 11/27/2017 with altered mental status and became obtunded.  Urine drug screen positive amphetamines (patient does take Adderall at home), alcohol negative, lactic acid 4.3.  Cranial CT scan showed a large area of hemorrhage in the left basal ganglia and left frontal lobe, 5.8 x 3.5 cm with a 15 mm left to right midline shift.  CT angiogram of the head positive for left MCA M1 large vessel occlusion and also severe stenosis at the left ICA terminus.  Neurosurgery Dr. Kathyrn Sheriff consulted and initially underwent left frontotemporal  craniotomy for evacuation of hematoma 11/27/2017 followed by left decompressive craniectomy with skull flap placed in abdomen 11/28/2017 due to elevated ICP.    Postoperative changes from craniotomy 8 to 9 mm right to left midline shift. 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  Mild increase edema of infarct with increased herniation of the left frontal lobe via the left frontal craniectomy.  Stable 6 mm left to right midline shift and partial effacement of left lateral ventricle.  Hospital course with prolonged intubation and underwent tracheostomy tube placed by Dr. Nelda Marseille 12/09/2017 and currently with a #4 cuffless trach and no current plan to decannulate at this time.  Speech therapy is working with [facility].  Capping trials underway per pulmonary services with minimal secretions await plan for possible decannulation 01/09/2018.  PEG tube placement 12/08/2017 per Dr. Georganna Skeans.  Dysphasia #1 thin liquid diet.  Ongoing bouts of restlessness.  Therapy evaluations completed with recommendations of need for inpatient rehab services.    Patient was admitted for a comprehensive rehab program on 01/08/18. Pt was decannulated on 01/09/18. Bedside swallow evaluation and cognitive linguistic evaluations completed on 01/09/18 after decannulation. Pt consumed several bites of puree without overt s/s aspiration or anterior loss and consumed thin liquids via straw without overt s/s of aspiration. Pt consumed very small bite of dysphagia 2 solid without issue but more trials recommended before upgraded. Information updated at bedside regarding current diet. Pt continues to demonstrate global aphasia and s/s of apraxia. Pt is nonverbal and unable to follow directions despite Max A multimodal cues. She is oriented to herself and makes good contact  with speaker but isn't able to sustain attention to basic task for completion. No efforts made for voicing or speaking despite Max A cues. Skilled ST is  required to target the above mentioned deficits with anticipation that pt will discharge to SNF at reduced burden of care.    Skilled Therapeutic Interventions          Skilled treatment session focused on completion of above mentioned evaluations, see above.    SLP Assessment  Patient will need skilled Speech Lanaguage Pathology Services during CIR admission    Recommendations  SLP Diet Recommendations: Dysphagia 1 (Puree);Thin Liquid Administration via: Cup;Straw Medication Administration: Crushed with puree Supervision: Staff to assist with self feeding;Full supervision/cueing for compensatory strategies Compensations: Slow rate;Small sips/bites;Minimize environmental distractions;Follow solids with liquid Postural Changes and/or Swallow Maneuvers: Seated upright 90 degrees Oral Care Recommendations: Oral care BID Patient destination: [place] (SNF) Follow up Recommendations: Skilled Nursing facility Equipment Recommended: None recommended by SLP    SLP Frequency 3 to 5 out of 7 days   SLP Duration  SLP Intensity  SLP Treatment/Interventions 2 weeks  Minumum of 1-2 x/day, 30 to 90 minutes  Cognitive remediation/compensation;Cueing hierarchy;Dysphagia/aspiration precaution training;Multimodal communication approach;Patient/family education;Speech/Language facilitation;Therapeutic Activities;Functional tasks    Pain Pain Assessment Pain Scale: Faces Faces Pain Scale: No hurt  Prior Functioning Cognitive/Linguistic Baseline: Within functional limits Type of Home: Apartment  Lives With: Daughter(7 y/o) Available Help at Discharge: (family cannot provide support plan to d/c to SNF)  Short Term Goals: Week 1: SLP Short Term Goal 1 (Week 1): Pt will communicate wants and needs via multimodal communication in 5 out of 10 opportunties with Max A cues.  SLP Short Term Goal 2 (Week 1): Pt will produce voicing in 25% of opportunties with Max A cues.  SLP Short Term  Goal 3 (Week 1): Pt will sustain attention to functional task for ~ 5 minutes with Max A cues.  SLP Short Term Goal 4 (Week 1): Pt will complete basic familiar tasks related to ADLs with Max A cues.  SLP Short Term Goal 5 (Week 1): Pt will consume trials of dysphagia 2 with minimal overt s/s of dysphagia and Max A cues to demonstrate readiness for diet upgrade.   Refer to Care Plan for Long Term Goals  Recommendations for other services: None   Discharge Criteria: Patient will be discharged from SLP if patient refuses treatment 3 consecutive times without medical reason, if treatment goals not met, if there is a change in medical status, if patient makes no progress towards goals or if patient is discharged from hospital.  The above assessment, treatment plan, treatment alternatives and goals were discussed and mutually agreed upon: No family available/patient unable  Dorean Daniello 01/09/2018, 4:46 PM

## 2018-01-10 DIAGNOSIS — I615 Nontraumatic intracerebral hemorrhage, intraventricular: Secondary | ICD-10-CM

## 2018-01-10 NOTE — Progress Notes (Signed)
Speech Language Pathology Daily Session Note  Patient Details  Name: Tammie Miles MRN: [id number] Date of Birth: April 14, 1975  Today's Date: 01/10/2018 SLP Individual Time: 1500-1530 SLP Individual Time Calculation (min): 30 min  Short Term Goals: Week 1: SLP Short Term Goal 1 (Week 1): Pt will communicate wants and needs via multimodal communication in 5 out of 10 opportunties with Max A cues.  SLP Short Term Goal 2 (Week 1): Pt will produce voicing in 25% of opportunties with Max A cues.  SLP Short Term Goal 3 (Week 1): Pt will sustain attention to functional task for ~ 5 minutes with Max A cues.  SLP Short Term Goal 4 (Week 1): Pt will complete basic familiar tasks related to ADLs with Max A cues.  SLP Short Term Goal 5 (Week 1): Pt will consume trials of dysphagia 2 with minimal overt s/s of dysphagia and Max A cues to demonstrate readiness for diet upgrade.   Skilled Therapeutic Interventions:Skilled ST services focused on swallow and cognitive skills. SLP facilitated PO consumption trials of dys 2, pt demonstrated moderate oral residue on right side,  1 overt s/s aspiration immediate cough and pt required max A cues for swallow stratgeies. SLP facilitated multimodal communication of wants/needs during functional task, pt required max A cues in 2 out 10 opportunities, facial expression only no gestures. SLP facilitated vocalization utilizing automatic verbal sequencing, pt was unable to sing in unison "happy birthday" song. Pt did attempt vocalizes in 3 out 10 opportunities and produced /j/ in response to name. Pt was not stimuable to /m/ and /b/ sounds.  Pt was unable to response to yes/no question in gestures pertaining to biographical information and immediate environment. Pt was left in room with call bell within reach and bed alarm set. SLP reccomends to continue skilled services.     Pain Pain Assessment Pain Score: 0-No pain  Therapy/Group: Individual Therapy  Ahmya Bernick   [facility] 01/10/2018, 3:34 PM

## 2018-01-10 NOTE — Progress Notes (Signed)
Patient ID: Tammie Miles, female   DOB: 26-Mar-1975, 42 y.o.   MRN: [id number]   Tammie Miles is a 42 y.o. female who is admitted for CIR fall and a large left MCA infarct  Past Medical History:  Diagnosis Date  . ADHD   . Anxiety   . Depression   . Hypertension      Subjective: No new complaints. No new problems.  A no work where the prescriptions we have listed as her husband are to take her home with her son they are expecting to take her home right  Regular but most days she has been on that phasic.  Family at bedside  Objective: Vital signs in last 24 hours: Temp:  [98.3 F (36.8 C)-98.6 F (37 C)] 98.3 F (36.8 C) (11/02 0415) Pulse Rate:  [86-95] 86 (11/02 0415) Resp:  [15-20] 17 (11/02 0415) BP: (120-133)/(77-93) 131/86 (11/02 0415) SpO2:  [94 %-100 %] 94 % (11/02 0415) Weight:  [67.3 kg] 67.3 kg (11/01 1018) Weight change:  Last BM Date: 01/09/18  Intake/Output from previous day: 11/01 0701 - 11/02 0700 In: 720 [P.O.:720] Out: -   Patient Vitals for the past 24 hrs:  BP Temp Temp src Pulse Resp SpO2 Height Weight  01/10/18 0415 131/86 98.3 F (36.8 C) Oral 86 17 94 % - -  01/10/18 0322 - - - 90 20 97 % - -  01/10/18 0004 133/90 98.3 F (36.8 C) Oral 93 20 97 % - -  01/09/18 2015 (!) 120/93 98.3 F (36.8 C) - 95 16 99 % - -  01/09/18 1859 131/90 98.5 F (36.9 C) - 95 - 100 % - -  01/09/18 1423 123/77 98.6 F (37 C) Oral 86 15 99 % - -  01/09/18 1018 - - - - - - 5\' 1"  (1.549 m) 67.3 kg     Physical Exam General: No apparent distress aphasic HEENT: not dry healing tracheostomy site Lungs: Normal effort. Lungs clear to auscultation, no crackles or wheezes. Cardiovascular: Regular rate and rhythm, no edema Abdomen: S/NT/ND; BS(+) Musculoskeletal:  unchanged Neurological: No new neurological deficits; aphasic with right hemiparesis Wounds: Healing trach site  Mental state: Alert, oriented, cooperative    Lab Results: BMET    Component Value  Date/Time   NA 139 01/09/2018 0517   K 4.0 01/09/2018 0517   CL 107 01/09/2018 0517   CO2 23 01/09/2018 0517   GLUCOSE 90 01/09/2018 0517   BUN 12 01/09/2018 0517   CREATININE 0.82 01/09/2018 0517   CALCIUM 10.0 01/09/2018 0517   GFRNONAA >60 01/09/2018 0517   GFRAA >60 01/09/2018 0517   CBC    Component Value Date/Time   WBC 6.7 01/09/2018 0517   RBC 4.26 01/09/2018 0517   HGB 10.7 (L) 01/09/2018 0517   HCT 36.3 01/09/2018 0517   PLT 293 01/09/2018 0517   MCV 85.2 01/09/2018 0517   MCH 25.1 (L) 01/09/2018 0517   MCHC 29.5 (L) 01/09/2018 0517   RDW 20.3 (H) 01/09/2018 0517   LYMPHSABS 2.5 01/09/2018 0517   MONOABS 0.6 01/09/2018 0517   EOSABS 0.4 01/09/2018 0517   BASOSABS 0.1 01/09/2018 0517     Medications: I have reviewed the patient's current medications.  Assessment/Plan:  Status post large left MCA infarct with functional deficits.  Continue CIR.  Status post craniotomy on September 20 for hematoma evacuation DVT prophylaxis.  Continue heparin Seizure prophylaxis.  Continue Keppra Essential hypertension.  Controlled.  Continue present regimen  Length of stay, days: 2  Gordy Savers , MD 01/10/2018, 10:12 AM

## 2018-01-10 NOTE — Progress Notes (Signed)
Left a message again tonight at around 800 pm to Tammie Miles regarding fall from 11/1. Awaiting call back.

## 2018-01-10 NOTE — Progress Notes (Signed)
RN has not received any calls from Turner Daniels at this time.Will follow up during the day and inform incoming RN.

## 2018-01-11 DIAGNOSIS — I612 Nontraumatic intracerebral hemorrhage in hemisphere, unspecified: Secondary | ICD-10-CM

## 2018-01-11 NOTE — Progress Notes (Signed)
Patient ID: Tammie Miles, female   DOB: 08/08/1975, 42 y.o.   MRN: [id number]   Tammie Miles is a 42 y.o. female who is admitted for CIR with functional deficits following a large left MCA infarction  Past Medical History:  Diagnosis Date  . ADHD   . Anxiety   . Depression   . Hypertension      Subjective: No new complaints. No new problems.  Aphasic.  Family at bedside  Objective: Vital signs in last 24 hours: Temp:  [98.3 F (36.8 C)-99.1 F (37.3 C)] 98.3 F (36.8 C) (11/03 0450) Pulse Rate:  [89-92] 90 (11/03 0450) Resp:  [16-17] 16 (11/03 0450) BP: (131-142)/(85-99) 139/85 (11/03 0450) SpO2:  [96 %-100 %] 96 % (11/03 0450) Weight change:  Last BM Date: 01/09/18  Intake/Output from previous day: 11/02 0701 - 11/03 0700 In: 500 [P.O.:500] Out: -    Physical Exam General: No apparent distress alert.  Good eye contact HEENT: Moist.  Healing tracheostomy site Lungs: Normal effort. Lungs clear to auscultation, no crackles or wheezes. Cardiovascular: Regular rate and rhythm, no edema Abdomen: S/NT/ND; BS(+) Musculoskeletal:  unchanged Neurological: No new neurological deficits; aphasic with dense right hemiparesis Wounds: Healing tracheostomy site; healing surgical scar left scalp Extremities.  No edema Mental state: Alert, oriented, cooperative  Patient Vitals for the past 24 hrs:  BP Temp Temp src Pulse Resp SpO2  01/11/18 0450 139/85 98.3 F (36.8 C) Oral 90 16 96 %  01/10/18 2000 (!) 131/96 99 F (37.2 C) Oral 91 16 100 %  01/10/18 1749 140/88 - - 89 16 -  01/10/18 1731 (!) 142/99 99.1 F (37.3 C) Oral 92 17 98 %     Lab Results: BMET    Component Value Date/Time   NA 139 01/09/2018 0517   K 4.0 01/09/2018 0517   CL 107 01/09/2018 0517   CO2 23 01/09/2018 0517   GLUCOSE 90 01/09/2018 0517   BUN 12 01/09/2018 0517   CREATININE 0.82 01/09/2018 0517   CALCIUM 10.0 01/09/2018 0517   GFRNONAA >60 01/09/2018 0517   GFRAA >60 01/09/2018 0517    CBC    Component Value Date/Time   WBC 6.7 01/09/2018 0517   RBC 4.26 01/09/2018 0517   HGB 10.7 (L) 01/09/2018 0517   HCT 36.3 01/09/2018 0517   PLT 293 01/09/2018 0517   MCV 85.2 01/09/2018 0517   MCH 25.1 (L) 01/09/2018 0517   MCHC 29.5 (L) 01/09/2018 0517   RDW 20.3 (H) 01/09/2018 0517   LYMPHSABS 2.5 01/09/2018 0517   MONOABS 0.6 01/09/2018 0517   EOSABS 0.4 01/09/2018 0517   BASOSABS 0.1 01/09/2018 0517    Medications: I have reviewed the patient's current medications.  Assessment/Plan:  Functional deficits following a large left MCA infarction Status post craniotomy November 28, 2017 for hematoma evacuation Essential hypertension controlled DVT prophylaxis.  Continue heparin Seizure prophylaxis continue Keppra    Length of stay, days: 3  Gordy Savers , MD 01/11/2018, 9:09 AM

## 2018-01-11 NOTE — Progress Notes (Signed)
Patient ID: Tammie Miles, female   DOB: 1976/01/17, 42 y.o.   MRN: [id number]   Tammie Miles is a 42 y.o. female who is admitted for CIR with functional deficits following a left MCA infarction  Past Medical History:  Diagnosis Date  . ADHD   . Anxiety   . Depression   . Hypertension      Subjective: No new complaints. No new problems.  Aphasic.  Family at bedside  Objective: Vital signs in last 24 hours: Temp:  [98.3 F (36.8 C)-99.1 F (37.3 C)] 98.3 F (36.8 C) (11/03 0450) Pulse Rate:  [89-92] 90 (11/03 0450) Resp:  [16-17] 16 (11/03 0450) BP: (131-142)/(85-99) 139/85 (11/03 0450) SpO2:  [96 %-100 %] 96 % (11/03 0450) Weight change:  Last BM Date: 01/09/18  Intake/Output from previous day: 11/02 0701 - 11/03 0700 In: 500 [P.O.:500] Out: -   Patient Vitals for the past 24 hrs:  BP Temp Temp src Pulse Resp SpO2  01/11/18 0450 139/85 98.3 F (36.8 C) Oral 90 16 96 %  01/10/18 2000 (!) 131/96 99 F (37.2 C) Oral 91 16 100 %  01/10/18 1749 140/88 - - 89 16 -  01/10/18 1731 (!) 142/99 99.1 F (37.3 C) Oral 92 17 98 %    Physical Exam General: No apparent distress;  aphasic HEENT: Healing tracheostomy site Lungs: Normal effort. Lungs clear to auscultation, no crackles or wheezes. Cardiovascular: Regular rate and rhythm, no edema Abdomen: S/NT/ND; BS(+) Musculoskeletal:  unchanged Neurological: No new neurological deficits; aphasic with dense right hemiparesis Wounds: Healing trach site Extremities.  No edema Mental state: Alert, oriented, cooperative    Lab Results: BMET    Component Value Date/Time   NA 139 01/09/2018 0517   K 4.0 01/09/2018 0517   CL 107 01/09/2018 0517   CO2 23 01/09/2018 0517   GLUCOSE 90 01/09/2018 0517   BUN 12 01/09/2018 0517   CREATININE 0.82 01/09/2018 0517   CALCIUM 10.0 01/09/2018 0517   GFRNONAA >60 01/09/2018 0517   GFRAA >60 01/09/2018 0517   CBC    Component Value Date/Time   WBC 6.7 01/09/2018 0517   RBC 4.26  01/09/2018 0517   HGB 10.7 (L) 01/09/2018 0517   HCT 36.3 01/09/2018 0517   PLT 293 01/09/2018 0517   MCV 85.2 01/09/2018 0517   MCH 25.1 (L) 01/09/2018 0517   MCHC 29.5 (L) 01/09/2018 0517   RDW 20.3 (H) 01/09/2018 0517   LYMPHSABS 2.5 01/09/2018 0517   MONOABS 0.6 01/09/2018 0517   EOSABS 0.4 01/09/2018 0517   BASOSABS 0.1 01/09/2018 0517    Medications: I have reviewed the patient's current medications.  Assessment/Plan:  Functional deficits following a large left MCA infarction.  Continue CIR Status post craniotomy on September 20 for hematoma evacuation Essential hypertension controlled DVT prophylaxis continue heparin Seizure prophylaxis continue Keppra    Length of stay, days: 3  Gordy Savers , MD 01/11/2018, 9:15 AM

## 2018-01-12 ENCOUNTER — Encounter (HOSPITAL_COMMUNITY): Payer: Self-pay

## 2018-01-12 ENCOUNTER — Other Ambulatory Visit: Payer: Self-pay

## 2018-01-12 ENCOUNTER — Inpatient Hospital Stay (HOSPITAL_COMMUNITY): Payer: Self-pay | Admitting: Physical Therapy

## 2018-01-12 ENCOUNTER — Inpatient Hospital Stay (HOSPITAL_COMMUNITY): Payer: Self-pay | Admitting: Occupational Therapy

## 2018-01-12 ENCOUNTER — Inpatient Hospital Stay (HOSPITAL_COMMUNITY): Payer: Self-pay | Admitting: Speech Pathology

## 2018-01-12 MED ORDER — JEVITY 1.2 CAL PO LIQD
360.0000 mL | Freq: Three times a day (TID) | ORAL | Status: DC
  Administered 2018-01-12 – 2018-01-14 (×6): 360 mL
  Filled 2018-01-12 (×5): qty 474

## 2018-01-12 NOTE — Progress Notes (Signed)
[facility] PHYSICAL MEDICINE & REHABILITATION PROGRESS NOTE   Subjective/Complaints: Up in bed. No new complaints.   ROS: limited due to language/communication    Objective:   No results found. No results for input(s): WBC, HGB, HCT, PLT in the last 72 hours. No results for input(s): NA, K, CL, CO2, GLUCOSE, BUN, CREATININE, CALCIUM in the last 72 hours.  Intake/Output Summary (Last 24 hours) at 01/12/2018 1259 Last data filed at 01/12/2018 1100 Gross per 24 hour  Intake 615 ml  Output -  Net 615 ml     Physical Exam: Vital Signs Blood pressure 126/88, pulse 89, temperature 98.2 F (36.8 C), temperature source Oral, resp. rate 16, height 5\' 1"  (1.549 m), weight 67.3 kg, SpO2 96 %. Constitutional: No distress . Vital signs reviewed. HEENT: EOMI, oral membranes moist Neck: supple, trach stoma covered Cardiovascular: RRR without murmur. No JVD    Respiratory: CTA Bilaterally without wheezes or rales. Normal effort    GI: BS +, non-tender, non-distended, PEG Neurological: Patient makes good eye contact. globally aphasic.She would not follow commands.occasionally will phonate Motor strength is 0/5 RUE and RLE.  LUE and LLE grossly 4+ to 5/5   Sensation winces to pinch on left side as well as in right lower extremity, no reaction to pinch right upper extremity 3-4 beats of clonus Right ankle, DTR's 3+ on right Patient is able to follow gestures but not verbal commands. Attentive/alert    Assessment/Plan: 1. Functional deficits secondary to large left MCA infarct which require 3+ hours per day of interdisciplinary therapy in a comprehensive inpatient rehab setting.  Physiatrist is providing close team supervision and 24 hour management of active medical problems listed below.  Physiatrist and rehab team continue to assess barriers to discharge/monitor patient progress toward functional and medical goals  Care Tool:  Bathing    Body parts bathed by patient: Chest,  Abdomen, Front perineal area, Buttocks, Right upper leg, Left upper leg, Right lower leg, Left lower leg, Face   Body parts bathed by helper: Right arm, Left arm     Bathing assist Assist Level: Minimal Assistance - Patient > 75%     Upper Body Dressing/Undressing Upper body dressing   What is the patient wearing?: Pull over shirt    Upper body assist Assist Level: Moderate Assistance - Patient 50 - 74%    Lower Body Dressing/Undressing Lower body dressing      What is the patient wearing?: Incontinence brief, Pants     Lower body assist Assist for lower body dressing: Moderate Assistance - Patient 50 - 74%     Toileting Toileting    Toileting assist Assist for toileting: Moderate Assistance - Patient 50 - 74%(for balance, able to complete hygiene + clothing mgt herself)     Transfers Chair/bed transfer  Transfers assist     Chair/bed transfer assist level: Moderate Assistance - Patient 50 - 74%     Locomotion Ambulation   Ambulation assist      Assist level: 2 helpers Assistive device: Other (comment)(RW and R hand orthosis, w/c follow) Max distance: 50'   Walk 10 feet activity   Assist     Assist level: 2 helpers Assistive device: Walker-rolling(plus R hand orthosis, w/c follow)   Walk 50 feet activity   Assist    Assist level: 2 helpers Assistive device: Walker-rolling(plus R hand orthosis, w/c follow)    Walk 150 feet activity   Assist Walk 150 feet activity did not occur: Safety/medical concerns  Walk 10 feet on uneven surface  activity   Assist Walk 10 feet on uneven surfaces activity did not occur: Safety/medical concerns         Wheelchair     Assist Will patient use wheelchair at discharge?: Yes Type of Wheelchair: Manual    Wheelchair assist level: Minimal Assistance - Patient > 75% Max wheelchair distance: 71'    Wheelchair 50 feet with 2 turns activity    Assist    Wheelchair 50 feet with 2  turns activity did not occur: Safety/medical concerns       Wheelchair 150 feet activity     Assist Wheelchair 150 feet activity did not occur: Safety/medical concerns        Medical Problem List and Plan: 1. secondary to acute large left MCA infarction with large hemorrhagic transformation with uncal herniation status post hemicraniotomy 11/28/2017 for evacuation of hematoma and increased ICP due to left MCA occlusion  -Continue CIR therapies including PT, OT, and SLP  2. DVT Prophylaxis/Anticoagulation: Subcutaneous heparin 12/15/2017. Venous Doppler studies negative 3. Pain Management:Tylenol as needed 4. Mood/history of ZOX:WRUEAVW had been on Adderall prior to admission. Provide emotional support 5. Neuropsych: This patientis notyet capable of making decisions on herown behalf. 6. Skin/Wound Care:Routine skin checks 7. Fluids/Electrolytes/Nutrition:  supervision for initiation when eating most likely   -check lytes again Wednesday 8.Seizure prophylaxis. 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 Decannulated 11/1---occlusive dressing 10.Dysphagia. Dysphagia #1 thin liquid diet. PEG tube 12/08/2017.   -weaning off TF  -push PO 11.Hypertension. Norvasc 10 mg daily, labetalol 300 mg 3 times daily. Monitor with increased mobility 12. Acute blood loss anemia.   -hgb hovering around 10.7-11    LOS: 4 days A FACE TO FACE EVALUATION WAS PERFORMED  Ranelle Oyster 01/12/2018, 12:59 PM

## 2018-01-12 NOTE — Progress Notes (Signed)
Physical Therapy Session Note  Patient Details  Name: Tammie Miles MRN: [id number] Date of Birth: 1976-01-17  Today's Date: 01/12/2018 PT Individual Time: 1300-1410 PT Individual Time Calculation (min): 70 min   Short Term Goals: Week 1:  PT Short Term Goal 1 (Week 1): Pt will perform least restrictive transfers with min A consistently PT Short Term Goal 2 (Week 1): Pt will propel manual w/c x 50 ft with min A PT Short Term Goal 3 (Week 1): Pt will ambulate x 50 ft with assist x 1  Skilled Therapeutic Interventions/Progress Updates:    no indication of pain based on faces scale throughout session.  Focus on following commands and safety during functional mobility in addition to problem solving and visual scanning.    Pt transitions to EOB with supervision and increased time, PT dons pt's helmet total assist.  Stand/pivot throughout session with min assist and verbal cues for safety and attention to RLE.  Sit<>stand throughout session with min assist, with repetition pt indep recalls needing to place/secure RUE to R hand orthosis on RW.  Gait training x100' with RW and overall min assist, cues for forward gaze, increased BOS, and attention to RLE during turns.  Gait x10' focus on turns and ambulatory approach to sit with min assist to gait and mod for safety with turn/sit.  PT adjusted w/c to hemi-height for improved independence with w/c mobility.  W/C mobility x50' with overall min assist and max multimodal cues for sequencing L hemi-technique.  Pt would likely benefit from ultra-hemi height for continued improvement in independence with L hemi w/c propulsion technique.  Problem solving with graded peg board activity.  Pt able to re-create pattern created by PT (green-yellow) when presented with an option of 2 or 3 pieces with supervision.  Pt able to re-create pattern created by PT (red-blue) and able to choose correct colors from bin with supervision, towards the end of pattern requires increased  cues (up to mod) to orient peg correctly.  Finally, pt able to re-create pattern from picture (red-blue), again able to select colors correctly, but requires initially min cues for peg orientation, progress to supervision with increased time to recognize and self correct errors.  Pt returned to room at end of session and positioned back to bed with min assist to lift RLE into bed.  Call bell in reach and needs met.   Therapy Documentation Precautions:  Precautions Precautions: Fall Precaution Comments: trach site (decanulated 11/1), PEG, no bone flap Lt skull  Required Braces or Orthoses: Other Brace/Splint Other Brace/Splint: Helmet when OOB. Ok to doff while lying in bed  Restrictions Weight Bearing Restrictions: No    Therapy/Group: Individual Therapy  Michel Santee 01/12/2018, 2:12 PM

## 2018-01-12 NOTE — Progress Notes (Signed)
Occupational Therapy Session Note  Patient Details  Name: MERYL HUBERS MRN: [id number] Date of Birth: 1975-12-28  Today's Date: 01/12/2018 OT Individual Time: 4098-1191 OT Individual Time Calculation (min): 71 min   Short Term Goals: Week 1:  OT Short Term Goal 1 (Week 1): Pt will thread R LE into pants with supervision assist  OT Short Term Goal 2 (Week 1): Pt will require steady assist for standing balance during LB self care using LRAD OT Short Term Goal 3 (Week 1): Pt will don shirt with Min A   Skilled Therapeutic Interventions/Progress Updates:    Pt greeted in bed with RN staff setting up breakfast. She donned helmet with min A for fasten, and then completed stand pivot<w/c with Mod A. Pt ate breakfast with supervision. HOH for bilaterally integrating R UE for opening containers and for managing beverages. Bathing completed afterwards w/c level at sink, sit<stand. Pt required Min A overall for washing Lt side. HOH to functionally integrate R UE. She was able to position Rt limb for safety with mod cuing. Stand pivot<toilet completed with Mod A, and she had continent B+B void. Steady assist for dynamic sitting balance when she completed hygiene. Oral care and handwashing completed w/c level after, with [facility] for integrating R UE. Once back in w/c, pt dressed sit<stand at sink with Mod A for dynamic standing balance. Mod instructional cues required for orienting pants and fully pulling up pants on Rt side. She was able to lift R LE onto Lt knee to dress LB herself. At end of session she completed stand pivot<recliner with Mod A and was repositioned for comfort. Pt left with call bell in hand and safety belt fastened.   Pt verbalized "through" when done with breakfast, and also "sure" when asked if she wanted lotion on back today(!)  Therapy Documentation Precautions:  Precautions Precautions: Fall Precaution Comments: trach site (decanulated 11/1), PEG, no bone flap Lt skull  Required  Braces or Orthoses: Other Brace/Splint Other Brace/Splint: Helmet when OOB. Ok to doff while lying in bed  Restrictions Weight Bearing Restrictions: No Pain: No s/s pain during tx  Pain Assessment Pain Scale: Faces Pain Score: 0-No pain Faces Pain Scale: (No indication) ADL: ADL Eating: Not assessed Grooming: Not assessed Upper Body Bathing: Moderate assistance Where Assessed-Upper Body Bathing: Edge of bed Lower Body Bathing: Moderate assistance Where Assessed-Lower Body Bathing: Edge of bed Upper Body Dressing: Moderate assistance Where Assessed-Upper Body Dressing: Edge of bed Lower Body Dressing: Moderate assistance Toileting: Moderate assistance Where Assessed-Toileting: Teacher, adult education: Maximal Dentist Method: Surveyor, minerals: Acupuncturist: Not assessed     Therapy/Group: Individual Therapy  Phu Record A Maaliyah Adolph 01/12/2018, 12:17 PM

## 2018-01-12 NOTE — Care Management (Signed)
[facility] Individual Statement of Services  Patient Name:  EMELINE SIMPSON  Date:  01/12/2018  Welcome to the [facility].  Our goal is to provide you with an individualized program based on your diagnosis and situation, designed to meet your specific needs.  With this comprehensive rehabilitation program, you will be expected to participate in at least 3 hours of rehabilitation therapies Monday-Friday, with modified therapy programming on the weekends.  Your rehabilitation program will include the following services:  Physical Therapy (PT), Occupational Therapy (OT), Speech Therapy (ST), 24 hour per day rehabilitation nursing, Therapeutic Recreaction (TR), Neuropsychology, Case Management (Social Worker), Rehabilitation Medicine, Nutrition Services and Pharmacy Services  Weekly team conferences will be held on Tuesdays to discuss your progress.  Your Social Worker will talk with you frequently to get your input and to update you on team discussions.  Team conferences with you and your family in attendance may also be held.  Expected length of stay: 2-3 weeks   Overall anticipated outcome: supervision  Depending on your progress and recovery, your program may change. Your Social Worker will coordinate services and will keep you informed of any changes. Your Social Worker's name and contact numbers are listed  below.  The following services may also be recommended but are not provided by the [facility]:   Driving Evaluations  Home Health Rehabiltiation Services  Outpatient Rehabilitation Services  Vocational Rehabilitation  Arrangements will be made to provide these services after discharge if needed.  Arrangements include referral to agencies that provide these services.  Your insurance has been verified to be:  Medicaid app pending Your primary doctor is:  none  Pertinent information will be shared with your doctor and your  insurance company.  Social Worker:  [place], [place] [phone] or ([phone]   Information discussed with and copy given to patient by: Amada Jupiter, 01/12/2018, 4:29 PM

## 2018-01-12 NOTE — Progress Notes (Signed)
Speech Language Pathology Daily Session Note  Patient Details  Name: Tammie Miles MRN: [id number] Date of Birth: 17-Mar-1975  Today's Date: 01/12/2018 SLP Individual Time: 1000-1100 SLP Individual Time Calculation (min): 60 min  Short Term Goals: Week 1: SLP Short Term Goal 1 (Week 1): Pt will communicate wants and needs via multimodal communication in 5 out of 10 opportunties with Max A cues.  SLP Short Term Goal 2 (Week 1): Pt will produce voicing in 25% of opportunties with Max A cues.  SLP Short Term Goal 3 (Week 1): Pt will sustain attention to functional task for ~ 5 minutes with Max A cues.  SLP Short Term Goal 4 (Week 1): Pt will complete basic familiar tasks related to ADLs with Max A cues.  SLP Short Term Goal 5 (Week 1): Pt will consume trials of dysphagia 2 with minimal overt s/s of dysphagia and Max A cues to demonstrate readiness for diet upgrade.   Skilled Therapeutic Interventions:  Skilled treatment session focused on dysphagia and cognition goals. SLP facilitated session by providing opportunity for pt to self-feed. With Mod A cues pt able ot feed herself dysphagia 2 snack with Mod A oral residue. Pt with some increased vocalizations during session  - approximation of "J" in "[place]jar" and "ahuh" when asked about snack. With Max A pt able to sort basic colors between 2 groups. Pt with increased sustained attention to tasks with rest breaks given and music played at 10 minute intervals. Pt left upright in bed, bed alarm on and all needs within reach. Continue per current plan of care.      Pain Pain Assessment Pain Scale: Faces Pain Score: 0-No pain Faces Pain Scale: (No indication)  Therapy/Group: Individual Therapy  Nicha Hemann 01/12/2018, 12:04 PM

## 2018-01-12 NOTE — Progress Notes (Signed)
Nutrition Follow-up  INTERVENTION:   - Allow pt to eat from meal trays. If PO intake at meal is < 50%, administer a bolus tube feeding using Jevity 1.2 formula via PEG at volume of 360 ml.  - Continue free water flushes of 150 ml q 4 hours per tube (MD to adjust as appropriate).  - If pt to receive full bolus tube feeds and free water flushes, it will provide1296 kcal, 60 grams of protein, and 1775 ml water daily (76% minimum estimated kcal needs, 71% minimum estimated protein needs).  - Continue Ensure Enlive po TID, each supplement provides 350 kcal and 20 grams of protein  - Continue liquid MVI with minerals daily  NUTRITION DIAGNOSIS:   Inadequate oral intake related to dysphagia as evidenced by other (per RN report, meal completion 25-75%).  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   Significant Events: 9/19 - intubated 9/20 - decompressive left craniectomy 10/1 - trach, PEG placed 10/4 - transitioned to [facility] 10/23 - diet advanced to Dysphagia 1 10/31 - discharged to CIR 11/1 - trach removed  Per discussion with RN, all medications are being administered via PEG. Per RN, pt is typically accepting Ensure Enlive supplements when offered by nursing staff. Per MAR, 6 out of last 9 Ensure Enlive oral nutrition supplements were accepted by pt.  Unfinished vanilla Ensure Enlive at bedside at time of visit. Pt took a sip during RD exam. Suspect pt is not completing of 100% of all Ensure Enlive oral nutrition supplements that she is  accepting from RN. RN suspects pt prefers chocolate flavor as pt tends to consume these more readily than other flavors.  Spoke with NT who reports pt completed 25% of breakfast meal (half of pancakes) which is approximately 50 kcal and 4 grams of protein.  If poor PO intake continues, pt is at risk for protein-calorie malnutrition.  Spoke with PA regarding ordering tube feedings. PA agrees with plan for bolus administration of tube feeds ONLY IF pt completes < 50% of meal. Order placed as detailed above.  Meal Completion: 25-75% x last 8 meals  Medications reviewed and include: Pepcid 20 mg BID, Ensure Enlive TID, ferrous sulfate 325 mg BID, folic acid 1 mg daily, free water 150 ml via PEG q 4 hours (900 ml daily), liquid MVI daily, psyllium BID, thiamine 100 mg daily  Labs reviewed.  NUTRITION - FOCUSED PHYSICAL EXAM:    Most Recent Value  Orbital Region  No depletion  Upper Arm Region  Mild depletion  Thoracic and Lumbar Region  No depletion  Buccal Region  No depletion  Temple Region  No depletion  Clavicle Bone Region  No depletion  Clavicle and Acromion Bone Region  Mild depletion  Scapular Bone Region  Unable to assess  Dorsal Hand  No depletion  Patellar Region  Mild depletion  Anterior Thigh Region  Mild depletion  Posterior Calf Region  Mild depletion  Edema (RD Assessment)  None  Hair  Reviewed  Eyes  Reviewed  Mouth  Unable to assess  Skin  Reviewed  Nails  Reviewed       Diet Order:    Diet Order            DIET - DYS 1 Room service appropriate? Yes; Fluid consistency: Thin  Diet effective now              EDUCATION NEEDS:   Not appropriate for education at this time  Skin:  Skin Assessment: Skin Integrity Issues: Stage II: coccyx Incisions: L head, abdomen  Last BM:  11/3 (large type 1)  Height:   Ht Readings from Last 1 Encounters:  01/09/18 5\' 1"  (1.549 m)    Weight:   Wt Readings from Last 1 Encounters:  01/09/18 67.3 kg     Ideal Body Weight:  47.7 kg  BMI:  Body mass index is 28.03 kg/m.  Estimated Nutritional Needs:   Kcal:  1700-1900  Protein:  85-100 grams  Fluid:  1.7-1.9 L    Earma Reading, MS, RD, LDN Inpatient Clinical Dietitian Pager: [phone] Weekend/After Hours: [phone]

## 2018-01-13 ENCOUNTER — Inpatient Hospital Stay (HOSPITAL_COMMUNITY): Payer: Self-pay | Admitting: Physical Therapy

## 2018-01-13 ENCOUNTER — Inpatient Hospital Stay (HOSPITAL_COMMUNITY): Payer: Self-pay | Admitting: Occupational Therapy

## 2018-01-13 ENCOUNTER — Inpatient Hospital Stay (HOSPITAL_COMMUNITY): Payer: Self-pay

## 2018-01-13 NOTE — Progress Notes (Signed)
Occupational Therapy Session Note  Patient Details  Name: Tammie Miles MRN: [id number] Date of Birth: 1976-02-01  Today's Date: 01/13/2018 OT Individual Time: 1430-1500 OT Individual Time Calculation (min): 30 min    Short Term Goals: Week 1:  OT Short Term Goal 1 (Week 1): Pt will thread R LE into pants with supervision assist  OT Short Term Goal 2 (Week 1): Pt will require steady assist for standing balance during LB self care using LRAD OT Short Term Goal 3 (Week 1): Pt will don shirt with Min A   Skilled Therapeutic Interventions/Progress Updates:    Upon entering the room, pt sleeping soundly in bed. She was agreeable to OT intervention with increased time and encouragement. Pt performed supine >sit with mod A to EOB. Pt standing with min A and requiring mod verbal cuing to place R UE in orthosis on RW. Pt ambulating with RW 10' into bathroom with min A for balance. Pt with mod standing balance while therapist performed clothing management. Pt able to perform hygiene while seated on toilet for safety. Pt needing hand over hand assistance to wash B hands at sink. Pt returning to bed with bedrails up and bed alarm activated for safety. Call bell within reach. Pt demonstrated hitting call buttons when asked.   Therapy Documentation Precautions:  Precautions Precautions: Fall Precaution Comments: trach site (decanulated 11/1), PEG, no bone flap Lt skull  Required Braces or Orthoses: Other Brace/Splint Other Brace/Splint: Helmet when OOB. Ok to [employer] while lying in bed  Restrictions Weight Bearing Restrictions: No General:   Vital Signs: Therapy Vitals Temp: 98.5 F (36.9 C) Pulse Rate: 96 BP: 135/88 Patient Position (if appropriate): Lying Oxygen Therapy SpO2: 98 % O2 Device: Room Air Pain: Pain Assessment Pain Score: 0-No pain ADL: ADL Eating: Not assessed Grooming: Not assessed Upper Body Bathing: Moderate assistance Where Assessed-Upper Body Bathing: Edge of  bed Lower Body Bathing: Moderate assistance Where Assessed-Lower Body Bathing: Edge of bed Upper Body Dressing: Moderate assistance Where Assessed-Upper Body Dressing: Edge of bed Lower Body Dressing: Moderate assistance Toileting: Moderate assistance Where Assessed-Toileting: Teacher, adult education: Maximal Dentist Method: Surveyor, minerals: Acupuncturist: Not assessed   Therapy/Group: Individual Therapy  Alen Bleacher 01/13/2018, 4:36 PM

## 2018-01-13 NOTE — Progress Notes (Signed)
Physical Therapy Session Note  Patient Details  Name: Tammie Miles MRN: [id number] Date of Birth: 10-10-1975  Today's Date: 01/13/2018 PT Individual Time: 5056-9794 PT Individual Time Calculation (min): 42 min   Short Term Goals: Week 1:  PT Short Term Goal 1 (Week 1): Pt will perform least restrictive transfers with min A consistently PT Short Term Goal 2 (Week 1): Pt will propel manual w/c x 50 ft with min A PT Short Term Goal 3 (Week 1): Pt will ambulate x 50 ft with assist x 1  Skilled Therapeutic Interventions/Progress Updates:   Pt in supine upon arrival, no evidence of pain throughout session. Pt following 1-step commands ~90% of time throughout session but remained non-verbal throughout, appeared agreeable to therapy. Transferred to EOB and to w/c via stand pivot w/ supervision. Removed helmet while seated in w/c to wash face, change gown, and to briefly wash upper and lower body from a seated level. Set-up assist w/ wash cloth and min assist to doff and don new gown. Total assist w/c transport to/from therapy gym for time management. Ambulated 100' x3 w/ CGA overall, occasional min assist for RW management. Pt initiated putting RUE on orthosis on RW w/o prompting from therapist and followed verbal commands to increase BOS and step length while ambulating. Ended session in care of RN at nurses station w/ quick release belt donned, all needs met.   Therapy Documentation Precautions:  Precautions Precautions: Fall Precaution Comments: trach site (decanulated 11/1), PEG, no bone flap Lt skull  Required Braces or Orthoses: Other Brace/Splint Other Brace/Splint: Helmet when OOB. Ok to doff while lying in bed  Restrictions Weight Bearing Restrictions: No Vital Signs: Therapy Vitals Temp: 98.1 F (36.7 C) Temp Source: Oral Pulse Rate: 92 Resp: 17 BP: (!) 146/94 Patient Position (if appropriate): Lying Oxygen Therapy SpO2: 97 % O2 Device: Room Air  Therapy/Group: Individual  Therapy  Julena Barbour K Paxtyn Wisdom 01/13/2018, 9:15 AM

## 2018-01-13 NOTE — Progress Notes (Signed)
[place] PHYSICAL MEDICINE & REHABILITATION PROGRESS NOTE   Subjective/Complaints: No new issues reported  ROS: limited due to language/communication   Objective:   No results found. No results for input(s): WBC, HGB, HCT, PLT in the last 72 hours. No results for input(s): NA, K, CL, CO2, GLUCOSE, BUN, CREATININE, CALCIUM in the last 72 hours.  Intake/Output Summary (Last 24 hours) at 01/13/2018 0909 Last data filed at 01/12/2018 1700 Gross per 24 hour  Intake 255 ml  Output -  Net 255 ml     Physical Exam: Vital Signs Blood pressure (!) 146/94, pulse 92, temperature 98.1 F (36.7 C), temperature source Oral, resp. rate 17, height 5\' 1"  (1.549 m), weight 67.3 kg, SpO2 97 %. Constitutional: No distress . Vital signs reviewed. HEENT: EOMI, oral membranes moist Neck: supple, stoma closing nicely with granulation Cardiovascular: RRR without murmur. No JVD    Respiratory: CTA Bilaterally without wheezes or rales. Normal effort    GI: BS +, non-tender, non-distended  Neurological:remains alert Aphasic. Makes facial expressions with verbal cueing. Limited initiation. Motor strength is 0/5 RUE and RLE.  LUE and LLE grossly 4+ to 5/5   Sensation winces to pinch on left side as well as in right lower extremity, no reaction to pinch right upper extremity---no change 3-4 beats of clonus Right ankle, DTR's 3+ on right      Assessment/Plan: 1. Functional deficits secondary to large left MCA infarct which require 3+ hours per day of interdisciplinary therapy in a comprehensive inpatient rehab setting.  Physiatrist is providing close team supervision and 24 hour management of active medical problems listed below.  Physiatrist and rehab team continue to assess barriers to discharge/monitor patient progress toward functional and medical goals  Care Tool:  Bathing    Body parts bathed by patient: Chest, Abdomen, Front perineal area, Buttocks, Right upper leg, Left upper leg, Right  lower leg, Left lower leg, Face   Body parts bathed by helper: Right arm, Left arm     Bathing assist Assist Level: Minimal Assistance - Patient > 75%     Upper Body Dressing/Undressing Upper body dressing   What is the patient wearing?: Pull over shirt    Upper body assist Assist Level: Moderate Assistance - Patient 50 - 74%    Lower Body Dressing/Undressing Lower body dressing      What is the patient wearing?: Incontinence brief, Pants     Lower body assist Assist for lower body dressing: Moderate Assistance - Patient 50 - 74%     Toileting Toileting    Toileting assist Assist for toileting: Moderate Assistance - Patient 50 - 74%(for balance, able to complete hygiene + clothing mgt herself)     Transfers Chair/bed transfer  Transfers assist     Chair/bed transfer assist level: Minimal Assistance - Patient > 75%     Locomotion Ambulation   Ambulation assist      Assist level: Minimal Assistance - Patient > 75% Assistive device: Orthosis Max distance: 100   Walk 10 feet activity   Assist     Assist level: Minimal Assistance - Patient > 75% Assistive device: Walker-rolling, Orthosis   Walk 50 feet activity   Assist    Assist level: Minimal Assistance - Patient > 75% Assistive device: Walker-rolling, Orthosis    Walk 150 feet activity   Assist Walk 150 feet activity did not occur: Safety/medical concerns         Walk 10 feet on uneven surface  activity   Assist  Walk 10 feet on uneven surfaces activity did not occur: Safety/medical concerns         Wheelchair     Assist Will patient use wheelchair at discharge?: Yes Type of Wheelchair: Manual    Wheelchair assist level: Minimal Assistance - Patient > 75% Max wheelchair distance: 50    Wheelchair 50 feet with 2 turns activity    Assist    Wheelchair 50 feet with 2 turns activity did not occur: Safety/medical concerns   Assist Level: Minimal Assistance -  Patient > 75%   Wheelchair 150 feet activity     Assist Wheelchair 150 feet activity did not occur: Safety/medical concerns        Medical Problem List and Plan: 1. secondary to acute large left MCA infarction with large hemorrhagic transformation with uncal herniation status post hemicraniotomy 11/28/2017 for evacuation of hematoma and increased ICP due to left MCA occlusion  -team conference today 2. DVT Prophylaxis/Anticoagulation: Subcutaneous heparin 12/15/2017. Venous Doppler studies negative 3. Pain Management:Tylenol as needed 4. Mood/history of ZOX:WRUEAVW had been on Adderall prior to admission. Provide emotional support 5. Neuropsych: This patientis notyet capable of making decisions on herown behalf. 6. Skin/Wound Care:Routine skin checks 7. Fluids/Electrolytes/Nutrition:  supervision for initiation when eating most likely   -check lytes again Wednesday 8.Seizure prophylaxis. 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 Decannulated 11/1---occlusive dressing--area closing 10.Dysphagia. Dysphagia #1 thin liquid diet. PEG tube 12/08/2017.   -weaning off TF  -push PO (ate 50-70% yesterday) 11.Hypertension. Norvasc 10 mg daily, labetalol 300 mg 3 times daily. Monitor with increased mobility 12. Acute blood loss anemia.   -hgb hovering around 10.7-11    LOS: 5 days A FACE TO FACE EVALUATION WAS PERFORMED  Ranelle Oyster 01/13/2018, 9:09 AM

## 2018-01-13 NOTE — NC FL2 (Signed)
[place] LEVEL OF CARE SCREENING TOOL     IDENTIFICATION  Patient Name: Tammie Miles Birthdate: Sep 19, 1975 Sex: female Admission Date (Current Location): 01/08/2018  [facility] and [place] Number:  Herbalist and Address:  [facility] [place] [place] [address] [place], [place] [address]      Provider Number: [id number]  Attending Physician Name and Address:  Meredith Staggers, MD  Relative Name and Phone Number:  Earl Lagos; son; [phone]    Current Level of Care: Other (Comment)(Acute Inpatient Rehab) Recommended Level of Care: [place] Prior Approval Number:    Date Approved/Denied:   PASRR Number: [id number]  Discharge Plan: SNF    Current Diagnoses: Patient Active Problem List   Diagnosis Date Noted  . Tracheostomy in place [facility])   . Pressure injury of skin 12/29/2017  . Altered mental status   . Central venous catheter in place   . 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 Stroke (cerebrum) ([place]) 12/02/2017  . Anterior cerebral circulation hemorrhagic infarction ([place]) 12/02/2017  . Hypertensive emergency 12/02/2017  . Anemia 12/02/2017  . Leukocytosis 12/02/2017  . Acute intra-cranial hemorrhage ([place])   . Respiratory failure ([place])   . ICH (intracerebral hemorrhage) ([place]) 11/27/2017  . Intracranial hemorrhage ([place]) 11/27/2017    Orientation RESPIRATION BLADDER Height & Weight     Self  Normal Incontinent Weight: 67.3 kg Height:  5' 1" (154.9 cm)  BEHAVIORAL SYMPTOMS/MOOD NEUROLOGICAL BOWEL NUTRITION STATUS      Incontinent Diet, Feeding tube(no longer accessing peg;  diet = Dys 2)  AMBULATORY STATUS COMMUNICATION OF NEEDS Skin   Limited Assist Non-Verbally Surgical wounds                       Personal Care Assistance Level of Assistance  Bathing, Dressing, Feeding Bathing Assistance: Limited assistance Feeding assistance: Limited assistance Dressing Assistance: Limited  assistance Total Care Assistance: Limited assistance   Functional Limitations Info  Speech     Speech Info: Impaired    SPECIAL CARE FACTORS FREQUENCY  PT (By licensed PT), OT (By licensed OT), Speech therapy     PT Frequency: 5x/wk OT Frequency: 5x/wk     Speech Therapy Frequency: 5x/wk      Contractures Contractures Info: Not present    Additional Factors Info  Code Status, Allergies Code Status Info: Full Allergies Info: NKDA           Current Medications (01/13/2018):  This is the current hospital active medication list Current Facility-Administered Medications  Medication Dose Route Frequency Provider Last Rate Last Dose  . acetaminophen (TYLENOL) tablet 650 mg  650 mg Oral Q4H PRN Angiulli, Lavon Paganini, PA-C      . amLODipine (NORVASC) tablet 10 mg  10 mg Oral Daily AngiulliLavon Paganini, PA-C   10 mg at 01/13/18 0900  . aspirin chewable tablet 81 mg  81 mg Per Tube Daily Cathlyn Parsons, PA-C   81 mg at 01/13/18 9326  . chlorhexidine gluconate (MEDLINE KIT) (PERIDEX) 0.12 % solution 15 mL  15 mL Mouth Rinse BID Cathlyn Parsons, PA-C   15 mL at 01/13/18 0916  . famotidine (PEPCID) 40 MG/5ML suspension 20 mg  20 mg Per Tube BID AngiulliLavon Paganini, PA-C   20 mg at 01/13/18 0900  . feeding supplement (ENSURE ENLIVE) (ENSURE ENLIVE) liquid 237 mL  237 mL Oral TID BM Meredith Staggers, MD   237 mL at 01/13/18 1407  .  feeding supplement (JEVITY 1.2 CAL) liquid 360 mL  360 mL Per Tube TID Angiulli, Daniel J, PA-C   360 mL at 01/13/18 0958  . ferrous sulfate tablet 325 mg  325 mg Oral BID Angiulli, Daniel J, PA-C   325 mg at 01/13/18 0909  . folic acid (FOLVITE) tablet 1 mg  1 mg Per Tube Daily Angiulli, Daniel J, PA-C   1 mg at 01/13/18 0909  . free water 150 mL  150 mL Per Tube Q4H Swartz, Zachary T, MD   150 mL at 01/13/18 1241  . heparin injection 5,000 Units  5,000 Units Subcutaneous Q8H Angiulli, Daniel J, PA-C   5,000 Units at 01/13/18 1400  . labetalol (NORMODYNE)  tablet 300 mg  300 mg Oral TID Angiulli, Daniel J, PA-C   300 mg at 01/13/18 1404  . levETIRAcetam (KEPPRA) 100 MG/ML solution 1,000 mg  1,000 mg Per Tube Q12H Angiulli, Daniel J, PA-C   1,000 mg at 01/13/18 1359  . multivitamin liquid 15 mL  15 mL Per Tube Daily Angiulli, Daniel J, PA-C   15 mL at 01/13/18 0900  . psyllium (HYDROCIL/METAMUCIL) packet 1 packet  1 packet Oral BID Angiulli, Daniel J, PA-C   1 packet at 01/13/18 0909  . senna-docusate (Senokot-S) tablet 1 tablet  1 tablet Oral QHS PRN Angiulli, Daniel J, PA-C      . thiamine (VITAMIN B-1) tablet 100 mg  100 mg Per Tube Daily Angiulli, Daniel J, PA-C   100 mg at 01/13/18 0908     Discharge Medications: Please see discharge summary for a list of discharge medications.  Relevant Imaging Results:  Relevant Lab Results:   Additional Information SS#[id number]  HOYLE, LUCY, LCSW    

## 2018-01-13 NOTE — Plan of Care (Signed)
  Problem: Consults Goal: RH STROKE PATIENT EDUCATION Description See Patient Education module for education specifics  Outcome: Progressing   Problem: RH BOWEL ELIMINATION Goal: RH STG MANAGE BOWEL WITH ASSISTANCE Description STG Manage Bowel with mod Assistance.  Outcome: Progressing Goal: RH STG MANAGE BOWEL W/MEDICATION W/ASSISTANCE Description STG Manage Bowel with Medication with mod Assistance.  Outcome: Progressing   Problem: RH BLADDER ELIMINATION Goal: RH STG MANAGE BLADDER WITH ASSISTANCE Description STG Manage Bladder With mod. Assistance  Outcome: Progressing   Problem: RH SKIN INTEGRITY Goal: RH STG SKIN FREE OF INFECTION/BREAKDOWN Description With mod. Assistance.  Outcome: Progressing Goal: RH STG MAINTAIN SKIN INTEGRITY WITH ASSISTANCE Description STG Maintain Skin Integrity With min Assistance.  Outcome: Progressing Goal: RH STG ABLE TO PERFORM INCISION/WOUND CARE W/ASSISTANCE Description STG Able To Perform Incision/Wound Care With min.Assistance.  Outcome: Progressing   Problem: RH SAFETY Goal: RH STG ADHERE TO SAFETY PRECAUTIONS W/ASSISTANCE/DEVICE Description STG Adhere to Safety Precautions With mod. Assistance/Device.  Outcome: Progressing Goal: RH STG DECREASED RISK OF FALL WITH ASSISTANCE Description STG Decreased Risk of Fall With mod Assistance.  Outcome: Progressing   Problem: RH PAIN MANAGEMENT Goal: RH STG PAIN MANAGED AT OR BELOW PT'S PAIN GOAL Description < 2 on faces pain scale  Outcome: Progressing   Problem: RH KNOWLEDGE DEFICIT Goal: RH STG INCREASE KNOWLEDGE OF HYPERTENSION Description With mod. assistance  Outcome: Progressing Goal: RH STG INCREASE KNOWLEDGE OF DYSPHAGIA/FLUID INTAKE Description With mod. assistance  Outcome: Progressing Goal: RH STG INCREASE KNOWLEDGE OF STROKE PROPHYLAXIS Description With mod. Assistance.  Outcome: Progressing

## 2018-01-13 NOTE — Progress Notes (Signed)
Speech Language Pathology Daily Session Note  Patient Details  Name: Tammie Miles MRN: [id number] Date of Birth: [birth year]  Today's Date: 01/13/2018 SLP Individual Time: 0900-1000 SLP Individual Time Calculation (min): 60 min  Short Term Goals: Week 1: SLP Short Term Goal 1 (Week 1): Pt will communicate wants and needs via multimodal communication in 5 out of 10 opportunties with Max A cues.  SLP Short Term Goal 2 (Week 1): Pt will produce voicing in 25% of opportunties with Max A cues.  SLP Short Term Goal 3 (Week 1): Pt will sustain attention to functional task for ~ 5 minutes with Max A cues.  SLP Short Term Goal 4 (Week 1): Pt will complete basic familiar tasks related to ADLs with Max A cues.  SLP Short Term Goal 5 (Week 1): Pt will consume trials of dysphagia 2 with minimal overt s/s of dysphagia and Max A cues to demonstrate readiness for diet upgrade.   Skilled Therapeutic Interventions:Skilled ST services focused on swallow and cognitive skills. SLP facilitated PO consumption of dys 1 and thin via cup breakfast tray, pt required min-supervision A cues for swallow strategies. SLP facilitated PO consumption trials of dys 2 snack, pt required mod A verbal cues and demonstrated moderate right anterior spillage. SLP facilitated basic problem solving skills during brush teeth, pt required overall mod-max A verbal to swish/spilt and produced moderate blood tinged salvia, SLP notified nursing staff. SLP facilitated basic problem solving sequencing numbers, pt demonstrated ability to sequence #1-5 Mod I, 6-15 Min A verbal cues, 15-30 supervision A verbal cues and 1-30 mid A verbal cues. Pt attempted vocalizations /ju/ when asked name and appeared to be when indicating "yes" given a yes/no question in functional context and demonstrated "no" head nod x3 with max A multimodal cues. Pt was left in room with call bell within reach and chair alarm set. SLP reccomends to continue skilled services.      Pain Pain Assessment Pain Score: 0-No pain  Therapy/Group: Individual Therapy  Tenaya Hilyer  [facility] 01/13/2018, 1:12 PM

## 2018-01-13 NOTE — Progress Notes (Signed)
Physical Therapy Session Note  Patient Details  Name: Tammie Miles MRN: [id number] Date of Birth: 12/30/1975  Today's Date: 01/13/2018 PT Individual Time: 1300-1400 PT Individual Time Calculation (min): 60 min   Short Term Goals: Week 1:  PT Short Term Goal 1 (Week 1): Pt will perform least restrictive transfers with min A consistently PT Short Term Goal 2 (Week 1): Pt will propel manual w/c x 50 ft with min A PT Short Term Goal 3 (Week 1): Pt will ambulate x 50 ft with assist x 1  Skilled Therapeutic Interventions/Progress Updates:    no indication of pain based on faces scale.  Session focus on NMR for RUE/RLE with balance and functional mobility tasks.    Pt transitions to EOB with min assist for RUE.  Stand/pivot to R throughout session with min assist and cues for attention to R foot placement to reduce LOB.  Seated balance task focus on forward/lateral reaching for clothespins on floor, 3 trials to fatigue with therapist providing input to RUE for weight bearing.  Gait training 2x100' with RW and min assist, min cues for increased BOS with good carryover from previous sessions.  Stair negotiation x4 steps with L ascending rail and HHA on R, mod assist overall for sequencing and balance.  Pt able to ambulate back to room at end of session with RW and min assist.  Positioned back to bed with call bell in reach and needs met.   Therapy Documentation Precautions:  Precautions Precautions: Fall Precaution Comments: trach site (decanulated 11/1), PEG, no bone flap Lt skull  Required Braces or Orthoses: Other Brace/Splint Other Brace/Splint: Helmet when OOB. Ok to doff while lying in bed  Restrictions Weight Bearing Restrictions: No    Therapy/Group: Individual Therapy  Caitlin E Warren 01/13/2018, 1:38 PM  

## 2018-01-14 ENCOUNTER — Inpatient Hospital Stay (HOSPITAL_COMMUNITY): Payer: Self-pay

## 2018-01-14 ENCOUNTER — Inpatient Hospital Stay (HOSPITAL_COMMUNITY): Payer: Self-pay | Admitting: Occupational Therapy

## 2018-01-14 ENCOUNTER — Inpatient Hospital Stay (HOSPITAL_COMMUNITY): Payer: Self-pay | Admitting: Physical Therapy

## 2018-01-14 LAB — BASIC METABOLIC PANEL
Anion gap: 7 (ref 5–15)
BUN: 11 mg/dL (ref 6–20)
CO2: 26 mmol/L (ref 22–32)
CREATININE: 0.81 mg/dL (ref 0.44–1.00)
Calcium: 9.9 mg/dL (ref 8.9–10.3)
Chloride: 108 mmol/L (ref 98–111)
GFR calc non Af Amer: >60 mL/min (ref >60)
Glucose, Bld: 92 mg/dL (ref 70–99)
POTASSIUM: 4.1 mmol/L (ref 3.5–5.1)
Sodium: 141 mmol/L (ref 135–145)

## 2018-01-14 NOTE — Progress Notes (Signed)
Occupational Therapy Session Note  Patient Details  Name: Tammie Miles MRN: [id number] Date of Birth: Jun 02, 1975  Today's Date: 01/14/2018 OT Individual Time: 1400-1455 OT Individual Time Calculation (min): 55 min    Short Term Goals: Week 1:  OT Short Term Goal 1 (Week 1): Pt will thread R LE into pants with supervision assist  OT Short Term Goal 2 (Week 1): Pt will require steady assist for standing balance during LB self care using LRAD OT Short Term Goal 3 (Week 1): Pt will don shirt with Min A   Skilled Therapeutic Interventions/Progress Updates:    Upon entering the room, pt supine in bed with no c/o signs, symptoms, or pain. Pt agreeable to OT intervention. Supine >sit with min A to EOB. Pt ambulating with RW and min  A 10' into bathroom. Pt seated on toilet and able to void. OT requesting pt to shower this session and she began to doff clothing while seated on commode chair for safety. Pt transferred from toilet to TTB with mod hand held assistance. Pt remained seated on bench and did not attempt to stand. Hand over hand assistance to utilize R UE in functional task. Pt needing min verbal cuing for sequencing this session. Pt ambulating to sit on EOB to don hospital gown with mod cuing to dress R side. Pt returning to supine with bed rails up, mats placed on floor, and bed alarm activated. Call bell within reach upon exiting the room.   Therapy Documentation Precautions:  Precautions Precautions: Fall Precaution Comments: trach site (decanulated 11/1), PEG, no bone flap Lt skull  Required Braces or Orthoses: Other Brace/Splint Other Brace/Splint: Helmet when OOB. Ok to [employer] while lying in bed  Restrictions Weight Bearing Restrictions: No General:   Vital Signs: Therapy Vitals Temp: 98.5 F (36.9 C) Temp Source: Oral Pulse Rate: 89 Resp: 14 BP: 115/78 Patient Position (if appropriate): Lying Oxygen Therapy SpO2: 99 % O2 Device: Room Air Pain:   ADL: ADL Eating: Not  assessed Grooming: Not assessed Upper Body Bathing: Moderate assistance Where Assessed-Upper Body Bathing: Edge of bed Lower Body Bathing: Moderate assistance Where Assessed-Lower Body Bathing: Edge of bed Upper Body Dressing: Moderate assistance Where Assessed-Upper Body Dressing: Edge of bed Lower Body Dressing: Moderate assistance Toileting: Moderate assistance Where Assessed-Toileting: Teacher, adult education: Maximal Dentist Method: Surveyor, minerals: Acupuncturist: Not assessed   Therapy/Group: Individual Therapy  Alen Bleacher 01/14/2018, 4:04 PM

## 2018-01-14 NOTE — Patient Care Conference (Signed)
Inpatient RehabilitationTeam Conference and Plan of Care Update Date: 01/13/2018   Time: 2:15 PM    Patient Name: Tammie Miles      Medical Record Number: [id number]  Date of Birth: Aug 22, 1975 Sex: Female         Room/Bed: 4W11C/4W11C-01 Payor Info: Payor: MEDICAID PENDING / Plan: MEDICAID PENDING / Product Type: *No Product type* /    Admitting Diagnosis: Stroke ICH  Admit Date/Time:  01/08/2018  4:31 PM Admission Comments: No comment available   Primary Diagnosis:  <principal problem not specified> Principal Problem: <principal problem not specified>  Patient Active Problem List   Diagnosis Date Noted  . Tracheostomy in place [facility])   . Pressure injury of skin 12/29/2017  . Altered mental status   . Central venous catheter in place   . 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 Hypertensive emergency 12/02/2017  . Anemia 12/02/2017  . Leukocytosis 12/02/2017  . Acute intra-cranial hemorrhage (HCC)   . Respiratory failure (HCC)   . ICH (intracerebral hemorrhage) (HCC) 11/27/2017  . Intracranial hemorrhage (HCC) 11/27/2017    Expected Discharge Date: Expected Discharge Date: (SNF)  Team Members Present: Physician leading conference: Dr. Faith Rogue Social Worker Present: Amada Jupiter, LCSW Nurse Present: Other (comment)(Mekides Nida, LPN) PT Present: Teodoro Kil, PT OT Present: Callie Fielding, OT SLP Present: Colin Benton, SLP PPS Coordinator present : Tora Duck, RN, CRRN     Current Status/Progress Goal Weekly Team Focus  Medical   left MCA infarct with hemorrhage and right hemiparesis, aphasia  improve communication and self-nutrition  nutrition, trach removal and stoma mgt, bp control   Bowel/Bladder   cont/incont b/b; lbm 11/4  cont b/b with mod assist  timed toileting; assess q shift and prn   Swallow/Nutrition/ Hydration   dys 1 and thin -  supervision-min A encourage intake  Supervision A  dys 2 trials and increase intake   ADL's   Min A bathing sit<stand at sink, Mod A UB/LB dressing, Mod A stand pivot toilet transfers +toileting  Supervision/cuing   Rt NMR, balance, functional transfers, cognitive remediation, general strengthening + endurance    Mobility   min assist overall, R hemiparesis UE>LE  supervision overall  R NMR, activity tolerance, R attention, functional mobility   Communication   Total-Max A  Mod A multimodal   communicate wants/needs, yes/no response, vocalizations   Safety/Cognition/ Behavioral Observations  Max A  Mod A  ADL and basic problem solving, sustained attention, following commands   Pain   faces=0  faces=2 or less  assess q shift and prn   Skin   ecchymosis BLE and BUE; PEG tube abdomen; flap incision site skin glue; L head incsion site OTA; MASD to coccyx  free from infection/breakdown with mod assist  assess q shift and prn    Rehab Goals Patient on target to meet rehab goals: Yes *See Care Plan and progress notes for long and short-term goals.     Barriers to Discharge  Current Status/Progress Possible Resolutions Date Resolved   Physician    Medical stability;Trach;Nutrition means        see medical progress notes      Nursing                  PT                    OT Medical stability;Incontinence;Trach  D/c SNF  SLP                SW Lack of/limited family support;Insurance for SNF coverage Plan for SNF with LOG            Discharge Planning/Teaching Needs:  Plan upon d/c is to transition to SNF  NA   Team Discussion:  Aphasic, hemiparesis;  Good with diet and hope to d/c peg soon.  Need cues to complete meals.  [name] out and site healing very well.  Cont b/b.  Min assist gait and tfs.  Trial of Dys 2.  Team feels ready for transition to SNF anytime.  Revisions to Treatment Plan:  NA    Continued Need for Acute Rehabilitation Level of Care: The patient  requires daily medical management by a physician with specialized training in physical medicine and rehabilitation for the following conditions: Daily direction of a multidisciplinary physical rehabilitation program to ensure safe treatment while eliciting the highest outcome that is of practical value to the patient.: Yes Daily medical management of patient stability for increased activity during participation in an intensive rehabilitation regime.: Yes Daily analysis of laboratory values and/or radiology reports with any subsequent need for medication adjustment of medical intervention for : Neurological problems;Nutritional problems   I attest that I was present, lead the team conference, and concur with the assessment and plan of the team.   Tammie Miles 01/14/2018, 11:20 AM

## 2018-01-14 NOTE — Progress Notes (Signed)
[place] PHYSICAL MEDICINE & REHABILITATION PROGRESS NOTE   Subjective/Complaints: No new issues. Sleeping when I arrived  ROS: limited due to language/communication    Objective:   No results found. No results for input(s): WBC, HGB, HCT, PLT in the last 72 hours. Recent Labs    01/14/18 0420  NA 141  K 4.1  CL 108  CO2 26  GLUCOSE 92  BUN 11  CREATININE 0.81  CALCIUM 9.9    Intake/Output Summary (Last 24 hours) at 01/14/2018 0858 Last data filed at 01/14/2018 0757 Gross per 24 hour  Intake 380 ml  Output -  Net 380 ml     Physical Exam: Vital Signs Blood pressure 127/87, pulse 87, temperature 98.1 F (36.7 C), temperature source Oral, resp. rate 18, height 5\' 1"  (1.549 m), weight 67.3 kg, SpO2 97 %. Constitutional: No distress . Vital signs reviewed. HEENT: EOMI, oral membranes moist Neck: supple Cardiovascular: RRR without murmur. No JVD    Respiratory: CTA Bilaterally without wheezes or rales. Normal effort    GI: BS +, non-tender, non-distended  Neurological:remains alert Aphasic  Motor strength is 0/5 RUE and RLE.  LUE and LLE grossly 4+ to 5/5   Sensation winces to pinch on left side as well as in right lower extremity, no reaction to pinch right upper extremity---no change 3-4 beats of clonus Right ankle, DTR's 3+ on right      Assessment/Plan: 1. Functional deficits secondary to large left MCA infarct which require 3+ hours per day of interdisciplinary therapy in a comprehensive inpatient rehab setting.  Physiatrist is providing close team supervision and 24 hour management of active medical problems listed below.  Physiatrist and rehab team continue to assess barriers to discharge/monitor patient progress toward functional and medical goals  Care Tool:  Bathing    Body parts bathed by patient: Chest, Abdomen, Front perineal area, Buttocks, Right upper leg, Left upper leg, Right lower leg, Left lower leg, Face   Body parts bathed by helper:  Right arm, Left arm     Bathing assist Assist Level: Minimal Assistance - Patient > 75%     Upper Body Dressing/Undressing Upper body dressing   What is the patient wearing?: Pull over shirt    Upper body assist Assist Level: Moderate Assistance - Patient 50 - 74%    Lower Body Dressing/Undressing Lower body dressing      What is the patient wearing?: Incontinence brief, Pants     Lower body assist Assist for lower body dressing: Moderate Assistance - Patient 50 - 74%     Toileting Toileting    Toileting assist Assist for toileting: Moderate Assistance - Patient 50 - 74%     Transfers Chair/bed transfer  Transfers assist     Chair/bed transfer assist level: Minimal Assistance - Patient > 75%     Locomotion Ambulation   Ambulation assist      Assist level: Moderate Assistance - Patient 50 - 74% Assistive device: Walker-rolling Max distance: 10'   Walk 10 feet activity   Assist     Assist level: Minimal Assistance - Patient > 75% Assistive device: Walker-rolling, Orthosis   Walk 50 feet activity   Assist    Assist level: Minimal Assistance - Patient > 75% Assistive device: Walker-rolling, Orthosis    Walk 150 feet activity   Assist Walk 150 feet activity did not occur: Safety/medical concerns         Walk 10 feet on uneven surface  activity   Assist Walk  10 feet on uneven surfaces activity did not occur: Safety/medical concerns         Wheelchair     Assist Will patient use wheelchair at discharge?: Yes Type of Wheelchair: Manual    Wheelchair assist level: Minimal Assistance - Patient > 75% Max wheelchair distance: 50    Wheelchair 50 feet with 2 turns activity    Assist    Wheelchair 50 feet with 2 turns activity did not occur: Safety/medical concerns   Assist Level: Minimal Assistance - Patient > 75%   Wheelchair 150 feet activity     Assist Wheelchair 150 feet activity did not occur: Safety/medical  concerns        Medical Problem List and Plan: 1. secondary to acute large left MCA infarction with large hemorrhagic transformation with uncal herniation status post hemicraniotomy 11/28/2017 for evacuation of hematoma and increased ICP due to left MCA occlusion  -Continue CIR therapies including PT, OT, and SLP  2. DVT Prophylaxis/Anticoagulation: Subcutaneous heparin 12/15/2017. Venous Doppler studies negative 3. Pain Management:Tylenol as needed 4. Mood/history of ZOX:WRUEAVW had been on Adderall prior to admission.   5. Neuropsych: This patientis notyet capable of making decisions on herown behalf. 6. Skin/Wound Care:Routine skin checks, nutrition 7. Fluids/Electrolytes/Nutrition:  supervision for initiation when eating most likely   -I personally reviewed the patient's labs today.   8.Seizure prophylaxis. 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 Decannulated 11/1---occlusive dressing--area closing 10.Dysphagia. Dysphagia #1 thin liquid diet. PEG tube 12/08/2017.   -weaning off TF  -push PO (eating 50-70%) 11.Hypertension. Norvasc 10 mg daily, labetalol 300 mg 3 times daily. Monitor with increased mobility 12. Acute blood loss anemia.   -hgb hovering around 10.7-11    LOS: 6 days A FACE TO FACE EVALUATION WAS PERFORMED  Ranelle Oyster 01/14/2018, 8:58 AM

## 2018-01-14 NOTE — Plan of Care (Signed)
  Problem: RH Car Transfers Goal: LTG Patient will perform car transfers with assist (PT) Description LTG: Patient will perform car transfers with assistance (PT). Outcome: Not Applicable Flowsheets (Taken 01/14/2018 0932) LTG: Pt will perform car transfers with assist:: -- (d/c goal) Note:  D/C goal as pt will d/c to SNF   Problem: RH Ambulation Goal: LTG Patient will ambulate in controlled environment (PT) Description LTG: Patient will ambulate in a controlled environment, # of feet with assistance (PT). Flowsheets (Taken 01/14/2018 0931) LTG: Pt will ambulate in controlled environ  assist needed:: Contact Guard/Touching assist (downgraded 11/6 cw) Note:  Downgraded due to pt progress   Problem: RH Wheelchair Mobility Goal: LTG Patient will propel w/c in controlled environment (PT) Description LTG: Patient will propel wheelchair in controlled environment, # of feet with assist (PT) Flowsheets (Taken 01/14/2018 0931) LTG: Pt will propel w/c in controlled environ  assist needed:: Minimal Assistance - Patient > 75% (downgraded 11/6) Note:  Downgraded due to pt progress

## 2018-01-14 NOTE — Progress Notes (Signed)
Physical Therapy Session Note  Patient Details  Name: Tammie Miles MRN: [id number] Date of Birth: Jul 26, 1975  Today's Date: 01/14/2018 PT Individual Time: 0800-0900 PT Individual Time Calculation (min): 60 min   Short Term Goals: Week 1:  PT Short Term Goal 1 (Week 1): Pt will perform least restrictive transfers with min A consistently PT Short Term Goal 2 (Week 1): Pt will propel manual w/c x 50 ft with min A PT Short Term Goal 3 (Week 1): Pt will ambulate x 50 ft with assist x 1  Skilled Therapeutic Interventions/Progress Updates:   Pt lying in bed, wet brief on floor and bedsheets saturated.  PT provided set up for brief cleaning up, donned diaper and clean gown.  Pt rolled R with gestures, max assist to roll L.    Supine 10 x 1 each neuromuscular re-education via demo and multimodal cues for bil bridging, L long arc quad knee extnesion, R/L straight leg raises, bil lower trunk rotation, bil hip abduction/adduction in hook lying.  Pt able to activate R hip adductors, abductors, quads.  Supine> sit with min assist. Squat pivot to L bed> w/c with min/mod assist.  neuromuscular re-education via forced use, mulitmodal cues for alternating reciprocal movement x bil LEs seated in w/c using kinetron at level 60 cm/sec, and unilaterally with RLE at level 75 cm/sec, x 2 minutes.  Pt attended to R knee when PT tapped it, and extended x 10 before fatiguing.   Gait training x 80' with RW on level tile, CG>min assist on turns.  W/c propulsion using hemi method over level tile, x 80' with min/mod assist to use LLE for steering.  Transferred to recliner.  Pt left resting with needs at hand and seat belt alarm connected.  PT re-connected it x 3, but green monitor did not light up.  PT informed Latanesha, NT who will follow up.     Therapy Documentation Precautions:  Precautions Precautions: Fall Precaution Comments: trach site (decanulated 11/1), PEG, no bone flap Lt skull  Required Braces  or Orthoses: Other Brace/Splint Other Brace/Splint: Helmet when OOB. Ok to doff while lying in bed  Restrictions Weight Bearing Restrictions: No   Pain: no s/s of pain       Therapy/Group: Individual Therapy  Tammie Miles 01/14/2018, 10:24 AM

## 2018-01-14 NOTE — Progress Notes (Signed)
Speech Language Pathology Daily Session Note  Patient Details  Name: Tammie Miles MRN: [id number] Date of Birth: 1975/05/13  Today's Date: 01/14/2018 SLP Individual Time: 0930-1030 SLP Individual Time Calculation (min): 60 min  Short Term Goals: Week 1: SLP Short Term Goal 1 (Week 1): Pt will communicate wants and needs via multimodal communication in 5 out of 10 opportunties with Max A cues.  SLP Short Term Goal 2 (Week 1): Pt will produce voicing in 25% of opportunties with Max A cues.  SLP Short Term Goal 3 (Week 1): Pt will sustain attention to functional task for ~ 5 minutes with Max A cues.  SLP Short Term Goal 4 (Week 1): Pt will complete basic familiar tasks related to ADLs with Max A cues.  SLP Short Term Goal 5 (Week 1): Pt will consume trials of dysphagia 2 with minimal overt s/s of dysphagia and Max A cues to demonstrate readiness for diet upgrade.   Skilled Therapeutic Interventions:Skilled ST services focused on cognitive skills. SLP facilitated basic problem solving skills while brushing teeth, pt required min A verbal cues. SLP facilitated basic problem solving skills sort 6 colors, pt required mod A fade to mod I, sequenced 3 step picture cards given initial demonstration demonstrated 9 out 10 accuracy with supervision A verbal cues and 4 step sequence cards mod A verbal cues. SLP facilitated identification of common objects in a field up to 4 with supervision A verbal cues. SLP facilitated yes/no response pertaining to immediate environment and biographical information pt demonstrated 1 out 6 accuracy given max A multimodal cues and vocalized "no." Pt was behind nurse's station with chair alarm set. SLP reccomends to continue skilled services.     Pain Pain Assessment Pain Score: 0-No pain  Therapy/Group: Individual Therapy  Tashona Calk  [facility] 01/14/2018, 4:48 PM

## 2018-01-15 ENCOUNTER — Inpatient Hospital Stay (HOSPITAL_COMMUNITY): Payer: Self-pay | Admitting: Speech Pathology

## 2018-01-15 ENCOUNTER — Inpatient Hospital Stay (HOSPITAL_COMMUNITY): Payer: Self-pay | Admitting: Physical Therapy

## 2018-01-15 ENCOUNTER — Inpatient Hospital Stay (HOSPITAL_COMMUNITY): Payer: Self-pay | Admitting: Occupational Therapy

## 2018-01-15 MED ORDER — ACETAMINOPHEN 325 MG PO TABS: 650.0000 mg | ORAL_TABLET | ORAL | Status: DC | PRN

## 2018-01-15 MED ORDER — FREE WATER: 150.0000 mL | Status: DC

## 2018-01-15 MED ORDER — JEVITY 1.2 CAL PO LIQD: 360.0000 mL | Freq: Three times a day (TID) | ORAL | 0 refills | Status: DC

## 2018-01-15 MED ORDER — FREE WATER
150.0000 mL | Freq: Two times a day (BID) | Status: DC
  Administered 2018-01-15 – 2018-01-20 (×11): 150 mL

## 2018-01-15 NOTE — Progress Notes (Signed)
Physical Therapy Weekly Progress Note  Patient Details  Name: SOWMYA PARTRIDGE MRN: [id number] Date of Birth: 10/13/1975  Beginning of progress report period: January 09, 2018 End of progress report period: January 15, 2018    Patient has met 2 of 3 short term goals.  Pt is making excellent progress towards LTGs.  She is currently able to perform all functional mobility at overall min assist level with mod cues for safety and sequencing.  Patient continues to demonstrate the following deficits muscle weakness, impaired timing and sequencing, abnormal tone, unbalanced muscle activation and decreased coordination, decreased attention to right, decreased problem solving, decreased safety awareness and delayed processing and decreased sitting balance, decreased standing balance, decreased postural control, hemiplegia and decreased balance strategies and therefore will continue to benefit from skilled PT intervention to increase functional independence with mobility.  Patient progressing toward long term goals..  Continue plan of care.  PT Short Term Goals Week 1:  PT Short Term Goal 1 (Week 1): Pt will perform least restrictive transfers with min A consistently PT Short Term Goal 1 - Progress (Week 1): Met PT Short Term Goal 2 (Week 1): Pt will propel manual w/c x 50 ft with min A PT Short Term Goal 2 - Progress (Week 1): Not met PT Short Term Goal 3 (Week 1): Pt will ambulate x 50 ft with assist x 1 PT Short Term Goal 3 - Progress (Week 1): Met Week 2:  PT Short Term Goal 1 (Week 2): =LTGs due to ELOS    Michel Santee 01/15/2018, 1:59 PM

## 2018-01-15 NOTE — Progress Notes (Signed)
Physical Therapy Session Note  Patient Details  Name: SHAENA PARKERSON MRN: [id number] Date of Birth: 11-02-1975  Today's Date: 01/15/2018 PT Individual Time: 0800-0830 PT Individual Time Calculation (min): 30 min   Short Term Goals: Week 2:  PT Short Term Goal 1 (Week 2): =LTGs due to ELOS  Skilled Therapeutic Interventions/Progress Updates:    Pt received seated in bed finishing breakfast with NT, agreeable to PT. No indications of pain. Supine to sit with Supervision. Sit to stand with min A to RW. Ambulation from bed to bathroom with RW and R hand orthosis and min A for balance, verbal cues to attend to obstacles in R visual field. Toilet transfer with min A. Pt is setup assist for clothing management and pericare. Assisted pt back to bed at end of therapy session, Supervision for sit to supine. Pt left semi-reclined in bed with needs in reach, bed alarm in place, bedrails in place.  Therapy Documentation Precautions:  Precautions Precautions: Fall Precaution Comments: trach site (decanulated 11/1), PEG, no bone flap Lt skull  Required Braces or Orthoses: Other Brace/Splint Other Brace/Splint: Helmet when OOB. Ok to doff while lying in bed  Restrictions Weight Bearing Restrictions: No   Therapy/Group: Individual Therapy  Peter Congo, PT, DPT  01/15/2018, 3:51 PM

## 2018-01-15 NOTE — Progress Notes (Signed)
Occupational Therapy Session Note  Patient Details  Name: Tammie Miles MRN: [id number] Date of Birth: 11/19/1975  Today's Date: 01/15/2018 OT Individual Time: 1000-1058 OT Individual Time Calculation (min): 58 min    Short Term Goals: Week 1:  OT Short Term Goal 1 (Week 1): Pt will thread R LE into pants with supervision assist  OT Short Term Goal 2 (Week 1): Pt will require steady assist for standing balance during LB self care using LRAD OT Short Term Goal 3 (Week 1): Pt will don shirt with Min A   Skilled Therapeutic Interventions/Progress Updates:    Pt presents supine in bed without signs or symptoms of pain this session. Pt performs bed mobility with minA to scoot RLE/R hip towards EOB and verbal cues for technique. Provided totalA to don TEDs/footwear. Pt ambulated approx 10' to bathroom using RW with minA and increased time. Pt able to pull briefs down with minA for standing balance, performed peri-care after voiding bladder with setup assist. TotalA provided to don new brief with minA for standing balance. Pt requiring modA when ambulating out of bathroom as pt running into R side of doorframe, requiring increased cues/assist to correct. After seated rest break in w/c pt stood at sink to wash hands with minA for balance and hand over hand assist/verbal cues to incorporate RUE into task. Pt transitioned to sitting in w/c for completion of additional grooming ADL with cues provided throughout. Transported pt totalA via w/c to therapy gym. Seated in gym pt engaged in card matching activity, completing x2 sets with min questioning cues for accuracy. Returned to room via w/c, pt completed stand pivot transfer with modA w/c>EOB. Pt returned to supine with CGA, while in supine completed PROM to RUE. Pt left supine in bed end of session with bed alarm set, floor mats in place, needs within reach.   Therapy Documentation Precautions:  Precautions Precautions: Fall Precaution Comments: trach  site (decanulated 11/1), PEG, no bone flap Lt skull  Required Braces or Orthoses: Other Brace/Splint Other Brace/Splint: Helmet when OOB. Ok to doff while lying in bed  Restrictions Weight Bearing Restrictions: No      Therapy/Group: Individual Therapy  Orlando Penner 01/15/2018, 8:20 AM

## 2018-01-15 NOTE — Progress Notes (Signed)
[place] PHYSICAL MEDICINE & REHABILITATION PROGRESS NOTE   Subjective/Complaints: No new problems. Able to sleep last night  ROS: limited due to language/communication    Objective:   No results found. No results for input(s): WBC, HGB, HCT, PLT in the last 72 hours. Recent Labs    01/14/18 0420  NA 141  K 4.1  CL 108  CO2 26  GLUCOSE 92  BUN 11  CREATININE 0.81  CALCIUM 9.9    Intake/Output Summary (Last 24 hours) at 01/15/2018 1117 Last data filed at 01/15/2018 0852 Gross per 24 hour  Intake 737 ml  Output -  Net 737 ml     Physical Exam: Vital Signs Blood pressure 121/81, pulse 86, temperature 98.5 F (36.9 C), temperature source Oral, resp. rate 18, height 5\' 1"  (1.549 m), weight 67.3 kg, SpO2 98 %. Constitutional: No distress . Vital signs reviewed. HEENT: EOMI, oral membranes moist Neck: supple Cardiovascular: RRR without murmur. No JVD    Respiratory: CTA Bilaterally without wheezes or rales. Normal effort    GI: BS +, non-tender, non-distended  Neurological:remains alert Aphasic  Motor strength is 0/5 RUE and RLE.  LUE and LLE grossly 4+ to 5/5   Sensation winces to pinch on left side as well as in right lower extremity, no reaction to pinch right upper extremity---neuro exam unchanged 3-4 beats of clonus Right ankle, DTR's 3+ on right      Assessment/Plan: 1. Functional deficits secondary to large left MCA infarct which require 3+ hours per day of interdisciplinary therapy in a comprehensive inpatient rehab setting.  Physiatrist is providing close team supervision and 24 hour management of active medical problems listed below.  Physiatrist and rehab team continue to assess barriers to discharge/monitor patient progress toward functional and medical goals  Care Tool:  Bathing    Body parts bathed by patient: Chest, Abdomen, Front perineal area, Buttocks, Right upper leg, Left upper leg, Right lower leg, Left lower leg, Face   Body parts  bathed by helper: Right arm, Left arm     Bathing assist Assist Level: Minimal Assistance - Patient > 75%     Upper Body Dressing/Undressing Upper body dressing   What is the patient wearing?: Hospital gown only    Upper body assist Assist Level: Moderate Assistance - Patient 50 - 74%    Lower Body Dressing/Undressing Lower body dressing      What is the patient wearing?: Incontinence brief     Lower body assist Assist for lower body dressing: Moderate Assistance - Patient 50 - 74%     Toileting Toileting    Toileting assist Assist for toileting: Moderate Assistance - Patient 50 - 74%     Transfers Chair/bed transfer  Transfers assist     Chair/bed transfer assist level: Moderate Assistance - Patient 50 - 74%     Locomotion Ambulation   Ambulation assist      Assist level: Minimal Assistance - Patient > 75% Assistive device: Walker-rolling Max distance: 80   Walk 10 feet activity   Assist     Assist level: Minimal Assistance - Patient > 75% Assistive device: Walker-rolling   Walk 50 feet activity   Assist    Assist level: Minimal Assistance - Patient > 75% Assistive device: Walker-rolling    Walk 150 feet activity   Assist Walk 150 feet activity did not occur: Safety/medical concerns         Walk 10 feet on uneven surface  activity   Assist Walk 10  feet on uneven surfaces activity did not occur: Safety/medical concerns         Wheelchair     Assist Will patient use wheelchair at discharge?: Yes Type of Wheelchair: Manual    Wheelchair assist level: Moderate Assistance - Patient 50 - 74% Max wheelchair distance: 80    Wheelchair 50 feet with 2 turns activity    Assist    Wheelchair 50 feet with 2 turns activity did not occur: Safety/medical concerns   Assist Level: Moderate Assistance - Patient 50 - 74%   Wheelchair 150 feet activity     Assist Wheelchair 150 feet activity did not occur: Safety/medical  concerns        Medical Problem List and Plan: 1. 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 DVT Prophylaxis/Anticoagulation: Subcutaneous heparin 12/15/2017. Venous Doppler studies negative 3. Pain Management:Tylenol as needed 4. Mood/history of ZOX:WRUEAVW had been on Adderall prior to admission.   5. Neuropsych: This patientis notyet capable of making decisions on herown behalf. 6. Skin/Wound Care:Routine skin checks, nutrition 7. Fluids/Electrolytes/Nutrition:  supervision for initiation when eating most likely       8.Seizure prophylaxis. 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 Decannulated 11/1---occlusive dressing--area closing 10.Dysphagia. Dysphagia #1 thin liquid diet. PEG tube 12/08/2017.   -off TF but can continue H20 flushes (decrease to BID)  -push PO (eating nearly 75%) 11.Hypertension. Norvasc 10 mg daily, labetalol 300 mg 3 times daily. Monitor with increased mobility 12. Acute blood loss anemia.   -hgb hovering around 10.7-11    LOS: 7 days A FACE TO FACE EVALUATION WAS PERFORMED  Ranelle Oyster 01/15/2018, 11:17 AM

## 2018-01-15 NOTE — Discharge Summary (Signed)
Discharge summary job # [id number]

## 2018-01-15 NOTE — Discharge Summary (Addendum)
NAME: Tammie Miles, Tammie Miles MEDICAL RECORD ZO:[id number] ACCOUNT [id number] DATE OF BIRTH:01/19/1976 FACILITY: [facility] LOCATION: [facility] PHYSICIAN:ZACHARY SWARTZ, MD  DISCHARGE SUMMARY  DATE OF DISCHARGE:  01/16/2018  DATE OF ADMISSION:  01/08/2018  DATE OF DISCHARGE:  01/20/2018   DISCHARGE DIAGNOSES: 1.  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 2.  Subcutaneous heparin for deep venous thrombosis prophylaxis initiated 12/15/2017. 3.  Seizure prophylaxis.   4.  Tracheostomy decannulated 01/09/2018. 5.  Dysphagia with gastrostomy tube 12/08/2017. 6.  Hypertension. 7.  Acute blood loss anemia.  HOSPITAL COURSE:  This is a 42 year old right-handed female with history of hypertension and anxiety who lives with her boyfriend and 46-year-old child.  Presented 11/27/2017 with altered mental status, became obtunded.  Urine drug screen positive for  amphetamines.  The patient does take Adderall at home, alcohol negative, lactic acid 4.3.  Cranial CT scan showed a large area of hemorrhage in the left basal ganglia and left frontal lobe, 5.8 x 3.5 cm with a 15 mm left to right midline shift.  CT  angiogram of the head positive for left MCA M1 large vessel occlusion and also severe stenosis at the left ICA terminus.  Negative for intracranial aneurysm.  Neurosurgery consulted Dr. Conchita Paris.  Underwent left frontotemporal craniotomy evacuation of  hematoma followed by left decompressive craniectomy.  Skull flap placed abdomen 11/28/2017 due to elevated ICP.  Postoperative changes from craniotomy 8-9 mm right to left midline shift.  Echocardiogram, ejection fraction 65%.  Lower extremity Dopplers  negative.  The EEG showed no seizure.  Maintained on Keppra.  Latest followup CT and imaging left MCA distribution infarction, mild increase of edema of infarct with increased herniation of the left frontal lobe via the left  frontal craniectomy stable 6  mm left to right midline shift.  She was later placed on aspirin for CVA prophylaxis.  Hospital course prolonged intubation.  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  Capping trials underway for possible decannulation.  Gastrostomy tube placed on 12/08/2017 for nutritional support.  Recently placed on a dysphagia #1 thin liquid diet.  Subcutaneous  heparin for DVT prophylaxis.  The patient was admitted for comprehensive rehabilitation program.  PAST MEDICAL HISTORY:  See discharge diagnoses.  SOCIAL HISTORY:  Lives with boyfriend and 39-year-old child.  FUNCTIONAL STATUS:  Upon admission to rehab services, moderate assist 45 feet rolling walker, minimal assist sit to stand, total assist with activities of daily living.  PHYSICAL EXAMINATION: VITAL SIGNS:  Blood pressure 146/90, pulse 90, temperature 98, respirations 18. GENERAL:  Alert female, nonverbal, inconsistent to follow commands.  The tracheostomy tube was in place as well as gastrostomy tube.  Pupils reactive to light. LUNGS:  Clear to auscultation. CARDIOVASCULAR:  Rate controlled. ABDOMEN:  Soft, nontender, good bowel sounds. LUNGS:  Clear to auscultation.  REHABILITATION HOSPITAL COURSE:  The patient was admitted to inpatient rehabilitation services.  Therapies initiated on a 3-hour daily basis, consisting of physical therapy, occupational therapy, speech therapy and rehabilitation nursing.  The following  issues were addressed during patient's rehabilitation stay.  Pertaining to the patient's large hemorrhagic transformation MCA infarction, she had undergone hemicraniotomy evacuation of hematoma 11/28/2017.  She would follow up with neurosurgery.  Aspirin  had been initiated.  Subcutaneous heparin during her rehabilitation hospital stay initiated 12/15/2017.  Venous Doppler studies negative.  Seizure prophylaxis with Keppra.  No seizure activity.  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  She was decannulated 01/09/2018.   Oxygen  saturations greater than 90%.  Gastrostomy tube  at 12/08/2017 per Dr. Janee Morn for nutritional support.  Diet of dysphagia #1 thin liquid diet.Free water BID.  Blood pressure is  controlled monitored.  She remained on Norvasc as well as labetalol.  The patient received weekly collaborative interdisciplinary team conferences to discuss estimated length of stay, family teaching, any barriers to her discharge.  Supine to sit with  minimal assistance.  Wheelchair propulsion 80 feet min mod assist to use left lower extremity for steering, ambulates 80 feet rolling walker level tile contact guard minimal assist.  She could communicate some very simple basic needs.  Activities of  daily living and homemaking.  Ambulates into the bathroom, seated for simple hygiene.  Speech therapy working with cognitive deficits.  Requires moderate assist for sequencing.  Plans for skilled nursing facility placement with bed becoming available  01/16/2018.  DISCHARGE MEDICATIONS:  Included Norvasc 10 mg p.o. daily, aspirin 81 mg p.o. daily, Pepcid 20 mg p.o. b.i.d., ferrous sulfate 1 mg p.o. daily, labetalol 300 mg p.o. t.i.d., Keppra 1000 mg p.o. q.12h., multivitamin 1 p.o. Daily,folic acid 1 mg daily,psyllium 1 packet twice a day.  DIET:  Her diet was a dysphagia #1 thin liquid . Free water 150 mL BID  by tube.  SPECIAL INSTRUCTIONS:  If the patient unable to tolerate p.o. medications, can be crushed and given through PEG tube if needed.  FOLLOWUP:  The patient would follow up with Dr. Faith Rogue at the outpatient rehab service office as directed; Dr. Conchita Paris, call for appointment.  TN/NUANCE D:01/15/2018 T:01/15/2018 JOB:[id number]

## 2018-01-15 NOTE — Progress Notes (Signed)
Social Work Patient ID: Tammie Miles, female   DOB: May 06, 1975, 42 y.o.   MRN: [id number]   Have received a SNF bed offer from [facility] who can admit pt tomorrow.  Have left VM and text message for son to call me ASAP to discuss.  Will plan to transfer to facility tomorrow via ambulance.  Tx team aware.  Lindia Garms, LCSW

## 2018-01-15 NOTE — Progress Notes (Signed)
Physical Therapy Session Note  Patient Details  Name: Tammie Miles MRN: [id number] Date of Birth: [birth year]  Today's Date: 01/15/2018 PT Individual Time: 6701-4103 PT Individual Time Calculation (min): 54 min   Short Term Goals: Week 1:  PT Short Term Goal 1 (Week 1): Pt will perform least restrictive transfers with min A consistently PT Short Term Goal 2 (Week 1): Pt will propel manual w/c x 50 ft with min A PT Short Term Goal 3 (Week 1): Pt will ambulate x 50 ft with assist x 1  Skilled Therapeutic Interventions/Progress Updates:    no c/o pain based on faces scale.  Session focus on problem solving, visual scanning, and NMR via self care and functional mobility tasks.    Pt transitions to EOB with supervision and increased time with cues for attention to RLE/RUE.  LB dressing from EOB with assist to thread RLE and min assist for sit<>stand to pull pants over hips.  UB dressing with assist to thread RUE and pull over trunk.  Ambulation to therapy gym with RW/hand splint with min guard and cues for attention to RLE within walker.  Seated table top task focus on visual scanning and problem solving with bug puzzle.  Pt able to sort pieces by color and transition to container without cues for visual scanning, unable to piece together any pieces despite max cues.  NMR via forward/retro gait at rail in hallway, mod assist overall for balance and max multimodal cues for stepping RLE backwards and increased step length bilaterally in retro-gait.  Gait back to room with HHA, cues for safe approach to w/c to sit.  Positioned upright in w/c with chair alarm intact, call bell in reach, and needs met.   Therapy Documentation Precautions:  Precautions Precautions: Fall Precaution Comments: trach site (decanulated 11/1), PEG, no bone flap Lt skull  Required Braces or Orthoses: Other Brace/Splint Other Brace/Splint: Helmet when OOB. Ok to doff while lying in bed  Restrictions Weight Bearing  Restrictions: No    Therapy/Group: Individual Therapy  Michel Santee 01/15/2018, 1:55 PM

## 2018-01-15 NOTE — Progress Notes (Signed)
Occupational Therapy Session Note  Patient Details  Name: Tammie Miles MRN: [id number] Date of Birth: 11/03/1975  Today's Date: 01/14/2018 OT Individual Time:  - 11:30-12:00 (30 min)  Late entry  1:1 Focus on self care retraining including dressing in regular street clothes with focus on hemi dressing techniques with awareness of right UE. Pt continues to present with clonus in right UE at times with movement/ weight bearing. Pt did make attempts for speech but with difficulty and inconsistent with yes/ nos. Pt able to perform basic transfer out of bed with min A with tactile cues for sequencing. No active movement detected at this time with attempts for [facility] with simple commands. Engaged in eating breakfast after setup and min cues throughout meal for pocketing and oral spillage. LEft with NT to finish lunch.       Short Term Goals: Week 1:  OT Short Term Goal 1 (Week 1): Pt will thread R LE into pants with supervision assist  OT Short Term Goal 2 (Week 1): Pt will require steady assist for standing balance during LB self care using LRAD OT Short Term Goal 3 (Week 1): Pt will don shirt with Min A  Week 2:    Week 3:     Skilled Therapeutic Interventions/Progress Updates:      Therapy Documentation Precautions:  Precautions Precautions: Fall Precaution Comments: trach site (decanulated 11/1), PEG, no bone flap Lt skull  Required Braces or Orthoses: Other Brace/Splint Other Brace/Splint: Helmet when OOB. Ok to doff while lying in bed  Restrictions Weight Bearing Restrictions: No Pain:  no c/o pain in session   Therapy/Group: Individual Therapy  Roney Mans [name] 01/15/2018, 7:21 AM

## 2018-01-15 NOTE — Progress Notes (Signed)
Speech Language Pathology Weekly Progress and Session Note  Patient Details  Name: Tammie Miles MRN: [id number] Date of Birth: 05/16/1975  Beginning of progress report period: January 09, 2018 End of progress report period: January 15, 2018  Today's Date: 01/15/2018 SLP Individual Time: 1400-1500 SLP Individual Time Calculation (min): 60 min  Short Term Goals: Week 1: SLP Short Term Goal 1 (Week 1): Pt will communicate wants and needs via multimodal communication in 5 out of 10 opportunties with Max A cues.  SLP Short Term Goal 1 - Progress (Week 1): Not met SLP Short Term Goal 2 (Week 1): Pt will produce voicing in 25% of opportunties with Max A cues.  SLP Short Term Goal 2 - Progress (Week 1): Not met SLP Short Term Goal 3 (Week 1): Pt will sustain attention to functional task for ~ 5 minutes with Max A cues.  SLP Short Term Goal 3 - Progress (Week 1): Not met SLP Short Term Goal 4 (Week 1): Pt will complete basic familiar tasks related to ADLs with Max A cues.  SLP Short Term Goal 4 - Progress (Week 1): Met SLP Short Term Goal 5 (Week 1): Pt will consume trials of dysphagia 2 with minimal overt s/s of dysphagia and Max A cues to demonstrate readiness for diet upgrade.  SLP Short Term Goal 5 - Progress (Week 1): Met    New Short Term Goals: Week 2: SLP Short Term Goal 1 (Week 2): Pt will communicate wants and needs via multimodal communication in 5 out of 10 opportunties with Max A cues.  SLP Short Term Goal 2 (Week 2): Pt will produce voicing in 25% of opportunties with Max A cues.  SLP Short Term Goal 3 (Week 2): Pt will sustain attention to functional task for ~ 5 minutes with Max A cues.  SLP Short Term Goal 4 (Week 2): Pt will complete basic familiar tasks related to ADLs with Min A cues.  SLP Short Term Goal 5 (Week 2): Pt will consume trials of dysphagia 2 with minimal overt s/s of dysphagia and Min A cues to demonstrate readiness for diet upgrade.   Weekly Progress Updates:  Pt has made steady progress this reporting period and as a result she has met 2 of 5 STGs. Pt with progress towards consuming dysphagia 2 and is likely appropriate for full upgrade in next reporting period. Pt is also demonstrating functional ability to problem solve familiar tasks. She continues to need Max A cues to produce vocalizations and use gestures/facial expressions to communicate wants and needs. Pt continues to require skilled ST to target these deficits and reduce caregiver burden at time of discharge. Continue to anticipate that pt will require SNF follow up at discharge.      Intensity: Minumum of 1-2 x/day, 30 to 90 minutes Frequency: 3 to 5 out of 7 days Duration/Length of Stay: 11/15 Treatment/Interventions: Cognitive remediation/compensation;Cueing hierarchy;Dysphagia/aspiration precaution training;Multimodal communication approach;Patient/family education;Speech/Language facilitation;Therapeutic Activities;Functional tasks   Daily Session  Skilled Therapeutic Interventions: Skilled treatment session focused on dysphagia and cognition goals. SLP received pt leaning over side of wheelchair attempting to transfer herself from wheelchair to bed. Staff member helped this writer reposition pt in wheelchair. Pt agreeable to remaining in wheelchair for session. SLP further facilitated session by providing Mod A cues to control right anterior spillage with trials of dysphagia 2 and thin liquids. Pt able to complete basic problem solving with Min A cues. Pt was transferred back to bed at end of session with   bed alarm on and all needs within reach. Continue per current plan of care.      General    Pain Pain Assessment Pain Scale: Faces Faces Pain Scale: No hurt  Therapy/Group: Individual Therapy  Ammar Moffatt 01/15/2018, 4:14 PM

## 2018-01-16 ENCOUNTER — Inpatient Hospital Stay (HOSPITAL_COMMUNITY): Payer: Self-pay | Admitting: Speech Pathology

## 2018-01-16 ENCOUNTER — Inpatient Hospital Stay (HOSPITAL_COMMUNITY): Payer: Self-pay | Admitting: Occupational Therapy

## 2018-01-16 ENCOUNTER — Encounter (HOSPITAL_COMMUNITY): Payer: Self-pay | Admitting: Nurse Practitioner

## 2018-01-16 ENCOUNTER — Inpatient Hospital Stay (HOSPITAL_COMMUNITY): Payer: Self-pay | Admitting: Physical Therapy

## 2018-01-16 DIAGNOSIS — R569 Unspecified convulsions: Secondary | ICD-10-CM

## 2018-01-16 DIAGNOSIS — F329 Major depressive disorder, single episode, unspecified: Secondary | ICD-10-CM

## 2018-01-16 DIAGNOSIS — F909 Attention-deficit hyperactivity disorder, unspecified type: Secondary | ICD-10-CM

## 2018-01-16 DIAGNOSIS — F411 Generalized anxiety disorder: Secondary | ICD-10-CM

## 2018-01-16 DIAGNOSIS — F32A Depression, unspecified: Secondary | ICD-10-CM

## 2018-01-16 DIAGNOSIS — Z9181 History of falling: Secondary | ICD-10-CM

## 2018-01-16 DIAGNOSIS — G936 Cerebral edema: Secondary | ICD-10-CM

## 2018-01-16 MED ORDER — FREE WATER: 150.0000 mL | Freq: Two times a day (BID) | Status: DC

## 2018-01-16 NOTE — Progress Notes (Signed)
[facility] PHYSICAL MEDICINE & REHABILITATION PROGRESS NOTE   Subjective/Complaints: No new complaints. Slept well.   ROS: limited due to language/communication   Objective:   No results found. No results for input(s): WBC, HGB, HCT, PLT in the last 72 hours. Recent Labs    01/14/18 0420  NA 141  K 4.1  CL 108  CO2 26  GLUCOSE 92  BUN 11  CREATININE 0.81  CALCIUM 9.9    Intake/Output Summary (Last 24 hours) at 01/16/2018 0918 Last data filed at 01/15/2018 1813 Gross per 24 hour  Intake 440 ml  Output -  Net 440 ml     Physical Exam: Vital Signs Blood pressure (!) 134/94, pulse 82, temperature 97.9 F (36.6 C), resp. rate 17, height 5\' 1"  (1.549 m), weight 67.3 kg, SpO2 95 %. Constitutional: No distress . Vital signs reviewed. HEENT: EOMI, oral membranes moist Neck: supple Cardiovascular: RRR without murmur. No JVD    Respiratory: CTA Bilaterally without wheezes or rales. Normal effort    GI: BS +, non-tender, non-distended  Neurological:remains alert Aphasic  Motor strength is 0/5 RUE and RLE.  LUE and LLE grossly 4+ to 5/5   Sensation winces to pinch on left side as well as in right lower extremity, no reaction to pinch right upper extremity---stable 3-4 beats of clonus Right ankle, DTR's 3+ on right      Assessment/Plan: 1. Functional deficits secondary to large left MCA infarct which require 3+ hours per day of interdisciplinary therapy in a comprehensive inpatient rehab setting.  Physiatrist is providing close team supervision and 24 hour management of active medical problems listed below.  Physiatrist and rehab team continue to assess barriers to discharge/monitor patient progress toward functional and medical goals  Care Tool:  Bathing    Body parts bathed by patient: Chest, Abdomen, Front perineal area, Buttocks, Right upper leg, Left upper leg, Right lower leg, Left lower leg, Face   Body parts bathed by helper: Right arm, Left arm      Bathing assist Assist Level: Minimal Assistance - Patient > 75%     Upper Body Dressing/Undressing Upper body dressing   What is the patient wearing?: Pull over shirt    Upper body assist Assist Level: Moderate Assistance - Patient 50 - 74%    Lower Body Dressing/Undressing Lower body dressing      What is the patient wearing?: Pants     Lower body assist Assist for lower body dressing: Minimal Assistance - Patient > 75%     Toileting Toileting    Toileting assist Assist for toileting: Moderate Assistance - Patient 50 - 74%     Transfers Chair/bed transfer  Transfers assist     Chair/bed transfer assist level: Minimal Assistance - Patient > 75%     Locomotion Ambulation   Ambulation assist      Assist level: Minimal Assistance - Patient > 75% Assistive device: Orthosis Max distance: 150   Walk 10 feet activity   Assist     Assist level: Minimal Assistance - Patient > 75% Assistive device: Hand held assist, Walker-rolling, Orthosis   Walk 50 feet activity   Assist    Assist level: Minimal Assistance - Patient > 75% Assistive device: Hand held assist, Walker-rolling, Orthosis    Walk 150 feet activity   Assist Walk 150 feet activity did not occur: Safety/medical concerns  Assist level: Minimal Assistance - Patient > 75% Assistive device: Hand held assist, Walker-rolling, Orthosis    Walk 10 feet on  uneven surface  activity   Assist Walk 10 feet on uneven surfaces activity did not occur: Safety/medical concerns         Wheelchair     Assist Will patient use wheelchair at discharge?: Yes Type of Wheelchair: Manual    Wheelchair assist level: Moderate Assistance - Patient 50 - 74% Max wheelchair distance: 80    Wheelchair 50 feet with 2 turns activity    Assist    Wheelchair 50 feet with 2 turns activity did not occur: Safety/medical concerns   Assist Level: Moderate Assistance - Patient 50 - 74%   Wheelchair  150 feet activity     Assist Wheelchair 150 feet activity did not occur: Safety/medical concerns        Medical Problem List and Plan: 1. secondary to acute large left MCA infarction with large hemorrhagic transformation with uncal herniation status post hemicraniotomy 11/28/2017 for evacuation of hematoma and increased ICP due to left MCA occlusion  -for SNF today  -follow up with me in about 4-6 weeks 2. DVT Prophylaxis/Anticoagulation: Subcutaneous heparin 12/15/2017. Venous Doppler studies negative 3. Pain Management:Tylenol as needed 4. Mood/history of WUJ:WJXBJYN had been on Adderall prior to admission.   5. Neuropsych: This patientis notyet capable of making decisions on herown behalf. 6. Skin/Wound Care:Routine skin checks, nutrition 7. Fluids/Electrolytes/Nutrition:  supervision for initiation when eating most likely       8.Seizure prophylaxis. 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 Decannulated 11/1---occlusive dressing--area closing 10.Dysphagia. Dysphagia #1 thin liquid diet. PEG tube 12/08/2017.   -off TF but  continue H20 flushes (decreased to 100cc BID) 11.Hypertension. Norvasc 10 mg daily, labetalol 300 mg 3 times daily. Monitor with increased mobility 12. Acute blood loss anemia.   -hgb hovering around 10.7-11    LOS: 8 days A FACE TO FACE EVALUATION WAS PERFORMED  Ranelle Oyster 01/16/2018, 9:18 AM

## 2018-01-16 NOTE — Progress Notes (Signed)
Social Work Patient ID: [name], female   DOB: [birth year], 42 y.o.   MRN: [id number]  Finally received return call from pt's son, Guy Begin, ~ 6pm yesterday and we discussed plan to move his mother to SNF today at 1:00.  He was in full agreement.  He was scheduled to meet with [facility] rep on site today at 10am to complete his mom's SSD application and stated that he would come up to unit after that.  He stated understanding that he would need to go to [facility] to complete admission paperwork which he was in agreement with as well.   As of this time today, I have sent multiple calls, texts and facebook messenger notes to him and he has not responded nor arrived on the unit at all today.  I have also left messages for son's father (as [facility] had noted he was someone who might be able to connect with son if needed) and no return call yet from him either.  I have alerted tx team, unit and facility of the situation.  Unless we confirm that son is able to complete admission paperwork today we will not be able to admit to SNF today.  Will continue to make attempts to reach son.    Kai Calico, LCSW

## 2018-01-16 NOTE — Plan of Care (Signed)
  Problem: RH Bed to Chair Transfers Goal: LTG Patient will perform bed/chair transfers w/assist (PT) Description LTG: Patient will perform bed to chair transfers with assistance (PT). Outcome: Not Met (add Reason)   Problem: RH Ambulation Goal: LTG Patient will ambulate in controlled environment (PT) Description LTG: Patient will ambulate in a controlled environment, # of feet with assistance (PT). Outcome: Not Met (add Reason)   Problem: RH Wheelchair Mobility Goal: LTG Patient will propel w/c in controlled environment (PT) Description LTG: Patient will propel wheelchair in controlled environment, # of feet with assist (PT) Outcome: Not Met (add Reason)

## 2018-01-16 NOTE — Progress Notes (Signed)
Speech Language Pathology Discharge Summary  Patient Details  Name: Tammie Miles MRN: [id number] Date of Birth: Nov 18, 1975  Today's Date: 01/16/2018 SLP Individual Time: 0830-0900 SLP Individual Time Calculation (min): 30 min   Skilled Therapeutic Interventions:  Skilled treatment session focused on dysphagia and communication goals. Food items and multiple drinks presented to pt and able to able to choose items. Pt consumed graham crackers moistened in pudding with mod A cues for right anterior spillage.  Mirror used to aid pt in perceiving spillage but pt is not able to control with soft solids. She is able to contain liquids more effectively. SLP also facilitated session by providing Total A for yes/no with no indication of ability to answer correctly when related to herself. Pt not able to produce any vocalizations this session.     Patient has met (none met d/t short length of stay) of 7 long term goals.  Patient to discharge at overall Total level.  Reasons goals not met: d/t short length of stay   Clinical Impression/Discharge Summary:    Pt has not met her LTGs d/t short length of stay. Pt is currently at Max A for all tasks and is safe on puree with thin liquids. Pt could likely upgrade to dysphagia 2.    Care Partner:  Caregiver Able to Provide Assistance: No     Recommendation:  Skilled Nursing facility  Rationale for SLP Follow Up: Maximize functional communication;Maximize cognitive function and independence;Maximize swallowing safety;Reduce caregiver burden   Equipment:     Reasons for discharge: Discharged from [facility] 01/16/2018, 3:26 PM

## 2018-01-16 NOTE — Progress Notes (Signed)
Social Work Patient ID: Tammie Miles, female   DOB: 1975-08-13, 42 y.o.   MRN: [id number]  Received call from son who is apologetic and reports that "things happened that were out of my control" as he was arrested last night and just released but had to go to work. Did not ask for any additional information, simply confirmed with him that he will be here at 10am on Monday morning to accompany mother to SNF and complete admission paperwork. He is agreed and facility aware and in agreement as well.  Have alerted unit.  Jendaya Gossett, LCSW

## 2018-01-16 NOTE — Progress Notes (Signed)
Physical Therapy Discharge Summary  Patient Details  Name: Tammie Miles MRN: [id number] Date of Birth: [birth year]  Today's Date: 01/16/2018 PT Individual Time: 1000-1100 PT Individual Time Calculation (min): 60 min    Patient has met 3 of 6 long term goals due to improved activity tolerance, improved balance, improved postural control, increased strength, ability to compensate for deficits, functional use of  right lower extremity and improved attention.  Patient to discharge at ambulatory level [place].   Patient's care partner unavailable to provide the necessary physical assistance at discharge. Pt to d/c to SNF for further rehab to attempt to reach mod I level prior to returning home.   Reasons goals not met:  Pt continues to require min assist for functional transfers, ambulation, and w/c mobility due to deficits in strength, balance, and R attention.   Recommendation:  Patient will benefit from ongoing skilled PT services in skilled nursing facility setting to continue to advance safe functional mobility, address ongoing impairments in balance, awareness, postural control, strength, and attention, and minimize fall risk.  Equipment: No equipment provided  Reasons for discharge: treatment goals met  Patient/family agrees with progress made and goals achieved: Yes   Skilled PT: Pt with no reports of pain based on faces scale.  Session focus on d/c assessment, functional mobility, and NMR.  Pt transitions to EOB with supervision and mod cues for attention to RUE.  Sit<>stand throughout session with RW and without device with supervision.  Gait with R HHA throughout session and overall min assist for occasional LOB to L or R.  Pt noted to be incontinent during session, returned to room and pt demos static standing balance with LUE support and min guard while therapist changed brief, pt able to manage pants over hips for up/down.  Nustep x8 minutes with LEs only focus on reciprocal  stepping pattern retraining and attention to timer.  Pt requires min cues to cease activity at predetermined time.  Returned to room at end of session and positioned back to bed with call bell in reach and needs met.   PT Discharge Precautions/Restrictions Precautions Precautions: Fall Precaution Comments: R hemiparesis UE>LE Required Braces or Orthoses: Other Brace/Splint Other Brace/Splint: Helmet when OOB. Ok to doff while lying in bed  Restrictions Weight Bearing Restrictions: No Pain Pain Assessment Pain Scale: Faces Faces Pain Scale: No hurt Vision/Perception  Perception Perception: Impaired Inattention/Neglect: Does not attend to right side of body;Does not attend to right visual field Body Part Identification: Does not attend to R UE with transfers Praxis Praxis: Impaired Praxis Impairment Details: Ideomotor;Motor planning;Ideation  Cognition Overall Cognitive Status: No family/caregiver present to determine baseline cognitive functioning Arousal/Alertness: Awake/alert Orientation Level: (responds to name when called, unable to obtain any other orientation due to language deficits) Attention: Selective Focused Attention: Appears intact Sustained Attention: Appears intact Selective Attention: Impaired Memory: Impaired Awareness: Impaired Problem Solving: Impaired Safety/Judgment: Impaired Sensation Sensation Light Touch: Impaired by gross assessment(RLE) Coordination Gross Motor Movements are Fluid and Coordinated: No Fine Motor Movements are Fluid and Coordinated: No Coordination and Movement Description: R hemiparesis UE>LE (RUE dense) Motor  Motor Motor: Hemiplegia;Abnormal tone;Clonus Motor - Discharge Observations: R hemi UE>LE, clonus in RLE and impaired tone in RLE  Mobility Bed Mobility Bed Mobility: Supine to Sit;Sit to Supine Rolling Right: Supervision/verbal cueing Rolling Left: Supervision/Verbal cueing Supine to Sit: Supervision/Verbal  cueing Sit to Supine: Supervision/Verbal cueing Transfers Transfers: Sit to Stand;Stand to Sit;Stand Pivot Transfers Sit to Stand: Supervision/Verbal cueing Stand to Sit:  Supervision/Verbal cueing Stand Pivot Transfers: Minimal Assistance - Patient > 75% Stand Pivot Transfer Details: Verbal cues for technique;Verbal cues for precautions/safety Transfer (Assistive device): 1 person hand held assist Locomotion  Gait Ambulation: Yes Gait Assistance: Minimal Assistance - Patient > 75% Gait Distance (Feet): 150 Feet Assistive device: 1 person hand held assist Gait Assistance Details: Verbal cues for precautions/safety Gait Gait Pattern: (step to pattern, antalgic) Stairs / Additional Locomotion Stairs: Yes Stairs Assistance: Minimal Assistance - Patient > 75% Stair Management Technique: One rail Left(HHA on R) Number of Stairs: 4 Height of Stairs: 6 Wheelchair Mobility Wheelchair Mobility: No  Trunk/Postural Assessment  Cervical Assessment Cervical Assessment: Within Functional Limits Thoracic Assessment Thoracic Assessment: Within Functional Limits Lumbar Assessment Lumbar Assessment: Within Functional Limits Postural Control Postural Control: Deficits on evaluation Protective Responses: delayed an insufficient  Balance Static Sitting Balance Static Sitting - Balance Support: Feet supported;No upper extremity supported Static Sitting - Level of Assistance: 5: Stand by assistance Dynamic Sitting Balance Dynamic Sitting - Balance Support: Feet supported;No upper extremity supported;During functional activity Dynamic Sitting - Level of Assistance: 5: Stand by assistance Static Standing Balance Static Standing - Balance Support: During functional activity;No upper extremity supported Static Standing - Level of Assistance: 4: Min assist Dynamic Standing Balance Dynamic Standing - Balance Support: Right upper extremity supported;During functional activity Dynamic Standing -  Level of Assistance: 4: Min assist Extremity Assessment      RLE Assessment Passive Range of Motion (PROM) Comments: WFL Active Range of Motion (AROM) Comments: WFL General Strength Comments: able to move against gravity through full ROM, but unable to assess with resistance 2/2 language/cognitive deficits LLE Assessment Passive Range of Motion (PROM) Comments: WFL Active Range of Motion (AROM) Comments: WFL General Strength Comments: able to move against gravity through full ROM, but unable to assess with resistance 2/2 language/cognitive deficits    Michel Santee 01/16/2018, 11:00 AM

## 2018-01-16 NOTE — Progress Notes (Signed)
Occupational Therapy Discharge Summary  Patient Details  Name: Tammie Miles MRN: [id number] Date of Birth: March 08, 1976  Today's Date: 01/16/2018 OT Individual Time:  - 5093-2671 Individual Treatment Time Calculation: 57 min    Patient has met 11 of 11 long term goals due to improved activity tolerance, improved balance, postural control, ability to compensate for deficits, improved attention, improved awareness and improved coordination.  Patient to discharge at [facility] Assist level.  Patient to d/c SNF for further rehabilitation services.     All goals met   Recommendation:  Patient will benefit from ongoing skilled OT services in skilled nursing facility setting to continue to advance functional skills in the area of BADL and iADL.  Equipment: No equipment provided  Reasons for discharge: discharge from hospital  Patient/family agrees with progress made and goals achieved: Yes   OT Individual Treatment Time Calculation:  Pt greeted in bed with no s/s pain. OT donned helmet. Started tx with toileting. Stand pivot<w/c<toilet completed with Min A. Steady assist for balance when completing toileting tasks with pt having successful void of bladder. Once back in w/c, pt completed bathing/dressing sit<stand level with Min A for bathing and UB/LB dressing. With mod cuing, pt able to manage and safely position limb during functional tasks and sit<stands at sink. Grooming and oral care tasks completed with supervision while seated with facilitation of R UE weightbearing on sink. She then completed simulated TTB transfer in shower with Min A. Afterwards pt taken to RN station in w/c. She was left with helmet donned and safety belt fastened.   OT Discharge Precautions/Restrictions  Precautions Precautions: Fall Precaution Comments: R hemiparesis UE>LE Required Braces or Orthoses: Other Brace/Splint Other Brace/Splint: Helmet when OOB. Ok to doff while lying in bed  Restrictions Weight  Bearing Restrictions: No Pain Pain Assessment Pain Scale: Faces Faces Pain Scale: No hurt ADL ADL Eating: Not assessed Grooming: Supervision/safety Where Assessed-Grooming: Sitting at sink Upper Body Bathing: Minimal assistance Where Assessed-Upper Body Bathing: Sitting at sink Lower Body Bathing: Contact guard Where Assessed-Lower Body Bathing: Standing at sink, Sitting at sink Upper Body Dressing: Minimal assistance Where Assessed-Upper Body Dressing: Sitting at sink Lower Body Dressing: Minimal assistance Where Assessed-Lower Body Dressing: Standing at sink, Sitting at sink Toileting: Contact guard Where Assessed-Toileting: Glass blower/designer: Psychiatric nurse Method: Arts development officer: Energy manager: Environmental education officer Method: Radiographer, therapeutic: Radio broadcast assistant (simulated) Vision Baseline Vision/History: (unknown) Patient Visual Report: (unknown) Vision Assessment?: Vision impaired- to be further tested in functional context Perception  Perception: Impaired Inattention/Neglect: Does not attend to right side of body;Does not attend to right visual field Body Part Identification: Does not attend to R UE with transfers Praxis Praxis: Impaired Praxis Impairment Details: Ideomotor;Motor planning;Ideation Cognition Overall Cognitive Status: No family/caregiver present to determine baseline cognitive functioning Arousal/Alertness: Awake/alert Orientation Level: (Unable to assess due to aphasia) Attention: Sustained Focused Attention: Appears intact Sustained Attention: Appears intact Selective Attention: Impaired Memory: Impaired Awareness: Impaired Problem Solving: Impaired Safety/Judgment: Impaired Comments: unable to fully assess cognition due to aphasia  Sensation Sensation Light Touch: Impaired Detail Light Touch Impaired Details: Impaired RUE(Does not  react to aversive stimuli below deltoid region ) Coordination Gross Motor Movements are Fluid and Coordinated: No Fine Motor Movements are Fluid and Coordinated: No Coordination and Movement Description: R hemiparesis UE>LE (RUE dense) Motor  Motor Motor: Hemiplegia;Abnormal tone;Clonus Motor - Discharge Observations: R hemi UE>LE, clonus in R UE/LE  Mobility  Bed Mobility Bed Mobility: Supine to Sit;Sit to Supine Rolling Right: Supervision/verbal cueing Rolling Left: Supervision/Verbal cueing Supine to Sit: Supervision/Verbal cueing Sit to Supine: Supervision/Verbal cueing Transfers Sit to Stand: Minimal Assistance - Patient > 75% Stand to Sit: Minimal Assistance - Patient > 75%  Trunk/Postural Assessment  Cervical Assessment Cervical Assessment: Within Functional Limits Thoracic Assessment Thoracic Assessment: Within Functional Limits Lumbar Assessment Lumbar Assessment: Within Functional Limits Postural Control Postural Control: Deficits on evaluation Protective Responses: delayed an insufficient  Balance Balance Balance Assessed: Yes Dynamic Sitting Balance Dynamic Sitting - Balance Support: Feet supported;No upper extremity supported;During functional activity Dynamic Sitting - Level of Assistance: 5: Stand by assistance(LB dressing) Dynamic Standing Balance Dynamic Standing - Balance Support: Right upper extremity supported;During functional activity Dynamic Standing - Level of Assistance: 4: Min assist(Elevating pants post toileting) Extremity/Trunk Assessment RUE Assessment RUE Assessment: Exceptions to [facility] RUE Body System: Neuro Brunstrum levels for arm and hand: Arm;Hand Brunstrum level for arm: Stage I Presynergy Brunstrum level for hand: Stage I Flaccidity(clonus) LUE Assessment LUE Assessment: Within Functional Limits   Skipper Dacosta A Sharalee Witman 01/16/2018, 12:15 PM

## 2018-01-17 DIAGNOSIS — G8101 Flaccid hemiplegia affecting right dominant side: Secondary | ICD-10-CM

## 2018-01-17 NOTE — Progress Notes (Signed)
[place] PHYSICAL MEDICINE & REHABILITATION PROGRESS NOTE   Subjective/Complaints: Remains a phasic, discussed with nursing no new issues,  ROS: limited due to language/communication   Objective:   No results found. No results for input(s): WBC, HGB, HCT, PLT in the last 72 hours. No results for input(s): NA, K, CL, CO2, GLUCOSE, BUN, CREATININE, CALCIUM in the last 72 hours.  Intake/Output Summary (Last 24 hours) at 01/17/2018 1149 Last data filed at 01/17/2018 1043 Gross per 24 hour  Intake 1045 ml  Output -  Net 1045 ml     Physical Exam: Vital Signs Blood pressure (!) 132/94, pulse 82, temperature 98.3 F (36.8 C), resp. rate 18, height 5\' 1"  (1.549 m), weight 67.3 kg, SpO2 98 %. Constitutional: No distress . Vital signs reviewed. HEENT: EOMI, oral membranes moist Neck: supple Cardiovascular: RRR without murmur. No JVD    Respiratory: CTA Bilaterally without wheezes or rales. Normal effort    GI: BS +, non-tender, non-distended  Neurological:remains alert Aphasic  Motor strength is 0/5 RUE and RLE.  LUE and LLE grossly 4+ to 5/5   Sensation winces to pinch on left side as well as in right lower extremity, no reaction to pinch right upper extremity---stable 3-4 beats of clonus Right ankle, DTR's 3+ on right      Assessment/Plan: 1. Functional deficits secondary to large left MCA infarct which require 3+ hours per day of interdisciplinary therapy in a comprehensive inpatient rehab setting.  Physiatrist is providing close team supervision and 24 hour management of active medical problems listed below.  Physiatrist and rehab team continue to assess barriers to discharge/monitor patient progress toward functional and medical goals  Care Tool:  Bathing    Body parts bathed by patient: Chest, Abdomen, Front perineal area, Buttocks, Right upper leg, Left upper leg, Right lower leg, Left lower leg, Face   Body parts bathed by helper: Right arm, Left arm      Bathing assist Assist Level: Minimal Assistance - Patient > 75%     Upper Body Dressing/Undressing Upper body dressing   What is the patient wearing?: Pull over shirt    Upper body assist Assist Level: Minimal Assistance - Patient > 75%    Lower Body Dressing/Undressing Lower body dressing      What is the patient wearing?: Pants     Lower body assist Assist for lower body dressing: Minimal Assistance - Patient > 75%     Toileting Toileting    Toileting assist Assist for toileting: Contact Guard/Touching assist     Transfers Chair/bed transfer  Transfers assist     Chair/bed transfer assist level: Minimal Assistance - Patient > 75%     Locomotion Ambulation   Ambulation assist      Assist level: Minimal Assistance - Patient > 75% Assistive device: Hand held assist Max distance: 150   Walk 10 feet activity   Assist     Assist level: Minimal Assistance - Patient > 75% Assistive device: Hand held assist   Walk 50 feet activity   Assist    Assist level: Minimal Assistance - Patient > 75% Assistive device: Hand held assist    Walk 150 feet activity   Assist Walk 150 feet activity did not occur: Safety/medical concerns  Assist level: Minimal Assistance - Patient > 75% Assistive device: Hand held assist    Walk 10 feet on uneven surface  activity   Assist Walk 10 feet on uneven surfaces activity did not occur: Safety/medical concerns  Wheelchair     Assist Will patient use wheelchair at discharge?: Yes Type of Wheelchair: Manual Wheelchair activity did not occur: Safety/medical concerns  Wheelchair assist level: Moderate Assistance - Patient 50 - 74% Max wheelchair distance: 80    Wheelchair 50 feet with 2 turns activity    Assist    Wheelchair 50 feet with 2 turns activity did not occur: Safety/medical concerns   Assist Level: Moderate Assistance - Patient 50 - 74%   Wheelchair 150 feet activity      Assist Wheelchair 150 feet activity did not occur: Safety/medical concerns        Medical Problem List and Plan: 1. secondary to acute large left MCA infarction with large hemorrhagic transformation with uncal herniation status post hemicraniotomy 11/28/2017 for evacuation of hematoma and increased ICP due to left MCA occlusion  -for SNF dis charge, delayed secondary to complex social situation planning on discharge 01/19/2018 to skilled nursing facility  -follow up with me in about 4-6 weeks 2. DVT Prophylaxis/Anticoagulation: Subcutaneous heparin 12/15/2017. Venous Doppler studies negative 3. Pain Management:Tylenol as needed 4. Mood/history of ZOX:WRUEAVW had been on Adderall prior to admission.   5. Neuropsych: This patientis notyet capable of making decisions on herown behalf. 6. Skin/Wound Care:Routine skin checks, nutrition 7. Fluids/Electrolytes/Nutrition:  supervision for initiation when eating most likely       8.Seizure prophylaxis. 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 Decannulated 11/1---occlusive dressing--area closing 10.Dysphagia. Dysphagia #1 thin liquid diet. PEG tube 12/08/2017.   -off TF but  continue H20 flushes (decreased to 100cc BID) 11.Hypertension. Norvasc 10 mg daily, labetalol 300 mg 3 times daily. Monitor with increased mobility Vitals:   01/16/18 1934 01/17/18 0547  BP: (!) 134/94 (!) 132/94  Pulse: 90 82  Resp: 18 18  Temp:  98.3 F (36.8 C)  SpO2: 100% 98%  Mild diastolic elevation otherwise well controlled 12. Acute blood loss anemia.   -hgb hovering around 10.7-11    LOS: 9 days A FACE TO FACE EVALUATION WAS PERFORMED  Erick Colace 01/17/2018, 11:49 AM

## 2018-01-18 NOTE — Progress Notes (Signed)
[place] PHYSICAL MEDICINE & REHABILITATION PROGRESS NOTE   Subjective/Complaints: Aphasic, discussed with nursing no new issues,  ROS: limited due to language/communication   Objective:   No results found. No results for input(s): WBC, HGB, HCT, PLT in the last 72 hours. No results for input(s): NA, K, CL, CO2, GLUCOSE, BUN, CREATININE, CALCIUM in the last 72 hours.  Intake/Output Summary (Last 24 hours) at 01/18/2018 1040 Last data filed at 01/18/2018 0910 Gross per 24 hour  Intake 1125 ml  Output -  Net 1125 ml     Physical Exam: Vital Signs Blood pressure 123/79, pulse 85, temperature 98 F (36.7 C), temperature source Oral, resp. rate 18, height 5\' 1"  (1.549 m), weight 67.3 kg, SpO2 97 %. Constitutional: No distress . Vital signs reviewed. HEENT: EOMI, oral membranes moist Neck: supple Cardiovascular: RRR without murmur. No JVD    Respiratory: CTA Bilaterally without wheezes or rales. Normal effort    GI: BS +, non-tender, non-distended  Neurological:remains alert Aphasic  Motor strength is 0/5 RUE and RLE.  LUE and LLE grossly 4+ to 5/5   Sensation winces to pinch on left side as well as in right lower extremity, no reaction to pinch right upper extremity---stable 3-4 beats of clonus Right ankle, DTR's 3+ on right      Assessment/Plan: 1. Functional deficits secondary to large left MCA infarct which require 3+ hours per day of interdisciplinary therapy in a comprehensive inpatient rehab setting.  Physiatrist is providing close team supervision and 24 hour management of active medical problems listed below.  Physiatrist and rehab team continue to assess barriers to discharge/monitor patient progress toward functional and medical goals  Care Tool:  Bathing    Body parts bathed by patient: Chest, Abdomen, Front perineal area, Buttocks, Right upper leg, Left upper leg, Right lower leg, Left lower leg, Face   Body parts bathed by helper: Right arm, Left arm      Bathing assist Assist Level: Minimal Assistance - Patient > 75%     Upper Body Dressing/Undressing Upper body dressing   What is the patient wearing?: Pull over shirt    Upper body assist Assist Level: Minimal Assistance - Patient > 75%    Lower Body Dressing/Undressing Lower body dressing      What is the patient wearing?: Pants     Lower body assist Assist for lower body dressing: Minimal Assistance - Patient > 75%     Toileting Toileting    Toileting assist Assist for toileting: Contact Guard/Touching assist     Transfers Chair/bed transfer  Transfers assist     Chair/bed transfer assist level: Minimal Assistance - Patient > 75%     Locomotion Ambulation   Ambulation assist      Assist level: Minimal Assistance - Patient > 75% Assistive device: Hand held assist Max distance: 150   Walk 10 feet activity   Assist     Assist level: Minimal Assistance - Patient > 75% Assistive device: Hand held assist   Walk 50 feet activity   Assist    Assist level: Minimal Assistance - Patient > 75% Assistive device: Hand held assist    Walk 150 feet activity   Assist Walk 150 feet activity did not occur: Safety/medical concerns  Assist level: Minimal Assistance - Patient > 75% Assistive device: Hand held assist    Walk 10 feet on uneven surface  activity   Assist Walk 10 feet on uneven surfaces activity did not occur: Safety/medical concerns  Wheelchair     Assist Will patient use wheelchair at discharge?: Yes Type of Wheelchair: Manual Wheelchair activity did not occur: Safety/medical concerns  Wheelchair assist level: Moderate Assistance - Patient 50 - 74% Max wheelchair distance: 80    Wheelchair 50 feet with 2 turns activity    Assist    Wheelchair 50 feet with 2 turns activity did not occur: Safety/medical concerns   Assist Level: Moderate Assistance - Patient 50 - 74%   Wheelchair 150 feet activity      Assist Wheelchair 150 feet activity did not occur: Safety/medical concerns        Medical Problem List and Plan: 1. 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 DVT Prophylaxis/Anticoagulation: Subcutaneous heparin 12/15/2017. Venous Doppler studies negative 3. Pain Management:Tylenol as needed 4. Mood/history of QIO:NGEXBMW had been on Adderall prior to admission.   5. Neuropsych: This patientis notyet capable of making decisions on herown behalf. 6. Skin/Wound Care:Routine skin checks, nutrition 7. Fluids/Electrolytes/Nutrition:  supervision for initiation when eating most likely       8.Seizure prophylaxis. 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 Decannulated 11/1---occlusive dressing--area closing 10.Dysphagia. Dysphagia #1 thin liquid diet. PEG tube 12/08/2017.   -off TF but  continue H20 flushes (decreased to 100cc BID) 11.Hypertension. Norvasc 10 mg daily, labetalol 300 mg 3 times daily. Monitor with increased mobility Vitals:   01/17/18 2206 01/18/18 0332  BP: 125/86 123/79  Pulse: (!) 103 85  Resp:  18  Temp:  98 F (36.7 C)  SpO2:  97%  controlled 01/18/2018 12. Acute blood loss anemia.   -hgb hovering around 10.7-11    LOS: 10 days A FACE TO FACE EVALUATION WAS PERFORMED  Erick Colace 01/18/2018, 10:40 AM

## 2018-01-19 NOTE — Progress Notes (Addendum)
Social Work Patient ID: Tammie Miles, female   DOB: 12-17-1975, 42 y.o.   MRN: [id number]   Son again did not come to the hospital as he had promised to do today.  Sent multiple messages to him and informed him that would consider pt "abandoned" if he could not fulfill the request to assist with mother's admission to SNF today.  He finally responded and stated, "Go ahead and do that.. I don't want to deal with this.  I have my own life and this is complicating my stuff."  Son, also, asked that I ask either Cherly Anderson (father of pt's 96 y.o daughter) or his father, Mickel Duhamel, be asked to complete the admission process for pt.  A visitor was in this afternoon, Mr. Joanie Coddington, who stated he was the brother of the father and pt's 20 yo daughter and was here  "just for support".  He added that pt has no relationship with any of her family.  He expressed concern that we might try to d/c her home alone and I explained that this would not be a safe dc plan and we are working to secure placement.   Anticipate that we will need to begin process for guardianship per the county.  Have alerted tx team and requested that therapies re-start with the cancellation of her d/c today.  Brodie Scovell, LCSW

## 2018-01-19 NOTE — Progress Notes (Signed)
[place] PHYSICAL MEDICINE & REHABILITATION PROGRESS NOTE   Subjective/Complaints: No new problems per RN  ROS: limited due to language/communication   Objective:   No results found. No results for input(s): WBC, HGB, HCT, PLT in the last 72 hours. No results for input(s): NA, K, CL, CO2, GLUCOSE, BUN, CREATININE, CALCIUM in the last 72 hours.  Intake/Output Summary (Last 24 hours) at 01/19/2018 0850 Last data filed at 01/19/2018 0818 Gross per 24 hour  Intake 540 ml  Output -  Net 540 ml     Physical Exam: Vital Signs Blood pressure (!) 146/95, pulse 86, temperature 99 F (37.2 C), temperature source Oral, resp. rate 18, height 5\' 1"  (1.549 m), weight 67.3 kg, SpO2 95 %. Constitutional: No distress . Vital signs reviewed. HEENT: EOMI, oral membranes moist Neck: supple Cardiovascular: RRR without murmur. No JVD    Respiratory: CTA Bilaterally without wheezes or rales. Normal effort    GI: BS +, non-tender, non-distended  Neurological:remains alert Aphasic  Motor strength is 0/5 RUE and RLE.  LUE and LLE grossly 4+ to 5/5   Sensation winces to pinch on left side as well as in right lower extremity, no reaction to pinch right upper extremity---stable 3-4 beats of clonus Right ankle, DTR's 3+ on right      Assessment/Plan: 1. Functional deficits secondary to large left MCA infarct which require 3+ hours per day of interdisciplinary therapy in a comprehensive inpatient rehab setting.  Physiatrist is providing close team supervision and 24 hour management of active medical problems listed below.  Physiatrist and rehab team continue to assess barriers to discharge/monitor patient progress toward functional and medical goals  Care Tool:  Bathing    Body parts bathed by patient: Chest, Abdomen, Front perineal area, Buttocks, Right upper leg, Left upper leg, Right lower leg, Left lower leg, Face   Body parts bathed by helper: Right arm, Left arm     Bathing assist  Assist Level: Minimal Assistance - Patient > 75%     Upper Body Dressing/Undressing Upper body dressing   What is the patient wearing?: Pull over shirt    Upper body assist Assist Level: Minimal Assistance - Patient > 75%    Lower Body Dressing/Undressing Lower body dressing      What is the patient wearing?: Pants     Lower body assist Assist for lower body dressing: Minimal Assistance - Patient > 75%     Toileting Toileting    Toileting assist Assist for toileting: Contact Guard/Touching assist     Transfers Chair/bed transfer  Transfers assist     Chair/bed transfer assist level: Minimal Assistance - Patient > 75%     Locomotion Ambulation   Ambulation assist      Assist level: Minimal Assistance - Patient > 75% Assistive device: Hand held assist Max distance: 150   Walk 10 feet activity   Assist     Assist level: Minimal Assistance - Patient > 75% Assistive device: Hand held assist   Walk 50 feet activity   Assist    Assist level: Minimal Assistance - Patient > 75% Assistive device: Hand held assist    Walk 150 feet activity   Assist Walk 150 feet activity did not occur: Safety/medical concerns  Assist level: Minimal Assistance - Patient > 75% Assistive device: Hand held assist    Walk 10 feet on uneven surface  activity   Assist Walk 10 feet on uneven surfaces activity did not occur: Safety/medical concerns  Wheelchair     Assist Will patient use wheelchair at discharge?: Yes Type of Wheelchair: Manual Wheelchair activity did not occur: Safety/medical concerns  Wheelchair assist level: Moderate Assistance - Patient 50 - 74% Max wheelchair distance: 80    Wheelchair 50 feet with 2 turns activity    Assist    Wheelchair 50 feet with 2 turns activity did not occur: Safety/medical concerns   Assist Level: Moderate Assistance - Patient 50 - 74%   Wheelchair 150 feet activity     Assist Wheelchair  150 feet activity did not occur: Safety/medical concerns        Medical Problem List and Plan: 1. secondary to acute large left MCA infarction with large hemorrhagic transformation with uncal herniation status post hemicraniotomy 11/28/2017 for evacuation of hematoma and increased ICP due to left MCA occlusion  -DC to SNF today  -follow up with me in about 4-6 weeks 2. DVT Prophylaxis/Anticoagulation: Subcutaneous heparin 12/15/2017. Venous Doppler studies negative 3. Pain Management:Tylenol as needed 4. Mood/history of DUK:GURKYHC had been on Adderall prior to admission.   5. Neuropsych: This patientis notyet capable of making decisions on herown behalf. 6. Skin/Wound Care:Routine skin checks, nutrition 7. Fluids/Electrolytes/Nutrition:  supervision for initiation when eating most likely       8.Seizure prophylaxis. 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 Decannulated 11/1---occlusive dressing--area closing 10.Dysphagia. Dysphagia #1 thin liquid diet. PEG tube 12/08/2017.   -off TF but  continue H20 flushes (decreased to 100cc BID) 11.Hypertension. Norvasc 10 mg daily, labetalol 300 mg 3 times daily. Monitor with increased mobility Vitals:   01/18/18 2038 01/19/18 0531  BP: 138/89 (!) 146/95  Pulse: 88 86  Resp: 18 18  Temp: 98.9 F (37.2 C) 99 F (37.2 C)  SpO2: 96% 95%  borderline controlled 01/19/2018 12. Acute blood loss anemia.   -hgb hovering around 10.7-11    LOS: 11 days A FACE TO FACE EVALUATION WAS PERFORMED  Ranelle Oyster 01/19/2018, 8:50 AM

## 2018-01-19 NOTE — Progress Notes (Signed)
Nutrition Follow-up  DOCUMENTATION CODES:   Not applicable  INTERVENTION:   Continue Ensure Enlive TID (each supplement provides 350 kcal and 20 grams of protein) Continue liquid MVI and thiamine. Continue psyllium.  NUTRITION DIAGNOSIS:   Inadequate oral intake related to dysphagia as evidenced by other (comment)(per RN report).  Ongoing  GOAL:   Patient will meet greater than or equal to 90% of their needs  Not consistently meeting  MONITOR:   PO intake, TF tolerance, Skin, Labs, Supplement acceptance, Diet advancement, Weight trends   PO intake: 40-100% of last 5 meals/snacks completed, per nsg documentation TF tolerance: good, continue as ordered Skin: no change Labs: Hgb 10.7 Supplement acceptance: Diet advancement: Dysphagia I, thin liquids Wt trends: no new wt since 11/1, ordered new wt today  ASSESSMENT:   Pt with PMH of HTN with prescribed stimulant use admitted with large L hemorrhagic basal ganglia with 15 mm shift with early herniation s/p L frontotemporal craniotomy for evacuation of hematoma.   11/11 follow up:  Meds: pepcid 20 mg BID, Ensure Enlive TID, MVI liquid, psyillium 1 pkt BID, thiamine 100 mg daily  Pt resting with family present at time of visit.  Pt denies nausea, diarrhea, or constipation. Noted last BM 11/3.   Per nsg, pt no longer receiving bolus feeds via PEG. Continue Ensure Enlive TID.  Diet Order:   Diet Order            DIET - DYS 1 Room service appropriate? Yes; Fluid consistency: Thin  Diet effective now              EDUCATION NEEDS:  Not appropriate for education at this time  Skin:  Skin Assessment: Skin Integrity Issues: Skin Integrity Issues:: Stage II Stage II: coccyx Incisions: L head, abdomen  Last BM:  11/3 (large type 1)  Height:  Ht Readings from Last 1 Encounters:  01/09/18 5\' 1"  (1.549 m)    Weight:  Wt Readings from Last 1 Encounters:  01/09/18 67.3 kg    Ideal Body Weight:  47.7  kg  BMI:  Body mass index is 28.03 kg/m.  Estimated Nutritional Needs:   Kcal:  1700-1900  Protein:  85-100 grams  Fluid:  1.7-1.9 L  Jolaine Artist, MS, RDN, LDN On-call pager: [phone]

## 2018-01-19 NOTE — Plan of Care (Signed)
  Problem: Consults Goal: RH STROKE PATIENT EDUCATION Description See Patient Education module for education specifics  Outcome: Progressing   Problem: RH BOWEL ELIMINATION Goal: RH STG MANAGE BOWEL WITH ASSISTANCE Description STG Manage Bowel with mod Assistance.  Outcome: Progressing Goal: RH STG MANAGE BOWEL W/MEDICATION W/ASSISTANCE Description STG Manage Bowel with Medication with mod Assistance.  Outcome: Progressing   Problem: RH BLADDER ELIMINATION Goal: RH STG MANAGE BLADDER WITH ASSISTANCE Description STG Manage Bladder With mod. Assistance  Outcome: Progressing   Problem: RH SKIN INTEGRITY Goal: RH STG SKIN FREE OF INFECTION/BREAKDOWN Description With mod. Assistance.  Outcome: Progressing Goal: RH STG MAINTAIN SKIN INTEGRITY WITH ASSISTANCE Description STG Maintain Skin Integrity With min Assistance.  Outcome: Progressing Goal: RH STG ABLE TO PERFORM INCISION/WOUND CARE W/ASSISTANCE Description STG Able To Perform Incision/Wound Care With min.Assistance.  Outcome: Not Progressing   Problem: RH SAFETY Goal: RH STG ADHERE TO SAFETY PRECAUTIONS W/ASSISTANCE/DEVICE Description STG Adhere to Safety Precautions With mod. Assistance/Device.  Outcome: Progressing Goal: RH STG DECREASED RISK OF FALL WITH ASSISTANCE Description STG Decreased Risk of Fall With mod Assistance.  Outcome: Progressing   Problem: RH PAIN MANAGEMENT Goal: RH STG PAIN MANAGED AT OR BELOW PT'S PAIN GOAL Description < 2 on faces pain scale  Outcome: Progressing   Problem: RH KNOWLEDGE DEFICIT Goal: RH STG INCREASE KNOWLEDGE OF HYPERTENSION Description With mod. assistance  Outcome: Not Applicable Goal: RH STG INCREASE KNOWLEDGE OF DYSPHAGIA/FLUID INTAKE Description With mod. assistance  Outcome: Progressing Goal: RH STG INCREASE KNOWLEDGE OF STROKE PROPHYLAXIS Description With mod. Assistance.  Outcome: Not Applicable   Problem: Spiritual Needs Goal: Ability to function at  adequate level Outcome: Progressing

## 2018-01-20 ENCOUNTER — Inpatient Hospital Stay (HOSPITAL_COMMUNITY): Payer: Self-pay

## 2018-01-20 ENCOUNTER — Inpatient Hospital Stay (HOSPITAL_COMMUNITY): Payer: Self-pay | Admitting: Occupational Therapy

## 2018-01-20 NOTE — Progress Notes (Signed)
[place] PHYSICAL MEDICINE & REHABILITATION PROGRESS NOTE   Subjective/Complaints: Pt rested well.   No new issues  Objective:   No results found. No results for input(s): WBC, HGB, HCT, PLT in the last 72 hours. No results for input(s): NA, K, CL, CO2, GLUCOSE, BUN, CREATININE, CALCIUM in the last 72 hours.  Intake/Output Summary (Last 24 hours) at 01/20/2018 0853 Last data filed at 01/19/2018 1800 Gross per 24 hour  Intake 870 ml  Output -  Net 870 ml     Physical Exam: Vital Signs Blood pressure (!) 128/93, pulse 91, temperature 98.4 F (36.9 C), temperature source Oral, resp. rate 18, height 5\' 1"  (1.549 m), weight 65 kg, SpO2 94 %. Constitutional: No distress . Vital signs reviewed. HEENT: EOMI, oral membranes moist Neck: supple Cardiovascular: RRR without murmur. No JVD    Respiratory: CTA Bilaterally without wheezes or rales. Normal effort    GI: BS +, non-tender, non-distended  Neurological:remains alert Remains Aphasic  Motor strength is 0/5 RUE and RLE.  LUE and LLE grossly 4+ to 5/5   Sensation winces to pinch on left side as well as in right lower extremity, no reaction to pinch right upper extremity---stable 3-4 beats of clonus Right ankle, DTR's 3+ on right      Assessment/Plan: 1. Functional deficits secondary to large left MCA infarct which require 3+ hours per day of interdisciplinary therapy in a comprehensive inpatient rehab setting.  Physiatrist is providing close team supervision and 24 hour management of active medical problems listed below.  Physiatrist and rehab team continue to assess barriers to discharge/monitor patient progress toward functional and medical goals  Care Tool:  Bathing    Body parts bathed by patient: Chest, Abdomen, Front perineal area, Buttocks, Right upper leg, Left upper leg, Right lower leg, Left lower leg, Face   Body parts bathed by helper: Right arm, Left arm     Bathing assist Assist Level: Minimal  Assistance - Patient > 75%     Upper Body Dressing/Undressing Upper body dressing   What is the patient wearing?: Pull over shirt    Upper body assist Assist Level: Minimal Assistance - Patient > 75%    Lower Body Dressing/Undressing Lower body dressing      What is the patient wearing?: Pants     Lower body assist Assist for lower body dressing: Minimal Assistance - Patient > 75%     Toileting Toileting    Toileting assist Assist for toileting: Contact Guard/Touching assist     Transfers Chair/bed transfer  Transfers assist     Chair/bed transfer assist level: Minimal Assistance - Patient > 75%     Locomotion Ambulation   Ambulation assist      Assist level: Minimal Assistance - Patient > 75% Assistive device: Hand held assist Max distance: 150   Walk 10 feet activity   Assist     Assist level: Minimal Assistance - Patient > 75% Assistive device: Hand held assist   Walk 50 feet activity   Assist    Assist level: Minimal Assistance - Patient > 75% Assistive device: Hand held assist    Walk 150 feet activity   Assist Walk 150 feet activity did not occur: Safety/medical concerns  Assist level: Minimal Assistance - Patient > 75% Assistive device: Hand held assist    Walk 10 feet on uneven surface  activity   Assist Walk 10 feet on uneven surfaces activity did not occur: Safety/medical concerns  Wheelchair     Assist Will patient use wheelchair at discharge?: Yes Type of Wheelchair: Manual Wheelchair activity did not occur: Safety/medical concerns  Wheelchair assist level: Moderate Assistance - Patient 50 - 74% Max wheelchair distance: 80    Wheelchair 50 feet with 2 turns activity    Assist    Wheelchair 50 feet with 2 turns activity did not occur: Safety/medical concerns   Assist Level: Moderate Assistance - Patient 50 - 74%   Wheelchair 150 feet activity     Assist Wheelchair 150 feet activity did  not occur: Safety/medical concerns        Medical Problem List and Plan: 1. 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 DVT Prophylaxis/Anticoagulation: Subcutaneous heparin 12/15/2017. Venous Doppler studies negative 3. Pain Management:Tylenol as needed 4. Mood/history of ZOX:[id number]:Patient had been on Adderall prior to admission.   5. Neuropsych: This patientis notyet capable of making decisions on herown behalf. 6. Skin/Wound Care:Routine skin checks, nutrition 7. Fluids/Electrolytes/Nutrition:  supervision for initiation when eating most likely       8.Seizure prophylaxis. 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 Decannulated 11/1---occlusive dressing--area closing 10.Dysphagia. Dysphagia #1 thin liquid diet. PEG tube 12/08/2017.   -off TF but  continue H20 flushes (decreased to 100cc BID) 11.Hypertension. Norvasc 10 mg daily, labetalol 300 mg 3 times daily. Monitor with increased mobility Vitals:   01/19/18 1607 01/19/18 2002  BP: 128/85 (!) 128/93  Pulse: 85 91  Resp: 18 18  Temp: 98 F (36.7 C) 98.4 F (36.9 C)  SpO2: 98% 94%  borderline controlled 01/20/2018 12. Acute blood loss anemia.   -hgb hovering around 10.7-11    LOS: 12 days A FACE TO FACE EVALUATION WAS PERFORMED  Ranelle OysterZachary T Swartz 01/20/2018, 8:53 AM

## 2018-01-20 NOTE — Progress Notes (Signed)
Nursing Report called to [place], RN @ [facility].

## 2018-01-20 NOTE — Progress Notes (Signed)
Patient is scheduled for Discharge to [facility] SNF today. Upon shift assessment Patient is alert. Aphasic. Able to nod head yes or no appropriately at times. No facial grimacing or guarding noted at present. Resp. even and unlabored. PEG Tube patent and intact; flushes without difficulty.  No s/s acute distress noted at present. Patient Discharged to SNF with personal belongings. Patient transported by EMS via stretcher accompanied by x 2 attendants.

## 2018-01-20 NOTE — Progress Notes (Addendum)
Social Work Patient ID: Urban GibsonKelly [name], female   DOB: Nov 30, 1975, 42 y.o.   MRN: [id number]   As noted in previous note, pt's son, Turner DanielsRonta Matthews, gave permission yesterday for me to reach out to both Cherly AndersonDana Moore (father of pt's daughter) or Ronta's own father, Mickel DuhamelRodney Matthews to see if either would be willing to assist with SNF admission of pt.   Mr. Christell ConstantMoore actually contacted the unit today and arrived a short time ago and is in agreement with assisting with the admission.  Have contacted the admission coordinator at [facility] who confirms that she can still offer bed with LOG.  Have requested the LOG be updated and sent to facility.  Have alerted MD and team that pt to d/c this afternoon to SNF with Mr. Christell ConstantMoore assisting with process.  Kyrin Gratz, LCSW

## 2018-01-20 NOTE — Progress Notes (Signed)
[name]' Daughter's Father at bedside questioning about the location of her Social Security Card and [employer] Card. Unit Secretary contacted Security Department to inquire if possessions were being stored. Per C[employer] managerecurity personnel no possessions are being stored for [name].

## 2018-01-20 NOTE — Progress Notes (Signed)
Social Work  Discharge Note  The overall goal for the admission was met for:   Discharge location: Yes - plan upon admit to CIR was for SNF  Length of Stay: Yes - 12 days  Discharge activity level: No - d/c'd to SNF at min assist  Home/community participation: No  Services provided included: MD, RD, PT, OT, SLP, RN, Pharmacy and SW  Financial Services:  Medicaid and SSD applications pending still at time of d/c.  Follow-up services arranged: Other: SNF at [facility]  Comments (or additional information):  Patient/Family verbalized understanding of follow-up arrangements: Yes  Individual responsible for coordination of the follow-up plan: son  Confirmed correct DME delivered: NA  Tammie Miles

## 2018-01-21 ENCOUNTER — Ambulatory Visit (HOSPITAL_COMMUNITY): Payer: Self-pay | Admitting: Speech Pathology

## 2018-01-22 ENCOUNTER — Inpatient Hospital Stay (HOSPITAL_COMMUNITY): Payer: Self-pay | Admitting: Physical Therapy

## 2018-03-05 ENCOUNTER — Encounter: Payer: Self-pay | Admitting: Adult Health

## 2018-03-05 ENCOUNTER — Ambulatory Visit (INDEPENDENT_AMBULATORY_CARE_PROVIDER_SITE_OTHER): Payer: Medicaid Other | Admitting: Adult Health

## 2018-03-05 VITALS — BP 135/82 | HR 88 | Ht 61.0 in | Wt 153.6 lb

## 2018-03-05 DIAGNOSIS — R569 Unspecified convulsions: Secondary | ICD-10-CM

## 2018-03-05 DIAGNOSIS — Z9889 Other specified postprocedural states: Secondary | ICD-10-CM

## 2018-03-05 DIAGNOSIS — I611 Nontraumatic intracerebral hemorrhage in hemisphere, cortical: Secondary | ICD-10-CM

## 2018-03-05 DIAGNOSIS — G8191 Hemiplegia, unspecified affecting right dominant side: Secondary | ICD-10-CM

## 2018-03-05 DIAGNOSIS — I69320 Aphasia following cerebral infarction: Secondary | ICD-10-CM

## 2018-03-05 DIAGNOSIS — I10 Essential (primary) hypertension: Secondary | ICD-10-CM

## 2018-03-05 DIAGNOSIS — I6389 Other cerebral infarction: Secondary | ICD-10-CM | POA: Diagnosis not present

## 2018-03-05 NOTE — Patient Instructions (Signed)
Continue aspirin 81 mg daily  for secondary stroke prevention  Continue to follow up with PCP regarding cholesterol and blood pressure management   Continue keppra for seizure prevention  Continue PT/OT/ST for continued deficits  You will have an appointment scheduled with neurosurgery to discuss when your skull cap can be replaced   Continue to monitor blood pressure at home  Maintain strict control of hypertension with blood pressure goal below 130/90, diabetes with hemoglobin A1c goal below 6.5% and cholesterol with LDL cholesterol (bad cholesterol) goal below 70 mg/dL. I also advised the patient to eat a healthy diet with plenty of whole grains, cereals, fruits and vegetables, exercise regularly and maintain ideal body weight.  Followup in the future with me in 3 months or call earlier if needed       Thank you for coming to see [facility] at [facility]. I hope we have been able to provide you high quality care today.  You may receive a patient satisfaction survey over the next few weeks. We would appreciate your feedback and comments so that we may continue to improve ourselves and the health of our patients.

## 2018-03-05 NOTE — Progress Notes (Signed)
[facility] [address] [place]. [place] [id number] [phone]       OFFICE FOLLOW UP NOTE  Tammie Miles Date of Birth:  1975/05/26 Medical Record Number:  [id number]   Reason for Referral:  hospital stroke follow up  CHIEF COMPLAINT:  Chief Complaint  Patient presents with  . Hospitalization Follow-up    Aunt present. Rm 9. Her aunt stated that she has made some great progess since shes been out of the hospital.     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 History obtained from patient, aunt and chart review. Reviewed all radiology images and labs personally.  Tammie Miles is a 42 y.o. Femalewith PMH of HTN on hypertensive medications, antidepressants, stimulants, and Xanax which are all prescribed by her primary physician.  Per discharge summary notes by Dr. Pearlean BrownieSethi, after awakening from a nap on 11/26/2017 she was noted to not be acting correctly and was dropping things.  Her 42-year-old daughter called the father multiple times and when he went and checked on her he believes that she was drunk and she was obtunded but moving her arms.  The following morning on 11/27/2017, she remained obtunded and the husband called 911 where she was brought into the emergency room via EMS.  Initial CT head reviewed and showed large area of hemorrhagic left basal ganglia and left frontal lobe was noted with a 5.8 x 3.5 cm along with 15 mm shift from the left to right with early herniation.  Neurosurgery was immediately called for decompression.  Initial hematoma evacuation performed and after neuro worsening, hemicraniectomy was performed a few days later.  Initial insult felt to be ischemic with large hemorrhagic  transformation and continued bleeding.  Unfortunately, patient had a prolonged hospitalization due to lack of family support and insurance for rehab.  Patient was started on Keppra for seizure prevention after episode of stiffness and tongue biting on 12/03/2017.  EEG did not show seizure activity.  Due to acute respiratory insufficiency, patient was intubated and did undergo tracheostomy on 12/09/2017.  Blood pressure initially found to be elevated on admission but stabilized throughout hospitalization.  Due to continued dysphasia, PEG tube placed on 12/09/2017 for nutritional support.  Aspirin 81 mg initiated for secondary stroke prevention.  Therapies recommended SNF but due to lack of bed placement, patient was transferred to CIR for ongoing PT/OT/ST.  During CIR stay, patient was decannulated on 01/09/2018.  Due to continued therapy needs, [facility] on [facility]/2018.  Patient is being seen today for hospital follow-up and is accompanied by her aunt.  [facility]  She continues to receive PT/OT/ST. she continues to have LUE flaccid and expressive > receptive aphasia.  Severe expressive aphasia present with only being able to say "no" but is able to nod her head appropriately.  Mild to moderate receptive aphasia present.  She has had PEG tube removed and is currently obtaining all nutrition and pills orally.  She denies any complication in doing so and denies any aspiration or coughing with eating.  She is able to ambulate without assistance and denies any recent falls.  She needs minimal assistance with ADLs such  as bathing and dressing.  She does feel as though she has been improving.  Continues on aspirin 81 mg without side effects of bleeding or bruising.  She continues on Keppra without recurrent seizures and has been tolerating well.  Blood pressure today satisfactory 135/82.  She denies  follow-up appointment with neurosurgery since hospital discharge as recommended.  She does have skullcap placed right side of abdomen.  No further concerns at this time.  Denies new or worsening stroke/TIA symptoms.     ROS:   14 system review of systems performed and negative with exception of speech difficulty, weakness  PMH:  Past Medical History:  Diagnosis Date  . ADHD   . Anxiety   . Depression   . Hypertension     PSH:  Past Surgical History:  Procedure Laterality Date  . CRANIOTOMY Left 11/27/2017   Procedure: CRANIOTOMY for Evacuation of Intracranial Hemorrhage;  Surgeon: Lisbeth Renshaw, MD;  Location: [facility] OR;  Service: Neurosurgery;  Laterality: Left;  CRANIOTOMY for Evacuation of Intracranial Hemorrhage  . CRANIOTOMY Left 11/28/2017   Procedure: Decompressive craniectomy;  Surgeon: Maeola Harman, MD;  Location: [facility] OR;  Service: Neurosurgery;  Laterality: Left;  . ESOPHAGOGASTRODUODENOSCOPY N/A 12/09/2017   Procedure: ESOPHAGOGASTRODUODENOSCOPY (EGD);  Surgeon: Violeta Gelinas, MD;  Location: [facility] ENDOSCOPY;  Service: General;  Laterality: N/A;  . PEG PLACEMENT N/A 12/09/2017   Procedure: PERCUTANEOUS ENDOSCOPIC GASTROSTOMY (PEG) PLACEMENT;  Surgeon: Violeta Gelinas, MD;  Location: [facility] ENDOSCOPY;  Service: General;  Laterality: N/A;    Social History:  Social History   Socioeconomic History  . Marital status: Married    Spouse name: Not on file  . Number of children: 1  . Years of education: Not on file  . Highest education level: Not on file  Occupational History  . Not on file  Social Needs  . Financial resource strain: Not on file  . Food insecurity:    Worry: Not on file    Inability: Not on file  . Transportation needs:    Medical: Not on file    Non-medical: Not on file  Tobacco Use  . Smoking status: Unknown If Ever Smoked  Substance and Sexual Activity  . Alcohol use: Yes  . Drug use: Not on file  . Sexual activity: Yes  Lifestyle  . Physical  activity:    Days per week: Not on file    Minutes per session: Not on file  . Stress: Not on file  Relationships  . Social connections:    Talks on phone: Not on file    Gets together: Not on file    Attends religious service: Not on file    Active member of club or organization: Not on file    Attends meetings of clubs or organizations: Not on file    Relationship status: Not on file  . Intimate partner violence:    Fear of current or ex partner: Not on file    Emotionally abused: Not on file    Physically abused: Not on file    Forced sexual activity: Not on file  Other Topics Concern  . Not on file  Social History Narrative  . Not on file    Family History:  Family History  Problem Relation Age of Onset  . Hypertension Mother   . Hypertension Father     Medications:   Current Outpatient Medications on File Prior to Visit  Medication Sig Dispense Refill  . acetaminophen (TYLENOL) 325 MG tablet Take 2 tablets (650  mg total) by mouth every 4 (four) hours as needed for mild pain (or temp > 37.5 C (99.5 F)).    Marland Kitchen amLODipine (NORVASC) 10 MG tablet Take 1 tablet (10 mg total) by mouth daily.    Marland Kitchen aspirin 81 MG chewable tablet Place 1 tablet (81 mg total) into feeding tube daily.    Marland Kitchen docusate (COLACE) 50 MG/5ML liquid Place 10 mLs (100 mg total) into feeding tube 2 (two) times daily. 100 mL 0  . famotidine (PEPCID) 40 MG/5ML suspension Place 2.5 mLs (20 mg total) into feeding tube 2 (two) times daily. 50 mL 0  . ferrous sulfate 325 (65 FE) MG tablet Take 1 tablet (325 mg total) by mouth 2 (two) times daily.  3  . folic acid (FOLVITE) 1 MG tablet Place 1 tablet (1 mg total) into feeding tube daily.    Marland Kitchen labetalol (NORMODYNE) 300 MG tablet Take 1 tablet (300 mg total) by mouth 3 (three) times daily.    Marland Kitchen levETIRAcetam (KEPPRA) 100 MG/ML solution Place 10 mLs (1,000 mg total) into feeding tube every 12 (twelve) hours. 473 mL 12  . Multiple Vitamin (MULTIVITAMIN) LIQD Place 15 mLs  into feeding tube daily.    . psyllium (HYDROCIL/METAMUCIL) 95 % PACK Take 1 packet by mouth 2 (two) times daily. 240 each   . senna-docusate (SENOKOT-S) 8.6-50 MG tablet Take 1 tablet by mouth at bedtime as needed for mild constipation.    . Water For Irrigation, Sterile (FREE WATER) SOLN Place 150 mLs into feeding tube every 4 (four) hours.    . Water For Irrigation, Sterile (FREE WATER) SOLN Place 150 mLs into feeding tube 2 times daily at 12 noon and 4 pm.     No current facility-administered medications on file prior to visit.     Allergies:  No Known Allergies   Physical Exam  Vitals:   03/05/18 1315  BP: 135/82  Pulse: 88  Weight: 153 lb 9.6 oz (69.7 kg)  Height: 5\' 1"  (1.549 m)   Body mass index is 29.02 kg/m. No exam data present  General: well developed, well nourished, pleasant African-American female, seated, in no evident distress Head: head normocephalic and atraumatic.   Neck: supple with no carotid or supraclavicular bruits Cardiovascular: regular rate and rhythm, no murmurs Musculoskeletal: no deformity Skin:  no rash/petichiae; skull flap present RLQ with healed incision; healed incision at post PEG site and post trach site Vascular:  Normal pulses all extremities  Neurologic Exam Mental Status: Awake and fully alert.  Severe expressive aphasia but mild receptive aphasia.  Unable to assess orientation due to aphasia.  Mood and affect appropriate and cooperative with exam Cranial Nerves: Fundoscopic exam reveals sharp disc margins. Pupils equal, briskly reactive to light. Extraocular movements full without nystagmus. Visual fields full unable to assess as patient unable to follow commands appropriately. Hearing intact. Facial sensation intact. Face, tongue, palate moves normally and symmetrically.  Motor: Normal bulk and tone.  RUE: 0/5; RLE: 4+/5, unable to adequately assess ankle strength as patient unable to comprehend instructions; full strength left upper and  lower extremity Sensory.: intact to touch , pinprick , position and vibratory sensation.  Coordination: Rapid alternating movements normal in left upper extremity. Finger-to-nose performed accurately in left upper extremity and heel-to-shin performed accurately . Gait and Station: Arises from chair without difficulty. Stance is normal. Gait demonstrates hemiplegic gait but is able to ambulate without assistive device.Unble to heel, toe and tandem walk without difficulty.  Reflexes: 1+ and  symmetric. Toes downgoing.    NIHSS  9 Modified Rankin  3    Diagnostic Data (Labs, Imaging, Testing)  CT HEAD WO CONTRAST 11/27/2017 IMPRESSION: Large area of hemorrhage in the left basal ganglia and left frontal lobe, 5.8 x 3.5 cm. 15 mm of left-to-right midline shift.  CT ANGIO HEAD W OR WO CONTRAST 11/27/2017 IMPRESSION: 1. Positive for left MCA M1 large vessel occlusion, and also severe stenosis at the left ICA terminus, superimposed on the relatively large acute left hemisphere intra-axial hemorrhage (estimated blood volume 43 mL). Vasospasm suspected in the proximal ACAs. 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 2. Negative for intracranial aneurysm, CTA spot sign, or evidence of vascular malformation in association with the acute hemorrhage. 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 3. Stable intracranial mass effect since 0938 hours today, with 16 mm of leftward midline shift and trapping of the right lateral ventricle.  CT HEAD WO CONTRAST 11/27/2017 IMPRESSION: 1. Interval LEFT frontal craniotomy for basal ganglia hematoma evacuation, small amount of residual blood products and regional edema. 7 mm residual LEFT to RIGHT midline shift, resolved RIGHT ventricular entrapment. 2. Trace  RIGHT holo hemispheric and tentorial subdural hematomas. Global edema.  MR BRAIN WO CONTRAST 11/27/2017 IMPRESSION: 1. Postoperative changes from previous left frontal craniotomy for evacuation of left basal ganglia hematoma. Residual blood products within the evacuation cavity with persistent surrounding vasogenic edema and regional mass effect. Associated 8-9 mm of right-to-left midline shift with mild left uncal herniation similar to previous. No hydrocephalus or ventricular trapping. 2. Underlying evolving large acute ischemic left MCA territory infarct involving the majority of the left MCA territory. 3. Trace right cerebral and tentorial subdural hematoma without mass effect. 4. Right sphenoid sinusitis.  CT HEAD WO CONTRAST 11/28/2017 IMPRESSION: 1. Stable when compared to yesterday. 2. Left MCA territory infarct with evacuated hematoma. Midline shift measures 8 mm. 3. 2-3 mm subdural hematoma along the right cerebral convexity without interval increase.  CT HEAD WO CONTRAST 11/29/2017 IMPRESSION: 1. Large left MCA infarct is stable in distribution. 2. Interval left frontal craniectomy with mild herniation of left frontal lobe through the defect. 3. Resolved uncal herniation, improved patency of left lateral ventricle, and 6 mm left-to-right midline shift, previously 8 mm. 4. Partial interval dispersion of acute hemorrhage in the left basal ganglia. Subcentimeter density anterior to the prior position of blood products possibly representing retracted clot or interval hemorrhage, attention at follow-up recommended.  CT HEAD WO CONTRAST 12/02/2017 IMPRESSION: 1. Left MCA distribution infarction is stable in distribution. Mild increased edema of the infarct with increased herniation of the left frontal lobe via the left frontal craniectomy. 2. Stable 6 mm left-to-right midline shift and partial effacement of left lateral ventricle. 3. Stable small hematoma within the  left basal ganglia. 4. No new acute intracranial abnormality identified.    ASSESSMENT: JONATHAN KIRKENDOLL is a 42 y.o. 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  Initial insult felt to be ischemic with large hemorrhagic transformation and continued bleeding.  Hospitalization complicated by acute respiratory insufficiency status post trach with decannulation, seizure, hypertensive emergency, anemia, and dysphasia status post PEG. Vascular risk factors include HTN and  depression.  Patient is being seen today for hospital follow-up and overall is doing remarkably well with continued deficits of expressive > receptive aphasia and LUE flaccidity.    PLAN:  1. Left MCA infarct with hemorrhagic transformation status post hemicraniotomy: Continue aspirin 81 mg daily for secondary stroke prevention. Maintain strict control of hypertension with blood pressure goal below 130/90, diabetes with hemoglobin A1c goal below 6.5% and cholesterol with LDL cholesterol (bad cholesterol) goal below 70 mg/dL.  I also advised the patient to eat a healthy diet with plenty of whole grains, cereals, fruits and vegetables, exercise regularly with at least 30 minutes of continuous activity daily and maintain ideal body weight.  Advised patient to continue PT/OT/ST for continued deficits 2. HTN: Advised to continue current treatment regimen.  Today's BP 135/82.  Advised to continue to monitor at home along with continued follow-up with PCP for management 3. Seizure: Continue Keppra 1 g twice daily for seizure prophylaxis 4. Referral placed to neurosurgery for patient to follow-up as recommended during hospitalization and monitoring of skull flap and determine when scalp flap can be replaced    Follow up in 3 months  or call earlier if needed   Greater than 50% of time during this 25 minute visit was spent on counseling, explanation of diagnosis of left MCA infarct with hemorrhagic transformation status post hemicraniectomy, reviewing risk factor management of HTN and seizures, planning of further management along with potential future management, and discussion with patient and family answering all questions.    George HughJessica VanSchaick, AGNP-BC  [facility] [address] [place], [place] [id number]  Phone [phone] Fax [phone] Note: This document was prepared with digital dictation and possible smart phrase technology. Any transcriptional errors that result from this process are unintentional.

## 2018-03-09 ENCOUNTER — Encounter: Payer: Self-pay | Attending: Physical Medicine & Rehabilitation | Admitting: Physical Medicine & Rehabilitation

## 2018-03-15 NOTE — Progress Notes (Signed)
I agree with the above plan 

## 2018-03-30 ENCOUNTER — Telehealth: Payer: Self-pay | Admitting: Adult Health

## 2018-03-30 NOTE — Telephone Encounter (Signed)
[facility] has not been unable to reach patient to sch. Patient has not returned any phone calls. [phone]

## 2018-05-05 ENCOUNTER — Telehealth: Payer: Self-pay | Admitting: Physical Medicine & Rehabilitation

## 2018-05-19 ENCOUNTER — Other Ambulatory Visit: Payer: Self-pay

## 2018-05-19 ENCOUNTER — Encounter (HOSPITAL_COMMUNITY): Payer: Self-pay | Admitting: *Deleted

## 2018-05-19 ENCOUNTER — Emergency Department (HOSPITAL_COMMUNITY)
Admission: EM | Admit: 2018-05-19 | Discharge: 2018-05-20 | Disposition: A | Discharge status: Home / Self Care | Payer: Medicaid Other | Attending: Emergency Medicine | Admitting: Emergency Medicine

## 2018-05-19 ENCOUNTER — Emergency Department (HOSPITAL_COMMUNITY): Payer: Medicaid Other

## 2018-05-19 DIAGNOSIS — I10 Essential (primary) hypertension: Secondary | ICD-10-CM | POA: Diagnosis not present

## 2018-05-19 DIAGNOSIS — Z8673 Personal history of transient ischemic attack (TIA), and cerebral infarction without residual deficits: Secondary | ICD-10-CM | POA: Diagnosis not present

## 2018-05-19 DIAGNOSIS — Z79899 Other long term (current) drug therapy: Secondary | ICD-10-CM | POA: Diagnosis not present

## 2018-05-19 DIAGNOSIS — Z7982 Long term (current) use of aspirin: Secondary | ICD-10-CM | POA: Diagnosis not present

## 2018-05-19 DIAGNOSIS — R1013 Epigastric pain: Secondary | ICD-10-CM | POA: Insufficient documentation

## 2018-05-19 DIAGNOSIS — R109 Unspecified abdominal pain: Secondary | ICD-10-CM

## 2018-05-19 DIAGNOSIS — R1010 Upper abdominal pain, unspecified: Secondary | ICD-10-CM | POA: Diagnosis present

## 2018-05-19 LAB — CBC WITH DIFFERENTIAL/PLATELET
ABS IMMATURE GRANULOCYTES: 0.01 10*3/uL (ref 0.00–0.07)
Basophils Absolute: 0.1 10*3/uL (ref 0.0–0.1)
Basophils Relative: 1 %
Eosinophils Absolute: 0.3 10*3/uL (ref 0.0–0.5)
Eosinophils Relative: 4 %
HCT: 38.7 % (ref 36.0–46.0)
Hemoglobin: 12.3 g/dL (ref 12.0–15.0)
Immature Granulocytes: 0 %
Lymphocytes Relative: 40 %
Lymphs Abs: 3 10*3/uL (ref 0.7–4.0)
MCH: 26.3 pg (ref 26.0–34.0)
MCHC: 31.8 g/dL (ref 30.0–36.0)
MCV: 82.7 fL (ref 80.0–100.0)
MONOS PCT: 6 %
Monocytes Absolute: 0.4 10*3/uL (ref 0.1–1.0)
Neutro Abs: 3.7 10*3/uL (ref 1.7–7.7)
Neutrophils Relative %: 49 %
Platelets: 239 10*3/uL (ref 150–400)
RBC: 4.68 MIL/uL (ref 3.87–5.11)
RDW: 16.1 % — ABNORMAL HIGH (ref 11.5–15.5)
WBC: 7.4 10*3/uL (ref 4.0–10.5)
nRBC: 0 % (ref 0.0–0.2)

## 2018-05-19 LAB — COMPREHENSIVE METABOLIC PANEL
ALT: 12 U/L (ref 0–44)
AST: 14 U/L — AB (ref 15–41)
Albumin: 3.7 g/dL (ref 3.5–5.0)
Alkaline Phosphatase: 61 U/L (ref 38–126)
Anion gap: 8 (ref 5–15)
BUN: 15 mg/dL (ref 6–20)
CO2: 21 mmol/L — ABNORMAL LOW (ref 22–32)
Calcium: 8.9 mg/dL (ref 8.9–10.3)
Chloride: 110 mmol/L (ref 98–111)
Creatinine, Ser: 0.86 mg/dL (ref 0.44–1.00)
GFR calc Af Amer: >60 mL/min (ref >60)
GFR calc non Af Amer: >60 mL/min (ref >60)
Glucose, Bld: 111 mg/dL — ABNORMAL HIGH (ref 70–99)
Potassium: 3.3 mmol/L — ABNORMAL LOW (ref 3.5–5.1)
Sodium: 139 mmol/L (ref 135–145)
Total Bilirubin: 0.6 mg/dL (ref 0.3–1.2)
Total Protein: 7.2 g/dL (ref 6.5–8.1)

## 2018-05-19 LAB — I-STAT BETA HCG BLOOD, ED (MC, WL, AP ONLY): I-stat hCG, quantitative: <5 m[IU]/mL (ref <5)

## 2018-05-19 LAB — LIPASE, BLOOD: Lipase: 49 U/L (ref 11–51)

## 2018-05-19 MED ORDER — ONDANSETRON HCL 4 MG/2ML IJ SOLN
4.0000 mg | Freq: Once | INTRAMUSCULAR | Status: AC
  Administered 2018-05-19: 4 mg via INTRAVENOUS
  Filled 2018-05-19: qty 2

## 2018-05-19 MED ORDER — HYDROMORPHONE HCL 1 MG/ML IJ SOLN
0.5000 mg | INTRAMUSCULAR | Status: AC | PRN
  Administered 2018-05-19 – 2018-05-20 (×2): 0.5 mg via INTRAVENOUS
  Filled 2018-05-19 (×2): qty 1

## 2018-05-19 MED ORDER — SODIUM CHLORIDE 0.9 % IV BOLUS
1000.0000 mL | Freq: Once | INTRAVENOUS | Status: AC
  Administered 2018-05-19: 1000 mL via INTRAVENOUS

## 2018-05-19 MED ORDER — AMLODIPINE BESYLATE 5 MG PO TABS
5.0000 mg | ORAL_TABLET | Freq: Once | ORAL | Status: AC
  Administered 2018-05-19: 5 mg via ORAL
  Filled 2018-05-19: qty 1

## 2018-05-19 MED ORDER — SODIUM CHLORIDE 0.9 % IV SOLN
INTRAVENOUS | Status: DC
  Administered 2018-05-19: 20:00:00 via INTRAVENOUS

## 2018-05-19 MED ORDER — IOPAMIDOL (ISOVUE-300) INJECTION 61%
100.0000 mL | Freq: Once | INTRAVENOUS | Status: AC | PRN
  Administered 2018-05-19: 100 mL via INTRAVENOUS

## 2018-05-19 MED ORDER — SODIUM CHLORIDE (PF) 0.9 % IJ SOLN
INTRAMUSCULAR | Status: AC
  Administered 2018-05-20: 01:00:00
  Filled 2018-05-19: qty 50

## 2018-05-19 MED ORDER — IOPAMIDOL (ISOVUE-300) INJECTION 61%
INTRAVENOUS | Status: AC
  Filled 2018-05-19: qty 100

## 2018-05-19 NOTE — ED Notes (Signed)
Pt aware that a urine sample is needed. Pt went to restroom already but now has a urine specimen cup at bedside and agree's to give sample when she goes again.  RN notified.

## 2018-05-19 NOTE — ED Provider Notes (Signed)
[facility]-EMERGENCY DEPT Provider Note   CSN: [id number] Arrival date & time: 05/19/18  2440    History   Chief Complaint Chief Complaint  Patient presents with  . Abdominal Pain    HPI Tammie Miles is a 43 y.o. female.     HPI Pt presents to the ED for abdominal pain.  Hx limited by pt's aphasia but she is able to answer yes and no.  abd pain started today.  Upper abdomen. The pain radiates to the back.  She has nausea and vomiting with it today.  No diarrhea.  No dysuria.   Past Medical History:  Diagnosis Date  . ADHD   . Anxiety   . Depression   . Hypertension     Patient Active Problem List   Diagnosis Date Noted  . Cerebral edema (HCC) 01/16/2018  . At high risk for injury related to fall 01/16/2018  . Seizures (HCC) 01/16/2018  . Attention deficit hyperactivity disorder (ADHD) 01/16/2018  . Generalized anxiety disorder 01/16/2018  . Depression 01/16/2018  . Tracheostomy in place [facility])   . Pressure injury of skin 12/29/2017  . Altered mental status   . Central venous catheter in place   . 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 Hypertensive emergency 12/02/2017  . Anemia 12/02/2017  . Leukocytosis 12/02/2017  . Acute intra-cranial hemorrhage (HCC)   . Respiratory failure (HCC)   . ICH (intracerebral hemorrhage) (HCC) 11/27/2017  . Intracranial hemorrhage (HCC) 11/27/2017    Past Surgical History:  Procedure Laterality Date  . CRANIOTOMY Left 11/27/2017   Procedure: CRANIOTOMY for Evacuation of Intracranial Hemorrhage;  Surgeon: Lisbeth Renshaw, MD;  Location: [facility] OR;  Service: Neurosurgery;  Laterality: Left;  CRANIOTOMY for Evacuation of Intracranial Hemorrhage  . CRANIOTOMY Left 11/28/2017   Procedure: Decompressive craniectomy;  Surgeon: Maeola Harman, MD;  Location: [facility] OR;  Service: Neurosurgery;  Laterality: Left;  .  ESOPHAGOGASTRODUODENOSCOPY N/A 12/09/2017   Procedure: ESOPHAGOGASTRODUODENOSCOPY (EGD);  Surgeon: Violeta Gelinas, MD;  Location: [facility] ENDOSCOPY;  Service: General;  Laterality: N/A;  . PEG PLACEMENT N/A 12/09/2017   Procedure: PERCUTANEOUS ENDOSCOPIC GASTROSTOMY (PEG) PLACEMENT;  Surgeon: Violeta Gelinas, MD;  Location: [facility] ENDOSCOPY;  Service: General;  Laterality: N/A;     OB History   No obstetric history on file.      Home Medications    Prior to Admission medications   Medication Sig Start Date End Date Taking? Authorizing Provider  acetaminophen (TYLENOL) 325 MG tablet Take 2 tablets (650 mg total) by mouth every 4 (four) hours as needed for mild pain (or temp > 37.5 C (99.5 F)). 01/15/18  Yes Angiulli, Mcarthur Rossetti, PA-C  amLODipine (NORVASC) 10 MG tablet Take 1 tablet (10 mg total) by mouth daily. 01/08/18  Yes Layne Benton, NP  aspirin 81 MG chewable tablet Place 1 tablet (81 mg total) into feeding tube daily. 01/08/18  Yes Layne Benton, NP  Cobalamin Combinations (VITAMIN B12-FOLIC ACID PO) Take 1 tablet by mouth daily.   Yes [provider]  diclofenac sodium (VOLTAREN) 1 % GEL Apply 2 g topically daily as needed (pain).   Yes [provider]  famotidine (PEPCID) 20 MG tablet Take 20 mg by mouth 2 (two) times daily.   Yes [provider]  feeding supplement (BOOST HIGH PROTEIN) LIQD Take 1 Container by mouth 2 (two) times daily between meals.   Yes [provider]  labetalol (NORMODYNE) 300 MG tablet Take 1 tablet (300 mg total) by mouth 3 (three) times daily. 01/08/18  Yes Layne BentonBiby, Sharon L, NP  levETIRAcetam (KEPPRA) 1000 MG tablet Take 1,000 mg by mouth every 12 (twelve) hours.   Yes [provider]  Multiple Vitamins-Minerals (CERTA PLUS PO) Take 1 tablet by mouth daily.   Yes [provider]  psyllium (HYDROCIL/METAMUCIL) 95 % PACK Take 1 packet by mouth 2 (two) times daily. 01/08/18  Yes Layne BentonBiby, Sharon L, NP  docusate (COLACE)  50 MG/5ML liquid Place 10 mLs (100 mg total) into feeding tube 2 (two) times daily. Patient not taking: Reported on 05/19/2018 01/08/18   Layne BentonBiby, Sharon L, NP  famotidine (PEPCID) 40 MG/5ML suspension Place 2.5 mLs (20 mg total) into feeding tube 2 (two) times daily. Patient not taking: Reported on 05/19/2018 01/08/18   Layne BentonBiby, Sharon L, NP  ferrous sulfate 325 (65 FE) MG tablet Take 1 tablet (325 mg total) by mouth 2 (two) times daily. Patient not taking: Reported on 05/19/2018 01/08/18   Layne BentonBiby, Sharon L, NP  folic acid (FOLVITE) 1 MG tablet Place 1 tablet (1 mg total) into feeding tube daily. Patient not taking: Reported on 05/19/2018 01/08/18   Layne BentonBiby, Sharon L, NP  levETIRAcetam (KEPPRA) 100 MG/ML solution Place 10 mLs (1,000 mg total) into feeding tube every 12 (twelve) hours. Patient not taking: Reported on 05/19/2018 01/08/18   Layne BentonBiby, Sharon L, NP  Multiple Vitamin (MULTIVITAMIN) LIQD Place 15 mLs into feeding tube daily. Patient not taking: Reported on 05/19/2018 01/08/18   Layne BentonBiby, Sharon L, NP  senna-docusate (SENOKOT-S) 8.6-50 MG tablet Take 1 tablet by mouth at bedtime as needed for mild constipation. Patient not taking: Reported on 05/19/2018 01/08/18   Layne BentonBiby, Sharon L, NP  Water For Irrigation, Sterile (FREE WATER) SOLN Place 150 mLs into feeding tube every 4 (four) hours. 01/15/18   Angiulli, Mcarthur Rossettianiel J, PA-C  Water For Irrigation, Sterile (FREE WATER) SOLN Place 150 mLs into feeding tube 2 times daily at 12 noon and 4 pm. 01/16/18   Angiulli, Mcarthur Rossettianiel J, PA-C    Family History Family History  Problem Relation Age of Onset  . Hypertension Mother   . Hypertension Father     Social History Social History   Tobacco Use  . Smoking status: Unknown If Ever Smoked  Substance Use Topics  . Alcohol use: Yes  . Drug use: Not on file     Allergies   Patient has no known allergies.   Review of Systems Review of Systems  All other systems reviewed and are negative.    Physical Exam Updated  Vital Signs BP (!) 173/111   Pulse 65   Temp 98.4 F (36.9 C) (Oral)   Resp 18   SpO2 97%   Physical Exam Vitals signs and nursing note reviewed.  Constitutional:      General: She is not in acute distress.    Appearance: She is well-developed.  HENT:     Head: Normocephalic and atraumatic.     Right Ear: External ear normal.     Left Ear: External ear normal.  Eyes:     General: No scleral icterus.       Right eye: No discharge.        Left eye: No discharge.     Conjunctiva/sclera: Conjunctivae normal.  Neck:     Musculoskeletal: Neck supple.     Trachea: No tracheal deviation.  Cardiovascular:     Rate and Rhythm: Normal rate and  regular rhythm.  Pulmonary:     Effort: Pulmonary effort is normal. No respiratory distress.     Breath sounds: Normal breath sounds. No stridor. No wheezing or rales.  Abdominal:     General: Bowel sounds are normal. There is no distension.     Palpations: Abdomen is soft.     Tenderness: There is abdominal tenderness in the epigastric area. There is no guarding or rebound.     Hernia: No hernia is present.  Musculoskeletal:        General: No tenderness.  Skin:    General: Skin is warm and dry.     Findings: No rash.  Neurological:     Mental Status: She is alert.     Cranial Nerves: No cranial nerve deficit (no facial droop, extraocular movements intact, no slurred speech).     Sensory: No sensory deficit.     Motor: No abnormal muscle tone or seizure activity.     Coordination: Coordination normal.      ED Treatments / Results  Labs (all labs ordered are listed, but only abnormal results are displayed) Labs Reviewed  COMPREHENSIVE METABOLIC PANEL - Abnormal; Notable for the following components:      Result Value   Potassium 3.3 (*)    CO2 21 (*)    Glucose, Bld 111 (*)    AST 14 (*)    All other components within normal limits  CBC WITH DIFFERENTIAL/PLATELET - Abnormal; Notable for the following components:   RDW 16.1 (*)     All other components within normal limits  LIPASE, BLOOD  URINALYSIS, ROUTINE W REFLEX MICROSCOPIC  I-STAT BETA HCG BLOOD, ED ([facility], [facility], [facility] ONLY)    EKG None  Radiology Ct Abdomen Pelvis W Contrast  Result Date: 05/20/2018 CLINICAL DATA:  Left lower quadrant pain EXAM: CT ABDOMEN AND PELVIS WITH CONTRAST TECHNIQUE: Multidetector CT imaging of the abdomen and pelvis was performed using the standard protocol following bolus administration of intravenous contrast. CONTRAST:  ISOVUE-300 IOPAMIDOL (ISOVUE-300) INJECTION 61% COMPARISON:  Ultrasound 09/12/2009 FINDINGS: Lower chest: No acute consolidation or pleural effusion. Mild cardiomegaly. Hepatobiliary: No focal liver abnormality is seen. No gallstones, gallbladder wall thickening, or biliary dilatation. Pancreas: Unremarkable. No pancreatic ductal dilatation or surrounding inflammatory changes. Spleen: Normal in size without focal abnormality. Adrenals/Urinary Tract: Adrenal glands are unremarkable. Kidneys are normal, without renal calculi, focal lesion, or hydronephrosis. Bladder is unremarkable. Stomach/Bowel: Stomach is within normal limits. Appendix appears normal. No evidence of bowel wall thickening, distention, or inflammatory changes. Vascular/Lymphatic: Mild aortic atherosclerosis without aneurysm. No significantly enlarged lymph nodes. Reproductive: Uterus is unremarkable. Ring-enhancing probable involuting left adnexal cyst. Other: No free air. Trace free fluid in the pelvis. 2 adjacent linear densities in the right abdominal subcutaneous fat measuring approximately 8.4 cm and 5 cm. No surrounding inflammatory change. Musculoskeletal: No acute or suspicious osseous abnormality. IMPRESSION: 1. No CT evidence for acute intra-abdominal or pelvic abnormality. 2. Trace free fluid in the pelvis. Probable involuting left adnexal cyst. 3. Large curvilinear densities within the subcutaneous fat of the right abdominal wall of uncertain  etiology, has an appearance suggesting foreign body/foreign material as opposed to dystrophic calcification. No acute inflammatory changes surrounding this finding. Electronically Signed   By: Jasmine Pang M.D.   On: 05/20/2018 00:19    Procedures Procedures (including critical care time)  Medications Ordered in ED Medications  sodium chloride 0.9 % bolus 1,000 mL (0 mLs Intravenous Stopped 05/19/18 2204)    And  0.9 %  sodium chloride infusion ( Intravenous New Bag/Given 05/19/18 1957)  HYDROmorphone (DILAUDID) injection 0.5 mg (0.5 mg Intravenous Given 05/19/18 2000)  iopamidol (ISOVUE-300) 61 % injection (has no administration in time range)  sodium chloride (PF) 0.9 % injection (has no administration in time range)  labetalol (NORMODYNE,TRANDATE) injection 10 mg (has no administration in time range)  ondansetron (ZOFRAN) injection 4 mg (4 mg Intravenous Given 05/19/18 1959)  amLODipine (NORVASC) tablet 5 mg (5 mg Oral Given 05/19/18 2312)  iopamidol (ISOVUE-300) 61 % injection 100 mL (100 mLs Intravenous Contrast Given 05/19/18 2347)     Initial Impression / Assessment and Plan / ED Course  I have reviewed the triage vital signs and the nursing notes.  Pertinent labs & imaging results that were available during my care of the patient were reviewed by me and considered in my medical decision making (see chart for details).  Clinical Course as of May 20 31  Tue May 19, 2018  2303 Labs reviewed.  CBC metabolic panel are normal.  Urinalysis still pending.  Patient still complains of diffuse pain.  History and physical is somewhat limited with her prior stroke.  Will CT to evaluate further.   [JK]    Clinical Course User Index [JK] Linwood Dibbles, MD    Patient presented to the emergency room for evaluation of abdominal pain.  ED work-up is reassuring.  Laboratory tests are unremarkable.  CT scan does not show any evidence of acute abnormality (subq finding does not appear to be related to  her sx today, no findings noted on the skin).  Patient was noted to be hypertensive.  She was given a dose of her blood pressure medications.  Urinalysis is pending.  Anticipate discharge  Final Clinical Impressions(s) / ED Diagnoses   Final diagnoses:  Abdominal pain, unspecified abdominal location  Hypertension, unspecified type    ED Discharge Orders    None       Linwood Dibbles, MD 05/20/18 [phone]

## 2018-05-19 NOTE — ED Triage Notes (Signed)
Per EMS pt form [facility] with a c/o of LLQ abdominal pain that started after breakfast today. Denies n/v/d. Pt has hx of stroke and right sided deficit, is nonverbal at baseline, A+O x 3

## 2018-05-20 ENCOUNTER — Emergency Department (HOSPITAL_COMMUNITY): Payer: Medicaid Other

## 2018-05-20 ENCOUNTER — Emergency Department (HOSPITAL_COMMUNITY)
Admission: EM | Admit: 2018-05-20 | Discharge: 2018-05-20 | Disposition: A | Discharge status: Skilled Nursing Facility | Payer: Medicaid Other | Source: Home / Self Care | Attending: Emergency Medicine | Admitting: Emergency Medicine

## 2018-05-20 ENCOUNTER — Encounter (HOSPITAL_COMMUNITY): Payer: Self-pay

## 2018-05-20 DIAGNOSIS — R569 Unspecified convulsions: Secondary | ICD-10-CM

## 2018-05-20 HISTORY — DX: Unspecified convulsions: R56.9

## 2018-05-20 LAB — CBG MONITORING, ED: Glucose-Capillary: 93 mg/dL (ref 70–99)

## 2018-05-20 LAB — URINALYSIS, ROUTINE W REFLEX MICROSCOPIC
Bilirubin Urine: NEGATIVE
Bilirubin Urine: NEGATIVE
Glucose, UA: NEGATIVE mg/dL
Glucose, UA: NEGATIVE mg/dL
HGB URINE DIPSTICK: NEGATIVE
Hgb urine dipstick: NEGATIVE
Ketones, ur: NEGATIVE mg/dL
Ketones, ur: NEGATIVE mg/dL
Leukocytes,Ua: NEGATIVE
Nitrite: NEGATIVE
Nitrite: NEGATIVE
Protein, ur: NEGATIVE mg/dL
Protein, ur: NEGATIVE mg/dL
Specific Gravity, Urine: >1.046 — ABNORMAL HIGH (ref 1.005–1.030)
Specific Gravity, Urine: 1.02 (ref 1.005–1.030)
pH: 6 (ref 5.0–8.0)
pH: 6 (ref 5.0–8.0)

## 2018-05-20 LAB — CBC WITH DIFFERENTIAL/PLATELET
Abs Immature Granulocytes: 0.02 10*3/uL (ref 0.00–0.07)
Basophils Absolute: 0 10*3/uL (ref 0.0–0.1)
Basophils Relative: 1 %
EOS ABS: 0.3 10*3/uL (ref 0.0–0.5)
Eosinophils Relative: 4 %
HCT: 42.2 % (ref 36.0–46.0)
Hemoglobin: 13 g/dL (ref 12.0–15.0)
Immature Granulocytes: 0 %
Lymphocytes Relative: 43 %
Lymphs Abs: 3.7 10*3/uL (ref 0.7–4.0)
MCH: 25.1 pg — ABNORMAL LOW (ref 26.0–34.0)
MCHC: 30.8 g/dL (ref 30.0–36.0)
MCV: 81.6 fL (ref 80.0–100.0)
MONOS PCT: 5 %
Monocytes Absolute: 0.5 10*3/uL (ref 0.1–1.0)
Neutro Abs: 4 10*3/uL (ref 1.7–7.7)
Neutrophils Relative %: 47 %
Platelets: 196 10*3/uL (ref 150–400)
RBC: 5.17 MIL/uL — ABNORMAL HIGH (ref 3.87–5.11)
RDW: 16.1 % — ABNORMAL HIGH (ref 11.5–15.5)
WBC: 8.6 10*3/uL (ref 4.0–10.5)
nRBC: 0 % (ref 0.0–0.2)

## 2018-05-20 LAB — BASIC METABOLIC PANEL
Anion gap: 10 (ref 5–15)
BUN: 8 mg/dL (ref 6–20)
CO2: 17 mmol/L — ABNORMAL LOW (ref 22–32)
Calcium: 9.1 mg/dL (ref 8.9–10.3)
Chloride: 110 mmol/L (ref 98–111)
Creatinine, Ser: 0.86 mg/dL (ref 0.44–1.00)
GFR calc Af Amer: >60 mL/min (ref >60)
GFR calc non Af Amer: >60 mL/min (ref >60)
Glucose, Bld: 98 mg/dL (ref 70–99)
Potassium: 3.5 mmol/L (ref 3.5–5.1)
Sodium: 137 mmol/L (ref 135–145)

## 2018-05-20 LAB — I-STAT BETA HCG BLOOD, ED (MC, WL, AP ONLY): I-stat hCG, quantitative: 6.4 m[IU]/mL — ABNORMAL HIGH (ref <5)

## 2018-05-20 MED ORDER — MORPHINE SULFATE (PF) 4 MG/ML IV SOLN
4.0000 mg | Freq: Once | INTRAVENOUS | Status: AC
  Administered 2018-05-20: 4 mg via INTRAVENOUS
  Filled 2018-05-20: qty 1

## 2018-05-20 MED ORDER — LORAZEPAM 2 MG/ML IJ SOLN
2.0000 mg | Freq: Once | INTRAMUSCULAR | Status: DC
  Filled 2018-05-20: qty 1

## 2018-05-20 MED ORDER — ONDANSETRON HCL 4 MG/2ML IJ SOLN
4.0000 mg | Freq: Once | INTRAMUSCULAR | Status: AC
Start: 2018-05-20 — End: 2018-05-20
  Administered 2018-05-20: 4 mg via INTRAVENOUS
  Filled 2018-05-20: qty 2

## 2018-05-20 MED ORDER — IBUPROFEN 400 MG PO TABS: 400.0000 mg | ORAL_TABLET | Freq: Four times a day (QID) | ORAL | 0 refills | Status: DC | PRN

## 2018-05-20 MED ORDER — AMLODIPINE BESYLATE 5 MG PO TABS
5.0000 mg | ORAL_TABLET | Freq: Once | ORAL | Status: AC
Start: 2018-05-20 — End: 2018-05-20
  Administered 2018-05-20: 5 mg via ORAL
  Filled 2018-05-20: qty 1

## 2018-05-20 MED ORDER — IOHEXOL 300 MG/ML  SOLN
100.0000 mL | Freq: Once | INTRAMUSCULAR | Status: AC | PRN
  Administered 2018-05-20: 100 mL via INTRAVENOUS

## 2018-05-20 MED ORDER — LABETALOL HCL 200 MG PO TABS
300.0000 mg | ORAL_TABLET | Freq: Once | ORAL | Status: AC
  Administered 2018-05-20: 300 mg via ORAL
  Filled 2018-05-20: qty 2

## 2018-05-20 MED ORDER — AMLODIPINE BESYLATE 5 MG PO TABS
10.0000 mg | ORAL_TABLET | Freq: Once | ORAL | Status: DC
  Filled 2018-05-20: qty 2

## 2018-05-20 MED ORDER — ACETAMINOPHEN 325 MG PO TABS: 650.0000 mg | ORAL_TABLET | Freq: Four times a day (QID) | ORAL | 0 refills | Status: DC | PRN

## 2018-05-20 MED ORDER — SODIUM CHLORIDE 0.9 % IV BOLUS
500.0000 mL | Freq: Once | INTRAVENOUS | Status: AC
  Administered 2018-05-20: 500 mL via INTRAVENOUS

## 2018-05-20 MED ORDER — LEVETIRACETAM IN NACL 1000 MG/100ML IV SOLN
1000.0000 mg | Freq: Once | INTRAVENOUS | Status: AC
  Administered 2018-05-20: 1000 mg via INTRAVENOUS
  Filled 2018-05-20: qty 100

## 2018-05-20 MED ORDER — DICYCLOMINE HCL 20 MG PO TABS: 20.0000 mg | ORAL_TABLET | Freq: Three times a day (TID) | ORAL | 0 refills | Status: DC | PRN

## 2018-05-20 MED ORDER — HYDRALAZINE HCL 20 MG/ML IJ SOLN
10.0000 mg | Freq: Once | INTRAMUSCULAR | Status: AC
  Administered 2018-05-20: 10 mg via INTRAVENOUS
  Filled 2018-05-20: qty 1

## 2018-05-20 MED ORDER — ONDANSETRON 4 MG PO TBDP: 4.0000 mg | ORAL_TABLET | Freq: Four times a day (QID) | ORAL | 0 refills | Status: DC | PRN

## 2018-05-20 MED ORDER — LABETALOL HCL 300 MG PO TABS
300.0000 mg | ORAL_TABLET | Freq: Once | ORAL | Status: AC
  Administered 2018-05-20: 300 mg via ORAL
  Filled 2018-05-20: qty 1

## 2018-05-20 MED ORDER — KETOROLAC TROMETHAMINE 15 MG/ML IJ SOLN
15.0000 mg | Freq: Once | INTRAMUSCULAR | Status: AC
  Administered 2018-05-20: 15 mg via INTRAVENOUS
  Filled 2018-05-20: qty 1

## 2018-05-20 MED ORDER — LABETALOL HCL 5 MG/ML IV SOLN
10.0000 mg | Freq: Once | INTRAVENOUS | Status: AC
  Administered 2018-05-20: 10 mg via INTRAVENOUS
  Filled 2018-05-20: qty 4

## 2018-05-20 NOTE — ED Notes (Signed)
CALLED [facility] AT 4:22PM, SAYS THERE IS A FEW IN FRONT

## 2018-05-20 NOTE — ED Provider Notes (Signed)
1:00 AM  Assumed care from Dr. Lynelle Doctor.  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  Labs unremarkable.  CT of the abdomen pelvis shows no acute abnormality.  Urine was pending and shows no sign of infection.  She has been hypertensive here.  Received IV labetalol.  Will reassess blood pressure but anticipate discharge home.  2:25 AM  Pt's blood pressure continues to be elevated despite IV hydralazine and labetalol.  Also given dose of amlodipine here.  She reports compliance with her blood pressure medications at home.  Still having diffuse abdominal pain and nausea.  States she has had vomiting and diarrhea at home.  Suspect viral illness given work-up here has been unremarkable.  Will treat pain and nausea to see if this improves her blood pressure.  4:00 AM  Pt's blood pressure is improved.  Abdominal pain is improved.  She has been able to tolerate p.o. without difficulty.  Briefly hypoxic after morphine but this has resolved.  I feel she is safe to be discharged home.  She is comfortable with this plan.   At this time, I do not feel there is any life-threatening condition present. I have reviewed and discussed all results (EKG, imaging, lab, urine as appropriate) and exam findings with patient/family. I have reviewed nursing notes and appropriate previous records.  I feel the patient is safe to be discharged home without further emergent workup and can continue workup as an outpatient as needed. Discussed usual and customary return precautions. Patient/family verbalize understanding and are comfortable with this plan.  Outpatient follow-up has been provided as needed. All questions have been answered.    Tammie Miles, Tammie Maw, DO 05/20/18 [phone]

## 2018-05-20 NOTE — ED Notes (Signed)
In and out not needed.  Pt was able to urinate into a hat in restroom  EDP notified.

## 2018-05-20 NOTE — Discharge Instructions (Addendum)
Your labs, CT of your abdomen and pelvis, urine were normal today.  Your blood pressure was elevated but this improved with blood pressure medications in the ED.  Please continue your amlodipine and labetalol as prescribed and follow-up closely with your primary care physician.

## 2018-05-20 NOTE — ED Notes (Signed)
Pt tolerating fluids with no difficulty  

## 2018-05-20 NOTE — ED Notes (Signed)
Patient verbalizes understanding of discharge instructions. Opportunity for questioning and answers were provided. Armband removed by staff, pt discharged from ED. Paperwork given to [employer]. [facility] aware pt is returning.

## 2018-05-20 NOTE — ED Triage Notes (Signed)
Pt arrives via [facility] from [place] with witnessed 30 second seizure where she fell and hit head. Pt on daily ASA. Hx of seizures, but missed keppra and HTN meds yesterday. Pt had 30 second LOC after fall. Pt c/o HA and nauea. Pt received 4 of zofran via EMS. Pt has baseline expressive aphasia and right sided weakness from previous stroke.

## 2018-05-20 NOTE — ED Provider Notes (Addendum)
[facility] EMERGENCY DEPARTMENT Provider Note   CSN: [id number] Arrival date & time: 05/20/18  1323    History   Chief Complaint Chief Complaint  Patient presents with   Seizures   Fall    HPI Tammie Miles is a 43 y.o. female.     HPI  Level 5 caveat, history is limited because of patient's aphasia.  43 year old female with history of seizures, MCA stroke with right-sided deficits comes in with chief complaint of seizure-like activity.  Patient resides at [facility] rehab facility.  According to the EMS note patient had a seizure-like activity that lasted about 30 seconds.  Patient had a fall.  She is complaining of headache, nausea.  During my evaluation patient is grimacing when we are examining her abdomen.  Additionally it appears that she has some discomfort with urination.  Patient's aunt who has medical POA is at the bedside.  She reports that patient has not had a seizure since November.  She is unaware of any infections or fever recently.  Upon calling [facility] no one is picking of the phone call when transferred by the clerk.  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/02/2017   Stroke (cerebrum) (HCC) 12/02/2017   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   Hypertensive emergency 12/02/2017   Anemia 12/02/2017   Leukocytosis 12/02/2017   Acute  intra-cranial hemorrhage (HCC)    Respiratory failure (HCC)    ICH (intracerebral hemorrhage) (HCC) 11/27/2017   Intracranial hemorrhage (HCC) 11/27/2017    Past Surgical History:  Procedure Laterality Date   CRANIOTOMY Left 11/27/2017   Procedure: CRANIOTOMY for Evacuation of Intracranial Hemorrhage;  Surgeon: Lisbeth Renshaw, MD;  Location: [facility] OR;  Service: Neurosurgery;  Laterality: Left;  CRANIOTOMY for Evacuation of Intracranial Hemorrhage   CRANIOTOMY Left 11/28/2017   Procedure: Decompressive craniectomy;  Surgeon: Maeola Harman, MD;  Location[facility] OR;  Service: Neurosurgery;  Laterality: Left;   ESOPHAGOGASTRODUODENOSCOPY N/A 12/09/2017   Procedure: ESOPHAGOGASTRODUODENOSCOPY (EGD);  Surgeon: Violeta Gelinas, MD;  Location: [facility] ENDOSCOPY;  Service: General;  Laterality: N/A;   PEG PLACEMENT N/A 12/09/2017   Procedure: PERCUTANEOUS ENDOSCOPIC GASTROSTOMY (PEG) PLACEMENT;  Surgeon: Violeta Gelinas, MD;  Location: [facility] ENDOSCOPY;  Service: General;  Laterality: N/A;     OB History   No obstetric history on file.      Home Medications    Prior to Admission medications   Medication Sig Start Date End Date Taking? Authorizing Provider  acetaminophen (TYLENOL) 325 MG tablet Take 2 tablets (650 mg total) by mouth every 4 (four) hours as needed for mild pain (or temp > 37.5 C (99.5 F)). 01/15/18   Angiulli, Mcarthur Rossetti, PA-C  amLODipine (NORVASC) 10 MG tablet Take 1 tablet (10 mg total) by mouth daily. 01/08/18   Layne Benton, NP  aspirin 81 MG chewable tablet Place  1 tablet (81 mg total) into feeding tube daily. 01/08/18   Layne Benton, NP  Cobalamin Combinations (VITAMIN B12-FOLIC ACID PO) Take 1 tablet by mouth daily.    [provider]  diclofenac sodium (VOLTAREN) 1 % GEL Apply 2 g topically daily as needed (pain).    [provider]  dicyclomine (BENTYL) 20 MG tablet Take 1 tablet (20 mg total) by mouth every 8 (eight) hours as needed for spasms (Abdominal  cramping). 05/20/18   Ward, [name], DO  docusate (COLACE) 50 MG/5ML liquid Place 10 mLs (100 mg total) into feeding tube 2 (two) times daily. Patient not taking: Reported on 05/19/2018 01/08/18   Layne Benton, NP  famotidine (PEPCID) 20 MG tablet Take 20 mg by mouth 2 (two) times daily.    [provider]  famotidine (PEPCID) 40 MG/5ML suspension Place 2.5 mLs (20 mg total) into feeding tube 2 (two) times daily. Patient not taking: Reported on 05/19/2018 01/08/18   Layne Benton, NP  feeding supplement (BOOST HIGH PROTEIN) LIQD Take 1 Container by mouth 2 (two) times daily between meals.    [provider]  ferrous sulfate 325 (65 FE) MG tablet Take 1 tablet (325 mg total) by mouth 2 (two) times daily. Patient not taking: Reported on 05/19/2018 01/08/18   Layne Benton, NP  folic acid (FOLVITE) 1 MG tablet Place 1 tablet (1 mg total) into feeding tube daily. Patient not taking: Reported on 05/19/2018 01/08/18   Layne Benton, NP  labetalol (NORMODYNE) 300 MG tablet Take 1 tablet (300 mg total) by mouth 3 (three) times daily. 01/08/18   Layne Benton, NP  levETIRAcetam (KEPPRA) 100 MG/ML solution Place 10 mLs (1,000 mg total) into feeding tube every 12 (twelve) hours. Patient not taking: Reported on 05/19/2018 01/08/18   Layne Benton, NP  levETIRAcetam (KEPPRA) 1000 MG tablet Take 1,000 mg by mouth every 12 (twelve) hours.    [provider]  Multiple Vitamin (MULTIVITAMIN) LIQD Place 15 mLs into feeding tube daily. Patient not taking: Reported on 05/19/2018 01/08/18   Layne Benton, NP  Multiple Vitamins-Minerals (CERTA PLUS PO) Take 1 tablet by mouth daily.    [provider]  ondansetron (ZOFRAN ODT) 4 MG disintegrating tablet Take 1 tablet (4 mg total) by mouth every 6 (six) hours as needed for nausea or vomiting. 05/20/18   Ward, Baxter Hire N, DO  psyllium (HYDROCIL/METAMUCIL) 95 % PACK Take 1 packet by mouth 2 (two) times daily. 01/08/18   Layne Benton,  NP  senna-docusate (SENOKOT-S) 8.6-50 MG tablet Take 1 tablet by mouth at bedtime as needed for mild constipation. Patient not taking: Reported on 05/19/2018 01/08/18   Layne Benton, NP  Water For Irrigation, Sterile (FREE WATER) SOLN Place 150 mLs into feeding tube every 4 (four) hours. 01/15/18   Angiulli, Mcarthur Rossetti, PA-C  Water For Irrigation, Sterile (FREE WATER) SOLN Place 150 mLs into feeding tube 2 times daily at 12 noon and 4 pm. 01/16/18   Angiulli, Mcarthur Rossetti, PA-C    Family History Family History  Problem Relation Age of Onset   Hypertension Mother    Hypertension Father     Social History Social History   Tobacco Use   Smoking status: Unknown If Ever Smoked  Substance Use Topics   Alcohol use: Yes   Drug use: Not on file     Allergies   Patient has no known allergies.   Review of Systems Review of  Systems  Unable to perform ROS: Dementia  Constitutional: Positive for activity change.  Neurological: Positive for seizures.     Physical Exam Updated Vital Signs BP (!) 164/110    Pulse 89    Temp 98.5 F (36.9 C) (Oral)    Resp (!) 22    SpO2 96%   Physical Exam Vitals signs and nursing note reviewed.  Constitutional:      Appearance: She is well-developed.  HENT:     Head: Normocephalic and atraumatic.  Neck:     Musculoskeletal: Normal range of motion and neck supple.  Cardiovascular:     Rate and Rhythm: Normal rate.  Pulmonary:     Effort: Pulmonary effort is normal.  Abdominal:     General: Bowel sounds are normal.     Tenderness: There is abdominal tenderness.     Comments: Generalized abdominal tenderness worse over the right side.  Skin:    General: Skin is warm and dry.  Neurological:     Mental Status: She is alert. Mental status is at baseline.     Cranial Nerves: No cranial nerve deficit.      ED Treatments / Results  Labs (all labs ordered are listed, but only abnormal results are displayed) Labs Reviewed  BASIC METABOLIC  PANEL - Abnormal; Notable for the following components:      Result Value   CO2 17 (*)    All other components within normal limits  CBC WITH DIFFERENTIAL/PLATELET - Abnormal; Notable for the following components:   RBC 5.17 (*)    MCH 25.1 (*)    RDW 16.1 (*)    All other components within normal limits  URINALYSIS, ROUTINE W REFLEX MICROSCOPIC - Abnormal; Notable for the following components:   APPearance HAZY (*)    Leukocytes,Ua MODERATE (*)    Bacteria, UA RARE (*)    All other components within normal limits  I-STAT BETA HCG BLOOD, ED (MC, WL, AP ONLY) - Abnormal; Notable for the following components:   I-stat hCG, quantitative 6.4 (*)    All other components within normal limits  URINE CULTURE  CBG MONITORING, ED    EKG EKG Interpretation  Date/Time:  Wednesday May 20 2018 13:37:47 EDT Ventricular Rate:  92 PR Interval:    QRS Duration: 103 QT Interval:  408 QTC Calculation: 505 R Axis:   -15 Text Interpretation:  Sinus rhythm Borderline left axis deviation Probable anterior infarct, age indeterminate No acute changes No significant change since last tracing Confirmed by Derwood Kaplan [phone]) on 05/20/2018 2:29:43 PM   Radiology Ct Head Wo Contrast  Result Date: 05/20/2018 CLINICAL DATA:  Seizure, fall EXAM: CT HEAD WITHOUT CONTRAST CT CERVICAL SPINE WITHOUT CONTRAST TECHNIQUE: Multidetector CT imaging of the head and cervical spine was performed following the standard protocol without intravenous contrast. Multiplanar CT image reconstructions of the cervical spine were also generated. COMPARISON:  CT brain, 12/02/2017 FINDINGS: CT HEAD FINDINGS Brain: There is redemonstrated, developing encephalomalacia of the left MCA territory, with decreased parenchymal attenuation and edema compared to prior examination dated 12/02/2017. Previously seen midline shift has resolved. Vascular: No hyperdense vessel or unexpected calcification. Skull: Status post left frontoparietal  craniectomy. Negative for fracture or focal lesion. Sinuses/Orbits: No acute finding. Other: None. CT CERVICAL SPINE FINDINGS Alignment: Normal. Skull base and vertebrae: No acute fracture. No primary bone lesion or focal pathologic process. Soft tissues and spinal canal: No prevertebral fluid or swelling. No visible canal hematoma. Disc levels: Moderate disc degenerative disease and osteophytosis  of C5 through C7 Upper chest: Negative. Other: None. IMPRESSION: 1.  No acutely superimposed intracranial pathology. 2. There is redemonstrated, developing encephalomalacia of the left MCA territory, with decreased parenchymal attenuation and edema compared to prior examination dated 12/02/2017, consistent with expected evolution. Previously seen midline shift has resolved reflecting decrease in edema. Overlying craniectomy. 3.  No fracture or static subluxation of the cervical spine. Electronically Signed   By: Lauralyn Primes M.D.   On: 05/20/2018 15:32   Ct Cervical Spine Wo Contrast  Result Date: 05/20/2018 CLINICAL DATA:  Seizure, fall EXAM: CT HEAD WITHOUT CONTRAST CT CERVICAL SPINE WITHOUT CONTRAST TECHNIQUE: Multidetector CT imaging of the head and cervical spine was performed following the standard protocol without intravenous contrast. Multiplanar CT image reconstructions of the cervical spine were also generated. COMPARISON:  CT brain, 12/02/2017 FINDINGS: CT HEAD FINDINGS Brain: There is redemonstrated, developing encephalomalacia of the left MCA territory, with decreased parenchymal attenuation and edema compared to prior examination dated 12/02/2017. Previously seen midline shift has resolved. Vascular: No hyperdense vessel or unexpected calcification. Skull: Status post left frontoparietal craniectomy. Negative for fracture or focal lesion. Sinuses/Orbits: No acute finding. Other: None. CT CERVICAL SPINE FINDINGS Alignment: Normal. Skull base and vertebrae: No acute fracture. No primary bone lesion or focal  pathologic process. Soft tissues and spinal canal: No prevertebral fluid or swelling. No visible canal hematoma. Disc levels: Moderate disc degenerative disease and osteophytosis of C5 through C7 Upper chest: Negative. Other: None. IMPRESSION: 1.  No acutely superimposed intracranial pathology. 2. There is redemonstrated, developing encephalomalacia of the left MCA territory, with decreased parenchymal attenuation and edema compared to prior examination dated 12/02/2017, consistent with expected evolution. Previously seen midline shift has resolved reflecting decrease in edema. Overlying craniectomy. 3.  No fracture or static subluxation of the cervical spine. Electronically Signed   By: Lauralyn Primes M.D.   On: 05/20/2018 15:32   Ct Abdomen Pelvis W Contrast  Result Date: 05/20/2018 CLINICAL DATA:  Blunt abdominal trauma. EXAM: CT ABDOMEN AND PELVIS WITH CONTRAST TECHNIQUE: Multidetector CT imaging of the abdomen and pelvis was performed using the standard protocol following bolus administration of intravenous contrast. CONTRAST:  OMNIPAQUE IOHEXOL 300 MG/ML  SOLN COMPARISON:  05/19/2018 FINDINGS: Lower chest: No acute abnormality. Hepatobiliary: No hepatic injury or perihepatic hematoma. Gallbladder is unremarkable Pancreas: Unremarkable. No pancreatic ductal dilatation or surrounding inflammatory changes. Spleen: Normal in size without focal abnormality. Adrenals/Urinary Tract: No adrenal hemorrhage or renal injury identified. Bladder is unremarkable. Stomach/Bowel: Stomach is within normal limits. Appendix appears normal. No evidence of bowel wall thickening, distention, or inflammatory changes. Vascular/Lymphatic: Aortic atherosclerosis. No aneurysm. No abdominal no pelvic adenopathy. Reproductive: Uterus and bilateral adnexa are unremarkable. Other: No abdominal wall hernia or abnormality. No abdominopelvic ascites. Craniectomy transplant is identified within the subcutaneous fat of the right lateral  ventral abdominal wall. Musculoskeletal: No acute or significant osseous findings. IMPRESSION: 1. No acute findings within the abdomen or pelvis. Electronically Signed   By: Signa Kell M.D.   On: 05/20/2018 15:44   Ct Abdomen Pelvis W Contrast  Result Date: 05/20/2018 CLINICAL DATA:  Left lower quadrant pain EXAM: CT ABDOMEN AND PELVIS WITH CONTRAST TECHNIQUE: Multidetector CT imaging of the abdomen and pelvis was performed using the standard protocol following bolus administration of intravenous contrast. CONTRAST:  ISOVUE-300 IOPAMIDOL (ISOVUE-300) INJECTION 61% COMPARISON:  Ultrasound 09/12/2009 FINDINGS: Lower chest: No acute consolidation or pleural effusion. Mild cardiomegaly. Hepatobiliary: No focal liver abnormality is seen. No gallstones, gallbladder wall  thickening, or biliary dilatation. Pancreas: Unremarkable. No pancreatic ductal dilatation or surrounding inflammatory changes. Spleen: Normal in size without focal abnormality. Adrenals/Urinary Tract: Adrenal glands are unremarkable. Kidneys are normal, without renal calculi, focal lesion, or hydronephrosis. Bladder is unremarkable. Stomach/Bowel: Stomach is within normal limits. Appendix appears normal. No evidence of bowel wall thickening, distention, or inflammatory changes. Vascular/Lymphatic: Mild aortic atherosclerosis without aneurysm. No significantly enlarged lymph nodes. Reproductive: Uterus is unremarkable. Ring-enhancing probable involuting left adnexal cyst. Other: No free air. Trace free fluid in the pelvis. 2 adjacent linear densities in the right abdominal subcutaneous fat measuring approximately 8.4 cm and 5 cm. No surrounding inflammatory change. Musculoskeletal: No acute or suspicious osseous abnormality. IMPRESSION: 1. No CT evidence for acute intra-abdominal or pelvic abnormality. 2. Trace free fluid in the pelvis. Probable involuting left adnexal cyst. 3. Large curvilinear densities within the subcutaneous fat of the  right abdominal wall of uncertain etiology, has an appearance suggesting foreign body/foreign material as opposed to dystrophic calcification. No acute inflammatory changes surrounding this finding. Electronically Signed   By: Jasmine Pang M.D.   On: 05/20/2018 00:19    Procedures Procedures (including critical care time)  Medications Ordered in ED Medications  LORazepam (ATIVAN) injection 2 mg (0 mg Intravenous Hold 05/20/18 1513)  ketorolac (TORADOL) 15 MG/ML injection 15 mg (has no administration in time range)  levETIRAcetam (KEPPRA) IVPB 1000 mg/100 mL premix (0 mg Intravenous Stopped 05/20/18 1617)  sodium chloride 0.9 % bolus 500 mL (0 mLs Intravenous Stopped 05/20/18 1617)  iohexol (OMNIPAQUE) 300 MG/ML solution 100 mL (100 mLs Intravenous Contrast Given 05/20/18 1512)     Initial Impression / Assessment and Plan / ED Course  I have reviewed the triage vital signs and the nursing notes.  Pertinent labs & imaging results that were available during my care of the patient were reviewed by me and considered in my medical decision making (see chart for details).  Clinical Course as of May 19 1616  Wed May 20, 2018  1618 Results from the ER workup discussed with the patient face to face and all questions answered to the best of my ability. Patient's aunt also made aware of the work-up.   [AN]    Clinical Course User Index [AN] Derwood Kaplan, MD       43 year old female with history of strokes and seizure comes in with chief complaint of seizure-like activity. It appears that patient has history of left MCA stroke with right-sided deficit and she is a phasic.  She is not able to provide meaningful history but it seems like she has not had a seizure in a while.  On my evaluation patient is at baseline normal according to patient's aunt.  She is noted to have abdominal discomfort and it appears that she is complaining of pain with urination.  We will give her a gram of Keppra now.   CT head, C-spine ordered because we are unable to clear them clinically.  CT abdomen and pelvis ordered because of significant abdominal discomfort. We were unable to connect with the [facility] facility on 2 attempts.  If the work-up in the ER is negative then we anticipate discharge, as long as patient does not have recurrence of the seizure.  Final Clinical Impressions(s) / ED Diagnoses   Final diagnoses:  Seizures [facility])    ED Discharge Orders    None       Derwood Kaplan, MD 05/20/18 1432    Derwood Kaplan, MD 05/20/18 [phone]

## 2018-05-20 NOTE — Discharge Instructions (Addendum)
We saw you in the ER for seizure. All the results in the ER are normal, labs and imaging. We are not sure what is causing your symptoms.  There appears to be no abnormality in your lab work-up or signs of infection.  CT scans are also normal.  The workup in the ER is not complete, and is limited to screening for life threatening and emergent conditions only, so please see a primary care doctor for further evaluation.

## 2018-05-20 NOTE — ED Notes (Signed)
[facility] called for transport.  

## 2018-05-21 LAB — URINE CULTURE

## 2018-06-03 ENCOUNTER — Telehealth: Payer: Self-pay

## 2018-06-03 ENCOUNTER — Telehealth: Payer: Self-pay | Admitting: Adult Health

## 2018-06-03 NOTE — Telephone Encounter (Signed)
I called.

## 2018-06-03 NOTE — Telephone Encounter (Signed)
Attempted to call patient to reschedule her appointment to a later date, patient's phone is not taking calls or messages at this time. I will attempt to call back later today.

## 2018-06-03 NOTE — Telephone Encounter (Signed)
Please reschedule patient for approximately 2-3 months out as I am unable to perform telemedicine visit as she is currently residing at [facility] and has residual stroke deficits of severe expressive aphasia.  I do not believe she is currently in emergent need to be evaluated in office and due to COVID19 pandemic, we are currently limiting nonemergent face-to-face visits.  Please advise her to notify office with any questions or concerns and to continue on current regimen at this time

## 2018-06-03 NOTE — Telephone Encounter (Signed)
I called pts friend listed. They stated pt is in [facility] nursing home. I called nursing home and spoke with [place] Rn at facility. I explained due to COVID 19 we are not seeing pts except for tele visit. I explained due to pts aphasia its better if she r/s for office visit in 2 months. RN stated pt has had another seizure and is falling and is on keppra. I stated to monitor pt with the medical mD at facility and we will see her in May 2020. Appt made for May 2020.Christi  wrote down appt.

## 2018-06-04 ENCOUNTER — Ambulatory Visit: Payer: Self-pay | Admitting: Adult Health

## 2018-07-16 ENCOUNTER — Telehealth: Payer: Self-pay

## 2018-07-16 DIAGNOSIS — Z0271 Encounter for disability determination: Secondary | ICD-10-CM

## 2018-07-16 NOTE — Telephone Encounter (Signed)
I called [facility]. I stated pt was r/s back in March due to COVID 19. Also she was reschedule for Jul 21 2018 at 915am. I stated we are still doing video visit for the pt due to COVID 9. I spoke with [facility] and explained we are just doing video.[employer] Rn they are doing some video with pts medical providers.She transferred me to [facility] a Merchandiser, retail at [place].I explain to [facility] we are doing doxy video visit.I explain we can send link to a staff cell phone or send a email to a lap top.Once they receive the link just click on it and type in pts first and last name and start the video. Holli stated they are only doing zoom and facetime from a company ipap. She stated that's what the facility approve for pts medical appts. I stated we can send a email to staff who will be doing the visit with the patient. I stated we can also text the link to the person cell phone if its a smart phone.  She stated they will not to be able to do appt with the doxy link because of how the ipap is set up. I stated we are trying to serve the pt and keep her appt to avoid keep rescheduling her.Holli stated let me transfer you again to see what can be done and put me on hold. I was hold for 8 minutes and no one came back to the phone.

## 2018-07-16 NOTE — Telephone Encounter (Signed)
I called [facility] again at [phone] and explain that im trying to schedule video visit. I was transfer and the phone just rang and no one pick up.

## 2018-07-16 NOTE — Telephone Encounter (Signed)
I called back again and explain to operator I was on hold for several minutes and transfer several times. I explain why I was calling about a video visit with Tammie Bumps NP. They transfer me again and the phone just rang again. Again no one pick up the line.

## 2018-07-16 NOTE — Telephone Encounter (Signed)
Noted! Thank you

## 2018-07-21 ENCOUNTER — Other Ambulatory Visit: Payer: Self-pay

## 2018-07-21 ENCOUNTER — Ambulatory Visit: Payer: Medicaid Other | Admitting: Adult Health

## 2018-07-29 ENCOUNTER — Ambulatory Visit: Payer: Self-pay | Admitting: Adult Health

## 2018-11-26 ENCOUNTER — Encounter: Payer: Self-pay | Admitting: Adult Health

## 2018-11-26 ENCOUNTER — Other Ambulatory Visit: Payer: Self-pay

## 2018-11-26 ENCOUNTER — Ambulatory Visit (INDEPENDENT_AMBULATORY_CARE_PROVIDER_SITE_OTHER): Payer: Medicaid Other | Admitting: Adult Health

## 2018-11-26 VITALS — BP 135/89 | HR 68 | Temp 97.7°F | Ht 61.0 in

## 2018-11-26 DIAGNOSIS — R4701 Aphasia: Secondary | ICD-10-CM | POA: Diagnosis not present

## 2018-11-26 DIAGNOSIS — I611 Nontraumatic intracerebral hemorrhage in hemisphere, cortical: Secondary | ICD-10-CM | POA: Diagnosis not present

## 2018-11-26 DIAGNOSIS — G8191 Hemiplegia, unspecified affecting right dominant side: Secondary | ICD-10-CM

## 2018-11-26 DIAGNOSIS — I10 Essential (primary) hypertension: Secondary | ICD-10-CM

## 2018-11-26 DIAGNOSIS — Z9889 Other specified postprocedural states: Secondary | ICD-10-CM

## 2018-11-26 DIAGNOSIS — R569 Unspecified convulsions: Secondary | ICD-10-CM

## 2018-11-26 NOTE — Patient Instructions (Addendum)
Highly recommend restart of physical, occupation and speech therapy   Call [facility] to schedule f/u visit  [phone]  Continue aspirin 81 mg daily  for secondary stroke prevention  Continue on keppra for seizure prevention  Continue to follow up with PCP regarding cholesterol and blood pressure management   Continue to monitor blood pressure at home  Maintain strict control of hypertension with blood pressure goal below 130/90, diabetes with hemoglobin A1c goal below 6.5% and cholesterol with LDL cholesterol (bad cholesterol) goal below 70 mg/dL. I also advised the patient to eat a healthy diet with plenty of whole grains, cereals, fruits and vegetables, exercise regularly and maintain ideal body weight.  Followup in the future with me in 3 months or call earlier if needed       Thank you for coming to see [place] at [facility]. I hope we have been able to provide you high quality care today.  You may receive a patient satisfaction survey over the next few weeks. We would appreciate your feedback and comments so that we may continue to improve ourselves and the health of our patients.

## 2018-11-26 NOTE — Progress Notes (Signed)
I agree with the above plan 

## 2018-11-26 NOTE — Progress Notes (Signed)
[facility] [address] [place]. Tammie Miles [id number] [phone]       OFFICE FOLLOW UP NOTE  Ms. Tammie GibsonKelly [name] Date of Birth:  [birth year] Medical Record Number:  [id number]   Reason for visit: Left MCA infarct with hemorrhagic transformation  CHIEF COMPLAINT:  Chief Complaint  Patient presents with   Follow-up    3 mon f/u. Aunt present. Rm 9. No new concerns at this time.    HPI:  Stroke admission: Tammie Miles is a 43 y.o. Femalewith PMH of HTN on hypertensive medications, antidepressants, stimulants, and Xanax which are all prescribed by her primary physician.  Per discharge summary notes by Dr. Pearlean BrownieSethi, after awakening from a nap on 11/26/2017 she was noted to not be acting correctly and was dropping things.  Her 43-year-old daughter called the father multiple times and when he went and checked on her he believes that she was drunk and she was obtunded but moving her arms.  The following morning on 11/27/2017, she remained obtunded and the husband called 911 where she was brought into the emergency room via EMS.  Initial CT head reviewed and showed large area of hemorrhagic left basal ganglia and left frontal lobe was noted with a 5.8 x 3.5 cm along with 15 mm shift from the left to right with early herniation.  Neurosurgery was immediately called for decompression.  Initial hematoma evacuation performed and after neuro worsening, hemicraniectomy was performed a few days later.  Initial insult felt to be ischemic with large hemorrhagic transformation and continued bleeding.  Unfortunately, patient had a prolonged hospitalization due to lack of family support and insurance for rehab.  Patient was started on Keppra for seizure prevention after episode of stiffness and tongue biting on 12/03/2017.  EEG did not show seizure activity.  Due to acute respiratory insufficiency, patient was intubated and did undergo tracheostomy on 12/09/2017.  Blood pressure initially found to be  elevated on admission but stabilized throughout hospitalization.  Due to continued dysphasia, PEG tube placed on 12/09/2017 for nutritional support.  Aspirin 81 mg initiated for secondary stroke prevention.  Therapies recommended SNF but due to lack of bed placement, patient was transferred to CIR for ongoing PT/OT/ST.  During CIR stay, patient was decannulated on 01/09/2018.  Due to continued therapy needs, [facility] on 01/20/2018.  Initial visit 03/05/2018: Patient is being seen today for hospital follow-up and is accompanied by her aunt.  [facility]  She continues to receive PT/OT/ST. she continues to have LUE flaccid and expressive > receptive aphasia.  Severe expressive aphasia present with only being able to say "no" but is able to nod her head appropriately.  Mild to moderate receptive aphasia present.  She has had PEG tube removed and is currently obtaining all nutrition and pills orally.  She denies any complication in doing so and denies any aspiration or coughing with eating.  She is able to ambulate without assistance and denies any recent falls.  She needs minimal assistance with ADLs such as bathing and dressing.  She does feel as though she has been improving.  Continues on aspirin 81 mg without side effects of bleeding or bruising.  She continues on Keppra without recurrent seizures and has been tolerating well.  Blood pressure today satisfactory 135/82.  She denies follow-up appointment with neurosurgery since hospital discharge as recommended.  She does have skullcap placed right side of  abdomen.  No further concerns at this time.  Denies new or worsening stroke/TIA symptoms.  Update 11/26/2018: [name] is being seen today for stroke follow-up accompanied by her aunt.  Prior follow-up appointments canceled/rescheduled due to COVID-19 safety restrictions and difficulty  with scheduling virtual visit.  She continues to reside at [facility].  Residual stroke deficits of right hemiparesis and severe expressive > mild to moderate receptive aphasia.  She is ambulatory for very short distances and assistance needed.  Aunt states she was doing well with improvement of ambulation but this worsened after seizure activity in 05/2018 and lack of therapy.  She was admitted to ED on 05/20/2018 due to seizure activity but denies additional seizure since that time.  She continues on Keppra without side effects.  Referral placed at prior visit to follow-up with neurosurgery but unfortunately due to difficulty getting a hold of patient/facility, appointment was never made. Skullflap located right abdomen.  She continues on aspirin 81 mg daily for secondary stroke prevention without side effects.  Blood pressure today satisfactory at 135/89.  Denies new or worsening stroke/TIA symptoms.    ROS:   14 system review of systems performed and negative with exception of speech difficulty, weakness  PMH:  Past Medical History:  Diagnosis Date   ADHD    Anxiety    Depression    Hypertension    Seizures (HCC)     PSH:  Past Surgical History:  Procedure Laterality Date   CRANIOTOMY Left 11/27/2017   Procedure: CRANIOTOMY for Evacuation of Intracranial Hemorrhage;  Surgeon: Lisbeth Renshaw, MD;  Location: [facility] OR;  Service: Neurosurgery;  Laterality: Left;  CRANIOTOMY for Evacuation of Intracranial Hemorrhage   CRANIOTOMY Left 11/28/2017   Procedure: Decompressive craniectomy;  Surgeon: Maeola Harman, MD;  Location: [facility] OR;  Service: Neurosurgery;  Laterality: Left;   ESOPHAGOGASTRODUODENOSCOPY N/A 12/09/2017   Procedure: ESOPHAGOGASTRODUODENOSCOPY (EGD);  Surgeon: Violeta Gelinas, MD;  Location: [facility] ENDOSCOPY;  Service: General;  Laterality: N/A;   PEG PLACEMENT N/A 12/09/2017   Procedure: PERCUTANEOUS ENDOSCOPIC GASTROSTOMY (PEG) PLACEMENT;  Surgeon: Violeta Gelinas, MD;  Location: [facility] ENDOSCOPY;  Service: General;  Laterality: N/A;    Social History:  Social History   Socioeconomic History   Marital status: Married    Spouse name: Not on file   Number of children: 1   Years of education: Not on file   Highest education level: Not on file  Occupational History   Not on file  Social Needs   Financial resource strain: Not on file   Food insecurity    Worry: Not on file    Inability: Not on file   Transportation needs    Medical: Not on file    Non-medical: Not on file  Tobacco Use   Smoking status: Never Smoker   Smokeless tobacco: Never Used  Substance and Sexual Activity   Alcohol use: Yes   Drug use: Not on file   Sexual activity: Yes  Lifestyle   Physical activity    Days per week: Not on file    Minutes per session: Not on file   Stress: Not on file  Relationships   Social connections    Talks on phone: Not on file    Gets together: Not on file    Attends religious service: Not on file    Active member of club or organization: Not on file    Attends meetings of clubs or organizations: Not on file    Relationship status: Not on  file   Intimate partner violence    Fear of current or ex partner: Not on file    Emotionally abused: Not on file    Physically abused: Not on file    Forced sexual activity: Not on file  Other Topics Concern   Not on file  Social History Narrative   Not on file    Family History:  Family History  Problem Relation Age of Onset   Hypertension Mother    Hypertension Father     Medications:   Current Outpatient Medications on File Prior to Visit  Medication Sig Dispense Refill   acetaminophen (TYLENOL) 325 MG tablet Take 2 tablets (650 mg total) by mouth every 6 (six) hours as needed. 30 tablet 0   amLODipine (NORVASC) 10 MG tablet Take 1 tablet (10 mg total) by mouth daily.     aspirin 81 MG chewable tablet Place 1 tablet (81 mg total) into feeding tube daily.  (Patient taking differently: Chew 81 mg by mouth daily. )     Cobalamin Combinations (VITAMIN B12-FOLIC ACID PO) Take 1 tablet by mouth daily.     diclofenac sodium (VOLTAREN) 1 % GEL Apply 2 g topically daily as needed (pain).     dicyclomine (BENTYL) 20 MG tablet Take 1 tablet (20 mg total) by mouth every 8 (eight) hours as needed for spasms (Abdominal cramping). 15 tablet 0   famotidine (PEPCID) 20 MG tablet Take 20 mg by mouth 2 (two) times daily.     feeding supplement (BOOST HIGH PROTEIN) LIQD Take 1 Container by mouth 2 (two) times daily between meals.     ibuprofen (ADVIL,MOTRIN) 400 MG tablet Take 1 tablet (400 mg total) by mouth every 6 (six) hours as needed. 30 tablet 0   labetalol (NORMODYNE) 300 MG tablet Take 1 tablet (300 mg total) by mouth 3 (three) times daily.     levETIRAcetam (KEPPRA) 1000 MG tablet Take 1,000 mg by mouth every 12 (twelve) hours.     Multiple Vitamins-Minerals (CERTA PLUS PO) Take 1 tablet by mouth daily.     ondansetron (ZOFRAN ODT) 4 MG disintegrating tablet Take 1 tablet (4 mg total) by mouth every 6 (six) hours as needed for nausea or vomiting. 20 tablet 0   psyllium (HYDROCIL/METAMUCIL) 95 % PACK Take 1 packet by mouth 2 (two) times daily. 213 each    folic acid (FOLVITE) 1 MG tablet Place 1 tablet (1 mg total) into feeding tube daily. (Patient not taking: Reported on 05/20/2018)     No current facility-administered medications on file prior to visit.     Allergies:  No Known Allergies   Physical Exam  Vitals:   11/26/18 1331  BP: 135/89  Pulse: 68  Temp: 97.7 F (36.5 C)  TempSrc: Oral  Height: 5\' 1"  (1.549 m)   Body mass index is 29.02 kg/m. No exam data present  General: well developed, well nourished, pleasant African-American female, seated, in no evident distress Head: head normocephalic and atraumatic.   Neck: supple with no carotid or supraclavicular bruits Cardiovascular: regular rate and rhythm, no  murmurs Musculoskeletal: no deformity Skin:  no rash/petichiae; skull flap present RLQ with healed incision; healed incision at post PEG site and post trach site Vascular:  Normal pulses all extremities  Neurologic Exam Mental Status: Awake and fully alert.  Severe expressive aphasia with mild to moderate receptive aphasia.  No evidence of cognitive difficulties.  Mood and affect appropriate and cooperative with exam Cranial Nerves: Pupils equal, briskly reactive  to light. Extraocular movements full without nystagmus. Visual fields full unable to assess as patient unable to follow commands appropriately. Hearing intact. Facial sensation intact.  Right lower facial paralysis Motor:  RUE: 1/5 with spasticity; RLE: 5/5 with weak ankle dorsiflexion and spasticity. full strength left upper and lower extremity Sensory.: intact to touch , pinprick , position and vibratory sensation.  Coordination: Rapid alternating movements normal in left upper extremity. Finger-to-nose performed accurately in left upper extremity and heel-to-shin performed accurately . Gait and Station: Arises from chair without difficulty. Stance is normal. Gait demonstrates hemiplegic gait and able to take a couple steps without assistance Reflexes: 2+ right side and 1+ right side. Toes downgoing.      Diagnostic Data (Labs, Imaging, Testing)  CT HEAD WO CONTRAST 11/27/2017 IMPRESSION: Large area of hemorrhage in the left basal ganglia and left frontal lobe, 5.8 x 3.5 cm. 15 mm of left-to-right midline shift.  CT ANGIO HEAD W OR WO CONTRAST 11/27/2017 IMPRESSION: 1. Positive for left MCA M1 large vessel occlusion, and also severe stenosis at the left ICA terminus, superimposed on the relatively large acute left hemisphere intra-axial hemorrhage (estimated blood volume 43 mL). Vasospasm suspected in the proximal ACAs. This constellation might indicate an acute large vessel Left MCA infarct with malignant hemorrhagic  transformation. 2. Negative for intracranial aneurysm, CTA spot sign, or evidence of vascular malformation in association with the acute hemorrhage. 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 3. Stable intracranial mass effect since 0938 hours today, with 16 mm of leftward midline shift and trapping of the right lateral ventricle.  CT HEAD WO CONTRAST 11/27/2017 IMPRESSION: 1. Interval LEFT frontal craniotomy for basal ganglia hematoma evacuation, small amount of residual blood products and regional edema. 7 mm residual LEFT to RIGHT midline shift, resolved RIGHT ventricular entrapment. 2. Trace RIGHT holo hemispheric and tentorial subdural hematomas. Global edema.  MR BRAIN WO CONTRAST 11/27/2017 IMPRESSION: 1. Postoperative changes from previous left frontal craniotomy for evacuation of left basal ganglia hematoma. Residual blood products within the evacuation cavity with persistent surrounding vasogenic edema and regional mass effect. Associated 8-9 mm of right-to-left midline shift with mild left uncal herniation similar to previous. No hydrocephalus or ventricular trapping. 2. Underlying evolving large acute ischemic left MCA territory infarct involving the majority of the left MCA territory. 3. Trace right cerebral and tentorial subdural hematoma without mass effect. 4. Right sphenoid sinusitis.  CT HEAD WO CONTRAST 11/28/2017 IMPRESSION: 1. Stable when compared to yesterday. 2. Left MCA territory infarct with evacuated hematoma. Midline shift measures 8 mm. 3. 2-3 mm subdural hematoma along the right cerebral convexity without interval increase.  CT HEAD WO CONTRAST 11/29/2017 IMPRESSION: 1. Large left MCA infarct is stable in distribution. 2. Interval left frontal craniectomy with mild herniation of left frontal lobe through the defect. 3.  Resolved uncal herniation, improved patency of left lateral ventricle, and 6 mm left-to-right midline shift, previously 8 mm. 4. Partial interval dispersion of acute hemorrhage in the left basal ganglia. Subcentimeter density anterior to the prior position of blood products possibly representing retracted clot or interval hemorrhage, attention at follow-up recommended.  CT HEAD WO CONTRAST 12/02/2017 IMPRESSION: 1. Left MCA distribution infarction is stable in distribution. Mild increased edema of the infarct with increased herniation of the left frontal lobe via the left frontal craniectomy. 2. Stable 6 mm left-to-right midline shift and partial effacement of left lateral ventricle.  3. Stable small hematoma within the left basal ganglia. 4. No new acute intracranial abnormality identified.    ASSESSMENT: Tammie Miles is a 43 y.o. 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  Initial insult felt to be ischemic with large hemorrhagic transformation and continued bleeding.  Hospitalization complicated by acute respiratory insufficiency status post trach with decannulation, seizure, hypertensive emergency, anemia, and dysphasia status post PEG. Vascular risk factors include HTN and depression.  Residual deficits of severe expressive aphasia with mild to moderate receptive aphasia and right spastic hemiparesis    PLAN:  1. Left MCA infarct with hemorrhagic transformation status post hemicraniotomy: Continue aspirin 81 mg daily for secondary stroke prevention. Maintain strict control of hypertension with blood pressure goal below 130/90, diabetes with hemoglobin A1c goal below 6.5% and cholesterol with LDL cholesterol (bad cholesterol) goal below 70 mg/dL.  I also advised the  patient to eat a healthy diet with plenty of whole grains, cereals, fruits and vegetables, exercise regularly with at least 30 minutes of continuous activity daily and maintain ideal body weight.  Advised patient to continue PT/OT/ST for continued deficits 2. Right spastic hemiparesis/global aphasia: Highly recommend PT and SLP for potential improvement of deficits.  Discussion with patient and aunt regarding possible minimal improvement as it has now been 1 year since her stroke but this can be further evaluated by therapy 3. S/p hemicraniotomy: Provided aunt and facility with [facility] contact information to schedule follow-up visit 4. HTN: Advised to continue current treatment regimen.  Today's BP 135/82.  Advised to continue to monitor at home along with continued follow-up with PCP for management 5. Seizure: Continue Keppra 1 g twice daily for seizure prophylaxis   Follow up in 3 months or call earlier if needed   Greater than 50% of time during this 25 minute visit was spent on counseling, explanation of diagnosis of left MCA infarct with hemorrhagic transformation status post hemicraniectomy, reviewing risk factor management of HTN and seizures, planning of further management along with potential future management, and discussion with patient and family answering all questions.    George Hugh, AGNP-BC  [facility] [address] [place], [place] [address]  Phone [phone] Fax [phone] Note: This document was prepared with digital dictation and possible smart phrase technology. Any transcriptional errors that result from this process are unintentional.

## 2019-01-13 ENCOUNTER — Other Ambulatory Visit: Payer: Self-pay | Admitting: Neurosurgery

## 2019-01-14 ENCOUNTER — Other Ambulatory Visit: Payer: Self-pay | Admitting: Neurosurgery

## 2019-01-20 ENCOUNTER — Other Ambulatory Visit (HOSPITAL_COMMUNITY)
Admission: RE | Admit: 2019-01-20 | Discharge: 2019-01-20 | Disposition: A | Discharge status: Home / Self Care | Payer: Medicaid Other | Source: Ambulatory Visit | Attending: Neurosurgery | Admitting: Neurosurgery

## 2019-01-20 DIAGNOSIS — Z01812 Encounter for preprocedural laboratory examination: Secondary | ICD-10-CM | POA: Diagnosis not present

## 2019-01-20 DIAGNOSIS — Z20828 Contact with and (suspected) exposure to other viral communicable diseases: Secondary | ICD-10-CM | POA: Diagnosis not present

## 2019-01-20 LAB — SARS CORONAVIRUS 2 (TAT 6-24 HRS): SARS Coronavirus 2: NEGATIVE

## 2019-01-21 ENCOUNTER — Other Ambulatory Visit: Payer: Self-pay

## 2019-01-21 ENCOUNTER — Encounter (HOSPITAL_COMMUNITY): Payer: Self-pay | Admitting: *Deleted

## 2019-01-21 NOTE — Pre-Procedure Instructions (Signed)
    ADDYSON TRAUB  01/21/2019                  Mrs Felan is to arrive at [facility] Entrance at 5:30 AM     Call this number if you have problems the morning of surgery: [phone]  This is the number for the Pre- Surgical Desk.  >>>>>Please send patient's Medication Record with medications administrated documentation. ( this information is required prior to OR. This includes medications that may have been on hold for surgery)<<<<<\   Remember:  Do not eat or drink after midnight.    Take these medicines the morning of surgery with A SIP OF WATER  amLODipine (NORVASC)   atenolol (TENORMIN) famotidine (PEPCID) levETIRAcetam (KEPPRA)  If needed: acetaminophen (TYLENOL) or  HYDROcodone-acetaminophen (NORCO/VICODIN)               Shower, Brush teeth, wear clean clothes  Do not wear jewelry, make-up or nail polish.  Do not wear lotions, powders, or perfumes, or deodorant.  Do not shave 48 hours prior to surgery  Do not bring valuables to the hospital.  [facility] is not responsible for any belongings or valuables.

## 2019-01-21 NOTE — H&P (Signed)
Chief Complaint   Skull defect s/p craniectomy  HPI   HPI: Tammie Miles is a 43 y.o. 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  She remains right hemi plegic with global aphasia.  She presents today for cranioplasty.  Patient Active Problem List   Diagnosis Date Noted  . Cerebral edema (HCC) 01/16/2018  . At high risk for injury related to fall 01/16/2018  . Seizures (HCC) 01/16/2018  . Attention deficit hyperactivity disorder (ADHD) 01/16/2018  . Generalized anxiety disorder 01/16/2018  . Depression 01/16/2018  . Tracheostomy in place [facility])   . Pressure injury of skin 12/29/2017  . Altered mental status   . Central venous catheter in place   . 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 Hypertensive emergency 12/02/2017  . Anemia 12/02/2017  . Leukocytosis 12/02/2017  . Acute intra-cranial hemorrhage (HCC)   . Respiratory failure (HCC)   . ICH (intracerebral hemorrhage) (HCC) 11/27/2017  . Intracranial hemorrhage (HCC) 11/27/2017    PMH: Past Medical History:  Diagnosis Date  . ADHD   . Anxiety   . Depression   . Hypertension   . Seizures (HCC)     PSH: Past Surgical History:  Procedure Laterality Date  . CRANIOTOMY Left 11/27/2017   Procedure: CRANIOTOMY for Evacuation of Intracranial Hemorrhage;  Surgeon: Lisbeth Renshaw, MD;  Location: [facility] OR;  Service: Neurosurgery;  Laterality: Left;  CRANIOTOMY for Evacuation of Intracranial Hemorrhage  . CRANIOTOMY Left 11/28/2017   Procedure: Decompressive craniectomy;  Surgeon: Maeola Harman, MD;  Location: [facility] OR;  Service: Neurosurgery;   Laterality: Left;  . ESOPHAGOGASTRODUODENOSCOPY N/A 12/09/2017   Procedure: ESOPHAGOGASTRODUODENOSCOPY (EGD);  Surgeon: Violeta Gelinas, MD;  Location: [facility] ENDOSCOPY;  Service: General;  Laterality: N/A;  . PEG PLACEMENT N/A 12/09/2017   Procedure: PERCUTANEOUS ENDOSCOPIC GASTROSTOMY (PEG) PLACEMENT;  Surgeon: Violeta Gelinas, MD;  Location: [facility] ENDOSCOPY;  Service: General;  Laterality: N/A;    No medications prior to admission.    SH: Social History   Tobacco Use  . Smoking status: Never Smoker  . Smokeless tobacco: Never Used  Substance Use Topics  . Alcohol use: Yes  . Drug use: Not on file    MEDS: Prior to Admission medications   Medication Sig Start Date End Date Taking? Authorizing Provider  acetaminophen (TYLENOL) 500 MG tablet Take 1,000 mg by mouth 3 (three) times daily.   Yes [provider]  amLODipine (NORVASC) 10 MG tablet Take 1 tablet (10 mg total) by mouth daily. 01/08/18  Yes Layne Benton, NP  aspirin 81 MG chewable tablet Place 1 tablet (81 mg total) into feeding tube daily. Patient taking differently: Chew 81 mg by mouth daily.  01/08/18  Yes Layne Benton, NP  atenolol (TENORMIN) 50 MG tablet Take 50 mg by mouth 2 (two) times daily.   Yes [provider]  famotidine (PEPCID) 20 MG tablet Take 20 mg by mouth daily.    Yes [provider]  folic acid (FOLVITE) 1 MG tablet Place 1 tablet (1 mg total) into feeding tube daily. Patient taking differently: Take 1 mg by mouth daily.  01/08/18  Yes Layne Benton, NP  HYDROcodone-acetaminophen (NORCO/VICODIN) 5-325 MG tablet Take 1 tablet  by mouth 2 (two) times daily as needed for moderate pain (pain).   Yes [provider]  levETIRAcetam (KEPPRA) 1000 MG tablet Take 1,000 mg by mouth every 12 (twelve) hours.   Yes [provider]  Melatonin 3 MG TABS Take 3 mg by mouth at bedtime.   Yes [provider]  polyethylene glycol (MIRALAX / GLYCOLAX) 17 g packet Take 17 g  by mouth daily as needed for moderate constipation.   Yes [provider]  psyllium (HYDROCIL/METAMUCIL) 95 % PACK Take 1 packet by mouth 2 (two) times daily. Patient taking differently: Take 1 packet by mouth 2 (two) times daily as needed for moderate constipation.  01/08/18  Yes Donzetta Starch, NP  sennosides-docusate sodium (SENOKOT-S) 8.6-50 MG tablet Take 2 tablets by mouth daily as needed for constipation.   Yes [provider]    ALLERGY: No Known Allergies  Social History   Tobacco Use  . Smoking status: Never Smoker  . Smokeless tobacco: Never Used  Substance Use Topics  . Alcohol use: Yes     Family History  Problem Relation Age of Onset  . Hypertension Mother   . Hypertension Father      ROS   ROS  Exam   There were no vitals filed for this visit. General appearance: WDWN, NAD Left skull defect is sunken, incisions are well healed Global aphasia and right hemiplegia Bone flap was implanted in the right lower quadrant, with well-healed incision.  Results - Imaging/Labs   Results for orders placed or performed during the hospital encounter of 01/20/19 (from the past 48 hour(s))  SARS CORONAVIRUS 2 (TAT 6-24 HRS) Nasopharyngeal Nasopharyngeal Swab     Status: None   Collection Time: 01/20/19 11:42 AM   Specimen: Nasopharyngeal Swab  Result Value Ref Range   SARS Coronavirus 2 NEGATIVE NEGATIVE    Comment: (NOTE) SARS-CoV-2 target nucleic acids are NOT DETECTED. The SARS-CoV-2 RNA is generally detectable in upper and lower respiratory specimens during the acute phase of infection. Negative results do not preclude SARS-CoV-2 infection, do not rule out co-infections with other pathogens, and should not be used as the sole basis for treatment or other patient management decisions. Negative results must be combined with clinical observations, patient history, and epidemiological information. The expected result is Negative. Fact Sheet for  Patients: SugarRoll.be Fact Sheet for Healthcare Providers: https://www.woods-mathews.com/ This test is not yet approved or cleared by the [place] FDA and  has been authorized for detection and/or diagnosis of SARS-CoV-2 by FDA under an Emergency Use Authorization (EUA). This EUA will remain  in effect (meaning this test can be used) for the duration of the COVID-19 declaration under Section 56 4(b)(1) of the Act, 21 U.S.C. section 360bbb-3(b)(1), unless the authorization is terminated or revoked sooner. Performed at [facility], [place] [address] [place], [place] [address]     No results found.  Impression/Plan   43 y.o. female 1 year status post decompressive left craniectomy for MCA territory infarction.  Her craniectomy flap is sunken.   We will proceed with left cranioplasty. Given patient's global aphasia, her next of kin (her aunt),  Has provided consent to proceed with surgery.  Risks, benefits alternatives were discussed.  She stated understanding.  Ferne Reus, PA-C [facility] [employer]

## 2019-01-21 NOTE — Progress Notes (Addendum)
I spoke to [place] , Ms Guilfoil' nurse. Steffanie Dunn reports that patient is alert, no verbal, can shake head to yes and no questions.Patient can ambulate a little by herself. Ms Witkop is able to use bathroom when needed.  Ms Jone stested negative for Covid, she is in a semi -private room and roommate tested negative for Covid also.  Ms Pigue has weakness in right arm.   Marita [place] reports that Ms Zito last seizure was at least 4 months ago.  I was told by [facility] that Ms Fittro boyfriend and her aunt both told staff that they are coming to the hospital tomorrow , the staff told both that only 1 person is allowed.  Boyfriend's name is Richarda Osmond, Aunt's name is Howie Ill.  I will give this information to the reception desk.

## 2019-01-21 NOTE — Anesthesia Preprocedure Evaluation (Addendum)
Anesthesia Evaluation  Patient identified by MRN, date of birth, ID band Patient awake    Reviewed: Allergy & Precautions, H&P , NPO status , Patient's Chart, lab work & pertinent test results, reviewed documented beta blocker date and time   Airway Mallampati: III  TM Distance: >3 FB   Mouth opening: Limited Mouth Opening  Dental no notable dental hx. (+) Poor Dentition, Dental Advisory Given, Missing,    Pulmonary  VDRF   Pulmonary exam normal breath sounds clear to auscultation       Cardiovascular hypertension, Pt. on medications and Pt. 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    She remains right hemi plegic with global aphasia.  CVA, Residual Symptoms    GI/Hepatic Neg liver ROS, GERD  Medicated and Controlled,  Endo/Other  negative endocrine ROS  Renal/GU negative Renal ROS  negative genitourinary   Musculoskeletal negative musculoskeletal ROS (+)   Abdominal Normal abdominal exam  (+)   Peds  Hematology negative hematology ROS (+) anemia ,   Anesthesia Other Findings   Reproductive/Obstetrics negative OB ROS                           Anesthesia Physical  Anesthesia Plan  ASA: III  Anesthesia Plan: General   Post-op Pain Management:    Induction: Intravenous  PONV Risk Score and Plan: 3 and Treatment may vary due to age or medical condition  Airway Management Planned: Oral ETT  Additional Equipment: None  Intra-op Plan:   Post-operative Plan: Extubation in OR  Informed Consent: I have  reviewed the patients History and Physical, chart, labs and discussed the procedure including the risks, benefits and alternatives for the proposed anesthesia with the patient or authorized representative who has indicated his/her understanding and acceptance.     Dental advisory given  Plan Discussed with: CRNA  Anesthesia Plan Comments:        Anesthesia Quick Evaluation

## 2019-01-22 ENCOUNTER — Inpatient Hospital Stay (HOSPITAL_COMMUNITY): Payer: Medicaid Other | Admitting: Anesthesiology

## 2019-01-22 ENCOUNTER — Inpatient Hospital Stay (HOSPITAL_COMMUNITY)
Admission: RE | Admit: 2019-01-22 | Discharge: 2019-01-25 | DRG: 516 | Disposition: A | Discharge status: Skilled Nursing Facility | Payer: Medicaid Other | Source: Ambulatory Visit | Attending: Neurosurgery | Admitting: Neurosurgery

## 2019-01-22 ENCOUNTER — Encounter (HOSPITAL_COMMUNITY): Payer: Self-pay | Admitting: Certified Registered Nurse Anesthetist

## 2019-01-22 ENCOUNTER — Other Ambulatory Visit: Payer: Self-pay

## 2019-01-22 ENCOUNTER — Encounter (HOSPITAL_COMMUNITY)
Admission: RE | Disposition: A | Discharge status: Skilled Nursing Facility | Payer: Medicaid Other | Source: Ambulatory Visit | Attending: Neurosurgery

## 2019-01-22 DIAGNOSIS — I69151 Hemiplegia and hemiparesis following nontraumatic intracerebral hemorrhage affecting right dominant side: Secondary | ICD-10-CM | POA: Diagnosis not present

## 2019-01-22 DIAGNOSIS — R569 Unspecified convulsions: Secondary | ICD-10-CM | POA: Diagnosis present

## 2019-01-22 DIAGNOSIS — Z7982 Long term (current) use of aspirin: Secondary | ICD-10-CM | POA: Diagnosis not present

## 2019-01-22 DIAGNOSIS — Z930 Tracheostomy status: Secondary | ICD-10-CM | POA: Diagnosis not present

## 2019-01-22 DIAGNOSIS — F909 Attention-deficit hyperactivity disorder, unspecified type: Secondary | ICD-10-CM | POA: Diagnosis present

## 2019-01-22 DIAGNOSIS — Z79899 Other long term (current) drug therapy: Secondary | ICD-10-CM

## 2019-01-22 DIAGNOSIS — Z20828 Contact with and (suspected) exposure to other viral communicable diseases: Secondary | ICD-10-CM | POA: Diagnosis present

## 2019-01-22 DIAGNOSIS — M952 Other acquired deformity of head: Secondary | ICD-10-CM | POA: Diagnosis present

## 2019-01-22 DIAGNOSIS — Z8249 Family history of ischemic heart disease and other diseases of the circulatory system: Secondary | ICD-10-CM | POA: Diagnosis not present

## 2019-01-22 DIAGNOSIS — I69120 Aphasia following nontraumatic intracerebral hemorrhage: Secondary | ICD-10-CM

## 2019-01-22 DIAGNOSIS — I10 Essential (primary) hypertension: Secondary | ICD-10-CM | POA: Diagnosis present

## 2019-01-22 HISTORY — DX: Unspecified osteoarthritis, unspecified site: M19.90

## 2019-01-22 HISTORY — DX: Cerebral infarction, unspecified: I63.9

## 2019-01-22 HISTORY — DX: Aphasia: R47.01

## 2019-01-22 HISTORY — PX: CRANIOPLASTY: SHX1407

## 2019-01-22 LAB — BASIC METABOLIC PANEL
Anion gap: 11 (ref 5–15)
BUN: 8 mg/dL (ref 6–20)
CO2: 19 mmol/L — ABNORMAL LOW (ref 22–32)
Calcium: 8.9 mg/dL (ref 8.9–10.3)
Chloride: 106 mmol/L (ref 98–111)
Creatinine, Ser: 0.91 mg/dL (ref 0.44–1.00)
GFR calc Af Amer: >60 mL/min (ref >60)
GFR calc non Af Amer: >60 mL/min (ref >60)
Glucose, Bld: 95 mg/dL (ref 70–99)
Potassium: 3.5 mmol/L (ref 3.5–5.1)
Sodium: 136 mmol/L (ref 135–145)

## 2019-01-22 LAB — TYPE AND SCREEN
ABO/RH(D): O POS
Antibody Screen: NEGATIVE

## 2019-01-22 LAB — MRSA PCR SCREENING: MRSA by PCR: POSITIVE — AB

## 2019-01-22 LAB — CBC
HCT: 42.1 % (ref 36.0–46.0)
Hemoglobin: 13.5 g/dL (ref 12.0–15.0)
MCH: 27.3 pg (ref 26.0–34.0)
MCHC: 32.1 g/dL (ref 30.0–36.0)
MCV: 85.1 fL (ref 80.0–100.0)
Platelets: 264 10*3/uL (ref 150–400)
RBC: 4.95 MIL/uL (ref 3.87–5.11)
RDW: 14.5 % (ref 11.5–15.5)
WBC: 7.3 10*3/uL (ref 4.0–10.5)
nRBC: 0 % (ref 0.0–0.2)

## 2019-01-22 LAB — HCG, SERUM, QUALITATIVE: Preg, Serum: NEGATIVE

## 2019-01-22 SURGERY — CRANIOPLASTY
Anesthesia: General | Laterality: Left

## 2019-01-22 MED ORDER — FENTANYL CITRATE (PF) 250 MCG/5ML IJ SOLN
INTRAMUSCULAR | Status: AC
  Filled 2019-01-22: qty 5

## 2019-01-22 MED ORDER — ONDANSETRON HCL 4 MG PO TABS: 4.0000 mg | ORAL_TABLET | ORAL | Status: DC | PRN

## 2019-01-22 MED ORDER — EPHEDRINE SULFATE 50 MG/ML IJ SOLN
INTRAMUSCULAR | Status: DC | PRN
  Administered 2019-01-22 (×6): 5 mg via INTRAVENOUS

## 2019-01-22 MED ORDER — 0.9 % SODIUM CHLORIDE (POUR BTL) OPTIME
TOPICAL | Status: DC | PRN
  Administered 2019-01-22 (×2): 1000 mL

## 2019-01-22 MED ORDER — LEVETIRACETAM 500 MG PO TABS
1000.0000 mg | ORAL_TABLET | Freq: Two times a day (BID) | ORAL | Status: DC
  Administered 2019-01-22 – 2019-01-25 (×6): 1000 mg via ORAL
  Filled 2019-01-22 (×6): qty 2

## 2019-01-22 MED ORDER — SODIUM CHLORIDE 0.9 % IV SOLN
INTRAVENOUS | Status: DC | PRN
  Administered 2019-01-22: 500 mL

## 2019-01-22 MED ORDER — ROCURONIUM BROMIDE 10 MG/ML (PF) SYRINGE
PREFILLED_SYRINGE | INTRAVENOUS | Status: AC
  Filled 2019-01-22: qty 10

## 2019-01-22 MED ORDER — FLEET ENEMA 7-19 GM/118ML RE ENEM: 1.0000 | ENEMA | Freq: Once | RECTAL | Status: DC | PRN

## 2019-01-22 MED ORDER — THROMBIN 5000 UNITS EX SOLR
CUTANEOUS | Status: AC
  Filled 2019-01-22: qty 5000

## 2019-01-22 MED ORDER — ATENOLOL 25 MG PO TABS
50.0000 mg | ORAL_TABLET | Freq: Two times a day (BID) | ORAL | Status: DC
  Administered 2019-01-23 – 2019-01-25 (×5): 50 mg via ORAL
  Filled 2019-01-22: qty 2
  Filled 2019-01-22: qty 1
  Filled 2019-01-22: qty 2
  Filled 2019-01-22: qty 1
  Filled 2019-01-22 (×2): qty 2

## 2019-01-22 MED ORDER — MIDAZOLAM HCL 2 MG/2ML IJ SOLN
INTRAMUSCULAR | Status: DC | PRN
  Administered 2019-01-22: 2 mg via INTRAVENOUS

## 2019-01-22 MED ORDER — LABETALOL HCL 5 MG/ML IV SOLN: 10.0000 mg | INTRAVENOUS | Status: DC | PRN

## 2019-01-22 MED ORDER — SUGAMMADEX SODIUM 200 MG/2ML IV SOLN
INTRAVENOUS | Status: DC | PRN
  Administered 2019-01-22: 190 mg via INTRAVENOUS

## 2019-01-22 MED ORDER — BUPIVACAINE HCL (PF) 0.5 % IJ SOLN
INTRAMUSCULAR | Status: AC
  Filled 2019-01-22: qty 30

## 2019-01-22 MED ORDER — LIDOCAINE-EPINEPHRINE 1 %-1:100000 IJ SOLN
INTRAMUSCULAR | Status: AC
  Filled 2019-01-22: qty 1

## 2019-01-22 MED ORDER — BUPIVACAINE HCL (PF) 0.5 % IJ SOLN
INTRAMUSCULAR | Status: DC | PRN
  Administered 2019-01-22: 3 mL

## 2019-01-22 MED ORDER — ONDANSETRON HCL 4 MG/2ML IJ SOLN
INTRAMUSCULAR | Status: AC
  Filled 2019-01-22: qty 2

## 2019-01-22 MED ORDER — ONDANSETRON HCL 4 MG/2ML IJ SOLN
INTRAMUSCULAR | Status: DC | PRN
  Administered 2019-01-22: 4 mg via INTRAVENOUS

## 2019-01-22 MED ORDER — HYDROMORPHONE HCL 1 MG/ML IJ SOLN
0.5000 mg | INTRAMUSCULAR | Status: DC | PRN
  Administered 2019-01-22 – 2019-01-24 (×11): 1 mg via INTRAVENOUS
  Filled 2019-01-22 (×11): qty 1

## 2019-01-22 MED ORDER — CHLORHEXIDINE GLUCONATE CLOTH 2 % EX PADS: 6.0000 | MEDICATED_PAD | Freq: Once | CUTANEOUS | Status: DC

## 2019-01-22 MED ORDER — ACETAMINOPHEN 325 MG PO TABS
650.0000 mg | ORAL_TABLET | ORAL | Status: DC | PRN
  Administered 2019-01-24: 650 mg via ORAL
  Filled 2019-01-22: qty 2

## 2019-01-22 MED ORDER — MELATONIN 3 MG PO TABS
3.0000 mg | ORAL_TABLET | Freq: Every day | ORAL | Status: DC
  Administered 2019-01-22 – 2019-01-24 (×3): 3 mg via ORAL
  Filled 2019-01-22 (×4): qty 1

## 2019-01-22 MED ORDER — LACTATED RINGERS IV SOLN
INTRAVENOUS | Status: DC | PRN
  Administered 2019-01-22: 08:00:00 via INTRAVENOUS

## 2019-01-22 MED ORDER — LIDOCAINE HCL (CARDIAC) PF 100 MG/5ML IV SOSY
PREFILLED_SYRINGE | INTRAVENOUS | Status: DC | PRN
  Administered 2019-01-22: 60 mg via INTRAVENOUS

## 2019-01-22 MED ORDER — AMLODIPINE BESYLATE 10 MG PO TABS
10.0000 mg | ORAL_TABLET | Freq: Every day | ORAL | Status: DC
  Administered 2019-01-23 – 2019-01-25 (×3): 10 mg via ORAL
  Filled 2019-01-22 (×3): qty 1

## 2019-01-22 MED ORDER — SODIUM CHLORIDE 0.9 % IV SOLN
INTRAVENOUS | Status: DC
  Administered 2019-01-22 – 2019-01-25 (×5): via INTRAVENOUS

## 2019-01-22 MED ORDER — FENTANYL CITRATE (PF) 250 MCG/5ML IJ SOLN
INTRAMUSCULAR | Status: DC | PRN
  Administered 2019-01-22 (×7): 50 ug via INTRAVENOUS

## 2019-01-22 MED ORDER — CEFAZOLIN SODIUM 1 G IJ SOLR
INTRAMUSCULAR | Status: AC
  Filled 2019-01-22: qty 20

## 2019-01-22 MED ORDER — CEFAZOLIN SODIUM-DEXTROSE 2-4 GM/100ML-% IV SOLN
2.0000 g | Freq: Three times a day (TID) | INTRAVENOUS | Status: AC
  Administered 2019-01-22 – 2019-01-23 (×2): 2 g via INTRAVENOUS
  Filled 2019-01-22 (×2): qty 100

## 2019-01-22 MED ORDER — CHLORHEXIDINE GLUCONATE CLOTH 2 % EX PADS
6.0000 | MEDICATED_PAD | Freq: Every day | CUTANEOUS | Status: DC
  Administered 2019-01-23: 6 via TOPICAL

## 2019-01-22 MED ORDER — HYDRALAZINE HCL 20 MG/ML IJ SOLN: 5.0000 mg | INTRAMUSCULAR | Status: DC | PRN

## 2019-01-22 MED ORDER — PROMETHAZINE HCL 12.5 MG PO TABS
12.5000 mg | ORAL_TABLET | ORAL | Status: DC | PRN
  Filled 2019-01-22: qty 2

## 2019-01-22 MED ORDER — NALOXONE HCL 0.4 MG/ML IJ SOLN: 0.0800 mg | INTRAMUSCULAR | Status: DC | PRN

## 2019-01-22 MED ORDER — LACTATED RINGERS IV SOLN
INTRAVENOUS | Status: DC | PRN
  Administered 2019-01-22: 07:00:00 via INTRAVENOUS

## 2019-01-22 MED ORDER — SENNA 8.6 MG PO TABS
1.0000 | ORAL_TABLET | Freq: Two times a day (BID) | ORAL | Status: DC
  Administered 2019-01-22 – 2019-01-24 (×4): 8.6 mg via ORAL
  Filled 2019-01-22 (×6): qty 1

## 2019-01-22 MED ORDER — MUPIROCIN 2 % EX OINT
1.0000 "application " | TOPICAL_OINTMENT | Freq: Two times a day (BID) | CUTANEOUS | Status: DC
  Administered 2019-01-22 – 2019-01-23 (×2): 1 via NASAL
  Filled 2019-01-22 (×2): qty 22

## 2019-01-22 MED ORDER — HYDRALAZINE HCL 20 MG/ML IJ SOLN
INTRAMUSCULAR | Status: DC | PRN
  Administered 2019-01-22: 5 mg via INTRAVENOUS

## 2019-01-22 MED ORDER — HYDROCODONE-ACETAMINOPHEN 5-325 MG PO TABS
1.0000 | ORAL_TABLET | ORAL | Status: DC | PRN
  Administered 2019-01-22 – 2019-01-25 (×14): 1 via ORAL
  Filled 2019-01-22 (×14): qty 1

## 2019-01-22 MED ORDER — PHENYLEPHRINE HCL-NACL 10-0.9 MG/250ML-% IV SOLN
INTRAVENOUS | Status: DC | PRN
  Administered 2019-01-22: 25 ug/min via INTRAVENOUS

## 2019-01-22 MED ORDER — DEXAMETHASONE SODIUM PHOSPHATE 10 MG/ML IJ SOLN
INTRAMUSCULAR | Status: AC
  Filled 2019-01-22: qty 1

## 2019-01-22 MED ORDER — ESMOLOL HCL 100 MG/10ML IV SOLN
INTRAVENOUS | Status: DC | PRN
  Administered 2019-01-22: 20 mg via INTRAVENOUS

## 2019-01-22 MED ORDER — LIDOCAINE-EPINEPHRINE 1 %-1:100000 IJ SOLN
INTRAMUSCULAR | Status: DC | PRN
  Administered 2019-01-22: 3 mL

## 2019-01-22 MED ORDER — LIDOCAINE 2% (20 MG/ML) 5 ML SYRINGE
INTRAMUSCULAR | Status: AC
  Filled 2019-01-22: qty 5

## 2019-01-22 MED ORDER — ONDANSETRON HCL 4 MG/2ML IJ SOLN
4.0000 mg | INTRAMUSCULAR | Status: DC | PRN
  Administered 2019-01-22 – 2019-01-23 (×4): 4 mg via INTRAVENOUS
  Filled 2019-01-22 (×4): qty 2

## 2019-01-22 MED ORDER — MIDAZOLAM HCL 2 MG/2ML IJ SOLN
INTRAMUSCULAR | Status: AC
  Filled 2019-01-22: qty 2

## 2019-01-22 MED ORDER — THROMBIN 5000 UNITS EX SOLR
OROMUCOSAL | Status: DC | PRN
  Administered 2019-01-22: 5 mL via TOPICAL

## 2019-01-22 MED ORDER — BISACODYL 5 MG PO TBEC: 5.0000 mg | DELAYED_RELEASE_TABLET | Freq: Every day | ORAL | Status: DC | PRN

## 2019-01-22 MED ORDER — BACITRACIN ZINC 500 UNIT/GM EX OINT
TOPICAL_OINTMENT | CUTANEOUS | Status: AC
  Filled 2019-01-22: qty 28.35

## 2019-01-22 MED ORDER — EPHEDRINE 5 MG/ML INJ
INTRAVENOUS | Status: AC
  Filled 2019-01-22: qty 10

## 2019-01-22 MED ORDER — FAMOTIDINE 20 MG PO TABS
20.0000 mg | ORAL_TABLET | Freq: Every day | ORAL | Status: DC
  Administered 2019-01-23 – 2019-01-25 (×3): 20 mg via ORAL
  Filled 2019-01-22 (×4): qty 1

## 2019-01-22 MED ORDER — ROCURONIUM BROMIDE 10 MG/ML (PF) SYRINGE
PREFILLED_SYRINGE | INTRAVENOUS | Status: DC | PRN
  Administered 2019-01-22 (×2): 20 mg via INTRAVENOUS
  Administered 2019-01-22: 50 mg via INTRAVENOUS
  Administered 2019-01-22: 10 mg via INTRAVENOUS

## 2019-01-22 MED ORDER — CEFAZOLIN SODIUM-DEXTROSE 2-4 GM/100ML-% IV SOLN
2.0000 g | INTRAVENOUS | Status: AC
  Administered 2019-01-22: 2 g via INTRAVENOUS

## 2019-01-22 MED ORDER — DEXAMETHASONE SODIUM PHOSPHATE 10 MG/ML IJ SOLN
INTRAMUSCULAR | Status: DC | PRN
  Administered 2019-01-22: 5 mg via INTRAVENOUS

## 2019-01-22 MED ORDER — ACETAMINOPHEN 650 MG RE SUPP: 650.0000 mg | RECTAL | Status: DC | PRN

## 2019-01-22 MED ORDER — PROPOFOL 10 MG/ML IV BOLUS
INTRAVENOUS | Status: AC
  Filled 2019-01-22: qty 20

## 2019-01-22 MED ORDER — POLYETHYLENE GLYCOL 3350 17 G PO PACK: 17.0000 g | PACK | Freq: Every day | ORAL | Status: DC | PRN

## 2019-01-22 MED ORDER — PROPOFOL 10 MG/ML IV BOLUS
INTRAVENOUS | Status: DC | PRN
  Administered 2019-01-22: 150 mg via INTRAVENOUS

## 2019-01-22 SURGICAL SUPPLY — 71 items
BATTERY IQ STERILE (MISCELLANEOUS) ×2 IMPLANT
BENZOIN TINCTURE PRP APPL 2/3 (GAUZE/BANDAGES/DRESSINGS) IMPLANT
BLADE CLIPPER SURG (BLADE) ×2 IMPLANT
BNDG GAUZE ELAST 4 BULKY (GAUZE/BANDAGES/DRESSINGS) ×2 IMPLANT
BNDG STRETCH 4X75 NS LF (GAUZE/BANDAGES/DRESSINGS) ×2 IMPLANT
BUR ACORN 6.0 PRECISION (BURR) IMPLANT
BUR MATCHSTICK NEURO 3.0 LAGG (BURR) IMPLANT
BUR SPIRAL ROUTER 2.3 (BUR) IMPLANT
CANISTER SUCT 3000ML PPV (MISCELLANEOUS) ×2 IMPLANT
CARTRIDGE OIL MAESTRO DRILL (MISCELLANEOUS) ×1 IMPLANT
CLIP VESOCCLUDE MED 6/CT (CLIP) IMPLANT
COVER WAND RF STERILE (DRAPES) ×2 IMPLANT
DIFFUSER DRILL AIR PNEUMATIC (MISCELLANEOUS) ×2 IMPLANT
DRAPE NEUROLOGICAL W/INCISE (DRAPES) ×2 IMPLANT
DRAPE SURG 17X23 STRL (DRAPES) IMPLANT
DRAPE WARM FLUID 44X44 (DRAPES) ×2 IMPLANT
DRSG OPSITE POSTOP 4X6 (GAUZE/BANDAGES/DRESSINGS) ×4 IMPLANT
DRSG TELFA 3X8 NADH (GAUZE/BANDAGES/DRESSINGS) ×2 IMPLANT
DURAPREP 6ML APPLICATOR 50/CS (WOUND CARE) ×2 IMPLANT
ELECT REM PT RETURN 9FT ADLT (ELECTROSURGICAL) ×2
ELECTRODE REM PT RTRN 9FT ADLT (ELECTROSURGICAL) ×1 IMPLANT
GAUZE 4X4 16PLY RFD (DISPOSABLE) ×2 IMPLANT
GAUZE SPONGE 4X4 12PLY STRL (GAUZE/BANDAGES/DRESSINGS) ×2 IMPLANT
GLOVE BIO SURGEON STRL SZ7 (GLOVE) IMPLANT
GLOVE BIOGEL PI IND STRL 7.0 (GLOVE) IMPLANT
GLOVE BIOGEL PI IND STRL 7.5 (GLOVE) ×1 IMPLANT
GLOVE BIOGEL PI INDICATOR 7.0 (GLOVE)
GLOVE BIOGEL PI INDICATOR 7.5 (GLOVE) ×1
GLOVE ECLIPSE 7.0 STRL STRAW (GLOVE) ×4 IMPLANT
GLOVE EXAM NITRILE XL STR (GLOVE) IMPLANT
GOWN STRL REUS W/ TWL LRG LVL3 (GOWN DISPOSABLE) ×2 IMPLANT
GOWN STRL REUS W/ TWL XL LVL3 (GOWN DISPOSABLE) IMPLANT
GOWN STRL REUS W/TWL 2XL LVL3 (GOWN DISPOSABLE) IMPLANT
GOWN STRL REUS W/TWL LRG LVL3 (GOWN DISPOSABLE) ×2
GOWN STRL REUS W/TWL XL LVL3 (GOWN DISPOSABLE)
HEMOSTAT POWDER KIT SURGIFOAM (HEMOSTASIS) IMPLANT
HEMOSTAT SURGICEL 2X14 (HEMOSTASIS) IMPLANT
KIT BASIN OR (CUSTOM PROCEDURE TRAY) ×2 IMPLANT
KIT TURNOVER KIT B (KITS) ×2 IMPLANT
NEEDLE HYPO 25X1 1.5 SAFETY (NEEDLE) ×2 IMPLANT
NS IRRIG 1000ML POUR BTL (IV SOLUTION) ×2 IMPLANT
OIL CARTRIDGE MAESTRO DRILL (MISCELLANEOUS) ×2
PACK CRANIOTOMY CUSTOM (CUSTOM PROCEDURE TRAY) ×2 IMPLANT
PATTIES SURGICAL .5 X.5 (GAUZE/BANDAGES/DRESSINGS) IMPLANT
PATTIES SURGICAL .5 X3 (DISPOSABLE) IMPLANT
PATTIES SURGICAL 1X1 (DISPOSABLE) IMPLANT
PLATE 1.5  2HOLE LNG NEURO (Plate) ×4 IMPLANT
PLATE 1.5 2HOLE LNG NEURO (Plate) ×4 IMPLANT
PLATE 1.5 4HOLE LONG STRAIGHT (Plate) ×6 IMPLANT
PLATE 1.5 5HOLE XLONG Y (Plate) ×6 IMPLANT
PLATE 1.5 6HOLE EXT ST L (Plate) ×6 IMPLANT
SCREW SELF DRILL HT 1.5/4MM (Screw) ×44 IMPLANT
SPONGE NEURO XRAY DETECT 1X3 (DISPOSABLE) IMPLANT
SPONGE SURGIFOAM ABS GEL 100 (HEMOSTASIS) ×2 IMPLANT
STAPLER VISISTAT 35W (STAPLE) ×4 IMPLANT
STOCKINETTE 6  STRL (DRAPES) ×1
STOCKINETTE 6 STRL (DRAPES) ×1 IMPLANT
SUT ETHILON 3 0 FSL (SUTURE) IMPLANT
SUT ETHILON 3 0 PS 1 (SUTURE) IMPLANT
SUT NURALON 4 0 TR CR/8 (SUTURE) ×6 IMPLANT
SUT STEEL 0 (SUTURE)
SUT STEEL 0 18XMFL TIE 17 (SUTURE) IMPLANT
SUT VIC AB 0 CT1 18XCR BRD8 (SUTURE) ×4 IMPLANT
SUT VIC AB 0 CT1 8-18 (SUTURE) ×4
SUT VIC AB 3-0 SH 8-18 (SUTURE) ×4 IMPLANT
TOWEL GREEN STERILE (TOWEL DISPOSABLE) ×2 IMPLANT
TOWEL GREEN STERILE FF (TOWEL DISPOSABLE) ×2 IMPLANT
TRAY FOLEY MTR SLVR 16FR STAT (SET/KITS/TRAYS/PACK) ×2 IMPLANT
TUBE CONNECTING 12X1/4 (SUCTIONS) ×2 IMPLANT
UNDERPAD 30X30 (UNDERPADS AND DIAPERS) ×2 IMPLANT
WATER STERILE IRR 1000ML POUR (IV SOLUTION) ×6 IMPLANT

## 2019-01-22 NOTE — Transfer of Care (Signed)
Immediate Anesthesia Transfer of Care Note  Patient: Tammie Miles  Procedure(s) Performed: CRANIOPLASTY (Left )  Patient Location: PACU  Anesthesia Type:General  Level of Consciousness: drowsy and patient cooperative  Airway & Oxygen Therapy: Patient Spontanous Breathing and Patient connected to nasal cannula oxygen  Post-op Assessment: Report given to RN and Post -op Vital signs reviewed and stable  Post vital signs: Reviewed and stable  Last Vitals:  Vitals Value Taken Time  BP    Temp    Pulse    Resp    SpO2      Last Pain:  Vitals:   01/22/19 0705  TempSrc:   PainSc: 5       Patients Stated Pain Goal: 5 (34/19/62 2297)  Complications: No apparent anesthesia complications

## 2019-01-22 NOTE — Anesthesia Procedure Notes (Signed)
Procedure Name: Intubation Date/Time: 01/22/2019 8:01 AM Performed by: Raenette Rover, CRNA Pre-anesthesia Checklist: Patient identified, Emergency Drugs available, Suction available and Patient being monitored Patient Re-evaluated:Patient Re-evaluated prior to induction Oxygen Delivery Method: Circle system utilized Preoxygenation: Pre-oxygenation with 100% oxygen Induction Type: IV induction Ventilation: Mask ventilation without difficulty Laryngoscope Size: Mac and 3 Grade View: Grade I Tube type: Oral Tube size: 7.0 mm Number of attempts: 1 Airway Equipment and Method: Stylet Placement Confirmation: ETT inserted through vocal cords under direct vision,  positive ETCO2 and breath sounds checked- equal and bilateral Secured at: 21 cm Tube secured with: Tape Dental Injury: Teeth and Oropharynx as per pre-operative assessment

## 2019-01-22 NOTE — Anesthesia Postprocedure Evaluation (Signed)
Anesthesia Post Note  Patient: Tammie Miles  Procedure(s) Performed: CRANIOPLASTY (Left )     Patient location during evaluation: PACU Anesthesia Type: General Level of consciousness: awake and alert, oriented and patient cooperative Pain management: pain level controlled Vital Signs Assessment: post-procedure vital signs reviewed and stable Respiratory status: spontaneous breathing, nonlabored ventilation and respiratory function stable Cardiovascular status: blood pressure returned to baseline and stable Postop Assessment: no apparent nausea or vomiting Anesthetic complications: no    Last Vitals:  Vitals:   01/22/19 1127 01/22/19 1142  BP: 120/74 110/68  Pulse: 77 73  Resp: 15 (!) 25  Temp:  36.4 C  SpO2: 90% 90%    Last Pain:  Vitals:   01/22/19 1127  TempSrc:   PainSc: 0-No pain                 Pervis Hocking

## 2019-01-22 NOTE — Brief Op Note (Signed)
  NEUROSURGERY BRIEF OPERATIVE NOTE   PREOP DX: Left cranial defect  POSTOP DX: Same  PROCEDURE: Left cranioplasty  SURGEON: Dr. Consuella Lose, MD  ASSISTANT: [place], PA-C  ANESTHESIA: GETA   EBL: 250cc  SPECIMENS: None  DRAINS: None  COMPLICATIONS: None immediate  CONDITION: Hemodynamically stable to PACU

## 2019-01-23 LAB — CBC
HCT: 31.6 % — ABNORMAL LOW (ref 36.0–46.0)
Hemoglobin: 10.2 g/dL — ABNORMAL LOW (ref 12.0–15.0)
MCH: 27.1 pg (ref 26.0–34.0)
MCHC: 32.3 g/dL (ref 30.0–36.0)
MCV: 83.8 fL (ref 80.0–100.0)
Platelets: 237 10*3/uL (ref 150–400)
RBC: 3.77 MIL/uL — ABNORMAL LOW (ref 3.87–5.11)
RDW: 14.5 % (ref 11.5–15.5)
WBC: 13 10*3/uL — ABNORMAL HIGH (ref 4.0–10.5)
nRBC: 0 % (ref 0.0–0.2)

## 2019-01-23 LAB — BASIC METABOLIC PANEL
Anion gap: 11 (ref 5–15)
BUN: 10 mg/dL (ref 6–20)
CO2: 20 mmol/L — ABNORMAL LOW (ref 22–32)
Calcium: 8.1 mg/dL — ABNORMAL LOW (ref 8.9–10.3)
Chloride: 105 mmol/L (ref 98–111)
Creatinine, Ser: 0.94 mg/dL (ref 0.44–1.00)
GFR calc Af Amer: >60 mL/min (ref >60)
GFR calc non Af Amer: >60 mL/min (ref >60)
Glucose, Bld: 109 mg/dL — ABNORMAL HIGH (ref 70–99)
Potassium: 3.4 mmol/L — ABNORMAL LOW (ref 3.5–5.1)
Sodium: 136 mmol/L (ref 135–145)

## 2019-01-23 NOTE — Progress Notes (Signed)
Updated pt's [place] over the phone. She states pt is resident of [facility]. She states pt is able to stand and pivot to [facility] or chair. Otherwise, pt is wheelchair bound. The expressive aphasia and RUE paralysis have been present since pt's stroke.

## 2019-01-23 NOTE — Progress Notes (Signed)
Subjective: The patient is alert and pleasant.  She is aphasic but attempts to communicate.  Objective: Vital signs in last 24 hours: Temp:  [97.4 F (36.3 C)-98.3 F (36.8 C)] 97.7 F (36.5 C) (11/14 0800) Pulse Rate:  [30-98] 96 (11/14 0600) Resp:  [14-25] 14 (11/14 0300) BP: (100-141)/(66-88) 125/81 (11/14 0600) SpO2:  [82 %-99 %] 92 % (11/14 0600) Estimated body mass index is 37.11 kg/m as calculated from the following:   Height as of this encounter: 5' (1.524 m).   Weight as of this encounter: 86.2 kg.   Intake/Output from previous day: 11/13 0701 - 11/14 0700 In: 1976.5 [I.V.:1876.5; IV Piggyback:100] Out: 800 [Urine:550; Blood:250] Intake/Output this shift: No intake/output data recorded.  Physical exam the patient is alert.  She follows commands on the left.  She is right hemiplegic.  She is aphasic.  Dressing has some blood staining.  Lab Results: Recent Labs    01/22/19 0701 01/23/19 0715  WBC 7.3 13.0*  HGB 13.5 10.2*  HCT 42.1 31.6*  PLT 264 237   BMET Recent Labs    01/22/19 0701 01/23/19 0715  NA 136 136  K 3.5 3.4*  CL 106 105  CO2 19* 20*  GLUCOSE 95 109*  BUN 8 10  CREATININE 0.91 0.94  CALCIUM 8.9 8.1*    Studies/Results: No results found.  Assessment/Plan: Postop day #1: The patient seems to be at her baseline.  I will transfer her to the progressive unit.  LOS: 1 day     Tammie Miles 01/23/2019, 8:46 AM

## 2019-01-23 NOTE — Evaluation (Signed)
Occupational Therapy Evaluation Patient Details Name: Tammie Miles MRN: [id number] DOB: May 30, 1975 Today's Date: 01/23/2019    History of Present Illness this 43 y.o. female admitted for Lt cranioplasty for Lt cranial defect due to prior craniotomy.   PMH includes:  ICH s/p craniotomy, seizures, ADHD, depression, HTN   Clinical Impression   Patient evaluated by Occupational Therapy with no further acute OT needs identified. All education has been completed and the patient has no further questions. Pt appears at, or close to baseline.  She is able to perform ADLs and functional transfers with min guard to mini A.  She is a long term resident of NH and recommend return to SNF/NH.  See below for any follow-up Occupational Therapy or equipment needs. OT is signing off. Thank you for this referral.      Follow Up Recommendations  SNF(return to NH )    Equipment Recommendations  None recommended by OT    Recommendations for Other Services       Precautions / Restrictions Precautions Precautions: Fall Precaution Comments: residual Rt hemiparesis       Mobility Bed Mobility Overal bed mobility: Needs Assistance Bed Mobility: Supine to Sit     Supine to sit: Supervision     General bed mobility comments: supervision for safety   Transfers Overall transfer level: Needs assistance Equipment used: None Transfers: Sit to/from Stand;Stand Pivot Transfers Sit to Stand: Min guard Stand pivot transfers: Min guard       General transfer comment: min guard for safety, however, no LOB noted with stand pivot transfer     Balance Overall balance assessment: Needs assistance Sitting-balance support: Feet supported Sitting balance-Leahy Scale: Fair       Standing balance-Leahy Scale: Fair Standing balance comment: able to maintain static standing with min guard assist                            ADL either performed or assessed with clinical judgement   ADL Overall  ADL's : At baseline                                       General ADL Comments: Pt appears to be at, or close to her baseline level of functioning with ADLs.      Vision   Additional Comments: Per chart review, pt with h/o Rt field deficit vs inattention      Perception     Praxis      Pertinent Vitals/Pain Pain Assessment: Faces Faces Pain Scale: Hurts a little bit Pain Location: head  Pain Descriptors / Indicators: Operative site guarding Pain Intervention(s): Monitored during session;Repositioned     Hand Dominance Left(Rt UE non functional )   Extremity/Trunk Assessment Upper Extremity Assessment Upper Extremity Assessment: RUE deficits/detail RUE Deficits / Details: Pt with Rt hemiparesis with flexor spasticity and flexor contractures.  Fingers with ~40% extension passively - she denies pain, and indicates that her nails do not dig into her palm.  She is able to actively assist with elbow extension to ~-50* RUE Coordination: decreased fine motor;decreased gross motor   Lower Extremity Assessment Lower Extremity Assessment: Defer to PT evaluation       Communication Communication Communication: Expressive difficulties;Receptive difficulties(per chart, she has h/o global aphasia, seems to understand i)   Cognition Arousal/Alertness: Awake/alert Behavior During Therapy: WFL for tasks assessed/performed Overall  Cognitive Status: Difficult to assess                                 General Comments: Pt able to follow one step commands consistently, and is able to use gestures to communicate basic needs.  She has significant expressive aphasia, but seem to understand a lot of what was said to her - has h/o global aphasia    General Comments  VSS     Exercises     Shoulder Instructions      Home Living Family/patient expects to be discharged to:: Skilled nursing facility                                 Additional  Comments: Pt is long term resident of [facility]       Prior Functioning/Environment Level of Independence: Needs assistance  Gait / Transfers Assistance Needed: Per chart review, pt transfers to w/c (unsure level of assist).  Pt reports she performs transfers mod I.    She also indicates she ambulates with cane with assist (info not confirmed) ADL's / Homemaking Assistance Needed: Pt indicates she dresses herself, transfers to toilet mod I, toilets mod I, and has assist with showering             OT Problem List: Decreased strength;Decreased range of motion;Impaired balance (sitting and/or standing);Impaired vision/perception;Decreased coordination;Decreased cognition;Decreased safety awareness;Impaired UE functional use;Impaired tone      OT Treatment/Interventions:      OT Goals(Current goals can be found in the care plan section) Acute Rehab OT Goals Patient Stated Goal: Pt unable to state  OT Goal Formulation: All assessment and education complete, DC therapy  OT Frequency:     Barriers to D/C:            Co-evaluation PT/OT/SLP Co-Evaluation/Treatment: Yes Reason for Co-Treatment: For patient/therapist safety;To address functional/ADL transfers   OT goals addressed during session: ADL's and self-care      AM-PAC OT "6 Clicks" Daily Activity     Outcome Measure Help from another person eating meals?: A Little Help from another person taking care of personal grooming?: A Little Help from another person toileting, which includes using toliet, bedpan, or urinal?: A Little Help from another person bathing (including washing, rinsing, drying)?: A Little Help from another person to put on and taking off regular upper body clothing?: A Little Help from another person to put on and taking off regular lower body clothing?: A Little 6 Click Score: 18   End of Session Nurse Communication: Mobility status  Activity Tolerance: Patient tolerated treatment well Patient left:  in chair;with call bell/phone within reach;with chair alarm set  OT Visit Diagnosis: Hemiplegia and hemiparesis Hemiplegia - Right/Left: Right Hemiplegia - dominant/non-dominant: Dominant Hemiplegia - caused by: Other Nontraumatic intracranial hemorrhage                Time: 1237-1259 OT Time Calculation (min): 22 min Charges:  OT General Charges $OT Visit: 1 Visit OT Evaluation $OT Eval Moderate Complexity: 1 Mod  Lucille Passy, OTR/L Acute Rehabilitation Services Pager [phone] Office [phone]   Lucille Passy M 01/23/2019, 3:09 PM

## 2019-01-23 NOTE — Progress Notes (Signed)
Pt transferred to P side room 13. Belongings including a silver I pad sent with pt. Pts Aunt notified of room change. Pt received by Casey Burkitt.

## 2019-01-23 NOTE — Evaluation (Signed)
Physical Therapy Evaluation and Discharge Patient Details Name: Tammie Miles MRN: [id number] DOB: Apr 25, 1975 Today's Date: 01/23/2019   History of Present Illness  43 y.o. female Approximately 1 year status post left craniotomy for evacuation of a large left frontal hemorrhage, She remains right hemi plegic with global aphasia. Admitted 01/22/19 for left cranioplasty. PMH-seizures, ADHD, anxiety, depression, HTN,   Clinical Impression   Patient evaluated by Physical Therapy with no further acute PT needs identified.  Patient appears to be at baseline. Did not need physical assist to move from supine to sitting in chair. She resides in long-term care and has the needed assist available. PT is signing off. Thank you for this referral.     Follow Up Recommendations No PT follow up    Equipment Recommendations  None recommended by PT    Recommendations for Other Services       Precautions / Restrictions Precautions Precautions: Fall Precaution Comments: residual Rt hemiparesis       Mobility  Bed Mobility Overal bed mobility: Needs Assistance Bed Mobility: Supine to Sit     Supine to sit: Supervision     General bed mobility comments: supervision for safety   Transfers Overall transfer level: Needs assistance Equipment used: None Transfers: Sit to/from Stand;Stand Pivot Transfers Sit to Stand: Min guard Stand pivot transfers: Min guard       General transfer comment: min guard for safety, however, no LOB noted with stand pivot transfer; good safety awareness  Ambulation/Gait                Stairs            Wheelchair Mobility    Modified Rankin (Stroke Patients Only)       Balance Overall balance assessment: Needs assistance Sitting-balance support: Feet supported Sitting balance-Leahy Scale: Fair       Standing balance-Leahy Scale: Fair Standing balance comment: able to maintain static standing with min guard assist                               Pertinent Vitals/Pain Pain Assessment: Faces Faces Pain Scale: Hurts a little bit Pain Location: head  Pain Descriptors / Indicators: Operative site guarding Pain Intervention(s): Limited activity within patient's tolerance;Monitored during session    Home Living Family/patient expects to be discharged to:: Skilled nursing facility                 Additional Comments: Pt is long term resident of [facility]     Prior Function Level of Independence: Needs assistance   Gait / Transfers Assistance Needed: Per chart review, pt transfers to w/c (unsure level of assist).  Pt reports she performs transfers mod I.    She also indicates she ambulates with cane with assist (info not confirmed)  ADL's / Homemaking Assistance Needed: Pt indicates she dresses herself, transfers to toilet mod I, toilets mod I, and has assist with showering         Hand Dominance   Dominant Hand: Left(Rt UE non functional )    Extremity/Trunk Assessment   Upper Extremity Assessment Upper Extremity Assessment: Defer to OT evaluation RUE Deficits / Details: Pt with Rt hemiparesis with flexor spasticity and flexor contractures.  Fingers with ~40% extension passively - she denies pain, and indicates that her nails do not dig into her palm.  She is able to actively assist with elbow extension to ~-50* RUE Coordination: decreased fine  motor;decreased gross motor    Lower Extremity Assessment Lower Extremity Assessment: RLE deficits/detail RLE Deficits / Details: tends to hold RLE in extension/extensor tone; able to bear weight        Communication   Communication: Expressive difficulties;Receptive difficulties(per chart, she has h/o global aphasia, seems to understand i)  Cognition Arousal/Alertness: Awake/alert Behavior During Therapy: WFL for tasks assessed/performed Overall Cognitive Status: Difficult to assess                                 General Comments:  Pt able to follow one step commands consistently, and is able to use gestures to communicate basic needs.  She has significant expressive aphasia, but seem to understand a lot of what was said to her - has h/o global aphasia       General Comments General comments (skin integrity, edema, etc.): VSS on 2L O2    Exercises     Assessment/Plan    PT Assessment Patent does not need any further PT services  PT Problem List         PT Treatment Interventions      PT Goals (Current goals can be found in the Care Plan section)  Acute Rehab PT Goals Patient Stated Goal: Pt unable to state  PT Goal Formulation: All assessment and education complete, DC therapy    Frequency     Barriers to discharge        Co-evaluation PT/OT/SLP Co-Evaluation/Treatment: Yes Reason for Co-Treatment: For patient/therapist safety;Complexity of the patient's impairments (multi-system involvement) PT goals addressed during session: Mobility/safety with mobility;Balance OT goals addressed during session: ADL's and self-care       AM-PAC PT "6 Clicks" Mobility  Outcome Measure Help needed turning from your back to your side while in a flat bed without using bedrails?: None Help needed moving from lying on your back to sitting on the side of a flat bed without using bedrails?: None Help needed moving to and from a bed to a chair (including a wheelchair)?: A Little Help needed standing up from a chair using your arms (e.g., wheelchair or bedside chair)?: A Little Help needed to walk in hospital room?: A Lot Help needed climbing 3-5 steps with a railing? : A Lot 6 Click Score: 18    End of Session Equipment Utilized During Treatment: Oxygen Activity Tolerance: Patient tolerated treatment well Patient left: in chair;with call bell/phone within reach;with chair alarm set Nurse Communication: Mobility status PT Visit Diagnosis: Hemiplegia and hemiparesis Hemiplegia - Right/Left: Right Hemiplegia -  caused by: Other Nontraumatic intracranial hemorrhage    Time: 1237-1259 PT Time Calculation (min) (ACUTE ONLY): 22 min   Charges:   PT Evaluation $PT Eval Low Complexity: 1 Low           Veda Canning, PT Pager [phone]   Zena Amos 01/23/2019, 3:28 PM

## 2019-01-24 NOTE — Progress Notes (Signed)
   Providing Compassionate, Quality Care - Together   Subjective: The patient is alert and present. She is aphasic, but nods appropriately. She reports some pain at her abdominal incision.  Objective: Vital signs in last 24 hours: Temp:  [97.9 F (36.6 C)-98.7 F (37.1 C)] 98.1 F (36.7 C) (11/15 0814) Pulse Rate:  [71-89] 71 (11/15 0814) Resp:  [16-24] 20 (11/15 0814) BP: (110-141)/(65-110) 125/69 (11/15 0814) SpO2:  [92 %-99 %] 96 % (11/15 0814)  Intake/Output from previous day: 11/14 0701 - 11/15 0700 In: 1076.2 [I.V.:1076.2] Out: 1000 [Urine:1000] Intake/Output this shift: Total I/O In: -  Out: 1500 [Urine:1500]  Alert Follows commands Expressive aphasia Right hemiplegia, with contracture at RUE Dressing at head is clean, dry, and intact Abdominal incision with honeycomb dressing. Old serosanguinous drainage marked on dressing  Lab Results: Recent Labs    01/22/19 0701 01/23/19 0715  WBC 7.3 13.0*  HGB 13.5 10.2*  HCT 42.1 31.6*  PLT 264 237   BMET Recent Labs    01/22/19 0701 01/23/19 0715  NA 136 136  K 3.5 3.4*  CL 106 105  CO2 19* 20*  GLUCOSE 95 109*  BUN 8 10  CREATININE 0.91 0.94  CALCIUM 8.9 8.1*    Studies/Results: No results found.  Assessment/Plan: Patient is two days status post left cranioplasty. She appears to be at her baseline. The plan is for her to return to [facility] at discharge.   LOS: 2 days    -Removed honeycomb at abdominal incision. Nurse to put gauze over incision to be changed daily. Staples in place and incision is well-approximated.   Viona Gilmore, DNP, AGNP-C Nurse Practitioner  [facility] [place] [address], [address], [place], [place] [address] P: [phone]    F: [phone]  01/24/2019, 8:57 AM

## 2019-01-25 ENCOUNTER — Encounter (HOSPITAL_COMMUNITY): Payer: Self-pay | Admitting: Neurosurgery

## 2019-01-25 LAB — SARS CORONAVIRUS 2 (TAT 6-24 HRS): SARS Coronavirus 2: NEGATIVE

## 2019-01-25 MED ORDER — ASPIRIN 81 MG PO CHEW: 81.0000 mg | CHEWABLE_TABLET | Freq: Every day | ORAL | Status: DC

## 2019-01-25 NOTE — Progress Notes (Addendum)
   01/25/19 1738  Hand-Off documentation  Handoff Given Given to Transfer Unit/facility  Report given to (Full Name) Ardith Dark [facility]- [phone])  Handoff Received Received from shift RN/LPN  Report received from (Full Name) Ewing Schlein   Received discharge order for patient. Discharge given to PTAR transport. Pt stable in no acute distress. PIV removed, intact. Pt off unit in stretcher. Nursing care complete.

## 2019-01-25 NOTE — Discharge Summary (Signed)
Physician Discharge Summary  Patient ID: KADA FRIESEN MRN: [id number] DOB/AGE: [birth year] 43 y.o.  Admit date: 01/22/2019 Discharge date: 01/25/2019  Admission Diagnoses:  Skull defect  Discharge Diagnoses:  Same Active Problems:   Skull defect   Discharged Condition: Stable  Hospital Course:  Tammie Miles is a 43 y.o. female who was admitted for the below procedure. There were no post operative complications. At time of discharge, pain was well controlled, ambulating with Pt/OT, tolerating po, voiding normal. Ready for discharge.  Treatments: Surgery - cranioplasty  Discharge Exam: Blood pressure 133/90, pulse 83, temperature 98.2 F (36.8 C), temperature source Oral, resp. rate (!) 21, height 5' (1.524 m), weight 86.2 kg, last menstrual period 12/22/2018, SpO2 98 %. Awake, alert Global aphasic Right hemiplegia Incisions: wll approximated  Disposition: Discharge disposition: 70-Another Health Care Institution Not Defined       Discharge Instructions    Call MD for:  difficulty breathing, headache or visual disturbances   Complete by: As directed    Call MD for:  persistant dizziness or light-headedness   Complete by: As directed    Call MD for:  redness, tenderness, or signs of infection (pain, swelling, redness, odor or green/yellow discharge around incision site)   Complete by: As directed    Call MD for:  severe uncontrolled pain   Complete by: As directed    Call MD for:  temperature >100.4   Complete by: As directed    Diet general   Complete by: As directed    Increase activity slowly   Complete by: As directed    May shower / Bathe   Complete by: As directed    In 24 hours. Okay to wash wound with warm soapy water. Avoid scrubbing the wound. Pat dry.   Remove dressing in 24 hours   Complete by: As directed      Allergies as of 01/25/2019   No Known Allergies     Medication List    TAKE these medications   acetaminophen 500 MG  tablet Commonly known as: TYLENOL Take 1,000 mg by mouth 3 (three) times daily.   amLODipine 10 MG tablet Commonly known as: NORVASC Take 1 tablet (10 mg total) by mouth daily.   aspirin 81 MG chewable tablet Place 1 tablet (81 mg total) into feeding tube daily. Start taking on: February 04, 2019 What changed: These instructions start on February 04, 2019. If you are unsure what to do until then, ask your doctor or other care provider.   atenolol 50 MG tablet Commonly known as: TENORMIN Take 50 mg by mouth 2 (two) times daily.   famotidine 20 MG tablet Commonly known as: PEPCID Take 20 mg by mouth daily.   folic acid 1 MG tablet Commonly known as: FOLVITE Place 1 tablet (1 mg total) into feeding tube daily. What changed: how to take this   HYDROcodone-acetaminophen 5-325 MG tablet Commonly known as: NORCO/VICODIN Take 1 tablet by mouth 2 (two) times daily as needed for moderate pain (pain).   levETIRAcetam 1000 MG tablet Commonly known as: KEPPRA Take 1,000 mg by mouth every 12 (twelve) hours.   Melatonin 3 MG Tabs Take 3 mg by mouth at bedtime.   polyethylene glycol 17 g packet Commonly known as: MIRALAX / GLYCOLAX Take 17 g by mouth daily as needed for moderate constipation.   psyllium 95 % Pack Commonly known as: HYDROCIL/METAMUCIL Take 1 packet by mouth 2 (two) times daily. What changed:   when to take  this  reasons to take this   sennosides-docusate sodium 8.6-50 MG tablet Commonly known as: SENOKOT-S Take 2 tablets by mouth daily as needed for constipation.      Follow-up Information    Consuella Lose, MD. Schedule an appointment as soon as possible for a visit in 10 day(s).   Specialty: Neurosurgery Why: staple removal Contact information: [address] [place] [address] [place] [address] [phone]           Signed: Traci Sermon 01/25/2019, 8:35 AM

## 2019-01-25 NOTE — TOC Initial Note (Signed)
Transition of Care [facility]) - Initial/Assessment Note    Patient Details  Name: Tammie Miles MRN: [id number] Date of Birth: 26-Apr-1975  Transition of Care [facility]) CM/SW Contact:    Doy Hutching, LCSWA Phone Number: 01/25/2019, 12:10 PM  Clinical Narrative:                 CSW spoke with pt relative [facility] via telephone.  CSW introduced self, role, reason for visit. Pt from [facility] where she is a LTC resident. Britta Mccreedy is aware that pt stable for discharge and that we are waiting on COVID swab to result before discharge, hopefully this afternoon.   Barbara plans to visit today before discharge. CSW has sent all information to SNF and liaison Chantell aware of delay due to COVID swab.    Expected Discharge Plan: Skilled Nursing Facility Barriers to Discharge: Other (comment)(COVID swab)   Patient Goals and CMS Choice   CMS Medicare.gov Compare Post Acute Care list provided to:: Patient Represenative (must comment)(pt relative [facility]) Choice offered to / list presented to : (pt relative Britta Mccreedy)  Expected Discharge Plan and Services Expected Discharge Plan: Skilled Nursing Facility In-house Referral: Clinical Social Work Discharge Planning Services: CM Consult Post Acute Care Choice: Resumption of Svcs/PTA Provider, Nursing Home Living arrangements for the past 2 months: Skilled Nursing Facility Expected Discharge Date: 01/25/19                Prior Living Arrangements/Services Living arrangements for the past 2 months: Skilled Nursing Facility Lives with:: Facility Resident Patient language and need for interpreter reviewed:: Yes(no needs) Do you feel safe going back to the place where you live?: Yes      Need for Family Participation in Patient Care: Yes (Comment)(ADL/IADLs) Care giver support system in place?: Yes (comment)(pt family; facility resident)   Criminal Activity/Legal Involvement Pertinent to Current Situation/Hospitalization: No - Comment as  needed  Activities of Daily Living Home Assistive Devices/Equipment: Eyeglasses ADL Screening (condition at time of admission) Patient's cognitive ability adequate to safely complete daily activities?: No Is the patient deaf or have difficulty hearing?: No Does the patient have difficulty seeing, even when wearing glasses/contacts?: No Does the patient have difficulty concentrating, remembering, or making decisions?: No Patient able to express need for assistance with ADLs?: Yes Does the patient have difficulty dressing or bathing?: No Independently performs ADLs?: No Communication: Independent Dressing (OT): Independent Grooming: Independent Feeding: Independent Bathing: Needs assistance Is this a change from baseline?: Pre-admission baseline Toileting: Independent In/Out Bed: Independent Walks in Home: Needs assistance Is this a change from baseline?: Pre-admission baseline Does the patient have difficulty walking or climbing stairs?: Yes Weakness of Legs: Right Weakness of Arms/Hands: Right  Permission Sought/Granted Permission sought to share information with : Facility Medical sales representative, Family Supports Permission granted to share information with : Yes, Verbal Permission Granted  Share Information with NAME: Darrol Poke  Permission granted to share info w AGENCY: [facility]  Permission granted to share info w Relationship: relative  Permission granted to share info w Contact Information: [phone]  Emotional Assessment Appearance:: (telephonic assessment completed with relative Britta Mccreedy) Attitude/Demeanor/Rapport: (telephonic assessment completed with relative Britta Mccreedy) Affect (typically observed): (telephonic assessment completed with relative Britta Mccreedy) Orientation: : Oriented to Self, Oriented to Place, Oriented to  Time, Oriented to Situation Alcohol / Substance Use: Not Applicable Psych Involvement: No (comment)  Admission diagnosis:  Skull defect  [M95.2] Patient Active Problem List   Diagnosis Date Noted  . Skull defect 01/22/2019  . Cerebral edema (  [place]) 01/16/2018  . At high risk for injury related to fall 01/16/2018  . Seizures ([place]) 01/16/2018  . Attention deficit hyperactivity disorder (ADHD) 01/16/2018  . Generalized anxiety disorder 01/16/2018  . Depression 01/16/2018  . Tracheostomy in place [facility])   . Pressure injury of skin 12/29/2017  . Altered mental status   . Central venous catheter in place   . 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 Hypertensive emergency 12/02/2017  . Anemia 12/02/2017  . Leukocytosis 12/02/2017  . Acute intra-cranial hemorrhage ([place])   . Respiratory failure ([place])   . ICH (intracerebral hemorrhage) ([place]) 11/27/2017  . Intracranial hemorrhage ([place]) 11/27/2017   PCP:  Raymondo Band, MD Pharmacy:  No Pharmacies Listed    Social Determinants of Health (SDOH) Interventions    Readmission Risk Interventions Readmission Risk Prevention Plan 01/25/2019  Post Dischage Appt Not Complete  Appt Comments pt is SNF resident  Medication Screening Complete  Transportation Screening Complete  Some recent data might be hidden

## 2019-01-25 NOTE — Progress Notes (Signed)
  NEUROSURGERY PROGRESS NOTE   No issues overnight.  ?transportation issues barrier for discharge over the weekend Expressive aphasia, but does nod head appropriate.  Denies concerns  EXAM:  BP 133/90 (BP Location: Right Arm)   Pulse 83   Temp 98.2 F (36.8 C) (Oral)   Resp (!) 21   Ht 5' (1.524 m)   Wt 86.2 kg   LMP 12/22/2018 Comment: November 2020  SpO2 98%   BMI 37.11 kg/m   Awake, alert Aphasic Right hemiplegia abd incision/crani incision: no active bleeding  IMPRESSION/PLAN 43 y.o. female s/p cranioplasty. Stable neurologically. D/C today

## 2019-01-25 NOTE — TOC Transition Note (Signed)
Transition of Care [facility]) - CM/SW Discharge Note   Patient Details  Name: Tammie Miles MRN: [id number] Date of Birth: December 20, 1975  Transition of Care [facility]) CM/SW Contact:  Alexander Mt, [place] Phone Number: 01/25/2019, 3:04 PM   Clinical Narrative:    CSW spoke with Chantell at [facility], pt can return today whenever COVID has resulted. Pt RN can call report and then if pt lab results prior to end of CSW shift then CSW will call PTAR.   If still waiting on COVID screen when CSW shift complete will leave PTAR number for pt RN to call and schedule. Bedside RN Caryl Pina is aware.    Final next level of care: Skilled Nursing Facility Barriers to Discharge: Other (comment)(COVID swab)   Patient Goals and CMS Choice CMS Medicare.gov Compare Post Acute Care list provided to:: Patient Represenative (must comment)(pt relative [facility]) Choice offered to / list presented to : (pt relative [facility])  Discharge Placement  Discharge Plan and Services In-house Referral: Clinical Social Work Discharge Planning Services: CM Consult Post Acute Care Choice: Resumption of Svcs/PTA Provider, Nursing Home           Social Determinants of Health (SDOH) Interventions     Readmission Risk Interventions Readmission Risk Prevention Plan 01/25/2019  Post Dischage Appt Not Complete  Appt Comments pt is SNF resident  Medication Screening Complete  Transportation Screening Complete  Some recent data might be hidden

## 2019-01-25 NOTE — [place] FL2 (Signed)
[place] MEDICAID FL2 LEVEL OF CARE SCREENING TOOL     IDENTIFICATION  Patient Name: Tammie Miles Birthdate: 1976-01-05 Sex: female Admission Date (Current Location): 01/22/2019  [place] Number:  Tammie Miles [id number] Facility and Address:  [facility] [place], [address], [place], [place] [address]      Provider Number: [id number]  Attending Physician Name and Address:  Tammie Renshaw, MD  Relative Name and Phone Number:       Current Level of Care: Hospital Recommended Level of Care: Skilled Nursing Facility Prior Approval Number:    Date Approved/Denied:   PASRR Number: [id number]  Discharge Plan: SNF    Current Diagnoses: Patient Active Problem List   Diagnosis Date Noted  . Skull defect 01/22/2019  . Cerebral edema (HCC) 01/16/2018  . At high risk for injury related to fall 01/16/2018  . Seizures (HCC) 01/16/2018  . Attention deficit hyperactivity disorder (ADHD) 01/16/2018  . Generalized anxiety disorder 01/16/2018  . Depression 01/16/2018  . Tracheostomy in place [facility])   . Pressure injury of skin 12/29/2017  . Altered mental status   . Central venous catheter in place   . 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 Hypertensive emergency 12/02/2017  . Anemia 12/02/2017  . Leukocytosis 12/02/2017  . Acute intra-cranial hemorrhage (HCC)   . Respiratory failure (HCC)   . ICH (intracerebral hemorrhage) (HCC) 11/27/2017  . Intracranial hemorrhage (HCC) 11/27/2017    Orientation RESPIRATION BLADDER Height & Weight     Time, Situation, Place, Self  O2(2L nasal canula) Continent, External catheter Weight: 190 lb (86.2 kg) Height:  5' (152.4 cm)  BEHAVIORAL SYMPTOMS/MOOD NEUROLOGICAL BOWEL NUTRITION STATUS      Continent Diet(see discharge summary)  AMBULATORY STATUS COMMUNICATION OF NEEDS Skin   Extensive Assist  Verbally Surgical wounds, Other (Comment)(closed incision on left side head with gauze; closed incision on abdomen with gauze BID; dry skin)                       Personal Care Assistance Level of Assistance  Dressing, Feeding, Bathing Bathing Assistance: Maximum assistance Feeding assistance: Limited assistance Dressing Assistance: Maximum assistance     Functional Limitations Info  Sight, Speech, Hearing Sight Info: Impaired Hearing Info: Adequate Speech Info: Impaired(aphasia)    SPECIAL CARE FACTORS FREQUENCY  OT (By licensed OT), PT (By licensed PT)     PT Frequency: 5x week OT Frequency: 5x week            Contractures Contractures Info: Not present    Additional Factors Info  Code Status, Allergies Code Status Info: Full Code Allergies Info: No Known Allergies           Current Medications (01/25/2019):  This is the current hospital active medication list Current Facility-Administered Medications  Medication Dose Route Frequency Provider Last Rate Last Dose  . 0.9 %  sodium chloride infusion   Intravenous Continuous Tressie Stalker, MD 75 mL/hr at 01/25/19 0820    . acetaminophen (TYLENOL) tablet 650 mg  650 mg Oral Q4H PRN Tressie Stalker, MD   650 mg at 01/24/19 1436   Or  . acetaminophen (TYLENOL) suppository 650 mg  650 mg Rectal Q4H PRN Tressie Stalker, MD      . amLODipine (NORVASC) tablet 10 mg  10 mg Oral Daily Tressie Stalker, MD   10 mg at 01/25/19 6269  . atenolol (TENORMIN)  tablet 50 mg  50 mg Oral BID Newman Pies, MD   50 mg at 01/25/19 7371  . bisacodyl (DULCOLAX) EC tablet 5 mg  5 mg Oral Daily PRN Newman Pies, MD      . famotidine (PEPCID) tablet 20 mg  20 mg Oral Daily Newman Pies, MD   20 mg at 01/25/19 0626  . hydrALAZINE (APRESOLINE) injection 5-20 mg  5-20 mg Intravenous Q4H PRN Newman Pies, MD      . HYDROcodone-acetaminophen (NORCO/VICODIN) 5-325 MG per tablet 1 tablet  1 tablet Oral Q4H PRN Newman Pies,  MD   1 tablet at 01/25/19 [phone]  . HYDROmorphone (DILAUDID) injection 0.5-1 mg  0.5-1 mg Intravenous Q2H PRN Newman Pies, MD   1 mg at 01/24/19 0815  . labetalol (NORMODYNE) injection 10-40 mg  10-40 mg Intravenous Q10 min PRN Newman Pies, MD      . levETIRAcetam (KEPPRA) tablet 1,000 mg  1,000 mg Oral Q12H Newman Pies, MD   1,000 mg at 01/25/19 4627  . Melatonin TABS 3 mg  3 mg Oral QHS Newman Pies, MD   3 mg at 01/24/19 2143  . naloxone [facility]) injection 0.08 mg  0.08 mg Intravenous PRN Newman Pies, MD      . ondansetron [facility]) tablet 4 mg  4 mg Oral Q4H PRN Newman Pies, MD       Or  . ondansetron [facility]) injection 4 mg  4 mg Intravenous Q4H PRN Newman Pies, MD   4 mg at 01/23/19 1019  . polyethylene glycol (MIRALAX / GLYCOLAX) packet 17 g  17 g Oral Daily PRN Newman Pies, MD      . promethazine (PHENERGAN) tablet 12.5-25 mg  12.5-25 mg Oral Q4H PRN Newman Pies, MD      . [facility]) tablet 8.6 mg  1 tablet Oral BID Newman Pies, MD   8.6 mg at 01/24/19 2137  . sodium phosphate (FLEET) 7-19 GM/118ML enema 1 enema  1 enema Rectal Once PRN Newman Pies, MD         Discharge Medications: Please see discharge summary for a list of discharge medications.  Relevant Imaging Results:  Relevant Lab Results:   Additional Information [id number] [place], [place]

## 2019-01-25 NOTE — Social Work (Addendum)
Clinical Social Worker facilitated patient discharge including contacting patient family and facility to confirm patient discharge plans.  Clinical information faxed to facility and family agreeable with plan.  CSW arranged ambulance transport via [place] (await COVID screen) to [facility].  RN to call [phone] with report prior to discharge.  PTAR # is [phone] After [facility] [address] # is [phone] then press 1 and then press 3.   Clinical Social Worker will sign off for now as social work intervention is no longer needed. Please consult [place] again if new need arises.  Westley Hummer, MSW, [place] Social Worker [phone]

## 2019-01-26 NOTE — Op Note (Signed)
  NEUROSURGERY OPERATIVE NOTE   PREOP DIAGNOSIS:  1. Left cranial defect   POSTOP DIAGNOSIS: Same  PROCEDURE: 1. Left cranioplasty 2.  harvest of abdominal bone flap  SURGEON: Dr. Consuella Lose, MD  ASSISTANT:  Ferne Reus, PA-C  ANESTHESIA: General Endotracheal  EBL: 250cc  SPECIMENS: None  DRAINS: None  COMPLICATIONS: None immediate  CONDITION: Hemodynamically stable to PACU  HISTORY: Tammie Miles is a 43 y.o. female who initially went who initially underwent craniectomy approximately 1 year ago for underwent craniectomy approximately 1 year ago for hemorrhagic stroke. Hemorrhagic stroke.  Bone flap was implanted in the abdomen.  She has since made an excellent with continued aphasia and right hemiparesis.she was seen in the outpatient neurosurgery clinic with the craniectomy defect was noted to be sunken.  She was therefore ready for cranioplasty.  The risks and benefits of the surgery were reviewed in detail with both the patient and her next of kin.  After all questions were answered informed consent was obtained and witnessed.  PROCEDURE IN DETAIL: The patient was brought to the operating room. After induction of general anesthesia, the patient was positioned on the operative table in the supine position. All pressure points were meticulously padded.  Previous scalp and abdominal skin incision was then marked out and prepped and draped in the usual sterile fashion.  After timeout was conducted, the abdominal incision was infiltrated with local anesthetic with epinephrine.  The abdominal incision was then opened and the bone flap was removed from the subcutaneous space.  The bone flap was noted to be in 2 different pieces which had partially fused together on top of each other.  We were able to separate the 2 pieces.  The scalp incision was then opened sharply and carried through the galea to identify the edge of the craniectomy defect.  An epidural plane was then  developed utilizing the dural substitute which was placed at the time of craniectomy.  Edges of the bone flap were dissected free.  The bone flap was then oriented correctly and the 2 pieces were plated together with standard titanium plates and screws.  The craniectomy flap was then reinserted and secured to the remainder of the skull using titanium plates and screws.  The wound was then irrigated with copious amounts of normal saline irrigation.  The temporalis muscle was then reapproximated with interrupted 0 Vicryl stitches.  The galea was closed with interrupted 0 Vicryl stitches.  The skin was closed with skin staples.  Abdominal incision was similarly closed in layers with interrupted 0 and 3-0 Vicryl stitches.  Skin incisions were then closed with staples.  Bacitracin ointment and sterile dressings were applied.  The patient was then transferred to the stretcher, extubated, and taken to the postanesthesia care unit in stable hemodynamic condition.

## 2019-02-24 ENCOUNTER — Ambulatory Visit: Payer: Medicaid Other | Admitting: Adult Health

## 2019-02-24 ENCOUNTER — Telehealth: Payer: Self-pay

## 2019-02-24 NOTE — Telephone Encounter (Signed)
Patient was a no call/no show for their appointment today.   

## 2019-02-24 NOTE — Progress Notes (Deleted)
[facility] [address] [place] [address] [phone]       OFFICE FOLLOW UP NOTE  [name] Date of Birth:  09-Jun-1975 Medical Record Number:  [id number]   Reason for visit: Left MCA infarct with hemorrhagic transformation  CHIEF COMPLAINT:  No chief complaint on file.   HPI:  Stroke admission: Tammie Miles is a 42 y.o. Femalewith PMH of HTN on hypertensive medications, antidepressants, stimulants, and Xanax which are all prescribed by her primary physician.  Per discharge summary notes by Dr. Leonie Man, after awakening from a nap on 11/26/2017 she was noted to not be acting correctly and was dropping things.  Her 40-year-old daughter called the father multiple times and when he went and checked on her he believes that she was drunk and she was obtunded but moving her arms.  The following morning on 11/27/2017, she remained obtunded and the husband called 911 where she was brought into the emergency room via EMS.  Initial CT head reviewed and showed large area of hemorrhagic left basal ganglia and left frontal lobe was noted with a 5.8 x 3.5 cm along with 15 mm shift from the left to right with early herniation.  Neurosurgery was immediately called for decompression.  Initial hematoma evacuation performed and after neuro worsening, hemicraniectomy was performed a few days later.  Initial insult felt to be ischemic with large hemorrhagic transformation and continued bleeding.  Unfortunately, patient had a prolonged hospitalization due to lack of family support and insurance for rehab.  Patient was started on Keppra for seizure prevention after episode of stiffness and tongue biting on 12/03/2017.  EEG did not show seizure activity.  Due to acute respiratory insufficiency, patient was intubated and did undergo tracheostomy on 12/09/2017.  Blood pressure initially found to be elevated on admission but stabilized throughout hospitalization.  Due to continued dysphasia,  PEG tube placed on 12/09/2017 for nutritional support.  Aspirin 81 mg initiated for secondary stroke prevention.  Therapies recommended SNF but due to lack of bed placement, patient was transferred to CIR for ongoing PT/OT/ST.  During CIR stay, patient was decannulated on 01/09/2018.  Due to continued therapy needs, patient was discharged to SNF [facility] and rehabilitation center on 01/20/2018.  Initial visit 03/05/2018: Patient is being seen today for hospital follow-up and is accompanied by her aunt.  She continues to reside at [facility] and rehabilitation center.  She continues to receive PT/OT/ST. she continues to have LUE flaccid and expressive > receptive aphasia.  Severe expressive aphasia present with only being able to say "no" but is able to nod her head appropriately.  Mild to moderate receptive aphasia present.  She has had PEG tube removed and is currently obtaining all nutrition and pills orally.  She denies any complication in doing so and denies any aspiration or coughing with eating.  She is able to ambulate without assistance and denies any recent falls.  She needs minimal assistance with ADLs such as bathing and dressing.  She does feel as though she has been improving.  Continues on aspirin 81 mg without side effects of bleeding or bruising.  She continues on Keppra without recurrent seizures and has been tolerating well.  Blood pressure today satisfactory 135/82.  She denies follow-up appointment with neurosurgery since hospital discharge as recommended.  She does have skullcap placed right side of abdomen.  No further concerns at this time.  Denies new or worsening stroke/TIA symptoms.  Update 11/26/2018: Tammie Miles is  being seen today for stroke follow-up accompanied by her aunt.  Prior follow-up appointments canceled/rescheduled due to COVID-19 safety restrictions and difficulty with scheduling virtual visit.  She continues to reside at [facility].  Residual  stroke deficits of right hemiparesis and severe expressive > mild to moderate receptive aphasia.  She is ambulatory for very short distances and assistance needed.  Aunt states she was doing well with improvement of ambulation but this worsened after seizure activity in 05/2018 and lack of therapy.  She was admitted to ED on 05/20/2018 due to seizure activity but denies additional seizure since that time.  She continues on Keppra without side effects.  Referral placed at prior visit to follow-up with neurosurgery but unfortunately due to difficulty getting a hold of patient/facility, appointment was never made. Skullflap located right abdomen.  She continues on aspirin 81 mg daily for secondary stroke prevention without side effects.  Blood pressure today satisfactory at 135/89.  Denies new or worsening stroke/TIA symptoms.  Update 02/24/2019: Tammie Miles is a 43 year old female who is being seen today for stroke follow-up.  Residual deficits were hemiparesis and expressive > receptive aphasia.  She did undergo left cranioplasty on 01/22/2019 with Dr. Conchita ParisNundkumar without complication.  She continues on Keppra without reoccurring seizure activity and tolerating without side effects.  She continues on aspirin 81 mg daily without bleeding or bruising.  Blood pressures today ***.  Denies new or worsening stroke/TIA symptoms.     ROS:   14 system review of systems performed and negative with exception of speech difficulty, weakness  PMH:  Past Medical History:  Diagnosis Date  . ADHD   . Anxiety   . Aphasia   . Arthritis   . Depression   . Hypertension   . Seizures (HCC)   . Stroke [facility](HCC)     PSH:  Past Surgical History:  Procedure Laterality Date  . CRANIOPLASTY Left 01/22/2019   Procedure: CRANIOPLASTY;  Surgeon: Lisbeth RenshawNundkumar, Neelesh, MD;  [facility];  Service: Neurosurgery;  Laterality: Left;  CRANIOPLASTY  . CRANIOTOMY Left 11/27/2017   Procedure: CRANIOTOMY for Evacuation of Intracranial  Hemorrhage;  Surgeon: Lisbeth RenshawNundkumar, Neelesh, MD;  [facility];  Service: Neurosurgery;  Laterality: Left;  CRANIOTOMY for Evacuation of Intracranial Hemorrhage  . CRANIOTOMY Left 11/28/2017   Procedure: Decompressive craniectomy;  Surgeon: Maeola HarmanStern, Joseph, MD;  [facility];  Service: Neurosurgery;  Laterality: Left;  . ESOPHAGOGASTRODUODENOSCOPY N/A 12/09/2017   Procedure: ESOPHAGOGASTRODUODENOSCOPY (EGD);  Surgeon: Violeta Gelinashompson, Burke, MD;  [facility];  Service: General;  Laterality: N/A;  . KNEE SURGERY Bilateral   . PEG PLACEMENT N/A 12/09/2017   Procedure: PERCUTANEOUS ENDOSCOPIC GASTROSTOMY (PEG) PLACEMENT;  Surgeon: Violeta Gelinashompson, Burke, MD;  [facility];  Service: General;  Laterality: N/A;    Social History:  Social History   Socioeconomic History  . Marital status: Married    Spouse name: Not on file  . Number of children: 1  . Years of education: Not on file  . Highest education level: Not on file  Occupational History  . Not on file  Tobacco Use  . Smoking status: Never Smoker  . Smokeless tobacco: Never Used  Substance and Sexual Activity  . Alcohol use: Not Currently  . Drug use: Not Currently  . Sexual activity: Yes  Other Topics Concern  . Not on file  Social History Narrative  . Not on file   Social Determinants of Health   Financial Resource Strain:   . Difficulty of Paying Living Expenses:  Not on file  Food Insecurity:   . Worried About Programme researcher, broadcasting/film/video in the Last Year: Not on file  . Ran Out of Food in the Last Year: Not on file  Transportation Needs:   . Lack of Transportation (Medical): Not on file  . Lack of Transportation (Non-Medical): Not on file  Physical Activity:   . Days of Exercise per Week: Not on file  . Minutes of Exercise per Session: Not on file  Stress:   . Feeling of Stress : Not on file  Social Connections:   . Frequency of Communication with Friends and Family: Not on file  . Frequency of Social Gatherings with  Friends and Family: Not on file  . Attends Religious Services: Not on file  . Active Member of Clubs or Organizations: Not on file  . Attends Banker Meetings: Not on file  . Marital Status: Not on file  Intimate Partner Violence:   . Fear of Current or Ex-Partner: Not on file  . Emotionally Abused: Not on file  . Physically Abused: Not on file  . Sexually Abused: Not on file    Family History:  Family History  Problem Relation Age of Onset  . Hypertension Mother   . Hypertension Father     Medications:   Current Outpatient Medications on File Prior to Visit  Medication Sig Dispense Refill  . acetaminophen (TYLENOL) 500 MG tablet Take 1,000 mg by mouth 3 (three) times daily.    Marland Kitchen amLODipine (NORVASC) 10 MG tablet Take 1 tablet (10 mg total) by mouth daily.    Marland Kitchen aspirin 81 MG chewable tablet Place 1 tablet (81 mg total) into feeding tube daily.    Marland Kitchen atenolol (TENORMIN) 50 MG tablet Take 50 mg by mouth 2 (two) times daily.    . famotidine (PEPCID) 20 MG tablet Take 20 mg by mouth daily.     . folic acid (FOLVITE) 1 MG tablet Place 1 tablet (1 mg total) into feeding tube daily. (Patient taking differently: Take 1 mg by mouth daily. )    . HYDROcodone-acetaminophen (NORCO/VICODIN) 5-325 MG tablet Take 1 tablet by mouth 2 (two) times daily as needed for moderate pain (pain).    Marland Kitchen levETIRAcetam (KEPPRA) 1000 MG tablet Take 1,000 mg by mouth every 12 (twelve) hours.    . Melatonin 3 MG TABS Take 3 mg by mouth at bedtime.    . polyethylene glycol (MIRALAX / GLYCOLAX) 17 g packet Take 17 g by mouth daily as needed for moderate constipation.    . psyllium (HYDROCIL/METAMUCIL) 95 % PACK Take 1 packet by mouth 2 (two) times daily. (Patient taking differently: Take 1 packet by mouth 2 (two) times daily as needed for moderate constipation. ) 240 each   . sennosides-docusate sodium (SENOKOT-S) 8.6-50 MG tablet Take 2 tablets by mouth daily as needed for constipation.     No current  facility-administered medications on file prior to visit.    Allergies:  No Known Allergies   Physical Exam  There were no vitals filed for this visit. There is no height or weight on file to calculate BMI. No exam data present  General: well developed, well nourished, pleasant African-American female, seated, in no evident distress Head: head normocephalic and atraumatic.   Neck: supple with no carotid or supraclavicular bruits Cardiovascular: regular rate and rhythm, no murmurs Musculoskeletal: no deformity Skin:  no rash/petichiae; skull flap present RLQ with healed incision; healed incision at post PEG site and post  trach site Vascular:  Normal pulses all extremities  Neurologic Exam Mental Status: Awake and fully alert.  Severe expressive aphasia with mild to moderate receptive aphasia.  No evidence of cognitive difficulties.  Mood and affect appropriate and cooperative with exam Cranial Nerves: Pupils equal, briskly reactive to light. Extraocular movements full without nystagmus. Visual fields full unable to assess as patient unable to follow commands appropriately. Hearing intact. Facial sensation intact.  Right lower facial paralysis Motor:  RUE: 1/5 with spasticity; RLE: 5/5 with weak ankle dorsiflexion and spasticity. full strength left upper and lower extremity Sensory.: intact to touch , pinprick , position and vibratory sensation.  Coordination: Rapid alternating movements normal in left upper extremity. Finger-to-nose performed accurately in left upper extremity and heel-to-shin performed accurately . Gait and Station: Arises from chair without difficulty. Stance is normal. Gait demonstrates hemiplegic gait and able to take a couple steps without assistance Reflexes: 2+ right side and 1+ right side. Toes downgoing.      Diagnostic Data (Labs, Imaging, Testing)  CT HEAD WO CONTRAST 11/27/2017 IMPRESSION: Large area of hemorrhage in the left basal ganglia and left  frontal lobe, 5.8 x 3.5 cm. 15 mm of left-to-right midline shift.  CT ANGIO HEAD W OR WO CONTRAST 11/27/2017 IMPRESSION: 1. Positive for left MCA M1 large vessel occlusion, and also severe stenosis at the left ICA terminus, superimposed on the relatively large acute left hemisphere intra-axial hemorrhage (estimated blood volume 43 mL). Vasospasm suspected in the proximal ACAs. 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 2. Negative for intracranial aneurysm, CTA spot sign, or evidence of vascular malformation in association with the acute hemorrhage. 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 3. Stable intracranial mass effect since 0938 hours today, with 16 mm of leftward midline shift and trapping of the right lateral ventricle.  CT HEAD WO CONTRAST 11/27/2017 IMPRESSION: 1. Interval LEFT frontal craniotomy for basal ganglia hematoma evacuation, small amount of residual blood products and regional edema. 7 mm residual LEFT to RIGHT midline shift, resolved RIGHT ventricular entrapment. 2. Trace RIGHT holo hemispheric and tentorial subdural hematomas. Global edema.  MR BRAIN WO CONTRAST 11/27/2017 IMPRESSION: 1. Postoperative changes from previous left frontal craniotomy for evacuation of left basal ganglia hematoma. Residual blood products within the evacuation cavity with persistent surrounding vasogenic edema and regional mass effect. Associated 8-9 mm of right-to-left midline shift with mild left uncal herniation similar to previous. No hydrocephalus or ventricular trapping. 2. Underlying evolving large acute ischemic left MCA territory infarct involving the majority of the left MCA territory. 3. Trace right cerebral and tentorial subdural hematoma without mass effect. 4. Right  sphenoid sinusitis.  CT HEAD WO CONTRAST 11/28/2017 IMPRESSION: 1. Stable when compared to yesterday. 2. Left MCA territory infarct with evacuated hematoma. Midline shift measures 8 mm. 3. 2-3 mm subdural hematoma along the right cerebral convexity without interval increase.  CT HEAD WO CONTRAST 11/29/2017 IMPRESSION: 1. Large left MCA infarct is stable in distribution. 2. Interval left frontal craniectomy with mild herniation of left frontal lobe through the defect. 3. Resolved uncal herniation, improved patency of left lateral ventricle, and 6 mm left-to-right midline shift, previously 8 mm. 4. Partial interval dispersion of acute hemorrhage in the left basal ganglia. Subcentimeter density anterior to the prior position of blood products possibly representing retracted clot or interval hemorrhage, attention at follow-up recommended.  CT HEAD WO CONTRAST 12/02/2017 IMPRESSION: 1. Left MCA distribution infarction is stable in distribution. Mild increased edema of the infarct with increased herniation of the left frontal lobe via the left frontal craniectomy. 2. Stable 6 mm left-to-right midline shift and partial effacement of left lateral ventricle. 3. Stable small hematoma within the left basal ganglia. 4. No new acute intracranial abnormality identified.    ASSESSMENT: Tammie Miles is a 43 y.o. 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  Initial insult felt to be ischemic with large hemorrhagic transformation and continued bleeding.  Hospitalization complicated by acute respiratory insufficiency status post trach with decannulation, seizure, hypertensive emergency, anemia, and dysphasia status post PEG. Vascular risk factors include HTN and depression.  Residual  deficits of severe expressive aphasia with mild to moderate receptive aphasia and right spastic hemiparesis    PLAN:  1. Left MCA infarct with hemorrhagic transformation status post hemicraniotomy: Continue aspirin 81 mg daily for secondary stroke prevention. Maintain strict control of hypertension with blood pressure goal below 130/90, diabetes with hemoglobin A1c goal below 6.5% and cholesterol with LDL cholesterol (bad cholesterol) goal below 70 mg/dL.  I also advised the patient to eat a healthy diet with plenty of whole grains, cereals, fruits and vegetables, exercise regularly with at least 30 minutes of continuous activity daily and maintain ideal body weight.  Advised patient to continue PT/OT/ST for continued deficits 2. Right spastic hemiparesis/global aphasia: Highly recommend PT and SLP for potential improvement of deficits.  Discussion with patient and aunt regarding possible minimal improvement as it has now been 1 year since her stroke but this can be further evaluated by therapy 3. S/p hemicraniotomy: Provided aunt and facility with [place] [facility] contact information to schedule follow-up visit 4. HTN: Advised to continue current treatment regimen.  Today's BP 135/82.  Advised to continue to monitor at home along with continued follow-up with PCP for management 5. Seizure: Continue Keppra 1 g twice daily for seizure prophylaxis   Follow up in 3 months or call earlier if needed   Greater than 50% of time during this 25 minute visit was spent on counseling, explanation of diagnosis of left MCA infarct with hemorrhagic transformation status post hemicraniectomy, reviewing risk factor management of HTN and seizures, planning of further management along with potential future management, and discussion with patient and family answering all questions.    George Hugh, AGNP-BC  [facility] [address] [place], [place] [address]  Phone  [phone] Fax [phone] Note: This document was prepared with digital dictation and possible smart phrase technology. Any transcriptional errors that result from this process are unintentional.

## 2019-03-01 ENCOUNTER — Encounter: Payer: Self-pay | Admitting: Adult Health

## 2019-06-07 ENCOUNTER — Other Ambulatory Visit: Payer: Self-pay

## 2019-06-07 ENCOUNTER — Emergency Department (HOSPITAL_COMMUNITY)
Admission: EM | Admit: 2019-06-07 | Discharge: 2019-06-07 | Disposition: A | Discharge status: Home / Self Care | Payer: Medicaid Other | Attending: Emergency Medicine | Admitting: Emergency Medicine

## 2019-06-07 ENCOUNTER — Emergency Department (HOSPITAL_COMMUNITY): Payer: Medicaid Other

## 2019-06-07 DIAGNOSIS — I10 Essential (primary) hypertension: Secondary | ICD-10-CM | POA: Diagnosis not present

## 2019-06-07 DIAGNOSIS — M542 Cervicalgia: Secondary | ICD-10-CM | POA: Diagnosis not present

## 2019-06-07 DIAGNOSIS — Z79899 Other long term (current) drug therapy: Secondary | ICD-10-CM | POA: Insufficient documentation

## 2019-06-07 DIAGNOSIS — F909 Attention-deficit hyperactivity disorder, unspecified type: Secondary | ICD-10-CM | POA: Insufficient documentation

## 2019-06-07 DIAGNOSIS — Z8669 Personal history of other diseases of the nervous system and sense organs: Secondary | ICD-10-CM | POA: Insufficient documentation

## 2019-06-07 MED ORDER — HYDROCODONE-ACETAMINOPHEN 5-325 MG PO TABS: 1.0000 | ORAL_TABLET | ORAL | 0 refills | Status: DC | PRN

## 2019-06-07 MED ORDER — FENTANYL CITRATE (PF) 100 MCG/2ML IJ SOLN
50.0000 ug | Freq: Once | INTRAMUSCULAR | Status: AC
  Administered 2019-06-07: 50 ug via INTRAMUSCULAR
  Filled 2019-06-07: qty 2

## 2019-06-07 NOTE — ED Provider Notes (Signed)
[facility] EMERGENCY DEPARTMENT Provider Note   CSN: [id number] Arrival date & time: 06/07/19  1426     History Chief Complaint  Patient presents with  . Neck Pain    Tammie Miles is a 44 y.o. female.  The history is provided by the patient. No language interpreter was used.  Neck Pain Pain location:  Generalized neck Quality:  Aching Pain radiates to:  Does not radiate Pain severity:  Moderate Pain is:  Same all the time Timing:  Constant Progression:  Worsening Relieved by:  Nothing Worsened by:  Nothing Pt complains of pain in her neck at trach scar.  Pt had a craniotomy second to a cerebral hemorrhage.  Pt has limit communication skill but she is able to answer yes and no and points to her neck      Past Medical History:  Diagnosis Date  . ADHD   . Anxiety   . Aphasia   . Arthritis   . Depression   . Hypertension   . Seizures ([place])   . Stroke [facility])     Patient Active Problem List   Diagnosis Date Noted  . Skull defect 01/22/2019  . Cerebral edema ([place]) 01/16/2018  . At high risk for injury related to fall 01/16/2018  . Seizures ([place]) 01/16/2018  . Attention deficit hyperactivity disorder (ADHD) 01/16/2018  . Generalized anxiety disorder 01/16/2018  . Depression 01/16/2018  . Tracheostomy in place [facility])   . Pressure injury of skin 12/29/2017  . Altered mental status   . Central venous catheter in place   . 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 Hypertensive emergency 12/02/2017  . Anemia 12/02/2017  . Leukocytosis 12/02/2017  . Acute intra-cranial hemorrhage ([place])   . Respiratory failure ([place])   . ICH (intracerebral hemorrhage) ([place]) 11/27/2017  . Intracranial hemorrhage ([place]) 11/27/2017    Past Surgical History:  Procedure Laterality Date  . CRANIOPLASTY Left 01/22/2019   Procedure: CRANIOPLASTY;  Surgeon:  Consuella Lose, MD;  Location: [place];  Service: Neurosurgery;  Laterality: Left;  CRANIOPLASTY  . CRANIOTOMY Left 11/27/2017   Procedure: CRANIOTOMY for Evacuation of Intracranial Hemorrhage;  Surgeon: Consuella Lose, MD;  Location: [place];  Service: Neurosurgery;  Laterality: Left;  CRANIOTOMY for Evacuation of Intracranial Hemorrhage  . CRANIOTOMY Left 11/28/2017   Procedure: Decompressive craniectomy;  Surgeon: Erline Levine, MD;  Location: [place];  Service: Neurosurgery;  Laterality: Left;  . ESOPHAGOGASTRODUODENOSCOPY N/A 12/09/2017   Procedure: ESOPHAGOGASTRODUODENOSCOPY (EGD);  Surgeon: Georganna Skeans, MD;  Location: [place];  Service: General;  Laterality: N/A;  . KNEE SURGERY Bilateral   . PEG PLACEMENT N/A 12/09/2017   Procedure: PERCUTANEOUS ENDOSCOPIC GASTROSTOMY (PEG) PLACEMENT;  Surgeon: Georganna Skeans, MD;  Location[facility] ENDOSCOPY;  Service: General;  Laterality: N/A;     OB History   No obstetric history on file.     Family History  Problem Relation Age of Onset  . Hypertension Mother   . Hypertension Father     Social History   Tobacco Use  . Smoking status: Never Smoker  . Smokeless tobacco: Never Used  Substance Use Topics  . Alcohol use: Not Currently  . Drug use: Not Currently    Home Medications Prior to Admission medications   Medication Sig Start Date End Date Taking? Authorizing Provider  acetaminophen (TYLENOL) 500 MG tablet Take 1,000 mg by mouth 3 (three) times daily.   Yes [provider]  amLODipine (NORVASC) 10 MG tablet Take 1 tablet (10 mg total) by mouth daily. 01/08/18  Yes Layne Benton, NP  aspirin 81 MG chewable tablet Place 1 tablet (81 mg total) into feeding tube daily. Patient taking differently: Chew 81 mg by mouth daily.  02/04/19  Yes Costella, Darci Current, PA-C  atenolol (TENORMIN) 100 MG tablet Take 100 mg by mouth at bedtime.   Yes [provider]  atorvastatin (LIPITOR) 20 MG tablet Take 20 mg by mouth  at bedtime.   Yes [provider]  b complex vitamins tablet Take 1 tablet by mouth daily.   Yes [provider]  Benzocaine-Menthol (CEPACOL MT) Use as directed 1 lozenge in the mouth or throat See admin instructions. Dissolve one lozenge in mouth three times daily for one week and then use once daily   Yes [provider]  Calcium Carbonate Antacid (TUMS PO) Take 2 tablets by mouth daily.   Yes [provider]  folic acid (FOLVITE) 1 MG tablet Place 1 tablet (1 mg total) into feeding tube daily. Patient taking differently: Take 1 mg by mouth daily.  01/08/18  Yes Layne Benton, NP  levETIRAcetam (KEPPRA) 1000 MG tablet Take 1,000 mg by mouth every 12 (twelve) hours.   Yes [provider]  Lidocaine 4 % PTCH Place 1 patch onto the skin See admin instructions. Apply one patch to bilateral knees every morning, remove at bedtime   Yes [provider]  MELATONIN PO Take 8 mg by mouth at bedtime.    Yes [provider]  naproxen sodium (ALEVE) 220 MG tablet Take 220 mg by mouth 2 (two) times daily as needed (pain).   Yes [provider]  Phenol (CHLORASEPTIC MT) Use as directed 2 sprays in the mouth or throat See admin instructions. Use 2 sprays in mouth/throat three times daily for one week and then use daily as needed for sore throat   Yes [provider]  polyethylene glycol (MIRALAX / GLYCOLAX) 17 g packet Take 17 g by mouth daily as needed for moderate constipation. Mix in 4-8 oz liquid and drink   Yes [provider]  potassium chloride SA (KLOR-CON) 20 MEQ tablet Take 20 mEq by mouth every Monday, Wednesday, and Friday.   Yes [provider]  sennosides-docusate sodium (SENOKOT-S) 8.6-50 MG tablet Take 2 tablets by mouth daily as needed for constipation.   Yes [provider]  traZODone (DESYREL) 50 MG tablet Take 25 mg by mouth at bedtime as needed for sleep.   Yes [provider]    HYDROcodone-acetaminophen (NORCO/VICODIN) 5-325 MG tablet Take 1 tablet by mouth every 4 (four) hours as needed for moderate pain. 06/07/19 06/06/20  Elson Areas, PA-C  psyllium (HYDROCIL/METAMUCIL) 95 % PACK Take 1 packet by mouth 2 (two) times daily. Patient not taking: Reported on 06/07/2019 01/08/18   Layne Benton, NP    Allergies    Patient has no known allergies.  Review of Systems   Review of Systems  Musculoskeletal: Positive for neck pain.  All other systems reviewed and are negative.   Physical Exam Updated Vital Signs BP (!) 136/91   Pulse 69   Temp 98 F (36.7 C) (Oral)   Resp 20   Ht 5\' 4"  (1.626 m)   Wt 93 kg   SpO2 91%   BMI 35.19 kg/m   Physical Exam Vitals and nursing note reviewed.  Constitutional:      General:  She is not in acute distress.    Appearance: She is well-developed.  HENT:     Head: Normocephalic and atraumatic.     Mouth/Throat:     Mouth: Mucous membranes are moist.  Eyes:     Conjunctiva/sclera: Conjunctivae normal.  Neck:     Comments: Tender scarred area anterior neck  Cardiovascular:     Rate and Rhythm: Normal rate and regular rhythm.     Heart sounds: No murmur.  Pulmonary:     Effort: Pulmonary effort is normal. No respiratory distress.  Abdominal:     Palpations: Abdomen is soft.     Tenderness: There is no abdominal tenderness.  Musculoskeletal:     Cervical back: Neck supple.  Skin:    General: Skin is warm and dry.  Neurological:     Mental Status: She is alert. Mental status is at baseline.     ED Results / Procedures / Treatments   Labs (all labs ordered are listed, but only abnormal results are displayed) Labs Reviewed - No data to display  EKG None  Radiology No results found.  Procedures Procedures (including critical care time)  Medications Ordered in ED Medications  fentaNYL (SUBLIMAZE) injection 50 mcg (50 mcg Intramuscular Given 06/07/19 1608)    ED Course  I have reviewed the triage  vital signs and the nursing notes.  Pertinent labs & imaging results that were available during my care of the patient were reviewed by me and considered in my medical decision making (see chart for details).    MDM Rules/Calculators/A&P                      MDM: Pt given fentanyl IM  Rx for hydrocodone  Final Clinical Impression(s) / ED Diagnoses Final diagnoses:  Neck pain    Rx / DC Orders ED Discharge Orders         Ordered    HYDROcodone-acetaminophen (NORCO/VICODIN) 5-325 MG tablet  Every 4 hours PRN     06/07/19 2031        An After Visit Summary was printed and given to the patient.    Osie Cheeks 06/07/19 2036    Donnetta Hutching, MD 06/10/19 [phone]

## 2019-06-07 NOTE — ED Notes (Signed)
[facility] called for pt, stated they have a couple ahead

## 2019-06-07 NOTE — ED Triage Notes (Addendum)
Pt from [facility]. C/O of anterior neck pain. 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 Pt is A/Ox4. Pt had cric a few months ago and states the pain is where the insertion point was. Denies SOB, difficulty swallowing. Pt in NAD.

## 2019-06-28 ENCOUNTER — Other Ambulatory Visit: Payer: Self-pay

## 2019-06-28 ENCOUNTER — Encounter: Payer: Self-pay | Admitting: Adult Health

## 2019-06-28 ENCOUNTER — Ambulatory Visit: Payer: Medicaid Other | Admitting: Adult Health

## 2019-06-28 VITALS — BP 138/89 | HR 73 | Temp 97.9°F | Ht 64.0 in | Wt 202.6 lb

## 2019-06-28 DIAGNOSIS — I639 Cerebral infarction, unspecified: Secondary | ICD-10-CM

## 2019-06-28 DIAGNOSIS — G8111 Spastic hemiplegia affecting right dominant side: Secondary | ICD-10-CM

## 2019-06-28 DIAGNOSIS — I611 Nontraumatic intracerebral hemorrhage in hemisphere, cortical: Secondary | ICD-10-CM

## 2019-06-28 DIAGNOSIS — I10 Essential (primary) hypertension: Secondary | ICD-10-CM

## 2019-06-28 DIAGNOSIS — E785 Hyperlipidemia, unspecified: Secondary | ICD-10-CM

## 2019-06-28 DIAGNOSIS — I69320 Aphasia following cerebral infarction: Secondary | ICD-10-CM | POA: Diagnosis not present

## 2019-06-28 DIAGNOSIS — R569 Unspecified convulsions: Secondary | ICD-10-CM

## 2019-06-28 NOTE — Progress Notes (Signed)
I agree with the above plan 

## 2019-06-28 NOTE — Progress Notes (Signed)
[facility] [address] [place]. [place] [address] [phone]       OFFICE FOLLOW UP NOTE  Tammie Miles Date of Birth:  [birth year] Medical Record Number:  [id number]   Reason for visit: Left MCA infarct with hemorrhagic transformation   CHIEF COMPLAINT:  Chief Complaint  Patient presents with  . Follow-up    Rm treatment rm, stays at [facility],  Needing clearance for L knee replacement ( Maternal aunt )  . Cerebrovascular Accident    HPI:  Today, 06/28/2019, Tammie Miles returns for follow-up with history of stroke as well as discussion regarding undergoing left knee replacement procedure.  She is accompanied today by her aunt. She continues to reside at [facility] with residual stroke deficits of right hemiparesis and aphasia which have been stable with patient reporting some improvement.  She has not been participating in therapies which she is frustrated about.  Per aunt, therapy discontinued due to lack of staff.  History of chronic knee pain previously undergoing right knee arthroplasty and has been having greater difficulty with left knee without benefit from injections.  Continues to follow with orthopedics and per aunt, concerns regarding recovery in setting of residual stroke deficits.  Ambulates short distance but limited due to residual right hemiparesis and L>R knee pain.  Continues on Keppra 1000 mg twice daily tolerating well without recurrent seizure activity.  She underwent cranioplasty on 01/22/2019 without complication.  Continues on aspirin 81 mg daily and atorvastatin 20 mg daily for secondary stroke prevention.  Blood pressure today 138/89.  No concerns at this time.    History provided for reference purposes only Update 11/26/2018 JM: Tammie Miles is being seen today for stroke follow-up accompanied by her aunt.  Prior follow-up appointments canceled/rescheduled due to COVID-19 safety restrictions and difficulty with scheduling virtual visit.   She continues to reside at [facility].  Residual stroke deficits of right hemiparesis and severe expressive > mild to moderate receptive aphasia.  She is ambulatory for very short distances and assistance needed.  Aunt states she was doing well with improvement of ambulation but this worsened after seizure activity in 05/2018 and lack of therapy.  She was admitted to ED on 05/20/2018 due to seizure activity but denies additional seizure since that time.  She continues on Keppra without side effects.  Referral placed at prior visit to follow-up with neurosurgery but unfortunately due to difficulty getting a hold of patient/facility, appointment was never made. Skullflap located right abdomen.  She continues on aspirin 81 mg daily for secondary stroke prevention without side effects.  Blood pressure today satisfactory at 135/89.  Denies new or worsening stroke/TIA symptoms.  Initial visit 03/05/2018 JM: Patient is being seen today for hospital follow-up and is accompanied by her aunt.  She continues to reside at [facility] rehabilitation center.  She continues to receive PT/OT/ST. she continues to have LUE flaccid and expressive > receptive aphasia.  Severe expressive aphasia present with only being able to say "no" but is able to nod her head appropriately.  Mild to moderate receptive aphasia present.  She has had PEG tube removed and is currently obtaining all nutrition and pills orally.  She denies any complication in doing so and denies any aspiration or coughing with eating.  She is able to ambulate without assistance and denies any recent falls.  She needs minimal assistance with ADLs such as bathing and dressing.  She does feel as though she has been improving.  Continues on  aspirin 81 mg without side effects of bleeding or bruising.  She continues on Keppra without recurrent seizures and has been tolerating well.  Blood pressure today satisfactory 135/82.  She denies follow-up  appointment with neurosurgery since hospital discharge as recommended.  She does have skullcap placed right side of abdomen.  No further concerns at this time.  Denies new or worsening stroke/TIA symptoms.  Stroke admission: Tammie Miles is a 44 y.o. Femalewith PMH of HTN on hypertensive medications, antidepressants, stimulants, and Xanax which are all prescribed by her primary physician.  Per discharge summary notes by Dr. Leonie Man, after awakening from a nap on 11/26/2017 she was noted to not be acting correctly and was dropping things.  Her 77-year-old daughter called the father multiple times and when he went and checked on her he believes that she was drunk and she was obtunded but moving her arms.  The following morning on 11/27/2017, she remained obtunded and the husband called 911 where she was brought into the emergency room via EMS.  Initial CT head reviewed and showed large area of hemorrhagic left basal ganglia and left frontal lobe was noted with a 5.8 x 3.5 cm along with 15 mm shift from the left to right with early herniation.  Neurosurgery was immediately called for decompression.  Initial hematoma evacuation performed and after neuro worsening, hemicraniectomy was performed a few days later.  Initial insult felt to be ischemic with large hemorrhagic transformation and continued bleeding.  Unfortunately, patient had a prolonged hospitalization due to lack of family support and insurance for rehab.  Patient was started on Keppra for seizure prevention after episode of stiffness and tongue biting on 12/03/2017.  EEG did not show seizure activity.  Due to acute respiratory insufficiency, patient was intubated and did undergo tracheostomy on 12/09/2017.  Blood pressure initially found to be elevated on admission but stabilized throughout hospitalization.  Due to continued dysphasia, PEG tube placed on 12/09/2017 for nutritional support.  Aspirin 81 mg initiated for secondary stroke prevention.  Therapies  recommended SNF but due to lack of bed placement, patient was transferred to CIR for ongoing PT/OT/ST.  During CIR stay, patient was decannulated on 01/09/2018.  Due to continued therapy needs, patient was [facility] on 01/20/2018.      ROS:   14 system review of systems performed and negative with exception of speech difficulty, weakness, joint pain and gait impairment  PMH:  Past Medical History:  Diagnosis Date  . ADHD   . Anxiety   . Aphasia   . Arthritis   . Depression   . Hypertension   . Seizures ([place])   . Stroke [facility]     PSH:  Past Surgical History:  Procedure Laterality Date  . CRANIOPLASTY Left 01/22/2019   Procedure: CRANIOPLASTY;  Surgeon: Consuella Lose, MD;  Location: [facility];  Service: Neurosurgery;  Laterality: Left;  CRANIOPLASTY  . CRANIOTOMY Left 11/27/2017   Procedure: CRANIOTOMY for Evacuation of Intracranial Hemorrhage;  Surgeon: Consuella Lose, MD;  Location: [facility];  Service: Neurosurgery;  Laterality: Left;  CRANIOTOMY for Evacuation of Intracranial Hemorrhage  . CRANIOTOMY Left 11/28/2017   Procedure: Decompressive craniectomy;  Surgeon: Erline Levine, MD;  Location: [facility];  Service: Neurosurgery;  Laterality: Left;  . ESOPHAGOGASTRODUODENOSCOPY N/A 12/09/2017   Procedure: ESOPHAGOGASTRODUODENOSCOPY (EGD);  Surgeon: Georganna Skeans, MD;  Location: [facility];  Service: General;  Laterality: N/A;  . KNEE SURGERY Bilateral   . PEG PLACEMENT N/A 12/09/2017  Procedure: PERCUTANEOUS ENDOSCOPIC GASTROSTOMY (PEG) PLACEMENT;  Surgeon: Violeta Gelinas, MD;  [facility] ENDOSCOPY;  Service: General;  Laterality: N/A;    Social History:  Social History   Socioeconomic History  . Marital status: Married    Spouse name: Not on file  . Number of children: 1  . Years of education: Not on file  . Highest education level: Not on file  Occupational History  . Not on file  Tobacco Use  . Smoking status:  Never Smoker  . Smokeless tobacco: Never Used  Substance and Sexual Activity  . Alcohol use: Not Currently  . Drug use: Not Currently  . Sexual activity: Yes  Other Topics Concern  . Not on file  Social History Narrative  . Not on file   Social Determinants of Health   Financial Resource Strain:   . Difficulty of Paying Living Expenses:   Food Insecurity:   . Worried About Programme researcher, broadcasting/film/video in the Last Year:   . Barista in the Last Year:   Transportation Needs:   . Freight forwarder (Medical):   Marland Kitchen Lack of Transportation (Non-Medical):   Physical Activity:   . Days of Exercise per Week:   . Minutes of Exercise per Session:   Stress:   . Feeling of Stress :   Social Connections:   . Frequency of Communication with Friends and Family:   . Frequency of Social Gatherings with Friends and Family:   . Attends Religious Services:   . Active Member of Clubs or Organizations:   . Attends Banker Meetings:   Marland Kitchen Marital Status:   Intimate Partner Violence:   . Fear of Current or Ex-Partner:   . Emotionally Abused:   Marland Kitchen Physically Abused:   . Sexually Abused:     Family History:  Family History  Problem Relation Age of Onset  . Hypertension Mother   . Hypertension Father     Medications:   Current Outpatient Medications on File Prior to Visit  Medication Sig Dispense Refill  . acetaminophen (TYLENOL) 500 MG tablet Take 1,000 mg by mouth 3 (three) times daily.    Marland Kitchen acetaminophen-codeine (TYLENOL #3) 300-30 MG tablet Take 1 tablet by mouth 2 (two) times daily as needed for moderate pain.    Marland Kitchen aspirin 81 MG chewable tablet Place 1 tablet (81 mg total) into feeding tube daily. (Patient taking differently: Chew 81 mg by mouth daily. )    . atenolol (TENORMIN) 100 MG tablet Take 100 mg by mouth at bedtime.    Marland Kitchen atorvastatin (LIPITOR) 20 MG tablet Take 20 mg by mouth daily.    Marland Kitchen b complex vitamins tablet Take 1 tablet by mouth daily.    . folic acid  (FOLVITE) 1 MG tablet Take 1 mg by mouth daily.    Marland Kitchen levETIRAcetam (KEPPRA) 1000 MG tablet Take 1,000 mg by mouth every 12 (twelve) hours.    . Lidocaine 4 % OINT Apply topically. Daily and BID prn to old trach site    . MELATONIN PO Take 8 mg by mouth at bedtime.     . naproxen sodium (ALEVE) 220 MG tablet Take 220 mg by mouth 2 (two) times daily as needed (pain).    . polyethylene glycol (MIRALAX / GLYCOLAX) 17 g packet Take 17 g by mouth daily as needed for moderate constipation. Mix in 4-8 oz liquid and drink    . potassium chloride SA (KLOR-CON) 20 MEQ tablet Take 20 mEq by  mouth every Monday, Wednesday, and Friday.    . sennosides-docusate sodium (SENOKOT-S) 8.6-50 MG tablet Take 2 tablets by mouth daily as needed for constipation.     No current facility-administered medications on file prior to visit.    Allergies:  No Known Allergies   Physical Exam  Vitals:   06/28/19 1342  BP: 138/89  Pulse: 73  Temp: 97.9 F (36.6 C)  Weight: 202 lb 9.6 oz (91.9 kg)  Height: 5\' 4"  (1.626 m)   Body mass index is 34.78 kg/m. No exam data present  General: well developed, well nourished, pleasant African-American female, seated, in no evident distress Head: head normocephalic and atraumatic.   Neck: supple with no carotid or supraclavicular bruits Cardiovascular: regular rate and rhythm, no murmurs Musculoskeletal: no deformity Skin:  no rash/petichiae Vascular:  Normal pulses all extremities  Neurologic Exam Mental Status: Awake and fully alert. Severe expressive aphasia with mild receptive aphasia.  No evidence of cognitive difficulties.  Mood and affect appropriate and cooperative with exam Cranial Nerves: Pupils equal, briskly reactive to light. Extraocular movements full without nystagmus. Visual fields full unable to assess as patient unable to follow commands appropriately. Hearing intact. Facial sensation intact.  Right lower facial weakness Motor:  RUE: 1/5 with spasticity  greater than deltoid and fingers; RLE: 4/5 hip flexor and ankle dorsiflexion weakness. full strength left upper and lower extremity Sensory.: intact to touch , pinprick , position and vibratory sensation.  Coordination: Rapid alternating movements normal except RUE. Finger-to-nose performed accurately in left upper extremity and heel-to-shin performed accurately . Gait and Station: Arises from chair without difficulty. Stance is normal. Gait demonstrates hemiplegic gait and able to take a couple steps without assistance Reflexes: 2+ right side and 1+ right side. Toes downgoing.       ASSESSMENT: Tammie Miles is a 44 y.o. 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  Cranioplasty on 01/22/2019 without complication.  Initial insult felt to be ischemic with large hemorrhagic transformation and continued bleeding.  Hospitalization complicated by acute respiratory insufficiency status post trach with decannulation, seizure, hypertensive emergency, anemia, and dysphasia status post PEG. Vascular risk factors include HTN and depression.  Recurrent seizure activity 05/2018 currently on Keppra 1000 mg twice daily controlled.  Residual deficits of severe expressive aphasia with mild receptive aphasia and right spastic hemiparesis.  She questions potentially undergoing left knee arthroplasty    PLAN:  1. Left MCA infarct with hemorrhagic transformation:  -Residual deficits: Discussion with patient regarding decrease chance for further recovery as initial stroke 11/2017 but unfortunately had worsening of deficits with recurrent seizure activity in 05/2018.  Both patient and aunt confirm slow but ongoing improvement therefore request participation in physical and speech therapy.  May potentially  benefit from muscle relaxant such as baclofen or tizanidine for spasticity due to RUE pain and limited mobility of RLE likely interfering with ambulation.  Has follow-up visit tomorrow with orthopedics -advise further discussion regarding possible benefit with muscle relaxants in regards to bilateral knee pain as well as muscle spasticity.  Advised aunt that if not recommended by orthopedics, to call office and will trial baclofen for possible benefit -Continue aspirin 81 mg daily and atorvastatin 20 mg daily for secondary stroke prevention. -Maintain strict control of hypertension with blood pressure goal below 130/90, diabetes with hemoglobin  A1c goal below 6.5% and cholesterol with LDL cholesterol (bad cholesterol) goal below 70 mg/dL.  I also advised the patient to eat a healthy diet with plenty of whole grains, cereals, fruits and vegetables, exercise regularly with at least 30 minutes of continuous activity daily and maintain ideal body weight.  Advised patient to continue PT/OT/ST for continued deficits 2. Seizure, post stroke: Continuation of Keppra 1000 mg twice daily for seizure prophylaxis 3. Left knee pain: From a stroke standpoint, cleared to proceed with procedure as she has been stable from a stroke standpoint since 11/2017.  Concern for recovery potential due to gait impairment and limited ambulation from prior stroke deficits but also question component of knee pain contributing.  Advise further discussion with orthopedics regarding risk versus benefit prior to proceeding. 4. HTN: Ongoing monitoring management per PCP/facility 5. HLD: Continuation of atorvastatin and ongoing prescribing, monitoring and management to PCP/facility   Follow up in 4 months or call earlier if needed   I spent 40 minutes of face-to-face and non-face-to-face time with patient and aunt.  This included previsit chart review, lab review, study review, order entry, electronic health record documentation, patient  education regarding prior stroke, importance of managing stroke risk factors, residual deficits, post stroke seizure, possible left knee procedure and answered all questions to patient and aunts satisfaction   Tammie Miles, AGNP-BC  [facility] [address] [place], [place] [address]  Phone [phone] Fax [phone] Note: This document was prepared with digital dictation and possible smart phrase technology. Any transcriptional errors that result from this process are unintentional.

## 2019-06-28 NOTE — Patient Instructions (Addendum)
Please initiate physical and speech therapy for ongoing residual stroke deficits - patient requesting possible left knee replacement procedure currently being followed by orthopedics   In regards to possible knee procedure, from a stroke standpoint you are cleared to proceed with procedure. In regards to recovery after procedure, concern is related to residual right sided deficits limiting mobility which may cause difficulty with recovery. Consider risk vs benefit as far as is there a potential for making knee pain and ambulation worse vs potential for improving ambulation and pain. Please have your orthopedic provider fax the office note from tomorrows appointment for further review   Speak to orthopedics tomorrow in regard to use of baclofen or tizanidine for muscle spasticity and pain with residual stroke weakness and arthritis pain   Continue aspirin 81 mg daily  and atorvastatin  for secondary stroke prevention  Continue to follow up with PCP regarding choleserol and blood pressure management   Maintain strict control of hypertension with blood pressure goal below 130/90, diabetes with hemoglobin A1c goal below 6.5% and cholesterol with LDL cholesterol (bad cholesterol) goal below 70 mg/dL. I also advised the patient to eat a healthy diet with plenty of whole grains, cereals, fruits and vegetables, exercise regularly and maintain ideal body weight.  Followup in the future with me in 4 months or call earlier if needed       Thank you for coming to see [place] at [facility]. I hope we have been able to provide you high quality care today.  You may receive a patient satisfaction survey over the next few weeks. We would appreciate your feedback and comments so that we may continue to improve ourselves and the health of our patients.

## 2019-07-13 ENCOUNTER — Telehealth: Payer: Self-pay | Admitting: *Deleted

## 2019-07-13 NOTE — Telephone Encounter (Signed)
Completed form and placed in out box.  Thank you

## 2019-07-13 NOTE — Telephone Encounter (Signed)
Surgical clearance faxed back to Emilio Aspen, [facility] with fax confirmation along with last ofv note. [phone], [phone] ofv.

## 2019-07-15 NOTE — Telephone Encounter (Signed)
Closing old chart note 

## 2019-07-23 ENCOUNTER — Other Ambulatory Visit: Payer: Self-pay | Admitting: Orthopedic Surgery

## 2019-07-29 ENCOUNTER — Encounter (HOSPITAL_COMMUNITY): Payer: Self-pay | Admitting: Orthopedic Surgery

## 2019-07-30 NOTE — Progress Notes (Signed)
Preop instructions for: Tammie Miles                         Date of Birth : 1976/03/04                           Date of Procedure: 08/13/19       Doctor: Dr Dorna Leitz  Time to arrive at [facility]: 0530am Report to: Admitting  Procedure:right knee arthroscopy    Do not eat  past midnight the night before your procedure.(To include any tube feedings-must be discontinued) May have clear liquids from 87midnite until 0430am morning of surgery then nothing by mouth.      CLEAR LIQUID DIET   Foods Allowed                                                                     Foods Excluded  Coffee and tea, regular and decaf                             liquids that you cannot  Plain Jell-O any favor except red or purple                                           see through such as: Fruit ices (not with fruit pulp)                                     milk, soups, orange juice  Iced Popsicles                                    All solid food Carbonated beverages, regular and diet                                    Cranberry, grape and apple juices Sports drinks like Gatorade Lightly seasoned clear broth or consume(fat free) Sugar, honey syrup  Sample Menu Breakfast                                Lunch                                     Supper Cranberry juice                    Beef broth                            Chicken broth Jell-O  Grape juice                           Apple juice Coffee or tea                        Jell-O                                      Popsicle                                                Coffee or tea                        Coffee or tea  _____________________________________________________________________     Take these morning medications only with sips of water.(or give through gastrostomy or feeding tube). Amlodipine, Keppra     Facility contact:  [employer]                 Phone:  [phone]                 Health Care POA:  Transportation contact phone#: [facility]  Please send day of procedure:current med list and meds last taken that day, confirm nothing by mouth status from what time, Patient Demographic info( to include DNR status, problem list, allergies)   RN contact name/phone#:                             and Fax #:[phone]  Bring Insurance card and picture ID Leave all jewelry and other valuables at place where living( no metal or rings to be worn) No contact lens Women-no make-up, no lotions,perfumes,powders   Any questions day of procedure,call  SHORT [phone]   Sent from :[facility]                   Phone:[phone]                   Fax:[phone]  Sent by :  Cyndia Diver RN

## 2019-08-04 NOTE — Progress Notes (Addendum)
Anesthesia Review: Resident at [facility] due to stroke Has aphasia In wheelchair can ambulate some.  PCP: Yvette Rack [phone] or Isac Sarna [phone] or Wandra Mannan [phone] Cardiologist : Neuro: [place]- LOV 06/28/19 - is aware of surgery in note  * see note regarding recommendations - note in epic  Chest x-ray : EKG : Echo :2019 - epic  Cardiac Cath :  Sleep Study/ CPAP : Fasting Blood Sugar :      / Checks Blood Sugar -- times a day:   Blood Thinner/ Instructions /Last Dose: ASA / Instructions/ Last Dose :   Patient denies shortness of breath, chest pain, fever, and cough at this phone interview.

## 2019-08-12 ENCOUNTER — Encounter (HOSPITAL_COMMUNITY): Payer: Self-pay | Admitting: Orthopedic Surgery

## 2019-08-12 NOTE — Anesthesia Preprocedure Evaluation (Addendum)
Anesthesia Evaluation  Patient identified by MRN, date of birth, ID band Patient awake    Reviewed: Allergy & Precautions, NPO status , Patient's Chart, lab work & pertinent test results  Airway Mallampati: I  TM Distance: >3 FB Neck ROM: Full    Dental  (+) Poor Dentition, Missing, Dental Advisory Given   Pulmonary    breath sounds clear to auscultation       Cardiovascular hypertension, Pt. on medications  Rhythm:Regular Rate:Normal     Neuro/Psych Seizures -,  PSYCHIATRIC DISORDERS Anxiety Depression Hemiparesis CVA, Residual Symptoms    GI/Hepatic negative GI ROS, Neg liver ROS,   Endo/Other  negative endocrine ROS  Renal/GU negative Renal ROS     Musculoskeletal  (+) Arthritis , Osteoarthritis,    Abdominal (+) + obese,   Peds  Hematology negative hematology ROS (+)   Anesthesia Other Findings R Sided Hemiparesis  Reproductive/Obstetrics                            Anesthesia Physical Anesthesia Plan  ASA: III  Anesthesia Plan: General   Post-op Pain Management:    Induction: Intravenous  PONV Risk Score and Plan: 3 and Ondansetron, Dexamethasone and Midazolam  Airway Management Planned: LMA  Additional Equipment: None  Intra-op Plan:   Post-operative Plan: Extubation in OR  Informed Consent: I have reviewed the patients History and Physical, chart, labs and discussed the procedure including the risks, benefits and alternatives for the proposed anesthesia with the patient or authorized representative who has indicated his/her understanding and acceptance.     Dental advisory given  Plan Discussed with: CRNA  Anesthesia Plan Comments: (Covid-19 Nucleic Acid Test Results Lab Results      Component                Value               Date                      SARSCOV2NAA              NEGATIVE            01/25/2019                SARSCOV2NAA              NEGATIVE             01/20/2019            )      Anesthesia Quick Evaluation

## 2019-08-13 ENCOUNTER — Encounter (HOSPITAL_COMMUNITY): Payer: Self-pay | Admitting: Orthopedic Surgery

## 2019-08-13 ENCOUNTER — Ambulatory Visit (HOSPITAL_COMMUNITY)
Admission: RE | Admit: 2019-08-13 | Discharge: 2019-08-13 | Disposition: A | Discharge status: Home / Self Care | Payer: Medicaid Other | Attending: Orthopedic Surgery | Admitting: Orthopedic Surgery

## 2019-08-13 ENCOUNTER — Encounter (HOSPITAL_COMMUNITY)
Admission: RE | Disposition: A | Discharge status: Home / Self Care | Payer: Self-pay | Source: Home / Self Care | Attending: Orthopedic Surgery

## 2019-08-13 ENCOUNTER — Other Ambulatory Visit: Payer: Self-pay

## 2019-08-13 ENCOUNTER — Ambulatory Visit (HOSPITAL_COMMUNITY): Payer: Medicaid Other | Admitting: Physician Assistant

## 2019-08-13 DIAGNOSIS — F329 Major depressive disorder, single episode, unspecified: Secondary | ICD-10-CM | POA: Insufficient documentation

## 2019-08-13 DIAGNOSIS — M94261 Chondromalacia, right knee: Secondary | ICD-10-CM | POA: Diagnosis not present

## 2019-08-13 DIAGNOSIS — Z7982 Long term (current) use of aspirin: Secondary | ICD-10-CM | POA: Insufficient documentation

## 2019-08-13 DIAGNOSIS — Z8249 Family history of ischemic heart disease and other diseases of the circulatory system: Secondary | ICD-10-CM | POA: Insufficient documentation

## 2019-08-13 DIAGNOSIS — S83241A Other tear of medial meniscus, current injury, right knee, initial encounter: Secondary | ICD-10-CM | POA: Diagnosis not present

## 2019-08-13 DIAGNOSIS — I10 Essential (primary) hypertension: Secondary | ICD-10-CM | POA: Insufficient documentation

## 2019-08-13 DIAGNOSIS — Z79899 Other long term (current) drug therapy: Secondary | ICD-10-CM | POA: Insufficient documentation

## 2019-08-13 DIAGNOSIS — M1990 Unspecified osteoarthritis, unspecified site: Secondary | ICD-10-CM | POA: Insufficient documentation

## 2019-08-13 DIAGNOSIS — I69954 Hemiplegia and hemiparesis following unspecified cerebrovascular disease affecting left non-dominant side: Secondary | ICD-10-CM | POA: Diagnosis not present

## 2019-08-13 DIAGNOSIS — F419 Anxiety disorder, unspecified: Secondary | ICD-10-CM | POA: Diagnosis not present

## 2019-08-13 DIAGNOSIS — Z931 Gastrostomy status: Secondary | ICD-10-CM | POA: Diagnosis not present

## 2019-08-13 DIAGNOSIS — Z20822 Contact with and (suspected) exposure to covid-19: Secondary | ICD-10-CM | POA: Insufficient documentation

## 2019-08-13 DIAGNOSIS — X58XXXA Exposure to other specified factors, initial encounter: Secondary | ICD-10-CM | POA: Diagnosis not present

## 2019-08-13 HISTORY — DX: Aphasia following unspecified cerebrovascular disease: I69.920

## 2019-08-13 HISTORY — DX: Unspecified internal derangement of left knee: M23.92

## 2019-08-13 HISTORY — DX: Acute posthemorrhagic anemia: D62

## 2019-08-13 HISTORY — DX: Hemiplegia, unspecified affecting right nondominant side: G81.93

## 2019-08-13 HISTORY — PX: KNEE ARTHROSCOPY WITH MENISCAL REPAIR: SHX5653

## 2019-08-13 HISTORY — DX: Gastrostomy status: Z93.1

## 2019-08-13 LAB — CBC
HCT: 38.9 % (ref 36.0–46.0)
Hemoglobin: 12.3 g/dL (ref 12.0–15.0)
MCH: 26.1 pg (ref 26.0–34.0)
MCHC: 31.6 g/dL (ref 30.0–36.0)
MCV: 82.4 fL (ref 80.0–100.0)
Platelets: 284 10*3/uL (ref 150–400)
RBC: 4.72 MIL/uL (ref 3.87–5.11)
RDW: 14.7 % (ref 11.5–15.5)
WBC: 7.2 10*3/uL (ref 4.0–10.5)
nRBC: 0 % (ref 0.0–0.2)

## 2019-08-13 LAB — BASIC METABOLIC PANEL
Anion gap: 10 (ref 5–15)
BUN: 16 mg/dL (ref 6–20)
CO2: 22 mmol/L (ref 22–32)
Calcium: 9.1 mg/dL (ref 8.9–10.3)
Chloride: 107 mmol/L (ref 98–111)
Creatinine, Ser: 1.04 mg/dL — ABNORMAL HIGH (ref 0.44–1.00)
GFR calc Af Amer: >60 mL/min (ref >60)
GFR calc non Af Amer: >60 mL/min (ref >60)
Glucose, Bld: 122 mg/dL — ABNORMAL HIGH (ref 70–99)
Potassium: 3.6 mmol/L (ref 3.5–5.1)
Sodium: 139 mmol/L (ref 135–145)

## 2019-08-13 LAB — HCG, SERUM, QUALITATIVE: Preg, Serum: NEGATIVE

## 2019-08-13 LAB — SARS CORONAVIRUS 2 BY RT PCR (HOSPITAL ORDER, PERFORMED IN ~~LOC~~ HOSPITAL LAB): SARS Coronavirus 2: NEGATIVE

## 2019-08-13 SURGERY — ARTHROSCOPY, KNEE, WITH MENISCUS REPAIR
Anesthesia: General | Site: Knee | Laterality: Right

## 2019-08-13 MED ORDER — HYDROCODONE-ACETAMINOPHEN 5-325 MG PO TABS: 1.0000 | ORAL_TABLET | Freq: Once | ORAL | Status: AC | PRN

## 2019-08-13 MED ORDER — FENTANYL CITRATE (PF) 100 MCG/2ML IJ SOLN
INTRAMUSCULAR | Status: AC
  Filled 2019-08-13: qty 2

## 2019-08-13 MED ORDER — MIDAZOLAM HCL 2 MG/2ML IJ SOLN
INTRAMUSCULAR | Status: AC
  Filled 2019-08-13: qty 2

## 2019-08-13 MED ORDER — FENTANYL CITRATE (PF) 100 MCG/2ML IJ SOLN
25.0000 ug | INTRAMUSCULAR | Status: DC | PRN
  Administered 2019-08-13 (×2): 25 ug via INTRAVENOUS

## 2019-08-13 MED ORDER — HYDROCODONE-ACETAMINOPHEN 5-325 MG PO TABS
ORAL_TABLET | ORAL | Status: AC
  Administered 2019-08-13: 2 via ORAL
  Filled 2019-08-13: qty 2

## 2019-08-13 MED ORDER — EPHEDRINE SULFATE-NACL 50-0.9 MG/10ML-% IV SOSY
PREFILLED_SYRINGE | INTRAVENOUS | Status: DC | PRN
  Administered 2019-08-13: 15 mg via INTRAVENOUS

## 2019-08-13 MED ORDER — EPHEDRINE 5 MG/ML INJ
INTRAVENOUS | Status: AC
  Filled 2019-08-13: qty 10

## 2019-08-13 MED ORDER — ORAL CARE MOUTH RINSE: 15.0000 mL | Freq: Once | OROMUCOSAL | Status: AC

## 2019-08-13 MED ORDER — BUPIVACAINE HCL 0.25 % IJ SOLN
INTRAMUSCULAR | Status: AC
  Filled 2019-08-13: qty 1

## 2019-08-13 MED ORDER — ACETAMINOPHEN 325 MG PO TABS: 325.0000 mg | ORAL_TABLET | Freq: Once | ORAL | Status: DC | PRN

## 2019-08-13 MED ORDER — PROPOFOL 10 MG/ML IV BOLUS
INTRAVENOUS | Status: DC | PRN
  Administered 2019-08-13: 160 mg via INTRAVENOUS

## 2019-08-13 MED ORDER — MEPERIDINE HCL 50 MG/ML IJ SOLN: 6.2500 mg | INTRAMUSCULAR | Status: DC | PRN

## 2019-08-13 MED ORDER — DEXAMETHASONE SODIUM PHOSPHATE 10 MG/ML IJ SOLN
INTRAMUSCULAR | Status: DC | PRN
  Administered 2019-08-13: 10 mg via INTRAVENOUS

## 2019-08-13 MED ORDER — ONDANSETRON HCL 4 MG/2ML IJ SOLN
INTRAMUSCULAR | Status: DC | PRN
  Administered 2019-08-13: 4 mg via INTRAVENOUS

## 2019-08-13 MED ORDER — ONDANSETRON HCL 4 MG/2ML IJ SOLN
4.0000 mg | Freq: Once | INTRAMUSCULAR | Status: AC
  Administered 2019-08-13: 4 mg via INTRAVENOUS

## 2019-08-13 MED ORDER — PROMETHAZINE HCL 25 MG/ML IJ SOLN: 6.2500 mg | INTRAMUSCULAR | Status: DC | PRN

## 2019-08-13 MED ORDER — LACTATED RINGERS IV SOLN: INTRAVENOUS | Status: DC

## 2019-08-13 MED ORDER — MIDAZOLAM HCL 5 MG/5ML IJ SOLN
INTRAMUSCULAR | Status: DC | PRN
  Administered 2019-08-13: 2 mg via INTRAVENOUS

## 2019-08-13 MED ORDER — FENTANYL CITRATE (PF) 100 MCG/2ML IJ SOLN
INTRAMUSCULAR | Status: AC
  Administered 2019-08-13: 50 ug via INTRAVENOUS
  Filled 2019-08-13: qty 2

## 2019-08-13 MED ORDER — EPINEPHRINE PF 1 MG/ML IJ SOLN
INTRAMUSCULAR | Status: AC
  Filled 2019-08-13: qty 2

## 2019-08-13 MED ORDER — SODIUM CHLORIDE 0.9 % IR SOLN
Status: DC | PRN
  Administered 2019-08-13: 6000 mL

## 2019-08-13 MED ORDER — ONDANSETRON HCL 4 MG/2ML IJ SOLN
INTRAMUSCULAR | Status: AC
  Filled 2019-08-13: qty 2

## 2019-08-13 MED ORDER — BUPIVACAINE HCL (PF) 0.25 % IJ SOLN
INTRAMUSCULAR | Status: DC | PRN
  Administered 2019-08-13: 20 mL

## 2019-08-13 MED ORDER — CEFAZOLIN SODIUM-DEXTROSE 2-4 GM/100ML-% IV SOLN
2.0000 g | INTRAVENOUS | Status: AC
  Administered 2019-08-13: 2 g via INTRAVENOUS
  Filled 2019-08-13: qty 100

## 2019-08-13 MED ORDER — CHLORHEXIDINE GLUCONATE 0.12 % MT SOLN
15.0000 mL | Freq: Once | OROMUCOSAL | Status: AC
  Administered 2019-08-13: 15 mL via OROMUCOSAL

## 2019-08-13 MED ORDER — SODIUM CHLORIDE 0.9 % IR SOLN: Status: DC | PRN

## 2019-08-13 MED ORDER — DEXAMETHASONE SODIUM PHOSPHATE 10 MG/ML IJ SOLN
INTRAMUSCULAR | Status: AC
  Filled 2019-08-13: qty 1

## 2019-08-13 MED ORDER — LIDOCAINE 2% (20 MG/ML) 5 ML SYRINGE
INTRAMUSCULAR | Status: DC | PRN
  Administered 2019-08-13: 40 mg via INTRAVENOUS

## 2019-08-13 MED ORDER — ACETAMINOPHEN 160 MG/5ML PO SOLN: 325.0000 mg | Freq: Once | ORAL | Status: DC | PRN

## 2019-08-13 MED ORDER — FENTANYL CITRATE (PF) 100 MCG/2ML IJ SOLN
INTRAMUSCULAR | Status: DC | PRN
  Administered 2019-08-13: 50 ug via INTRAVENOUS
  Administered 2019-08-13 (×2): 25 ug via INTRAVENOUS

## 2019-08-13 MED ORDER — LIDOCAINE 2% (20 MG/ML) 5 ML SYRINGE
INTRAMUSCULAR | Status: AC
  Filled 2019-08-13: qty 5

## 2019-08-13 MED ORDER — PROPOFOL 10 MG/ML IV BOLUS
INTRAVENOUS | Status: AC
  Filled 2019-08-13: qty 20

## 2019-08-13 MED ORDER — ACETAMINOPHEN 10 MG/ML IV SOLN: 1000.0000 mg | Freq: Once | INTRAVENOUS | Status: DC | PRN

## 2019-08-13 SURGICAL SUPPLY — 35 items
BLADE EXCALIBUR 4.0X13 (MISCELLANEOUS) ×2 IMPLANT
BNDG ELASTIC 6X5.8 VLCR STR LF (GAUZE/BANDAGES/DRESSINGS) ×2 IMPLANT
COVER WAND RF STERILE (DRAPES) IMPLANT
DISSECTOR 4.0MM X 13CM (MISCELLANEOUS) IMPLANT
DRAPE ARTHROSCOPY W/POUCH 114 (DRAPES) ×2 IMPLANT
DRSG EMULSION OIL 3X3 NADH (GAUZE/BANDAGES/DRESSINGS) ×2 IMPLANT
DRSG PAD ABDOMINAL 8X10 ST (GAUZE/BANDAGES/DRESSINGS) ×2 IMPLANT
DURAPREP 26ML APPLICATOR (WOUND CARE) ×2 IMPLANT
ELECT MENISCUS 165MM 90D (ELECTRODE) IMPLANT
ELECT REM PT RETURN 15FT ADLT (MISCELLANEOUS) IMPLANT
GAUZE SPONGE 4X4 12PLY STRL (GAUZE/BANDAGES/DRESSINGS) ×2 IMPLANT
GLOVE BIOGEL PI IND STRL 8 (GLOVE) ×2 IMPLANT
GLOVE BIOGEL PI INDICATOR 8 (GLOVE) ×2
GLOVE ECLIPSE 7.5 STRL STRAW (GLOVE) ×4 IMPLANT
GOWN STRL REUS W/ TWL LRG LVL3 (GOWN DISPOSABLE) ×1 IMPLANT
GOWN STRL REUS W/ TWL XL LVL3 (GOWN DISPOSABLE) ×1 IMPLANT
GOWN STRL REUS W/TWL LRG LVL3 (GOWN DISPOSABLE) ×2
GOWN STRL REUS W/TWL XL LVL3 (GOWN DISPOSABLE) ×4 IMPLANT
IV NS IRRIG 3000ML ARTHROMATIC (IV SOLUTION) ×4 IMPLANT
KIT BASIN (CUSTOM PROCEDURE TRAY) ×2 IMPLANT
MANIFOLD NEPTUNE II (INSTRUMENTS) IMPLANT
NDL SAFETY ECLIPSE 18X1.5 (NEEDLE) IMPLANT
NEEDLE HYPO 18GX1.5 SHARP (NEEDLE)
PACK DSU ARTHROSCOPY (CUSTOM PROCEDURE TRAY) ×2 IMPLANT
PAD CAST 4YDX4 CTTN HI CHSV (CAST SUPPLIES) ×1 IMPLANT
PAD MASON LEG HOLDER (PIN) ×2 IMPLANT
PADDING CAST COTTON 4X4 STRL (CAST SUPPLIES) ×2
PADDING CAST COTTON 6X4 STRL (CAST SUPPLIES) ×2 IMPLANT
PENCIL SMOKE EVACUATOR (MISCELLANEOUS) IMPLANT
SUT ETHILON 4 0 PS 2 18 (SUTURE) IMPLANT
SYR 3ML LL SCALE MARK (SYRINGE) ×2 IMPLANT
TOWEL SURG RFD BLUE STRL DISP (DISPOSABLE) ×2 IMPLANT
TUBING ARTHROSCOPY IRRIG 16FT (MISCELLANEOUS) ×2 IMPLANT
WATER STERILE IRR 1000ML POUR (IV SOLUTION) ×2 IMPLANT
WRAP KNEE MAXI GEL POST OP (GAUZE/BANDAGES/DRESSINGS) ×2 IMPLANT

## 2019-08-13 NOTE — Op Note (Signed)
NAME: KELSHA, OLDER MEDICAL RECORD [id number] ACCOUNT [id number] DATE OF BIRTH:[birth year] FACILITY: [facility] LOCATION: [facility] PHYSICIAN:Rosanne Wohlfarth Starling Manns, MD  OPERATIVE REPORT  DATE OF PROCEDURE:  08/13/2019  PREOPERATIVE DIAGNOSES:   1.  Medial meniscal tear. 2.  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  POSTOPERATIVE DIAGNOSES: 1.  Medial meniscal tear. 2.  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 3.  Chondromalacia patellofemoral compartment 4.  Chondromalacia, lateral femoral condyle.  SURGEON:  Jodi Geralds, MD  ASSISTANT:  Greggory Stallion, PA-C, was present for the entire case and assisted by manipulation of the leg and closing to minimize OR time.  BRIEF HISTORY:  The patient is a 44 year old female with a long history of significant complaints of right knee pain.  She has an unusual aphasic history where she has difficulty giving history.  We followed her for about 4 months with her and her  caregiver and ultimately came to the fact that her right leg, which was her dominant leg given that she had a stroke affecting the left side, was affecting her.  MRI had been obtained, which showed medial meniscal tear as well as some chondromalacia.  We  tried injection therapy, activity modification for a long time and after failure of all of these things took her to the operating room for right knee arthroscopy.  DESCRIPTION OF PROCEDURE:  The patient was taken to the operating room after adequate anesthesia was obtained with a general anesthetic, the patient was placed on the operating table, right leg was prepped and draped in usual sterile fashion.  Following  this, routine arthroscopic examination of the knee showed that there was a significant anterior horn medial meniscal tear.  This was debrided with a suction shaver back to a smooth and stable rim.  Unfortunately, she had significant chondromalacia of the  medial tibial plateau and medial femoral condyle.  There were some fragmented  pieces of cartilage, which were debrided.  Attention was turned to the notch for the ACL was identified and noted to be normal.  Attention was turned to the lateral side where  there was 1 small area about 5 x 5 mm of complete cartilage loss.  This was debrided to a smooth and stable rim of articular cartilage.  Attention turned back up to the patellofemoral joint where there was some significant chondromalacia, which was  debrided.  This was grade III and grade IV in nature.  The knee was then copiously and thoroughly irrigated with 1500 mL of saline irrigation.  At this point, the knee was suctioned dry and the 20 mL of 0.25% Marcaine was instilled in the knee for  postoperative anesthesia.  Sterile compressive dressing was applied.  The patient taken to recovery and was noted to be in satisfactory condition.  Estimated blood loss for procedure was minimal.  CN/NUANCE  D:08/13/2019 T:08/13/2019 JOB:[id number]

## 2019-08-13 NOTE — Transfer of Care (Signed)
Immediate Anesthesia Transfer of Care Note  Patient: Tammie Miles  Procedure(s) Performed: KNEE ARTHROSCOPY WITH PARTIAL MENISECTOMY, CHONDRAPLASTY MEDIAL LATERAL, PATELLA-FEMORAL (Right Knee)  Patient Location: PACU  Anesthesia Type:General  Level of Consciousness: sedated  Airway & Oxygen Therapy: Patient Spontanous Breathing and Patient connected to face mask oxygen  Post-op Assessment: Report given to RN and Post -op Vital signs reviewed and stable  Post vital signs: Reviewed and stable  Last Vitals:  Vitals Value Taken Time  BP    Temp    Pulse 67 08/13/19 0830  Resp 16 08/13/19 0830  SpO2 98 % 08/13/19 0830  Vitals shown include unvalidated device data.  Last Pain:  Vitals:   08/13/19 0638  TempSrc: Oral  PainSc:       Patients Stated Pain Goal: 4 (08/13/19 0601)  Complications: No apparent anesthesia complications

## 2019-08-13 NOTE — Anesthesia Postprocedure Evaluation (Signed)
Anesthesia Post Note  Patient: Tammie Miles  Procedure(s) Performed: KNEE ARTHROSCOPY WITH PARTIAL MENISECTOMY, CHONDRAPLASTY MEDIAL LATERAL, PATELLA-FEMORAL (Right Knee)     Patient location during evaluation: PACU Anesthesia Type: General Level of consciousness: awake and alert Pain management: pain level controlled Vital Signs Assessment: post-procedure vital signs reviewed and stable Respiratory status: spontaneous breathing, nonlabored ventilation, respiratory function stable and patient connected to nasal cannula oxygen Cardiovascular status: blood pressure returned to baseline and stable Postop Assessment: no apparent nausea or vomiting Anesthetic complications: no    Last Vitals:  Vitals:   08/13/19 1030 08/13/19 1035  BP: (!) 145/89 (!) 144/98  Pulse: 72 69  Resp: 19 16  Temp: 36.7 C 36.7 C  SpO2: 92% 90%    Last Pain:  Vitals:   08/13/19 1035  TempSrc:   PainSc: 3                  Shelton Silvas

## 2019-08-13 NOTE — H&P (Signed)
A pre op hand p   Chief Complaint: Right knee pain  HPI: Tammie Miles is a 44 y.o. female who presents for evaluation of right knee pain. It has been present for greater than 3 months and has been worsening. She has failed conservative measures. Pain is rated as moderate.  Past Medical History:  Diagnosis Date  . Acute posthemorrhagic anemia    hx of   . ADHD   . Anxiety   . Aphasia   . Aphasia following unspecified cerebrovascular disease   . Arthritis   . Depression   . 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HCC)   . Hemiplegia affecting right nondominant side (HCC)   . Hypertension   . Internal derangement of left knee   . Seizures (HCC)   . 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 CRANIOPLASTY Left 01/22/2019   Procedure: CRANIOPLASTY;  Surgeon: Lisbeth Renshaw, MD;  Location: [facility] OR;  Service: Neurosurgery;  Laterality: Left;  CRANIOPLASTY  . CRANIOTOMY Left 11/27/2017   Procedure: CRANIOTOMY for Evacuation of Intracranial Hemorrhage;  Surgeon: Lisbeth Renshaw, MD;  Location: [facility] OR;  Service: Neurosurgery;  Laterality: Left;  CRANIOTOMY for Evacuation of Intracranial Hemorrhage  . CRANIOTOMY Left 11/28/2017   Procedure: Decompressive craniectomy;  Surgeon: Maeola Harman, MD;  Location: [facility] OR;  Service: Neurosurgery;  Laterality: Left;  . ESOPHAGOGASTRODUODENOSCOPY N/A 12/09/2017   Procedure: ESOPHAGOGASTRODUODENOSCOPY (EGD);  Surgeon: Violeta Gelinas, MD;  Location: [facility] ENDOSCOPY;  Service: General;  Laterality: N/A;  . KNEE SURGERY Bilateral   . PEG PLACEMENT N/A 12/09/2017   Procedure: PERCUTANEOUS ENDOSCOPIC GASTROSTOMY (PEG) PLACEMENT;  Surgeon: Violeta Gelinas, MD;  Location: [facility] ENDOSCOPY;  Service: General;  Laterality: N/A;   Social History   Socioeconomic History  . Marital status: Married    Spouse name: Not on file  . Number of children: 1  . Years of education: Not  on file  . Highest education level: Not on file  Occupational History  . Not on file  Tobacco Use  . Smoking status: Never Smoker  . Smokeless tobacco: Never Used  Substance and Sexual Activity  . Alcohol use: Not Currently  . Drug use: Not Currently  . Sexual activity: Yes  Other Topics Concern  . Not on file  Social History Narrative  . Not on file   Social Determinants of Health   Financial Resource Strain:   . Difficulty of Paying Living Expenses:   Food Insecurity:   . Worried About Programme researcher, broadcasting/film/video in the Last Year:   . Barista in the Last Year:   Transportation Needs:   . Freight forwarder (Medical):   Marland Kitchen Lack of Transportation (Non-Medical):   Physical Activity:   . Days of Exercise per Week:   . Minutes of Exercise per Session:   Stress:   . Feeling of Stress :   Social Connections:   . Frequency of Communication with Friends and Family:   . Frequency of Social Gatherings with Friends and Family:   . Attends Religious Services:   . Active Member of Clubs or Organizations:   . Attends Banker Meetings:   Marland Kitchen Marital Status:    Family History  Problem Relation Age of Onset  . Hypertension Mother   . Hypertension Father    No Known Allergies Prior to Admission medications   Medication Sig Start Date End Date  Taking? Authorizing Provider  acetaminophen (TYLENOL) 500 MG tablet Take 1,000 mg by mouth 3 (three) times daily.   Yes [provider]  amLODipine (NORVASC) 10 MG tablet Take 10 mg by mouth daily.   Yes [provider]  aspirin 81 MG chewable tablet Place 1 tablet (81 mg total) into feeding tube daily. Patient taking differently: Chew 81 mg by mouth daily.  02/04/19  Yes Costella, Vista Mink, PA-C  atenolol (TENORMIN) 100 MG tablet Take 100 mg by mouth at bedtime.   Yes [provider]  atorvastatin (LIPITOR) 40 MG tablet Take 40 mg by mouth at bedtime.   Yes [provider]  Azelastine HCl  137 MCG/SPRAY SOLN Place 1 spray into the nose daily.   Yes [provider]  b complex vitamins tablet Take 1 tablet by mouth daily.   Yes [provider]  baclofen (LIORESAL) 10 MG tablet Take 10 mg by mouth at bedtime.   Yes [provider]  cetirizine (ZYRTEC) 5 MG tablet Take 5 mg by mouth daily.   Yes [provider]  citalopram (CELEXA) 10 MG tablet Take 10 mg by mouth at bedtime.   Yes [provider]  levETIRAcetam (KEPPRA) 1000 MG tablet Take 1,000 mg by mouth every 12 (twelve) hours.   Yes [provider]  MELATONIN PO Take 8 mg by mouth at bedtime.    Yes [provider]  acetaminophen-codeine (TYLENOL #3) 300-30 MG tablet Take 1 tablet by mouth 2 (two) times daily as needed for moderate pain.    [provider]  GUAIFENESIN PO Take by mouth.    [provider]  lidocaine (LMX) 4 % cream Apply 1 application topically See admin instructions. Apply to old trach site once daily, may use an additional 2 times daily as needed for pain at old trach site    [provider]  naproxen sodium (ALEVE) 220 MG tablet Take 220 mg by mouth 2 (two) times daily as needed (pain).    [provider]  polyethylene glycol (MIRALAX / GLYCOLAX) 17 g packet Take 17 g by mouth daily as needed for moderate constipation. Mix in 4-8 oz liquid and drink    [provider]  potassium chloride SA (KLOR-CON) 20 MEQ tablet Take 20 mEq by mouth every Monday, Wednesday, and Friday.    [provider]  sennosides-docusate sodium (SENOKOT-S) 8.6-50 MG tablet Take 2 tablets by mouth daily as needed for constipation.    [provider]  traZODone (DESYREL) 100 MG tablet Take 100 mg by mouth at bedtime.    [provider]     Positive ROS: None  All other systems have been reviewed and were otherwise negative with the exception of those mentioned in the HPI and as above.  Physical  Exam: Vitals:   08/13/19 0638  BP: (!) 140/91  Pulse: 60  Resp: 18  Temp: 98.1 F (36.7 C)  SpO2: 97%    General: Alert, no acute distress Cardiovascular: No pedal edema Respiratory: No cyanosis, no use of accessory musculature GI: No organomegaly, abdomen is soft and non-tender Skin: No lesions in the area of chief complaint Neurologic: Sensation intact distally Psychiatric: Patient is competent for consent with normal mood and affect Lymphatic: No axillary or cervical lymphadenopathy  MUSCULOSKELETAL: Right knee: Painful range of motion.  Limited range of motion.  Trace effusion.  No instability.  Medial joint line tenderness.  Positive McMurray.  Assessment/Plan: RIGHT KNEE MEDIAL MENISCAL TEAR Plan for  Procedure(s): KNEE ARTHROSCOPY WITH MENISCAL debridement  The risks benefits and alternatives were discussed with the patient including but not limited to the risks of nonoperative treatment, versus surgical intervention including infection, bleeding, nerve injury, malunion, nonunion, hardware prominence, hardware failure, need for hardware removal, blood clots, cardiopulmonary complications, morbidity, mortality, among others, and they were willing to proceed.  Predicted outcome is good, although there will be at least a six to nine month expected recovery.  Harvie Junior, MD 08/13/2019 7:32 AM

## 2019-08-13 NOTE — Discharge Instructions (Signed)
Keep bandage in place clean and dry until Monday.  Patient can change bandage at that point and cover the wounds with Band-Aids. Patient should keep the bandage covered if she showers prior to Monday. Patient should elevate the leg and ice multiple times a day. Patient should use crutch or walker or wheelchair as needed but may advance walking to her tolerance.  There is no restrictions to her weightbearing. Patient will have Norco 5 mg every 6 hours ordered through our office to her pharmacy.  We have discussed this with her caregiver.

## 2019-08-13 NOTE — Anesthesia Procedure Notes (Signed)
Procedure Name: LMA Insertion Date/Time: 08/13/2019 7:47 AM Performed by: Jhonnie Garner, CRNA Pre-anesthesia Checklist: Patient identified, Emergency Drugs available, Suction available and Patient being monitored Patient Re-evaluated:Patient Re-evaluated prior to induction Oxygen Delivery Method: Circle system utilized Preoxygenation: Pre-oxygenation with 100% oxygen Induction Type: IV induction Ventilation: Mask ventilation without difficulty LMA: LMA inserted LMA Size: 4.0 Number of attempts: 1 Placement Confirmation: positive ETCO2 and breath sounds checked- equal and bilateral Tube secured with: Tape Dental Injury: Teeth and Oropharynx as per pre-operative assessment

## 2019-08-13 NOTE — Brief Op Note (Signed)
08/13/2019  8:21 AM  PATIENT:  [name]  44 y.o. female  PRE-OPERATIVE DIAGNOSIS:  RIGHT KNEE MEDIAL MENISCAL TEAR  POST-OPERATIVE DIAGNOSIS:  RIGHT KNEE MEDIAL MENISCAL TEAR  PROCEDURE:  Procedure(s): KNEE ARTHROSCOPY WITH PARTIAL MENISECTOMY, CHONDRAPLASTY MEDIAL LATERAL, PATELLA-FEMORAL (Right)  SURGEON:  Surgeon(s) and Role:    Jodi Geralds, MD - Primary  PHYSICIAN ASSISTANT:   ASSISTANTS: Greggory Stallion  ANESTHESIA:   general  EBL:  5 mL   BLOOD ADMINISTERED:none  DRAINS: none   LOCAL MEDICATIONS USED:  MARCAINE     SPECIMEN:  No Specimen  DISPOSITION OF SPECIMEN:  N/A  COUNTS:  YES  TOURNIQUET:  * No tourniquets in log *  DICTATION: .Other Dictation: Dictation Number [id number]  PLAN OF CARE: Discharge to home after PACU  PATIENT DISPOSITION:  PACU - hemodynamically stable.   Delay start of Pharmacological VTE agent (>24hrs) due to surgical blood loss or risk of bleeding: no

## 2019-09-22 IMAGING — CT CT HEAD W/O CM
3 of 4 series · 14 of 47 positions shown, 16 images · non-contrast
Comparison: 11/27/2017 MRI head.  11/28/2017 CT head.

CLINICAL DATA: 42 y/o  F; stroke for follow-up.

EXAM:
CT HEAD WITHOUT CONTRAST
TECHNIQUE: Contiguous axial images were obtained from the base of the skull
through the vertex without intravenous contrast.

[Series 4: head 2.0 h70h · axial · 0.43mm/px · z∈[-115,+9]mm · 8 of 78 slices shown, 10 images]
[im 8/78  brain]
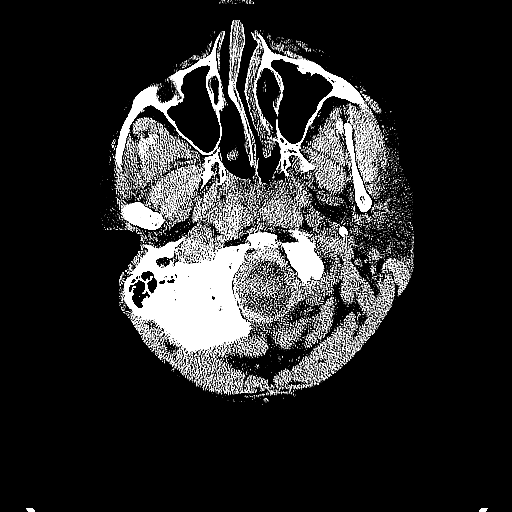
[im 8/78  bone]
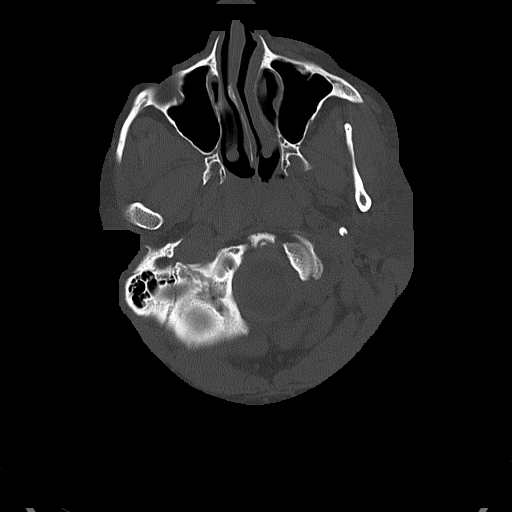
[im 16/78  brain]
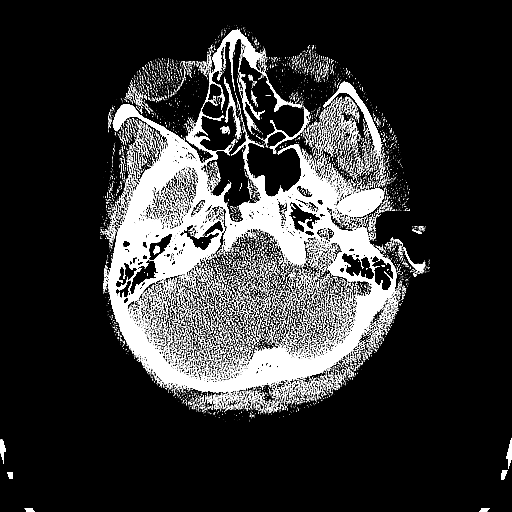
[im 24/78  brain]
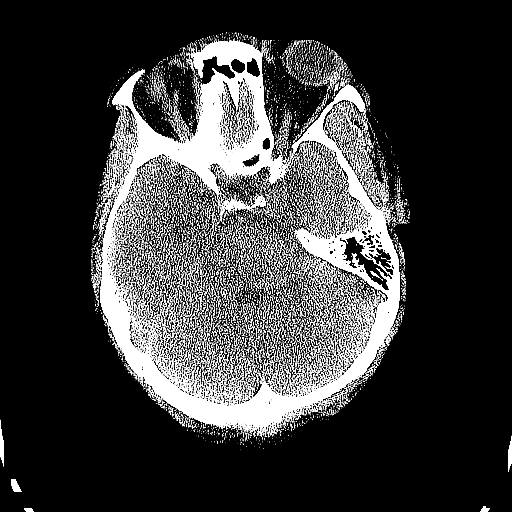
[im 35/78  brain]
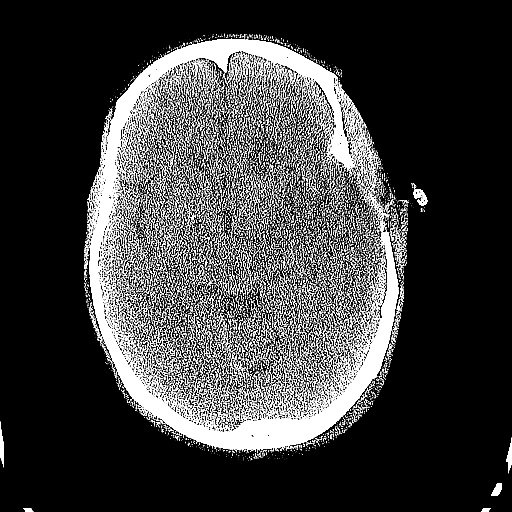
[im 43/78  brain]
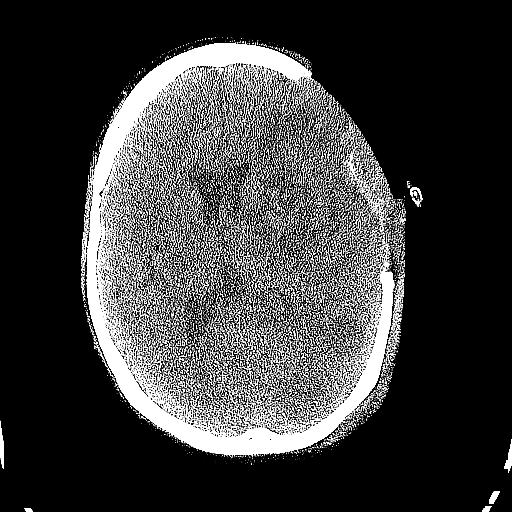
[im 43/78  bone]
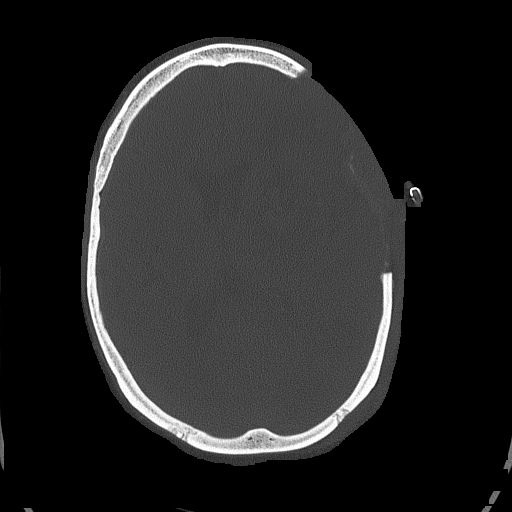
[im 54/78  brain]
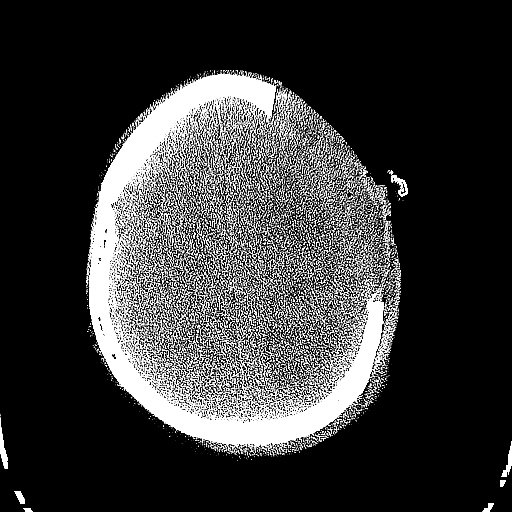
[im 62/78  brain]
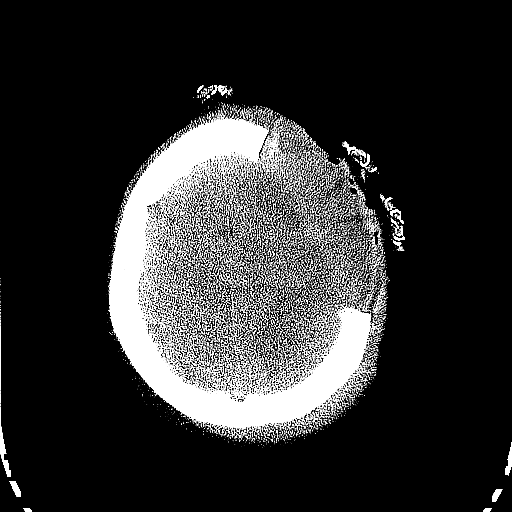
[im 70/78  brain]
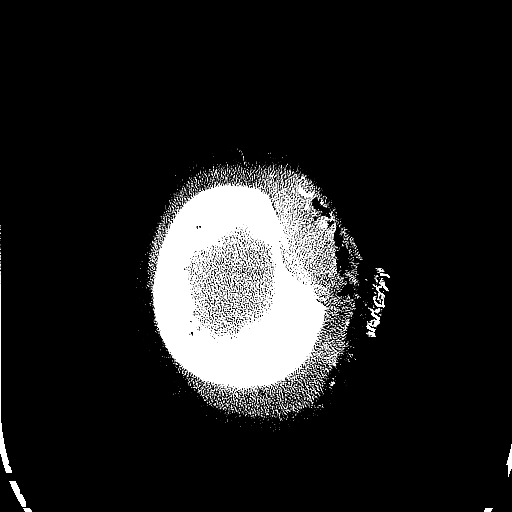

[Series 5: head 3.0 mpr cor · coronal · 0.34mm/px · 3 of 79 slices shown]
[im 27/79  brain]
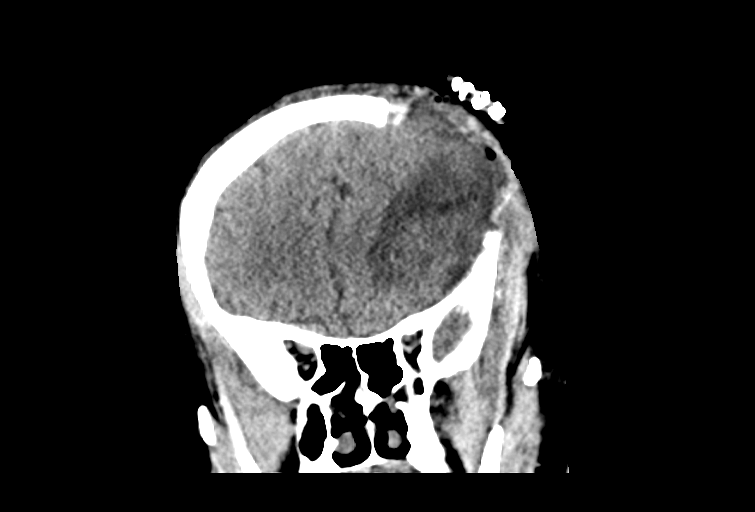
[im 35/79  brain]
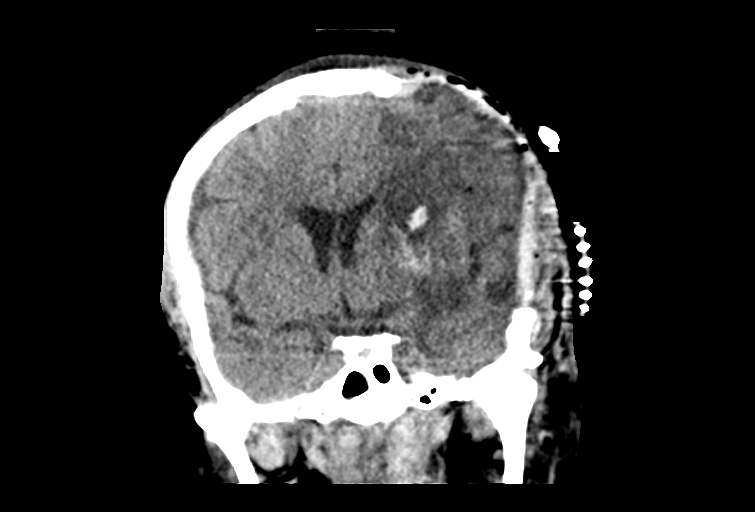
[im 44/79  brain]
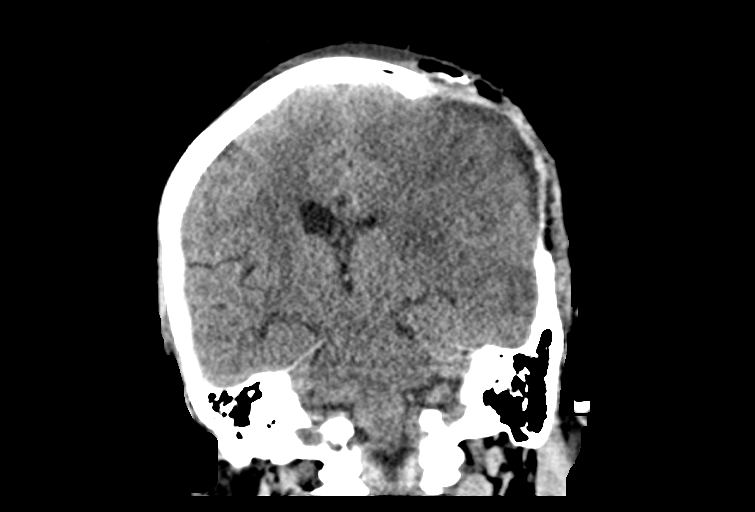

[Series 6: head 3.0 mpr sag · sagittal · 0.30mm/px · 3 of 67 slices shown]
[im 23/67  brain]
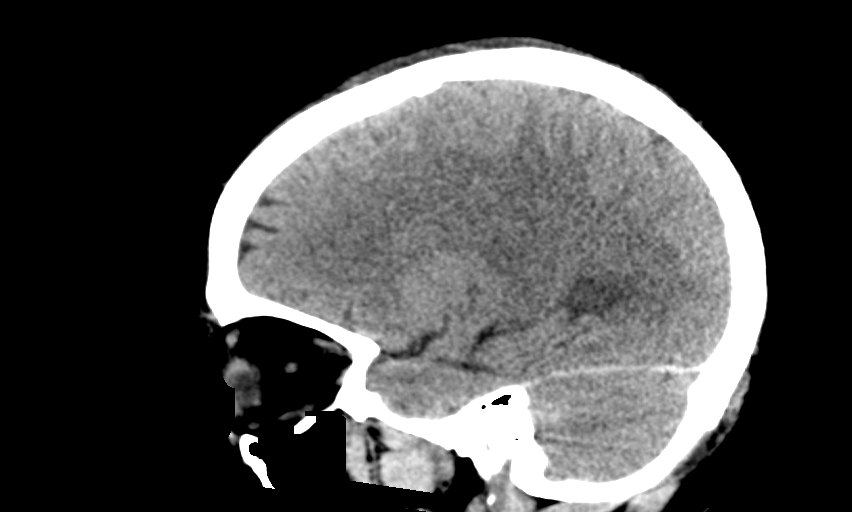
[im 34/67  brain]
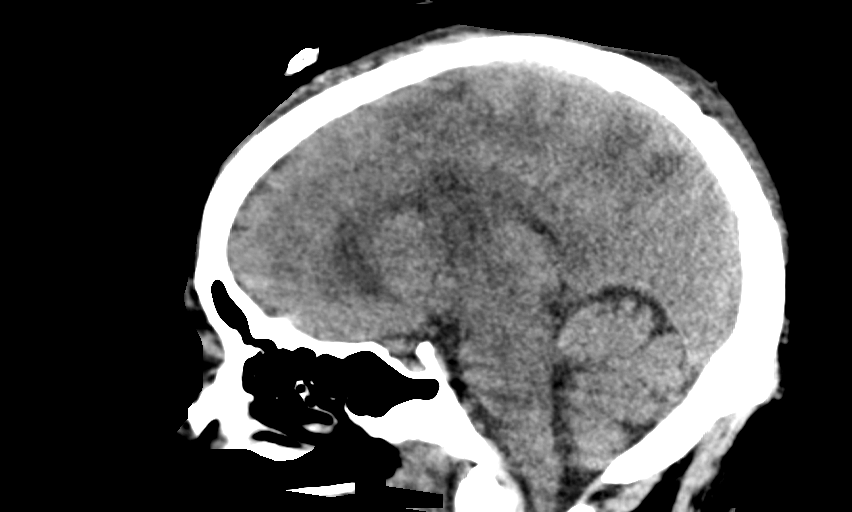
[im 45/67  brain]
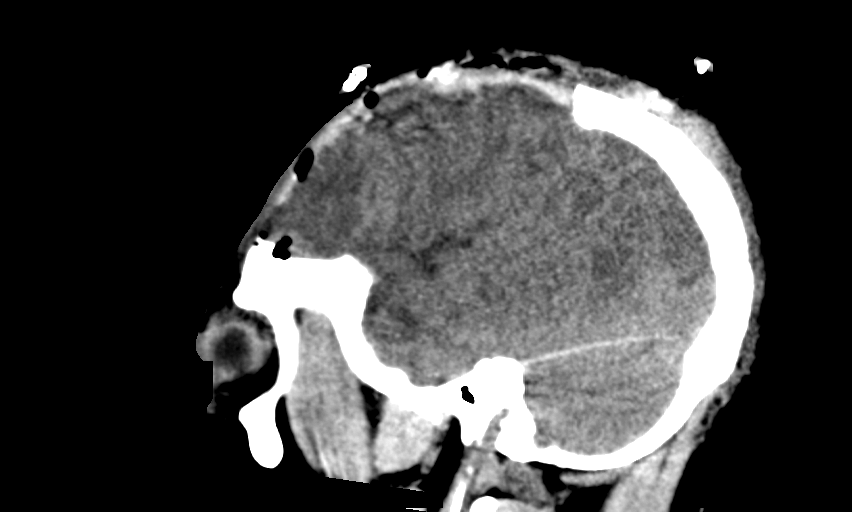

[14 of 47 positions shown; findings below may reference images not displayed]

FINDINGS: Brain: Large left MCA infarction is stable in distribution. Interval
left frontal craniectomy with partial herniation of the left frontal
lobe through the craniectomy defect. Interval resolution of uncal
herniation, improved patency of the left lateral ventricle, and 6 mm
left-to-right midline shift, previously 8 mm. Partial interval
dispersion of acute hemorrhage in the left basal ganglia post
partial evacuation. There is a subcentimeter density anterior to the
prior position of blood products possibly representing retracted
clot or interval hemorrhage (series 3, image 19), attention at
follow-up recommended.

Vascular: No hyperdense vessel or unexpected calcification.

Skull: Interval left frontal craniectomy with edema and several foci
of air in the overlying scalp as well as skin staples.

Sinuses/Orbits: Possible right sphenoid sinus opacification with
aerosolized secretions. Normal aeration of the mastoid air cells.

Other: None.
IMPRESSION: 1. Large left MCA infarct is stable in distribution.
2. Interval left frontal craniectomy with mild herniation of left
frontal lobe through the defect.
3. Resolved uncal herniation, improved patency of left lateral
ventricle, and 6 mm left-to-right midline shift, previously 8 mm.
4. Partial interval dispersion of acute hemorrhage in the left basal
ganglia. Subcentimeter density anterior to the prior position of
blood products possibly representing retracted clot or interval
hemorrhage, attention at follow-up recommended.

By: Abdhadi Btissam M.D.

## 2019-09-23 IMAGING — DX DG CHEST 1V PORT
1 series · 1 of 1 positions shown · non-contrast
Comparison: 11/29/2017

CLINICAL DATA: Respiratory failure

EXAM:
PORTABLE CHEST 1 VIEW

[chest]
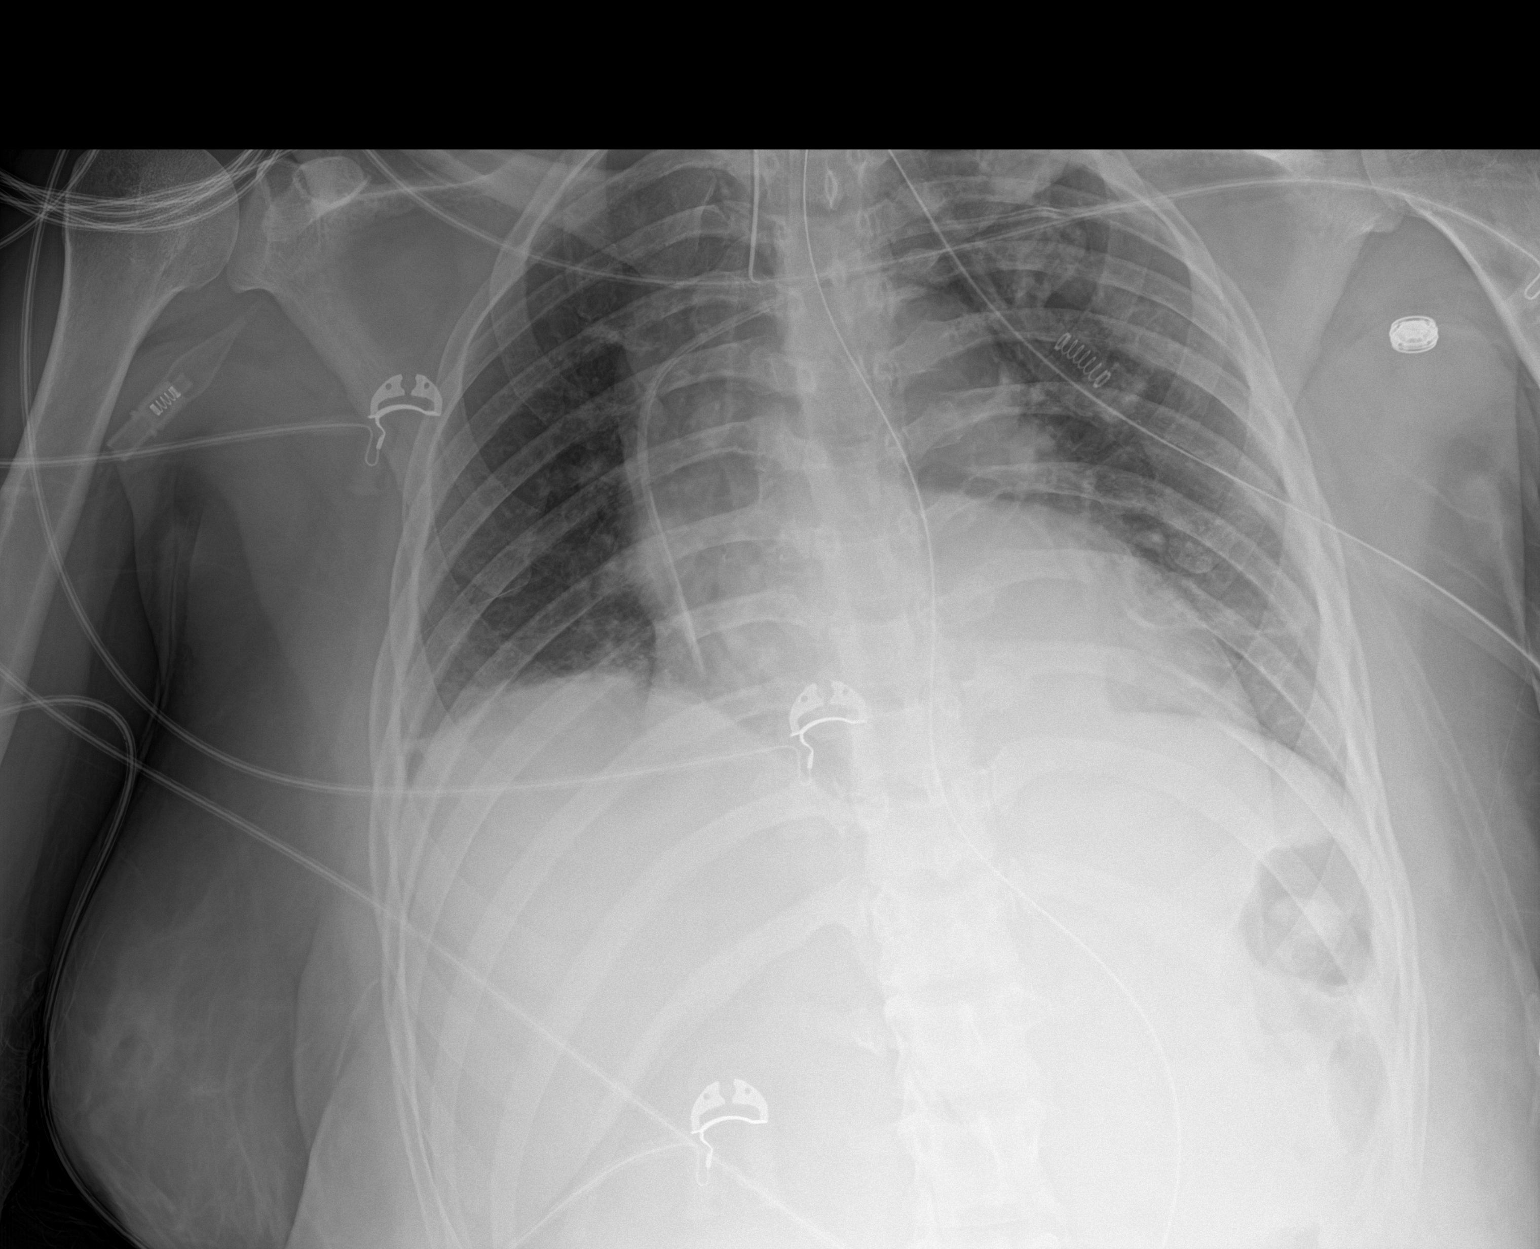

[1 of 1 positions shown; findings below may reference images not displayed]

FINDINGS: Lungs are essentially clear.  No pleural effusion or pneumothorax.

Cardiomegaly.

Endotracheal tube terminates 3 cm above the carina.

Left subclavian venous catheter terminates in the right atrium, 2 cm
below the cavoatrial junction.

Enteric tube courses into the stomach.
IMPRESSION: Endotracheal tube terminates 3 cm above the carina.

Left subclavian venous catheter terminates in the right atrium, 2 cm
below the cavoatrial junction.

## 2019-09-24 IMAGING — DX DG CHEST 1V PORT
1 series · 1 of 1 positions shown · non-contrast
Comparison: 11/30/2017

CLINICAL DATA: Check endotracheal tube placement

EXAM:
PORTABLE CHEST 1 VIEW

[chest ap]
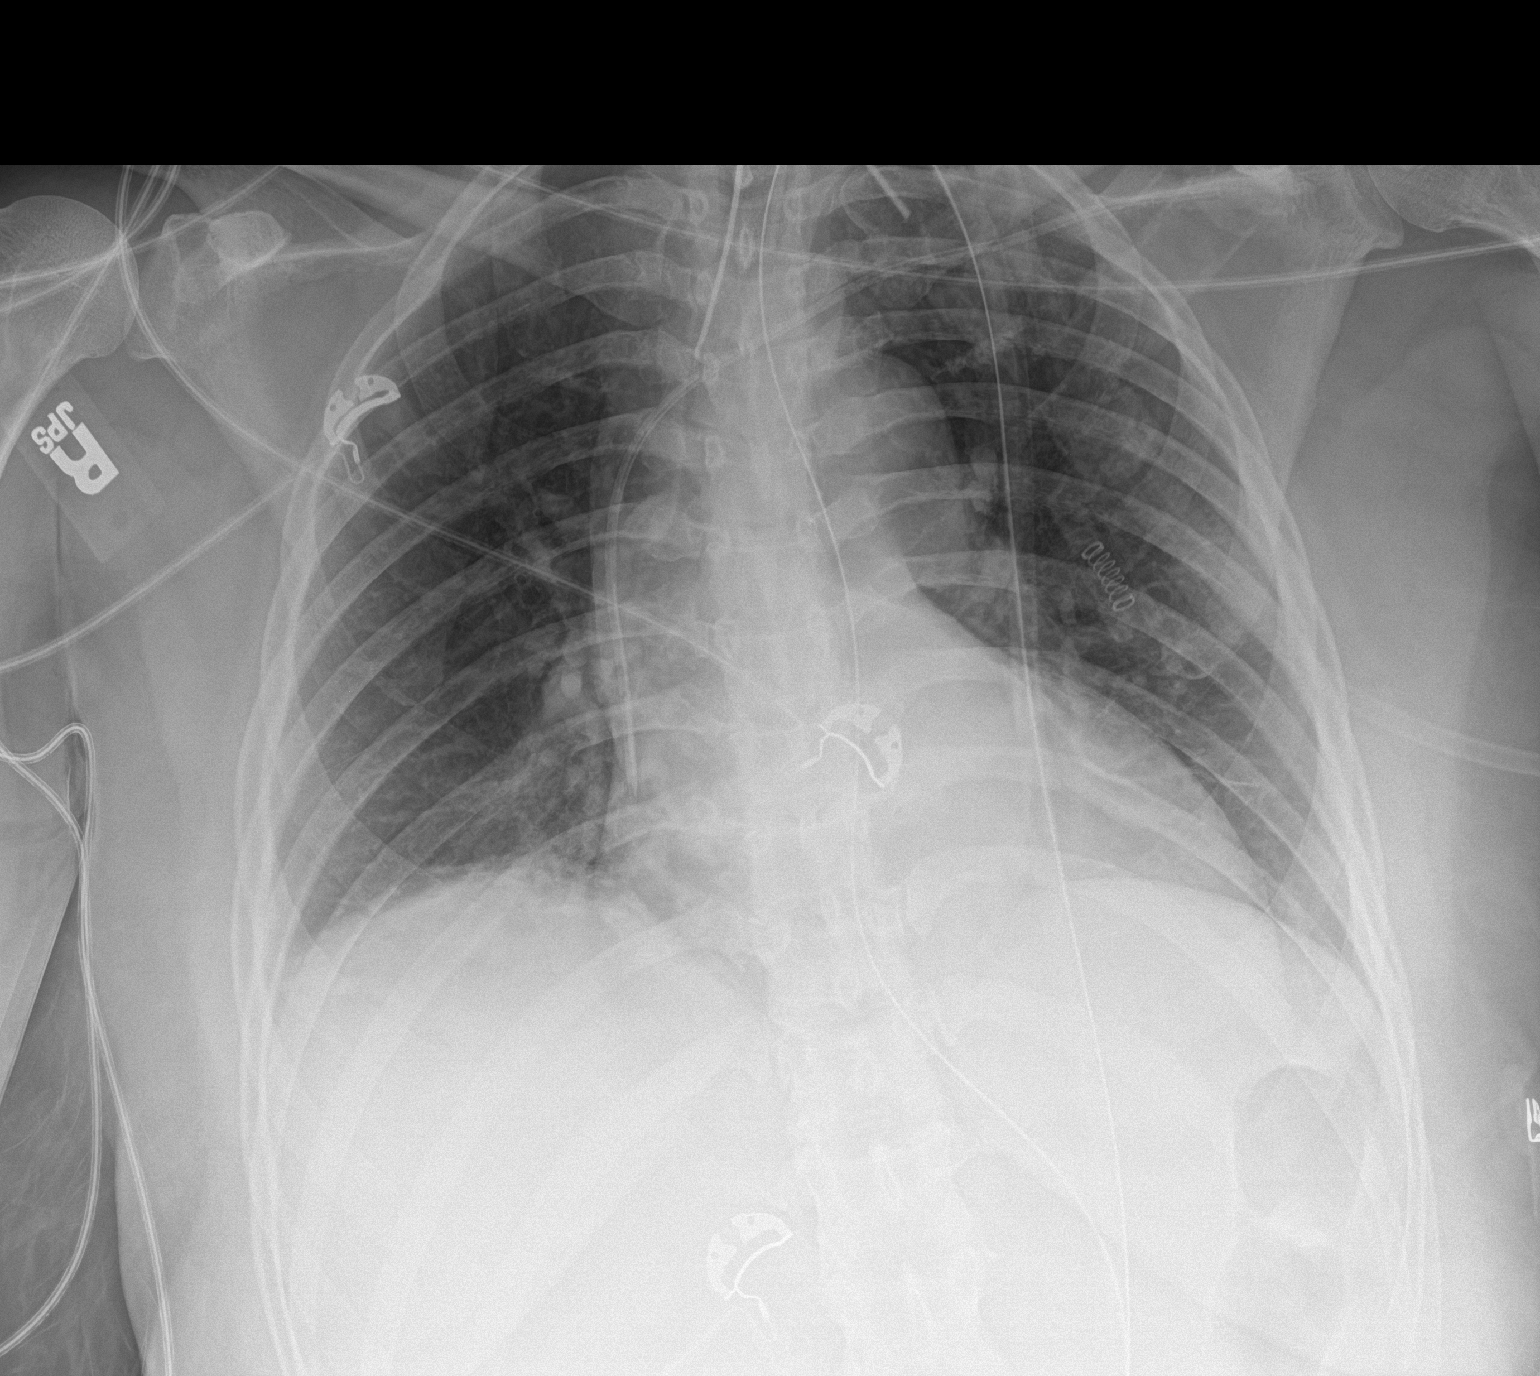

[1 of 1 positions shown; findings below may reference images not displayed]

FINDINGS: Cardiac shadow is prominent but stable. Endotracheal tube,
nasogastric catheter and left subclavian central line are again seen
and stable. Very mild right basilar atelectasis is again noted. Bony
structures are within normal limits.
IMPRESSION: Mild right basilar atelectasis.  No acute abnormality noted.

## 2019-10-28 ENCOUNTER — Encounter: Payer: Self-pay | Admitting: Adult Health

## 2019-10-28 ENCOUNTER — Other Ambulatory Visit: Payer: Self-pay

## 2019-10-28 ENCOUNTER — Ambulatory Visit: Payer: Medicaid Other | Admitting: Adult Health

## 2019-10-28 VITALS — BP 129/80 | HR 60 | Ht 64.0 in | Wt 210.2 lb

## 2019-10-28 DIAGNOSIS — I69320 Aphasia following cerebral infarction: Secondary | ICD-10-CM

## 2019-10-28 DIAGNOSIS — G8111 Spastic hemiplegia affecting right dominant side: Secondary | ICD-10-CM

## 2019-10-28 DIAGNOSIS — I10 Essential (primary) hypertension: Secondary | ICD-10-CM

## 2019-10-28 DIAGNOSIS — E785 Hyperlipidemia, unspecified: Secondary | ICD-10-CM

## 2019-10-28 DIAGNOSIS — I639 Cerebral infarction, unspecified: Secondary | ICD-10-CM

## 2019-10-28 DIAGNOSIS — I611 Nontraumatic intracerebral hemorrhage in hemisphere, cortical: Secondary | ICD-10-CM

## 2019-10-28 DIAGNOSIS — R569 Unspecified convulsions: Secondary | ICD-10-CM

## 2019-10-28 NOTE — Patient Instructions (Addendum)
Highly recommend doing therapy for residual deficits - if unable to obtain therapy at facility, may consider doing outpatient therapy based on insurance purposes   Continue keppra 1000mg  twice daily for seizure prevention  Continue aspirin 81 mg daily  and atorvastatin 20mg   for secondary stroke prevention  Continue to follow up with PCP regarding cholesterol and blood pressure management  Maintain strict control of hypertension with blood pressure goal below 130/90 and cholesterol with LDL cholesterol (bad cholesterol) goal below 70 mg/dL.      Followup in the future with me in 6 months or call earlier if needed       Thank you for coming to see at [facility]. I hope we have been able to provide you high quality care today.  You may receive a patient satisfaction survey over the next few weeks. We would appreciate your feedback and comments so that we may continue to improve ourselves and the health of our patients.

## 2019-10-28 NOTE — Progress Notes (Signed)
[facility] [address] [place]. [place] [address] [phone]       OFFICE FOLLOW UP NOTE  Tammie Miles Date of Birth:  Nov 19, 1975 Medical Record Number:  [id number]   Reason for visit: Left MCA infarct with hemorrhagic transformation   CHIEF COMPLAINT:  Chief Complaint  Patient presents with  . Follow-up    4 mo f/u, still having issues with right leg.   . room 5    with aunt     HPI:  Today, 10/28/2019, Tammie Miles returns for stroke follow-up accompanied by her aunt.  Stable from stroke standpoint with residual severe aphasia expressive> receptive and right spastic hemiparesis She is frustrated with her continued deficits and lack of therapy.  Primarily wheelchair-bound but is able to ambulate short distance Evaluated by ENT which showed mild subglottic stenosis but felt not impacting language difficulty Denies new or worsening stroke/TIA symptoms  Remains on Keppra 1000 mg twice daily without seizure activity.  Denies side effects  Continues on aspirin 81 mg daily without bleeding or bruising Continues on atorvastatin 20 mg daily without myalgias Blood pressure today 129/80 HTN and HLD managed by facility  Underwent right knee arthroscopy with meniscal debridement on 08/13/2019 Does report mild improvement of pain  No further concerns    History provided for reference purposes only Update 06/28/2019 JM: Tammie Miles returns for follow-up with history of stroke as well as discussion regarding undergoing left knee replacement procedure.  She is accompanied today by her aunt. She continues to reside at [facility] with residual stroke deficits of right hemiparesis and aphasia which have been stable with patient reporting some improvement.  She has not been participating in therapies which she is frustrated about.  Per aunt, therapy discontinued due to lack of staff.  History of chronic knee pain previously undergoing right knee arthroplasty and has  been having greater difficulty with left knee without benefit from injections.  Continues to follow with orthopedics and per aunt, concerns regarding recovery in setting of residual stroke deficits.  Ambulates short distance but limited due to residual right hemiparesis and L>R knee pain.  Continues on Keppra 1000 mg twice daily tolerating well without recurrent seizure activity.  She underwent cranioplasty on 01/22/2019 without complication.  Continues on aspirin 81 mg daily and atorvastatin 20 mg daily for secondary stroke prevention.  Blood pressure today 138/89.  No concerns at this time.  Update 11/26/2018 JM: Tammie Miles is being seen today for stroke follow-up accompanied by her aunt.  Prior follow-up appointments canceled/rescheduled due to COVID-19 safety restrictions and difficulty with scheduling virtual visit.  She continues to reside at [facility].  Residual stroke deficits of right hemiparesis and severe expressive > mild to moderate receptive aphasia.  She is ambulatory for very short distances and assistance needed.  Aunt states she was doing well with improvement of ambulation but this worsened after seizure activity in 05/2018 and lack of therapy.  She was admitted to ED on 05/20/2018 due to seizure activity but denies additional seizure since that time.  She continues on Keppra without side effects.  Referral placed at prior visit to follow-up with neurosurgery but unfortunately due to difficulty getting a hold of patient/facility, appointment was never made. Skullflap located right abdomen.  She continues on aspirin 81 mg daily for secondary stroke prevention without side effects.  Blood pressure today satisfactory at 135/89.  Denies new or worsening stroke/TIA symptoms.  Initial visit 03/05/2018 JM: Patient is being seen today for  hospital follow-up and is accompanied by her aunt.  She continues to reside at [facility].  She continues to receive  PT/OT/ST. she continues to have LUE flaccid and expressive > receptive aphasia.  Severe expressive aphasia present with only being able to say "no" but is able to nod her head appropriately.  Mild to moderate receptive aphasia present.  She has had PEG tube removed and is currently obtaining all nutrition and pills orally.  She denies any complication in doing so and denies any aspiration or coughing with eating.  She is able to ambulate without assistance and denies any recent falls.  She needs minimal assistance with ADLs such as bathing and dressing.  She does feel as though she has been improving.  Continues on aspirin 81 mg without side effects of bleeding or bruising.  She continues on Keppra without recurrent seizures and has been tolerating well.  Blood pressure today satisfactory 135/82.  She denies follow-up appointment with neurosurgery since hospital discharge as recommended.  She does have skullcap placed right side of abdomen.  No further concerns at this time.  Denies new or worsening stroke/TIA symptoms.  Stroke admission: Tammie Miles is a 44 y.o. Femalewith PMH of HTN on hypertensive medications, antidepressants, stimulants, and Xanax which are all prescribed by her primary physician.  Per discharge summary notes by Dr. Pearlean Brownie, after awakening from a nap on 11/26/2017 she was noted to not be acting correctly and was dropping things.  Her 78-year-old daughter called the father multiple times and when he went and checked on her he believes that she was drunk and she was obtunded but moving her arms.  The following morning on 11/27/2017, she remained obtunded and the husband called 911 where she was brought into the emergency room via EMS.  Initial CT head reviewed and showed large area of hemorrhagic left basal ganglia and left frontal lobe was noted with a 5.8 x 3.5 cm along with 15 mm shift from the left to right with early herniation.  Neurosurgery was immediately called for decompression.   Initial hematoma evacuation performed and after neuro worsening, hemicraniectomy was performed a few days later.  Initial insult felt to be ischemic with large hemorrhagic transformation and continued bleeding.  Unfortunately, patient had a prolonged hospitalization due to lack of family support and insurance for rehab.  Patient was started on Keppra for seizure prevention after episode of stiffness and tongue biting on 12/03/2017.  EEG did not show seizure activity.  Due to acute respiratory insufficiency, patient was intubated and did undergo tracheostomy on 12/09/2017.  Blood pressure initially found to be elevated on admission but stabilized throughout hospitalization.  Due to continued dysphasia, PEG tube placed on 12/09/2017 for nutritional support.  Aspirin 81 mg initiated for secondary stroke prevention.  Therapies recommended SNF but due to lack of bed placement, patient was transferred to CIR for ongoing PT/OT/ST.  During CIR stay, patient was decannulated on 01/09/2018.  Due to continued therapy needs, patient was discharged to [facility] on 01/20/2018.      ROS:   14 system review of systems performed and negative with exception of speech difficulty, weakness, joint pain and gait impairment  PMH:  Past Medical History:  Diagnosis Date  . Acute posthemorrhagic anemia    hx of   . ADHD   . Anxiety   . Aphasia   . Aphasia following unspecified cerebrovascular disease   . Arthritis   . Depression   .  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HCC)   . Hemiplegia affecting right nondominant side (HCC)   . Hypertension   . Internal derangement of left knee   . Seizures (HCC)   . Stroke [facility])    cerebral infarction due to occlusion or stenosis of left middle cerebral artery     PSH:  Past Surgical History:  Procedure Laterality Date  . CRANIOPLASTY Left 01/22/2019   Procedure: CRANIOPLASTY;  Surgeon: Lisbeth Renshaw, MD;  Location: [facility] OR;  Service: Neurosurgery;   Laterality: Left;  CRANIOPLASTY  . CRANIOTOMY Left 11/27/2017   Procedure: CRANIOTOMY for Evacuation of Intracranial Hemorrhage;  Surgeon: Lisbeth Renshaw, MD;  Location: [facility] OR;  Service: Neurosurgery;  Laterality: Left;  CRANIOTOMY for Evacuation of Intracranial Hemorrhage  . CRANIOTOMY Left 11/28/2017   Procedure: Decompressive craniectomy;  Surgeon: Maeola Harman, MD;  Location: [facility] OR;  Service: Neurosurgery;  Laterality: Left;  . ESOPHAGOGASTRODUODENOSCOPY N/A 12/09/2017   Procedure: ESOPHAGOGASTRODUODENOSCOPY (EGD);  Surgeon: Violeta Gelinas, MD;  Location: [facility] ENDOSCOPY;  Service: General;  Laterality: N/A;  . KNEE ARTHROSCOPY WITH MENISCAL REPAIR Right 08/13/2019   Procedure: KNEE ARTHROSCOPY WITH PARTIAL MENISECTOMY, CHONDRAPLASTY MEDIAL LATERAL, PATELLA-FEMORAL;  Surgeon: Jodi Geralds, MD;  Location: [facility] ORS;  Service: Orthopedics;  Laterality: Right;  . KNEE SURGERY Bilateral   . PEG PLACEMENT N/A 12/09/2017   Procedure: PERCUTANEOUS ENDOSCOPIC GASTROSTOMY (PEG) PLACEMENT;  Surgeon: Violeta Gelinas, MD;  Location: [facility] ENDOSCOPY;  Service: General;  Laterality: N/A;    Social History:  Social History   Socioeconomic History  . Marital status: Married    Spouse name: Not on file  . Number of children: 1  . Years of education: Not on file  . Highest education level: Not on file  Occupational History  . Not on file  Tobacco Use  . Smoking status: Never Smoker  . Smokeless tobacco: Never Used  Vaping Use  . Vaping Use: Never used  Substance and Sexual Activity  . Alcohol use: Not Currently  . Drug use: Not Currently  . Sexual activity: Yes  Other Topics Concern  . Not on file  Social History Narrative  . Not on file   Social Determinants of Health   Financial Resource Strain:   . Difficulty of Paying Living Expenses: Not on file  Food Insecurity:   . Worried About Programme researcher, broadcasting/film/video in the Last Year: Not on file  . Ran Out of Food in the Last Year: Not on file   Transportation Needs:   . Lack of Transportation (Medical): Not on file  . Lack of Transportation (Non-Medical): Not on file  Physical Activity:   . Days of Exercise per Week: Not on file  . Minutes of Exercise per Session: Not on file  Stress:   . Feeling of Stress : Not on file  Social Connections:   . Frequency of Communication with Friends and Family: Not on file  . Frequency of Social Gatherings with Friends and Family: Not on file  . Attends Religious Services: Not on file  . Active Member of Clubs or Organizations: Not on file  . Attends Banker Meetings: Not on file  . Marital Status: Not on file  Intimate Partner Violence:   . Fear of Current or Ex-Partner: Not on file  . Emotionally Abused: Not on file  . Physically Abused: Not on file  . Sexually Abused: Not on file    Family History:  Family History  Problem Relation Age of Onset  . Hypertension Mother   .  Hypertension Father     Medications:   Current Outpatient Medications on File Prior to Visit  Medication Sig Dispense Refill  . acetaminophen (TYLENOL) 500 MG tablet Take 1,000 mg by mouth 3 (three) times daily.    Marland Kitchen. acetaminophen-codeine (TYLENOL #3) 300-30 MG tablet Take 1 tablet by mouth 2 (two) times daily as needed for moderate pain.    Marland Kitchen. amLODipine (NORVASC) 10 MG tablet Take 10 mg by mouth daily.    Marland Kitchen. aspirin 81 MG chewable tablet Place 1 tablet (81 mg total) into feeding tube daily. (Patient taking differently: Chew 81 mg by mouth daily. )    . atenolol (TENORMIN) 100 MG tablet Take 100 mg by mouth at bedtime.    Marland Kitchen. atorvastatin (LIPITOR) 40 MG tablet Take 40 mg by mouth at bedtime.    . Azelastine HCl 137 MCG/SPRAY SOLN Place 1 spray into the nose daily.    Marland Kitchen. b complex vitamins tablet Take 1 tablet by mouth daily.    . baclofen (LIORESAL) 10 MG tablet Take 10 mg by mouth at bedtime.    . cetirizine (ZYRTEC) 5 MG tablet Take 5 mg by mouth daily.    . citalopram (CELEXA) 10 MG tablet Take  10 mg by mouth at bedtime.    . GUAIFENESIN PO Take by mouth.    . levETIRAcetam (KEPPRA) 1000 MG tablet Take 1,000 mg by mouth every 12 (twelve) hours.    . lidocaine (LMX) 4 % cream Apply 1 application topically See admin instructions. Apply to old trach site once daily, may use an additional 2 times daily as needed for pain at old trach site    . MELATONIN PO Take 8 mg by mouth at bedtime.     . naproxen sodium (ALEVE) 220 MG tablet Take 220 mg by mouth 2 (two) times daily as needed (pain).    . polyethylene glycol (MIRALAX / GLYCOLAX) 17 g packet Take 17 g by mouth daily as needed for moderate constipation. Mix in 4-8 oz liquid and drink    . potassium chloride SA (KLOR-CON) 20 MEQ tablet Take 20 mEq by mouth every Monday, Wednesday, and Friday.    . sennosides-docusate sodium (SENOKOT-S) 8.6-50 MG tablet Take 2 tablets by mouth daily as needed for constipation.    . traZODone (DESYREL) 100 MG tablet Take 100 mg by mouth at bedtime.     No current facility-administered medications on file prior to visit.    Allergies:  No Known Allergies   Physical Exam  Vitals:   10/28/19 0828  BP: 129/80  Pulse: 60  Weight: 210 lb 3.2 oz (95.3 kg)  Height: 5\' 4"  (1.626 m)   Body mass index is 36.08 kg/m. No exam data present  General: well developed, well nourished, pleasant African-American female, seated, in no evident distress Neck: supple with no carotid or supraclavicular bruits Cardiovascular: regular rate and rhythm, no murmurs Vascular:  Normal pulses all extremities  Neurologic Exam Mental Status: Awake and fully alert. Severe expressive aphasia with mild receptive aphasia.  No evidence of cognitive difficulties.  Mood and affect appropriate and cooperative with exam Cranial Nerves: Pupils equal, briskly reactive to light. Extraocular movements full without nystagmus. Visual fields blink to threat bilaterally.  Hearing intact. Facial sensation intact.  Right lower facial  weakness Motor: Full strength left upper and lower extremity RUE: 1/5 with spasticity greater in deltoid and fingers RLE: 4/5 hip flexor and ankle dorsiflexion weakness. 5/5 knee extension and flexion Sensory.: intact to touch , pinprick ,  position and vibratory sensation.  Coordination: Rapid alternating movements normal except RUE. Finger-to-nose and heel-to-shin performed accurately on left side Gait and Station: Arises from chair without difficulty. Stance is normal. Gait demonstrates hemiplegic gait and able to take a couple steps without assistance Reflexes: 2+ right side and 1+ right side. Toes downgoing.       ASSESSMENT: Tammie Miles is a 44 y.o. 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  Cranioplasty on 01/22/2019 without complication.  Initial insult felt to be ischemic with large hemorrhagic transformation and continued bleeding.  Hospitalization complicated by acute respiratory insufficiency status post trach with decannulation, seizure, hypertensive emergency, anemia, and dysphasia status post PEG. Vascular risk factors include HTN and depression.  Recurrent seizure activity 05/2018 currently on Keppra 1000 mg twice daily controlled.      PLAN:  1. Left MCA infarct with hemorrhagic transformation:  a. Residual deficits: i. Severe expressive aphasia, mild receptive aphasia and right spastic hemiparesis.  ii. Highly recommend reinitiating therapy for possible further improvement.   iii. Advised aunt to discuss further with facility in regards to possible approval for outpatient therapy b. Continue aspirin 81 mg daily and atorvastatin 20 mg daily for secondary stroke prevention c. Ensure close PCP follow-up for aggressive stroke risk factor management 2. Seizure,  onset of stroke:  a. No recurrent events.   b. Continuation of Keppra 1000 mg twice daily for seizure prophylaxis 3. HTN:  a. BP goal<130/90.  Stable.  b.  Managed by PCP/facility 4. HLD:  a. LDL goal<70.   b. Continue atorvastatin 20 mg daily.   c. Managed by PCP/facility   Follow up in 6 months or call earlier if needed   I spent 30 minutes of face-to-face and non-face-to-face time with patient and aunt.  This included previsit chart review, lab review, study review, order entry, electronic health record documentation, patient education regarding prior stroke, importance of managing stroke risk factors, residual deficits, post stroke seizure, possible further therapy sessions and answered all questions to patient and aunts satisfaction   Ihor Austin, AGNP-BC  [facility] [address] [place], [place] [address]  Phone [phone] Fax [phone] Note: This document was prepared with digital dictation and possible smart phrase technology. Any transcriptional errors that result from this process are unintentional.

## 2019-10-29 NOTE — Progress Notes (Signed)
I agree with the above plan 

## 2020-03-01 ENCOUNTER — Other Ambulatory Visit: Payer: Self-pay | Admitting: Anesthesiology

## 2020-03-01 ENCOUNTER — Other Ambulatory Visit: Payer: Self-pay | Admitting: Family Medicine

## 2020-03-01 DIAGNOSIS — N644 Mastodynia: Secondary | ICD-10-CM

## 2020-04-11 ENCOUNTER — Ambulatory Visit
Admission: RE | Admit: 2020-04-11 | Discharge: 2020-04-11 | Disposition: A | Discharge status: Home / Self Care | Payer: Medicaid Other | Source: Ambulatory Visit | Attending: Family Medicine | Admitting: Family Medicine

## 2020-04-11 ENCOUNTER — Other Ambulatory Visit: Payer: Self-pay

## 2020-04-11 ENCOUNTER — Ambulatory Visit: Admission: RE | Admit: 2020-04-11 | Payer: Medicaid Other | Source: Ambulatory Visit

## 2020-04-11 DIAGNOSIS — N644 Mastodynia: Secondary | ICD-10-CM

## 2020-05-01 ENCOUNTER — Ambulatory Visit (INDEPENDENT_AMBULATORY_CARE_PROVIDER_SITE_OTHER): Payer: Medicaid Other | Admitting: Adult Health

## 2020-05-01 ENCOUNTER — Other Ambulatory Visit: Payer: Self-pay

## 2020-05-01 ENCOUNTER — Encounter: Payer: Self-pay | Admitting: Adult Health

## 2020-05-01 VITALS — BP 126/88 | HR 68 | Ht 63.0 in | Wt 218.0 lb

## 2020-05-01 DIAGNOSIS — I611 Nontraumatic intracerebral hemorrhage in hemisphere, cortical: Secondary | ICD-10-CM

## 2020-05-01 DIAGNOSIS — I639 Cerebral infarction, unspecified: Secondary | ICD-10-CM | POA: Diagnosis not present

## 2020-05-01 DIAGNOSIS — R569 Unspecified convulsions: Secondary | ICD-10-CM | POA: Diagnosis not present

## 2020-05-01 NOTE — Progress Notes (Signed)
[facility] [address] [place]. [place] [address] [phone]       OFFICE FOLLOW UP NOTE  [name] Date of Birth:  05-22-1975 Medical Record Number:  [id number]   Reason for visit: Left MCA infarct with hemorrhagic transformation   CHIEF COMPLAINT:  Chief Complaint  Patient presents with  . Follow-up    Rm 14 alone Pt is unable to speak, having some Right side weakness, unable to use R hand, some headaches.     HPI:  Today, 05/01/2020, [name] returns for 17-monthstroke follow-up unaccompanied.  [facility]  Residual aphasia and right spastic hemiparesis -denies worsening Able to ambulate short distance otherwise primarily wheelchair-bound Continues to experience bilateral knee pain which interferes with ambulation but is able to ambulate short distance with rolling walker Denies new stroke/TIA symptoms  Remains on Keppra 1000 mg twice daily -denies side effects Denies recent seizure activity  Reports compliance on aspirin 81 mg daily and atorvastatin 20 mg daily -denies side effects Blood pressure today initially elevated at 153/102 and on recheck 126/88 No concerns at this time     History provided for reference purposes only Update 10/28/2019 Tammie Miles: Ms. JHeldermanreturns for stroke follow-up accompanied by her aunt. Stable from stroke standpoint with residual severe aphasia expressive> receptive and right spastic hemiparesis She is frustrated with her continued deficits and lack of therapy.  Primarily wheelchair-bound but is able to ambulate short distance Evaluated by ENT which showed mild subglottic stenosis but felt not impacting language difficulty Denies new or worsening stroke/TIA symptoms Remains on Keppra 1000 mg twice daily without seizure activity.  Denies side effects Continues on aspirin 81 mg daily without bleeding or bruising Continues on atorvastatin 20 mg daily  without myalgias Blood pressure today 129/80 HTN and HLD managed by facility Underwent right knee arthroscopy with meniscal debridement on 08/13/2019 Does report mild improvement of pain No further concerns  Update 06/28/2019 Tammie Miles: Ms. JMelicharreturns for follow-up with history of stroke as well as discussion regarding undergoing left knee replacement procedure.  She is accompanied today by her aunt. [facility] of [facility] hem[facility]ares[facility] and aphasia which have been stable with patient reporting some improvement.  She has not been participating in therapies which she is frustrated about.  Per aunt, therapy discontinued due to lack of staff.  History of chronic knee pain previously undergoing right knee arthroplasty and has been having greater difficulty with left knee without benefit from injections.  Continues to follow with orthopedics and per aunt, concerns regarding recovery in setting of residual stroke deficits.  Ambulates short distance but limited due to residual right hemiparesis and L>R knee pain.  Continues on Keppra 1000 mg twice daily tolerating well without recurrent seizure activity.  She underwent cranioplasty on 148/27/0786without complication.  Continues on aspirin 81 mg daily and atorvastatin 20 mg daily for secondary stroke prevention.  Blood pressure today 138/89.  No concerns at this time.  Update 11/26/2018 Tammie Miles: Ms. JWynetta Emeryis being seen today for stroke follow-up accompanied by her aunt.  Prior follow-up appointments canceled/rescheduled due to COVID-19 safety restrictions and difficulty with scheduling virtual visit.  [facility]  Residual stroke deficits of right hemiparesis and severe expressive > mild to moderate receptive aphasia.  She is ambulatory for very short distances and assistance needed.  Aunt states she was doing well with improvement of  ambulation but this worsened after seizure  activity in 05/2018 and lack of therapy.  She was admitted to ED on 05/20/2018 due to seizure activity but denies additional seizure since that time.  She continues on Keppra without side effects.  Referral placed at prior visit to follow-up with neurosurgery but unfortunately due to difficulty getting a hold of patient/facility, appointment was never made. Skullflap located right abdomen.  She continues on aspirin 81 mg daily for secondary stroke prevention without side effects.  Blood pressure today satisfactory at 135/89.  Denies new or worsening stroke/TIA symptoms.  Initial visit 03/05/2018 Tammie Miles: Patient is being seen today for hospital follow-up and is accompanied by her aunt.  She continues to reside at [facility] and rehabilitation center.  She continues to receive PT/OT/ST. she continues to have LUE flaccid and expressive > receptive aphasia.  Severe expressive aphasia present with only being able to say "no" but is able to nod her head appropriately.  Mild to moderate receptive aphasia present.  She has had PEG tube removed and is currently obtaining all nutrition and pills orally.  She denies any complication in doing so and denies any aspiration or coughing with eating.  She is able to ambulate without assistance and denies any recent falls.  She needs minimal assistance with ADLs such as bathing and dressing.  She does feel as though she has been improving.  Continues on aspirin 81 mg without side effects of bleeding or bruising.  She continues on Keppra without recurrent seizures and has been tolerating well.  Blood pressure today satisfactory 135/82.  She denies follow-up appointment with neurosurgery since hospital discharge as recommended.  She does have skullcap placed right side of abdomen.  No further concerns at this time.  Denies new or worsening stroke/TIA symptoms.  Stroke admission: Tammie Miles is a 45 y.o. Femalewith PMH of HTN on hypertensive medications, antidepressants,  stimulants, and Xanax which are all prescribed by her primary physician.  Per discharge summary notes by Dr. Leonie Man, after awakening from a nap on 11/26/2017 she was noted to not be acting correctly and was dropping things.  Her 3-year-old daughter called the father multiple times and when he went and checked on her he believes that she was drunk and she was obtunded but moving her arms.  The following morning on 11/27/2017, she remained obtunded and the husband called 911 where she was brought into the emergency room via EMS.  Initial CT head reviewed and showed large area of hemorrhagic left basal ganglia and left frontal lobe was noted with a 5.8 x 3.5 cm along with 15 mm shift from the left to right with early herniation.  Neurosurgery was immediately called for decompression.  Initial hematoma evacuation performed and after neuro worsening, hemicraniectomy was performed a few days later.  Initial insult felt to be ischemic with large hemorrhagic transformation and continued bleeding.  Unfortunately, patient had a prolonged hospitalization due to lack of family support and insurance for rehab.  Patient was started on Keppra for seizure prevention after episode of stiffness and tongue biting on 12/03/2017.  EEG did not show seizure activity.  Due to acute respiratory insufficiency, patient was intubated and did undergo tracheostomy on 12/09/2017.  Blood pressure initially found to be elevated on admission but stabilized throughout hospitalization.  Due to continued dysphasia, PEG tube placed on 12/09/2017 for nutritional support.  Aspirin 81 mg initiated for secondary stroke prevention.  Therapies recommended SNF but due to lack of bed placement, patient  was transferred to CIR for ongoing PT/OT/ST.  During CIR stay, patient was decannulated on 01/09/2018.  Due to continued therapy needs, patient was discharged to SNF [facility] rehabilitation center on 01/20/2018.      ROS:   N/A d/t language  barrier  PMH:  Past Medical History:  Diagnosis Date  . Acute posthemorrhagic anemia    hx of   . ADHD   . Anxiety   . Aphasia   . Aphasia following unspecified cerebrovascular disease   . Arthritis   . Depression   . 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 Hemiplegia affecting right nondominant side ([place])   . Hypertension   . Internal derangement of left knee   . Seizures ([place])   . Stroke [facility])    cerebral infarction due to occlusion or stenosis of left middle cerebral artery     PSH:  Past Surgical History:  Procedure Laterality Date  . CRANIOPLASTY Left 01/22/2019   Procedure: CRANIOPLASTY;  Surgeon: Consuella Lose, MD;  Location: [facility];  Service: Neurosurgery;  Laterality: Left;  CRANIOPLASTY  . CRANIOTOMY Left 11/27/2017   Procedure: CRANIOTOMY for Evacuation of Intracranial Hemorrhage;  Surgeon: Consuella Lose, MD;  Location: [place];  Service: Neurosurgery;  Laterality: Left;  CRANIOTOMY for Evacuation of Intracranial Hemorrhage  . CRANIOTOMY Left 11/28/2017   Procedure: Decompressive craniectomy;  Surgeon: Erline Levine, MD;  Location: [facility];  Service: Neurosurgery;  Laterality: Left;  . ESOPHAGOGASTRODUODENOSCOPY N/A 12/09/2017   Procedure: ESOPHAGOGASTRODUODENOSCOPY (EGD);  Surgeon: Georganna Skeans, MD;  Location: [facility] ENDOSCOPY;  Service: General;  Laterality: N/A;  . KNEE ARTHROSCOPY WITH MENISCAL REPAIR Right 08/13/2019   Procedure: KNEE ARTHROSCOPY WITH PARTIAL MENISECTOMY, CHONDRAPLASTY MEDIAL LATERAL, PATELLA-FEMORAL;  Surgeon: Dorna Leitz, MD;  Location: [facility];  Service: Orthopedics;  Laterality: Right;  . KNEE SURGERY Bilateral   . PEG PLACEMENT N/A 12/09/2017   Procedure: PERCUTANEOUS ENDOSCOPIC GASTROSTOMY (PEG) PLACEMENT;  Surgeon: Georganna Skeans, MD;  Location: [facility] ENDOSCOPY;  Service: General;  Laterality: N/A;    Social History:  Social History   Socioeconomic History  . Marital status: Married    Spouse name: Not on file  . Number of children: 1  . Years  of education: Not on file  . Highest education level: Not on file  Occupational History  . Not on file  Tobacco Use  . Smoking status: Never Smoker  . Smokeless tobacco: Never Used  Vaping Use  . Vaping Use: Never used  Substance and Sexual Activity  . Alcohol use: Not Currently  . Drug use: Not Currently  . Sexual activity: Yes  Other Topics Concern  . Not on file  Social History Narrative  . Not on file   Social Determinants of Health   Financial Resource Strain: Not on file  Food Insecurity: Not on file  Transportation Needs: Not on file  Physical Activity: Not on file  Stress: Not on file  Social Connections: Not on file  Intimate Partner Violence: Not on file    Family History:  Family History  Problem Relation Age of Onset  . Hypertension Mother   . Hypertension Father   . Breast cancer Maternal Aunt     Medications:   Current Outpatient Medications on File Prior to Visit  Medication Sig Dispense Refill  . acetaminophen (TYLENOL) 500 MG tablet Take 1,000 mg by mouth 3 (three) times daily.    Marland Kitchen acetaminophen-codeine (TYLENOL #3) 300-30 MG tablet Take 1 tablet by mouth 2 (two) times daily as needed for  moderate pain.    Marland Kitchen amLODipine (NORVASC) 10 MG tablet Take 10 mg by mouth daily.    Marland Kitchen aspirin 81 MG chewable tablet Place 1 tablet (81 mg total) into feeding tube daily. (Patient taking differently: Chew 81 mg by mouth daily.)    . atenolol (TENORMIN) 100 MG tablet Take 100 mg by mouth at bedtime.    Marland Kitchen atorvastatin (LIPITOR) 40 MG tablet Take 40 mg by mouth at bedtime.    . Azelastine HCl 137 MCG/SPRAY SOLN Place 1 spray into the nose daily.    Marland Kitchen b complex vitamins tablet Take 1 tablet by mouth daily.    . baclofen (LIORESAL) 10 MG tablet Take 10 mg by mouth in the morning and at bedtime.    . cetirizine (ZYRTEC) 5 MG tablet Take 5 mg by mouth daily.    . citalopram (CELEXA) 10 MG tablet Take 10 mg by mouth at bedtime.    . GUAIFENESIN PO Take by mouth.    .  levETIRAcetam (KEPPRA) 1000 MG tablet Take 1,000 mg by mouth every 12 (twelve) hours.    . lidocaine (LMX) 4 % cream Apply 1 application topically See admin instructions. Apply to old trach site once daily, may use an additional 2 times daily as needed for pain at old trach site    . MELATONIN PO Take 8 mg by mouth at bedtime.     . naproxen sodium (ALEVE) 220 MG tablet Take 220 mg by mouth 2 (two) times daily as needed (pain).    . polyethylene glycol (MIRALAX / GLYCOLAX) 17 g packet Take 17 g by mouth daily as needed for moderate constipation. Mix in 4-8 oz liquid and drink    . potassium chloride SA (KLOR-CON) 20 MEQ tablet Take 20 mEq by mouth every Monday, Wednesday, and Friday.    . sennosides-docusate sodium (SENOKOT-S) 8.6-50 MG tablet Take 2 tablets by mouth daily as needed for constipation.    . traZODone (DESYREL) 100 MG tablet Take 100 mg by mouth at bedtime.     No current facility-administered medications on file prior to visit.    Allergies:  No Known Allergies   Physical Exam  Vitals:   05/01/20 0823  BP: (!) 153/102  Pulse: 68  Weight: 218 lb (98.9 kg)  Height: _0  (1.6 m)   Body mass index is 38.62 kg/m. No exam data present  General: well developed, well nourished, pleasant African-American female, seated, in no evident distress Neck: supple with no carotid or supraclavicular bruits Cardiovascular: regular rate and rhythm, no murmurs Vascular:  Normal pulses all extremities  Neurologic Exam Mental Status: Awake and fully alert. Severe expressive aphasia with mild receptive aphasia.  No evidence of cognitive difficulties.  Able to say yes and no appropriately.  Mood and affect appropriate and cooperative with exam Cranial Nerves: Pupils equal, briskly reactive to light. Extraocular movements full without nystagmus. Visual fields blink to threat bilaterally.  Hearing intact. Facial sensation intact.  Right lower facial weakness Motor: Full strength left upper  and lower extremity RUE: 1/5 with spasticity greater in deltoid and fingers RLE: 3/5 ankle dorsiflexion weakness. 5/5 hip flexor, knee extension and flexion Sensory.: intact to touch , pinprick , position and vibratory sensation.  Coordination: Rapid alternating movements normal except RUE. Finger-to-nose and heel-to-shin performed accurately on left side Gait and Station: Deferred Reflexes: 2+ right side and 1+ right side. Toes downgoing.       ASSESSMENT: GRAYSEN DEPAULA is a 45 y.o. year old  female here with large left MCA infarct with large hemorrhagic transformation with uncal herniation status post hemicraniotomy for evacuation of hematoma and increased ICP due to left MCA occlusion with embolic pattern without clear source on 11/27/2017.  Cranioplasty on 76/81/1572 without complication.  Initial insult felt to be ischemic with large hemorrhagic transformation and continued bleeding.  Hospitalization complicated by acute respiratory insufficiency status post trach with decannulation, seizure, hypertensive emergency, anemia, and dysphasia status post PEG. Vascular risk factors include HTN and depression.  Prior seizure activity 05/2018 currently on Keppra 1000 mg twice daily controlled.      PLAN:  1. Left MCA infarct with hemorrhagic transformation:  a. Residual deficits: i. Severe expressive aphasia, mild receptive aphasia and right spastic hemiparesis - stable  ii. May possibly benefit from additional therapy in order to maintain current level of functioning b. Continue aspirin 81 mg daily and atorvastatin 20 mg daily for secondary stroke prevention c. Discussed secondary stroke prevention measures and importance of close PCP follow-up for aggressive stroke risk factor management d. HTN: BP goal<130/90.  Stable. Managed by PCP/facility e. HLD: LDL goal<70.  On atorvastatin 20 mg daily.  Managed by PCP/facility 2. Seizure, onset of stroke:  a. No recurrent events b. Continuation of  Keppra 1000 mg twice daily for seizure prophylaxis   Follow up in 6 months or call earlier if needed  CC:  [facility] provider: Dr. Gertie Fey, Rozanna Boer, MD     I spent 30 minutes of face-to-face and non-face-to-face time with patient.  This included previsit chart review, lab review, study review, order entry, electronic health record documentation, patient education regarding prior stroke, importance of managing stroke risk factors, residual deficits, post stroke seizure,and answered all other questions to patients satisfaction. Aunt was met in lobby upon end of visit for update and answered all further questions   Frann Rider, [facility]  [facility] [address] [place], [place] [address]  Phone [phone] Fax [phone] Note: This document was prepared with digital dictation and possible smart phrase technology. Any transcriptional errors that result from this process are unintentional.

## 2020-05-01 NOTE — Progress Notes (Signed)
I agree with the above plan 

## 2020-05-01 NOTE — Patient Instructions (Signed)
Unable to determine if patient doing therapies - she is very interested in therapies if able especially with continued knee pain. I am more than happy to place orders if needed - please let me know.  Residual aphasia and spastic left hemiparesis appears stable since prior visit  Continue keppra 1000mg  twice daily for seizure prevention - no additional seizures reported   Continue aspirin 81 mg daily  and atorvastatin  for secondary stroke prevention Please ensure routine cholesterol level check to ensure satisfactory management with use of atorvastatin  Continue to follow up with PCP regarding cholesterol and blood pressure management  Maintain strict control of hypertension with blood pressure goal below 130/90, diabetes with hemoglobin A1c goal below 6.5% and cholesterol with LDL cholesterol (bad cholesterol) goal below 70 mg/dL.       Followup in the future with me in 6 months or call earlier if needed       Thank you for coming to see at [facility]. I hope we have been able to provide you high quality care today.  You may receive a patient satisfaction survey over the next few weeks. We would appreciate your feedback and comments so that we may continue to improve ourselves and the health of our patients.

## 2020-10-31 ENCOUNTER — Encounter: Payer: Self-pay | Admitting: Adult Health

## 2020-10-31 ENCOUNTER — Ambulatory Visit (INDEPENDENT_AMBULATORY_CARE_PROVIDER_SITE_OTHER): Payer: Medicare Other | Admitting: Adult Health

## 2020-10-31 ENCOUNTER — Telehealth: Payer: Self-pay | Admitting: Adult Health

## 2020-10-31 ENCOUNTER — Other Ambulatory Visit: Payer: Self-pay

## 2020-10-31 VITALS — BP 129/89 | HR 67 | Ht 63.0 in | Wt 213.0 lb

## 2020-10-31 DIAGNOSIS — F411 Generalized anxiety disorder: Secondary | ICD-10-CM

## 2020-10-31 DIAGNOSIS — R6889 Other general symptoms and signs: Secondary | ICD-10-CM

## 2020-10-31 DIAGNOSIS — Z9189 Other specified personal risk factors, not elsewhere classified: Secondary | ICD-10-CM

## 2020-10-31 DIAGNOSIS — H539 Unspecified visual disturbance: Secondary | ICD-10-CM

## 2020-10-31 DIAGNOSIS — I639 Cerebral infarction, unspecified: Secondary | ICD-10-CM

## 2020-10-31 DIAGNOSIS — R569 Unspecified convulsions: Secondary | ICD-10-CM

## 2020-10-31 DIAGNOSIS — G4452 New daily persistent headache (NDPH): Secondary | ICD-10-CM | POA: Diagnosis not present

## 2020-10-31 DIAGNOSIS — E785 Hyperlipidemia, unspecified: Secondary | ICD-10-CM

## 2020-10-31 DIAGNOSIS — E119 Type 2 diabetes mellitus without complications: Secondary | ICD-10-CM

## 2020-10-31 DIAGNOSIS — I611 Nontraumatic intracerebral hemorrhage in hemisphere, cortical: Secondary | ICD-10-CM | POA: Diagnosis not present

## 2020-10-31 NOTE — Progress Notes (Signed)
[facility] [address] [place]. [place] [address] [phone]       OFFICE FOLLOW UP NOTE  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 Things are the same as last visit.      HPI:  Today, 10/31/2020, Tammie Miles returns for 41-month stroke follow-up accompanied by her aunt, Tammie Miles, and facility staff, Lurena Joiner.  Long-term resident of [facility].  Reports continued aphasia and right spastic hemiparesis which has been stable.  Does complain of frontal headaches present over the past 4 months usually present upon awakening and gradually improves throughout the day. Does endorse vision changes ?blurred vision but unable to verify how long present. She has not been seen by ophthalmologist. Unable to verify if photophobia or phonophobia present.  She does endorse insomnia and daytime fatigue. Denies any seizure activity. Per MAR, remains on aspirin and atorvastatin as well as Keppra 1000 mg twice daily.  Blood pressure today 129/89.  No further concerns at this time.    History provided for reference purposes only Update 05/01/2020 JM: Tammie Miles returns for 87-month stroke follow-up unaccompanied.  She continues to reside at [facility]  Residual aphasia and right spastic hemiparesis -denies worsening Able to ambulate short distance otherwise primarily wheelchair-bound Continues to experience bilateral knee pain which interferes with ambulation but is able to ambulate short distance with rolling walker Denies new stroke/TIA symptoms  Remains on Keppra 1000 mg twice daily -denies side effects Denies recent seizure  activity  Reports compliance on aspirin 81 mg daily and atorvastatin 20 mg daily -denies side effects Blood pressure today initially elevated at 153/102 and on recheck 126/88 No concerns at this time  Update 10/28/2019 JM: Tammie Miles returns for stroke follow-up accompanied by her aunt. Stable from stroke standpoint with residual severe aphasia expressive> receptive and right spastic hemiparesis She is frustrated with her continued deficits and lack of therapy.  Primarily wheelchair-bound but is able to ambulate short distance Evaluated by ENT which showed mild subglottic stenosis but felt not impacting language difficulty Denies new or worsening stroke/TIA symptoms Remains on Keppra 1000 mg twice daily without seizure activity.  Denies side effects Continues on aspirin 81 mg daily without bleeding or bruising Continues on atorvastatin 20 mg daily without myalgias Blood pressure today 129/80 HTN and HLD managed by facility Underwent right knee arthroscopy with meniscal debridement on 08/13/2019 Does report mild improvement of pain No further concerns  Update 06/28/2019 JM: Tammie Miles returns for follow-up with history of stroke as well as discussion regarding undergoing left knee replacement procedure.  She is accompanied today by her aunt. She continues to reside at [facility] with residual stroke deficits of right hemiparesis and aphasia which have been stable with Tammie Miles reporting some improvement.  She has not been participating in therapies which she is frustrated about.  Per aunt, therapy discontinued due to lack of staff.  History of chronic knee pain previously undergoing right knee arthroplasty and has been having greater difficulty with left knee without benefit from injections.  Continues to follow with orthopedics and per aunt, concerns regarding recovery in setting of residual stroke deficits.  Ambulates short distance  but limited due to residual right hemiparesis and L>R knee pain.   Continues on Keppra 1000 mg twice daily tolerating well without recurrent seizure activity.  She underwent cranioplasty on 01/22/2019 without complication.  Continues on aspirin 81 mg daily and atorvastatin 20 mg daily for secondary stroke prevention.  Blood pressure today 138/89.  No concerns at this time.  Update 11/26/2018 JM: Tammie Miles is being seen today for stroke follow-up accompanied by her aunt.  Prior follow-up appointments canceled/rescheduled due to COVID-19 safety restrictions and difficulty with scheduling virtual visit.  She continues to reside at [facility].  Residual stroke deficits of right hemiparesis and severe expressive > mild to moderate receptive aphasia.  She is ambulatory for very short distances and assistance needed.  Aunt states she was doing well with improvement of ambulation but this worsened after seizure activity in 05/2018 and lack of therapy.  She was admitted to ED on 05/20/2018 due to seizure activity but denies additional seizure since that time.  She continues on Keppra without side effects.  Referral placed at prior visit to follow-up with neurosurgery but unfortunately due to difficulty getting a hold of Tammie Miles/facility, appointment was never made. Skullflap located right abdomen.  She continues on aspirin 81 mg daily for secondary stroke prevention without side effects.  Blood pressure today satisfactory at 135/89.  Denies new or worsening stroke/TIA symptoms.  Initial visit 03/05/2018 JM: Tammie Miles is being seen today for hospital follow-up and is accompanied by her aunt.  She continues to reside at [facility].  She continues to receive PT/OT/ST. she continues to have LUE flaccid and expressive > receptive aphasia.  Severe expressive aphasia present with only being able to say "no" but is able to nod her head appropriately.  Mild to moderate receptive aphasia present.  She has had PEG tube removed and is currently obtaining  all nutrition and pills orally.  She denies any complication in doing so and denies any aspiration or coughing with eating.  She is able to ambulate without assistance and denies any recent falls.  She needs minimal assistance with ADLs such as bathing and dressing.  She does feel as though she has been improving.  Continues on aspirin 81 mg without side effects of bleeding or bruising.  She continues on Keppra without recurrent seizures and has been tolerating well.  Blood pressure today satisfactory 135/82.  She denies follow-up appointment with neurosurgery since hospital discharge as recommended.  She does have skullcap placed right side of abdomen.  No further concerns at this time.  Denies new or worsening stroke/TIA symptoms.  Stroke admission: RANDY WHITENER is a 45 y.o. Female with PMH of HTN on hypertensive medications, antidepressants, stimulants, and Xanax which are all prescribed by her primary physician.  Per discharge summary notes by Dr. Pearlean Brownie, after awakening from a nap on 11/26/2017 she was noted to not be acting correctly and was dropping things.  Her 55-year-old daughter called the father multiple times and when he went and checked on her he believes that she was drunk and she was obtunded but moving her arms.  The following morning on 11/27/2017, she remained obtunded and the husband called 911 where she was brought into the emergency room via EMS.  Initial CT head reviewed and showed large area of hemorrhagic left basal ganglia and left frontal lobe was noted with a 5.8 x 3.5 cm along with 15 mm shift from the left to right with early herniation.  Neurosurgery was  immediately called for decompression.  Initial hematoma evacuation performed and after neuro worsening, hemicraniectomy was performed a few days later.  Initial insult felt to be ischemic with large hemorrhagic transformation and continued bleeding.  Unfortunately, Tammie Miles had a prolonged hospitalization due to lack of family support  and insurance for rehab.  Tammie Miles was started on Keppra for seizure prevention after episode of stiffness and tongue biting on 12/03/2017.  EEG did not show seizure activity.  Due to acute respiratory insufficiency, Tammie Miles was intubated and did undergo tracheostomy on 12/09/2017.  Blood pressure initially found to be elevated on admission but stabilized throughout hospitalization.  Due to continued dysphasia, PEG tube placed on 12/09/2017 for nutritional support.  Aspirin 81 mg initiated for secondary stroke prevention.  Therapies recommended SNF but due to lack of bed placement, Tammie Miles was transferred to CIR for ongoing PT/OT/ST.  During CIR stay, Tammie Miles was decannulated on 01/09/2018.  Due to continued therapy needs, Tammie Miles was discharged to [facility] on 01/20/2018.      ROS:   N/A d/t language barrier  PMH:  Past Medical History:  Diagnosis Date   Acute posthemorrhagic anemia    hx of    ADHD    Anxiety    Aphasia    Aphasia following unspecified cerebrovascular disease    Arthritis    Depression    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HCC)    Hemiplegia affecting right nondominant side (HCC)    Hypertension    Internal derangement of left knee    Seizures (HCC)    Stroke (HCC)    cerebral infarction due to occlusion or stenosis of left middle cerebral artery     PSH:  Past Surgical History:  Procedure Laterality Date   CRANIOPLASTY Left 01/22/2019   Procedure: CRANIOPLASTY;  Surgeon: Lisbeth Renshaw, MD;  Location: [facility] OR;  Service: Neurosurgery;  Laterality: Left;  CRANIOPLASTY   CRANIOTOMY Left 11/27/2017   Procedure: CRANIOTOMY for Evacuation of Intracranial Hemorrhage;  Surgeon: Lisbeth Renshaw, MD;  Location: [facility] OR;  Service: Neurosurgery;  Laterality: Left;  CRANIOTOMY for Evacuation of Intracranial Hemorrhage   CRANIOTOMY Left 11/28/2017   Procedure: Decompressive craniectomy;  Surgeon: Maeola Harman, MD;  Location: [facility] OR;  Service:  Neurosurgery;  Laterality: Left;   ESOPHAGOGASTRODUODENOSCOPY N/A 12/09/2017   Procedure: ESOPHAGOGASTRODUODENOSCOPY (EGD);  Surgeon: Violeta Gelinas, MD;  Location: [facility] ENDOSCOPY;  Service: General;  Laterality: N/A;   KNEE ARTHROSCOPY WITH MENISCAL REPAIR Right 08/13/2019   Procedure: KNEE ARTHROSCOPY WITH PARTIAL MENISECTOMY, CHONDRAPLASTY MEDIAL LATERAL, PATELLA-FEMORAL;  Surgeon: Jodi Geralds, MD;  Location: [facility] ORS;  Service: Orthopedics;  Laterality: Right;   KNEE SURGERY Bilateral    PEG PLACEMENT N/A 12/09/2017   Procedure: PERCUTANEOUS ENDOSCOPIC GASTROSTOMY (PEG) PLACEMENT;  Surgeon: Violeta Gelinas, MD;  Location: [facility] ENDOSCOPY;  Service: General;  Laterality: N/A;    Social History:  Social History   Socioeconomic History   Marital status: Married    Spouse name: Not on file   Number of children: 1   Years of education: Not on file   Highest education level: Not on file  Occupational History   Not on file  Tobacco Use   Smoking status: Never   Smokeless tobacco: Never  Vaping Use   Vaping Use: Never used  Substance and Sexual Activity   Alcohol use: Not Currently   Drug use: Not Currently   Sexual activity: Yes  Other Topics Concern   Not on file  Social History Narrative   Not on file  Social Determinants of Health   Financial Resource Strain: Not on file  Food Insecurity: Not on file  Transportation Needs: Not on file  Physical Activity: Not on file  Stress: Not on file  Social Connections: Not on file  Intimate Partner Violence: Not on file    Family History:  Family History  Problem Relation Age of Onset   Hypertension Mother    Hypertension Father    Breast cancer Maternal Aunt     Medications:   Current Outpatient Medications on File Prior to Visit  Medication Sig Dispense Refill   aspirin 81 MG chewable tablet Place 1 tablet (81 mg total) into feeding tube daily. (Tammie Miles taking differently: Chew 81 mg by mouth daily.)     atorvastatin (LIPITOR)  40 MG tablet Take 40 mg by mouth at bedtime.     Azelastine HCl 137 MCG/SPRAY SOLN Place 1 spray into the nose daily.     cetirizine (ZYRTEC) 5 MG tablet Take 5 mg by mouth daily.     acetaminophen (TYLENOL) 500 MG tablet Take 1,000 mg by mouth 3 (three) times daily.     acetaminophen-codeine (TYLENOL #3) 300-30 MG tablet Take 1 tablet by mouth 2 (two) times daily as needed for moderate pain.     amLODipine (NORVASC) 10 MG tablet Take 10 mg by mouth daily.     atenolol (TENORMIN) 100 MG tablet Take 100 mg by mouth at bedtime.     b complex vitamins tablet Take 1 tablet by mouth daily.     baclofen (LIORESAL) 10 MG tablet Take 10 mg by mouth in the morning and at bedtime.     citalopram (CELEXA) 10 MG tablet Take 10 mg by mouth at bedtime.     GUAIFENESIN PO Take by mouth.     levETIRAcetam (KEPPRA) 1000 MG tablet Take 1,000 mg by mouth every 12 (twelve) hours.     lidocaine (LMX) 4 % cream Apply 1 application topically See admin instructions. Apply to old trach site once daily, may use an additional 2 times daily as needed for pain at old trach site     MELATONIN PO Take 8 mg by mouth at bedtime.      naproxen sodium (ALEVE) 220 MG tablet Take 220 mg by mouth 2 (two) times daily as needed (pain).     polyethylene glycol (MIRALAX / GLYCOLAX) 17 g packet Take 17 g by mouth daily as needed for moderate constipation. Mix in 4-8 oz liquid and drink     potassium chloride SA (KLOR-CON) 20 MEQ tablet Take 20 mEq by mouth every Monday, Wednesday, and Friday.     sennosides-docusate sodium (SENOKOT-S) 8.6-50 MG tablet Take 2 tablets by mouth daily as needed for constipation.     traZODone (DESYREL) 100 MG tablet Take 100 mg by mouth at bedtime.     No current facility-administered medications on file prior to visit.    Allergies:  No Known Allergies   Physical Exam  Vitals:   10/31/20 0829  BP: 129/89  Pulse: 67  Weight: 213 lb (96.6 kg)  Height: 5\' 3"  (1.6 m)    Body mass index is 37.73  kg/m. No results found.  General: well developed, well nourished, pleasant African-American female, seated, in no evident distress Neck: supple with no carotid or supraclavicular bruits Cardiovascular: regular rate and rhythm, no murmurs Vascular:  Normal pulses all extremities  Neurologic Exam Mental Status: Awake and fully alert. Severe expressive aphasia with mild receptive aphasia.  No evidence of  cognitive difficulties.  Able to say yes and no appropriately.  Mood and affect appropriate and cooperative with exam Cranial Nerves: Pupils equal, briskly reactive to light. Extraocular movements full without nystagmus. Visual fields blink to threat bilaterally.  Hearing intact. Facial sensation intact.  Right lower facial weakness Motor: Full strength left upper and lower extremity RUE: 1/5 with spasticity greater in deltoid and fingers RLE: 3/5 ankle dorsiflexion weakness. 5/5 hip flexor, knee extension and flexion Sensory.: intact to touch , pinprick , position and vibratory sensation.  Coordination: Rapid alternating movements normal except RUE. Finger-to-nose and heel-to-shin performed accurately on left side Gait and Station: Deferred Reflexes: 2+ right side and 1+ right side. Toes downgoing.       ASSESSMENT: Urban GibsonKelly L Hanssen is a 45 y.o. 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  Seizure activity 11/2017 placed on Keppra.  Cranioplasty on 01/22/2019 without complication.  Initial insult felt to be ischemic with large hemorrhagic transformation and continued bleeding.  Hospitalization complicated by acute respiratory insufficiency status post trach with decannulation, seizure, hypertensive emergency, anemia, and dysphagia s/p PEG. Vascular risk factors include HTN and  depression.      PLAN:  Left MCA infarct with hemorrhagic transformation:  Residual deficits: Severe expressive aphasia, mild receptive aphasia and right spastic hemiparesis - stable  Continue aspirin 81 mg daily and atorvastatin 20 mg daily for secondary stroke prevention Discussed secondary stroke prevention measures and importance of close PCP follow-up for aggressive stroke risk factor management HTN: BP goal<130/90.  Stable. Managed by PCP/facility HLD: LDL goal<70.  Prior LDL 64 (11/2017) on atorvastatin 20 mg daily.  Repeat lipid panel today Pre-DM: A1c goal<7.  Prior A1c 5.8 (11/2017).  Repeat A1c today New daily headaches:  appear more tension related but due to language barrier, unable to verify if ?blurred vision new onset or associated with headaches.  With prior hx, recommend obtaining MR brain w/wo contrast to rule out underlying abnormalities. Referral placed to [facility] sleep clinic for possible underlying sleep apnea contributing to morning headaches Referral placed to ophthalmology for visual complaints Obtain labs: CBC, CMP, B12 and TSH Due to current use of multiple medications, will hold off on starting any further medications at this time until above work-up completed Seizure, onset of stroke:  No recurrent events Continuation of Keppra 1000 mg twice daily for seizure prophylaxis Last seizure activity 05/2018    Follow up in 4 months or call earlier if needed   CC:  [facility] provider: Dr. Eliott NineSethi Blake, Denny LevyKhashana A, MD     I spent 46 minutes of face-to-face and non-face-to-face time with Tammie Miles, aunt and caregiver.  This included previsit chart review, lab review, study review, order entry, electronic health record documentation, Tammie Miles and aunt education regarding prior stroke, importance of managing stroke risk factors, residual deficits, post stroke seizure, new onset daily headaches and further work-up and answered all other questions to patients satisfaction.    Ihor AustinJessica McCue, AGNP-BC  [facility] [address] [place], [place] [id number]  Phone [phone] Fax [phone] Note: This document was prepared with digital dictation and possible smart phrase technology. Any transcriptional errors that result from this process are unintentional.

## 2020-10-31 NOTE — Telephone Encounter (Signed)
Medicare/medicaid order sent to [facility]. [facility] they will reach out to the patient to schedule.

## 2020-10-31 NOTE — Patient Instructions (Addendum)
For new onset headaches with decreased vision, recommend obtaining MRI brain w/wo contrast  We will also obtain lab work today  Referral placed to ophthalmology for further visual examination  Referral placed to our sleep clinic for evaluation of possible underlying sleep apnea  Continue Keppra 1000 mg twice daily for seizure prevention  Continue aspirin 81 mg daily  and atorvastatin for secondary stroke prevention  Continue to follow up with PCP regarding cholesterol and blood pressure management  Maintain strict control of hypertension with blood pressure goal below 130/90 and cholesterol with LDL cholesterol (bad cholesterol) goal below 70 mg/dL.      Followup in the future with me in 4 months or call earlier if needed     Thank you for coming to see [place] at [facility]. I hope we have been able to provide you high quality care today.  You may receive a patient satisfaction survey over the next few weeks. We would appreciate your feedback and comments so that we may continue to improve ourselves and the health of our patients.

## 2020-11-01 ENCOUNTER — Telehealth: Payer: Self-pay | Admitting: *Deleted

## 2020-11-01 LAB — COMPREHENSIVE METABOLIC PANEL
ALT: 24 IU/L (ref 0–32)
AST: 26 IU/L (ref 0–40)
Albumin/Globulin Ratio: 1.6 (ref 1.2–2.2)
Albumin: 4.5 g/dL (ref 3.8–4.8)
Alkaline Phosphatase: 88 IU/L (ref 44–121)
BUN/Creatinine Ratio: 12 (ref 9–23)
BUN: 13 mg/dL (ref 6–24)
Bilirubin Total: 0.6 mg/dL (ref 0.0–1.2)
CO2: 22 mmol/L (ref 20–29)
Calcium: 9.9 mg/dL (ref 8.7–10.2)
Chloride: 98 mmol/L (ref 96–106)
Creatinine, Ser: 1.1 mg/dL — ABNORMAL HIGH (ref 0.57–1.00)
Globulin, Total: 2.8 g/dL (ref 1.5–4.5)
Glucose: 128 mg/dL — ABNORMAL HIGH (ref 65–99)
Potassium: 3.5 mmol/L (ref 3.5–5.2)
Sodium: 141 mmol/L (ref 134–144)
Total Protein: 7.3 g/dL (ref 6.0–8.5)
eGFR: 64 mL/min/{1.73_m2} (ref >59)

## 2020-11-01 LAB — CBC
Hematocrit: 40.9 % (ref 34.0–46.6)
Hemoglobin: 13.2 g/dL (ref 11.1–15.9)
MCH: 26.1 pg — ABNORMAL LOW (ref 26.6–33.0)
MCHC: 32.3 g/dL (ref 31.5–35.7)
MCV: 81 fL (ref 79–97)
Platelets: 281 10*3/uL (ref 150–450)
RBC: 5.05 x10E6/uL (ref 3.77–5.28)
RDW: 14 % (ref 11.7–15.4)
WBC: 7.7 10*3/uL (ref 3.4–10.8)

## 2020-11-01 LAB — LIPID PANEL
Chol/HDL Ratio: 4.6 ratio — ABNORMAL HIGH (ref 0.0–4.4)
Cholesterol, Total: 132 mg/dL (ref 100–199)
HDL: 29 mg/dL — ABNORMAL LOW (ref >39)
LDL Chol Calc (NIH): 64 mg/dL (ref 0–99)
Triglycerides: 243 mg/dL — ABNORMAL HIGH (ref 0–149)
VLDL Cholesterol Cal: 39 mg/dL (ref 5–40)

## 2020-11-01 LAB — HEMOGLOBIN A1C
Est. average glucose Bld gHb Est-mCnc: 134 mg/dL
Hgb A1c MFr Bld: 6.3 % — ABNORMAL HIGH (ref 4.8–5.6)

## 2020-11-01 LAB — TSH: TSH: 2.17 u[IU]/mL (ref 0.450–4.500)

## 2020-11-01 LAB — VITAMIN B12: Vitamin B-12: 1596 pg/mL — ABNORMAL HIGH (ref 232–1245)

## 2020-11-01 NOTE — Progress Notes (Signed)
I agree with the above plan 

## 2020-11-01 NOTE — Telephone Encounter (Signed)
Called [facility], spoke with Adela Lank and informed her  that recent lab work showed satisfactory LDL or bad cholesterol but elevated triglycerides as well as elevated A1c at 6.3 - request PCP to further evaluate. Also slight increase in kidney function which can be further evaluated by PCP.  I advised her I'll  fax lab results to [facility], attention Dr [name] for review. Adela Lank stated she would watch for fax and be sure MD got information,  verbalized understanding, appreciation. Faxed lab results, received confirmation.

## 2020-11-06 ENCOUNTER — Telehealth: Payer: Self-pay

## 2020-11-06 NOTE — Telephone Encounter (Signed)
Referral for Ophthalmology sent to [facility]. P: [phone].

## 2020-12-20 ENCOUNTER — Emergency Department (HOSPITAL_BASED_OUTPATIENT_CLINIC_OR_DEPARTMENT_OTHER): Payer: Medicare Other

## 2020-12-20 ENCOUNTER — Encounter (HOSPITAL_COMMUNITY): Payer: Self-pay

## 2020-12-20 ENCOUNTER — Emergency Department (HOSPITAL_COMMUNITY): Payer: Medicare Other

## 2020-12-20 ENCOUNTER — Other Ambulatory Visit: Payer: Self-pay

## 2020-12-20 ENCOUNTER — Emergency Department (HOSPITAL_COMMUNITY)
Admission: EM | Admit: 2020-12-20 | Discharge: 2020-12-20 | Disposition: A | Discharge status: Home / Self Care | Payer: Medicare Other | Attending: Emergency Medicine | Admitting: Emergency Medicine

## 2020-12-20 DIAGNOSIS — Z7982 Long term (current) use of aspirin: Secondary | ICD-10-CM | POA: Diagnosis not present

## 2020-12-20 DIAGNOSIS — M79621 Pain in right upper arm: Secondary | ICD-10-CM | POA: Diagnosis not present

## 2020-12-20 DIAGNOSIS — M7989 Other specified soft tissue disorders: Secondary | ICD-10-CM | POA: Diagnosis not present

## 2020-12-20 DIAGNOSIS — M79601 Pain in right arm: Secondary | ICD-10-CM | POA: Insufficient documentation

## 2020-12-20 DIAGNOSIS — I10 Essential (primary) hypertension: Secondary | ICD-10-CM | POA: Insufficient documentation

## 2020-12-20 DIAGNOSIS — M792 Neuralgia and neuritis, unspecified: Secondary | ICD-10-CM

## 2020-12-20 DIAGNOSIS — Z79899 Other long term (current) drug therapy: Secondary | ICD-10-CM | POA: Insufficient documentation

## 2020-12-20 LAB — BASIC METABOLIC PANEL
Anion gap: 9 (ref 5–15)
BUN: 16 mg/dL (ref 6–20)
CO2: 27 mmol/L (ref 22–32)
Calcium: 9.6 mg/dL (ref 8.9–10.3)
Chloride: 106 mmol/L (ref 98–111)
Creatinine, Ser: 1.07 mg/dL — ABNORMAL HIGH (ref 0.44–1.00)
GFR, Estimated: >60 mL/min (ref >60)
Glucose, Bld: 92 mg/dL (ref 70–99)
Potassium: 3.1 mmol/L — ABNORMAL LOW (ref 3.5–5.1)
Sodium: 142 mmol/L (ref 135–145)

## 2020-12-20 LAB — CBC
HCT: 38.8 % (ref 36.0–46.0)
Hemoglobin: 12.5 g/dL (ref 12.0–15.0)
MCH: 27.5 pg (ref 26.0–34.0)
MCHC: 32.2 g/dL (ref 30.0–36.0)
MCV: 85.3 fL (ref 80.0–100.0)
Platelets: 259 10*3/uL (ref 150–400)
RBC: 4.55 MIL/uL (ref 3.87–5.11)
RDW: 15.4 % (ref 11.5–15.5)
WBC: 7.3 10*3/uL (ref 4.0–10.5)
nRBC: 0 % (ref 0.0–0.2)

## 2020-12-20 LAB — I-STAT BETA HCG BLOOD, ED (MC, WL, AP ONLY): I-stat hCG, quantitative: <5 m[IU]/mL (ref <5)

## 2020-12-20 MED ORDER — POTASSIUM CHLORIDE CRYS ER 20 MEQ PO TBCR
40.0000 meq | EXTENDED_RELEASE_TABLET | Freq: Once | ORAL | Status: AC
  Administered 2020-12-20: 40 meq via ORAL
  Filled 2020-12-20: qty 2

## 2020-12-20 MED ORDER — KETOROLAC TROMETHAMINE 15 MG/ML IJ SOLN
30.0000 mg | Freq: Once | INTRAMUSCULAR | Status: AC
  Administered 2020-12-20: 30 mg via INTRAMUSCULAR
  Filled 2020-12-20: qty 2

## 2020-12-20 MED ORDER — GABAPENTIN 100 MG PO CAPS: 100.0000 mg | ORAL_CAPSULE | Freq: Three times a day (TID) | ORAL | 0 refills | Status: DC

## 2020-12-20 NOTE — ED Notes (Signed)
[facility] called to transport pt back to [facility]

## 2020-12-20 NOTE — Progress Notes (Signed)
RUE venous duplex has been completed.  Preliminary results given to Dr. Karene Fry.   Results can be found under chart review under CV PROC. 12/20/2020 7:03 PM Avyana Puffenbarger RVT, RDMS

## 2020-12-20 NOTE — Discharge Instructions (Addendum)
Your symptoms are likely due to your chronic contracture from your old stroke.  We will start you on a low-dose of gabapentin and have you follow-up with your PCP and eventually your neurologist in clinic for reassessment.  There is no evidence of blood clot in your right arm and no evidence of fracture of your right wrist.

## 2020-12-20 NOTE — ED Triage Notes (Signed)
Pt from [facility], c/o rt arm pain and swelling for two days. Hx of stroke. Pt has word book to communicate. A&Ox4 per EMS. Rt arm deficit d/t stroke. No hx of dvt, not on blood thinners, no falls. Per EMS BP 180/110, hr 73, spo2 97 on RA.

## 2020-12-20 NOTE — ED Provider Notes (Signed)
[facility]-EMERGENCY DEPT Provider Note   CSN: [id number] Arrival date & time: 12/20/20  1642     History Chief Complaint  Patient presents with   Rt arm swelling/pain    Tammie Miles is a 45 y.o. female.  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  The patient presents from [facility] and rehab.  She has been complaining of right arm pain with additional right upper extremity swelling for the past 2 days.  No recent falls or injuries per EMS.  The patient points to her right wrist and right upper extremity.  She has contractures at baseline as a residual deficit from her prior CVA.  Per neurology notes, "Today, 10/31/2020, Ms. Derosa returns for 77-month stroke follow-up accompanied by her aunt, Britta Mccreedy, and facility staff, Lurena Joiner.  Long-term resident of [facility].  Reports continued aphasia and right spastic hemiparesis which has been stable.  Does complain of frontal headaches present over the past 4 months usually present upon awakening and gradually improves throughout the day. Does endorse vision changes ?blurred vision but unable to verify how long present. She has not been seen by ophthalmologist. Unable to verify if photophobia or phonophobia present.  She does endorse insomnia and daytime fatigue. Denies any seizure activity. Per MAR, remains on aspirin and atorvastatin as well as Keppra 1000 mg twice daily.  Blood pressure today 129/89.  No further concerns at this time."  Past Medical History:  Diagnosis Date   Acute posthemorrhagic anemia    hx of    ADHD    Anxiety    Aphasia    Aphasia following unspecified cerebrovascular disease    Arthritis    Depression    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HCC)    Hemiplegia affecting right nondominant side (HCC)    Hypertension    Internal derangement of left knee     Seizures (HCC)    Stroke (HCC)    cerebral infarction due to occlusion or stenosis of left middle cerebral artery     Patient Active Problem List   Diagnosis Date Noted   Skull defect 01/22/2019   Cerebral edema (HCC) 01/16/2018   At high risk for injury related to fall 01/16/2018   Seizures (HCC) 01/16/2018   Attention deficit hyperactivity disorder (ADHD) 01/16/2018   Generalized anxiety disorder 01/16/2018   Depression 01/16/2018   Tracheostomy in place [facility])    Pressure injury of skin 12/29/2017   Altered mental status    Central venous catheter in place    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/02/2017   Stroke (cerebrum) (HCC) 12/02/2017   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   Hypertensive emergency 12/02/2017   Anemia 12/02/2017   Leukocytosis 12/02/2017   Acute intra-cranial hemorrhage (HCC)    Respiratory failure (HCC)    ICH (intracerebral hemorrhage) (HCC) 11/27/2017   Intracranial hemorrhage (HCC) 11/27/2017    Past Surgical History:  Procedure Laterality Date   CRANIOPLASTY Left 01/22/2019   Procedure: CRANIOPLASTY;  Surgeon: Lisbeth Renshaw, MD;  Location: [facility] OR;  Service: Neurosurgery;  Laterality: Left;  CRANIOPLASTY   CRANIOTOMY Left 11/27/2017   Procedure: CRANIOTOMY for Evacuation of Intracranial Hemorrhage;  Surgeon: Lisbeth Renshaw, MD;  Location: [facility] OR;  Service: Neurosurgery;  Laterality: Left;  CRANIOTOMY for Evacuation of Intracranial Hemorrhage   CRANIOTOMY Left 11/28/2017   Procedure: Decompressive craniectomy;  Surgeon: Maeola Harman,  MD;  Location: [facility] OR;  Service: Neurosurgery;  Laterality: Left;   ESOPHAGOGASTRODUODENOSCOPY N/A 12/09/2017   Procedure: ESOPHAGOGASTRODUODENOSCOPY (EGD);  Surgeon: Violeta Gelinas, MD;  Location: [facility] ENDOSCOPY;  Service: General;  Laterality: N/A;   KNEE ARTHROSCOPY WITH MENISCAL REPAIR Right 08/13/2019   Procedure: KNEE ARTHROSCOPY WITH PARTIAL MENISECTOMY, CHONDRAPLASTY MEDIAL LATERAL,  PATELLA-FEMORAL;  Surgeon: Jodi Geralds, MD;  Location: [facility] ORS;  Service: Orthopedics;  Laterality: Right;   KNEE SURGERY Bilateral    PEG PLACEMENT N/A 12/09/2017   Procedure: PERCUTANEOUS ENDOSCOPIC GASTROSTOMY (PEG) PLACEMENT;  Surgeon: Violeta Gelinas, MD;  Location: [facility] ENDOSCOPY;  Service: General;  Laterality: N/A;     OB History   No obstetric history on file.     Family History  Problem Relation Age of Onset   Hypertension Mother    Hypertension Father    Breast cancer Maternal Aunt     Social History   Tobacco Use   Smoking status: Never   Smokeless tobacco: Never  Vaping Use   Vaping Use: Never used  Substance Use Topics   Alcohol use: Not Currently   Drug use: Not Currently    Home Medications Prior to Admission medications   Medication Sig Start Date End Date Taking? Authorizing Provider  gabapentin (NEURONTIN) 100 MG capsule Take 1 capsule (100 mg total) by mouth 3 (three) times daily. 12/20/20 01/19/21 Yes Ernie Avena, MD  acetaminophen (TYLENOL) 500 MG tablet Take 1,000 mg by mouth 3 (three) times daily.    [provider]  acetaminophen-codeine (TYLENOL #3) 300-30 MG tablet Take 1 tablet by mouth 2 (two) times daily as needed for moderate pain.    [provider]  aspirin 81 MG chewable tablet Place 1 tablet (81 mg total) into feeding tube daily. Patient taking differently: Chew 81 mg by mouth daily. 02/04/19   Costella, Darci Current, PA-C  atenolol (TENORMIN) 100 MG tablet Take 100 mg by mouth at bedtime.    [provider]  atorvastatin (LIPITOR) 40 MG tablet Take 40 mg by mouth at bedtime.    [provider]  Azelastine HCl 137 MCG/SPRAY SOLN Place 1 spray into the nose daily.    [provider]  b complex vitamins tablet Take 1 tablet by mouth daily.    [provider]  baclofen (LIORESAL) 10 MG tablet Take 10 mg by mouth in the morning and at bedtime.    [provider]  cetirizine (ZYRTEC) 5  MG tablet Take 5 mg by mouth daily.    [provider]  chlorhexidine (PERIDEX) 0.12 % solution SMARTSIG:By Mouth 08/23/20   [provider]  diclofenac Sodium (VOLTAREN) 1 % GEL Apply topically as needed.    [provider]  escitalopram (LEXAPRO) 10 MG tablet Take 10 mg by mouth daily.    [provider]  levETIRAcetam (KEPPRA) 1000 MG tablet Take 1,000 mg by mouth every 12 (twelve) hours.    [provider]  lidocaine (LMX) 4 % cream Apply 1 application topically See admin instructions. Apply to old trach site once daily, may use an additional 2 times daily as needed for pain at old trach site    [provider]  MELATONIN PO Take 8 mg by mouth at bedtime.     [provider]  metFORMIN (GLUCOPHAGE-XR) 500 MG 24 hr tablet Take 500 mg by mouth daily. 09/01/20   [provider]  mirtazapine (REMERON) 7.5 MG tablet Take 7.5 mg by mouth at bedtime.    [provider]  naproxen sodium (ALEVE) 220 MG tablet Take 220 mg by mouth 2 (two) times daily as needed (pain).    [provider]  polyethylene glycol (MIRALAX / GLYCOLAX) 17 g packet Take 17 g by mouth daily as needed for moderate constipation. Mix in 4-8 oz liquid and drink    [provider]  potassium chloride SA (KLOR-CON) 20 MEQ tablet Take 20 mEq by mouth every Monday, Wednesday, and Friday.    [provider]  traZODone (DESYREL) 100 MG tablet Take 100 mg by mouth at bedtime.    [provider]    Allergies    Patient has no known allergies.  Review of Systems   Review of Systems  Unable to perform ROS: Patient nonverbal   Physical Exam Updated Vital Signs BP (!) 138/93   Pulse 64   Temp 98 F (36.7 C)   Resp (!) 26   Ht 5\' 3"  (1.6 m)   Wt 104.3 kg   LMP 12/03/2020   SpO2 98%   BMI 40.74 kg/m   Physical Exam Vitals and nursing note reviewed.  Constitutional:      General: She is not in acute distress.     Appearance: She is well-developed.  HENT:     Head: Normocephalic and atraumatic.  Eyes:     Conjunctiva/sclera: Conjunctivae normal.     Pupils: Pupils are equal, round, and reactive to light.  Cardiovascular:     Rate and Rhythm: Normal rate and regular rhythm.     Heart sounds: No murmur heard. Pulmonary:     Effort: Pulmonary effort is normal. No respiratory distress.     Breath sounds: Normal breath sounds.  Abdominal:     General: There is no distension.     Palpations: Abdomen is soft.     Tenderness: There is no abdominal tenderness. There is no guarding.  Musculoskeletal:        General: No deformity or signs of injury.     Cervical back: Normal range of motion and neck supple.  Skin:    General: Skin is warm and dry.     Findings: No lesion or rash.  Neurological:     Mental Status: She is alert.    ED Results / Procedures / Treatments   Labs (all labs ordered are listed, but only abnormal results are displayed) Labs Reviewed  BASIC METABOLIC PANEL - Abnormal; Notable for the following components:      Result Value   Potassium 3.1 (*)    Creatinine, Ser 1.07 (*)    All other components within normal limits  CBC  I-STAT BETA HCG BLOOD, ED ([facility], [facility], [facility] ONLY)    EKG None  Radiology DG Wrist Complete Right  Result Date: 12/20/2020 CLINICAL DATA:  Wrist pain. Right arm paralyzed due to stroke. Swollen painful for 2 days. No known injury. EXAM: RIGHT WRIST - COMPLETE 3+ VIEW COMPARISON:  None. FINDINGS: Slightly limited evaluation due to nonstandard views. No evidence of fracture or dislocation. Mild degenerative changes of the radiocarpal joint. No evidence of severe arthropathy. No aggressive appearing focal bone abnormality. Subcutaneus soft tissue edema. IMPRESSION: No acute displaced fracture or dislocation. Electronically Signed   By: 02/19/2021 M.D.   On: 12/20/2020 21:01   UE Venous Duplex ([facility] and [facility] ONLY)  Result Date: 12/20/2020 UPPER VENOUS STUDY   Patient Name:  MURRIEL HOLWERDA  Date of Exam:   12/20/2020 Medical Rec #: 02/19/2021      Accession #:  [id number] Date of Birth: 1975/05/05      Patient Gender: F Patient Age:   38 years Exam Location:  [facility] Procedure:      VAS [place] UPPER EXTREMITY VENOUS DUPLEX Referring Phys: Ernie Avena --------------------------------------------------------------------------------  Indications: RUE pain and swelling for two days Risk Factors: HX of CVA with RUE deficit. Limitations: Patient immobility and pain intolerance, body habitus and poor ultrasound/tissue interface. Comparison Study: No previous exams Performing Technologist: Jody Hill RVT, RDMS  Examination Guidelines: A complete evaluation includes B-mode imaging, spectral Doppler, color Doppler, and power Doppler as needed of all accessible portions of each vessel. Bilateral testing is considered an integral part of a complete examination. Limited examinations for reoccurring indications may be performed as noted.  Right Findings: +----------+------------+---------+-----------+----------+--------------+ RIGHT     CompressiblePhasicitySpontaneousProperties   Summary     +----------+------------+---------+-----------+----------+--------------+ IJV           Full       Yes       Yes                             +----------+------------+---------+-----------+----------+--------------+ Subclavian    Full       Yes       Yes                             +----------+------------+---------+-----------+----------+--------------+ Axillary      Full       Yes       Yes                             +----------+------------+---------+-----------+----------+--------------+ Brachial      Full       Yes       Yes                             +----------+------------+---------+-----------+----------+--------------+ Radial        Full                                                  +----------+------------+---------+-----------+----------+--------------+ Ulnar                                               Not visualized +----------+------------+---------+-----------+----------+--------------+ Cephalic      Full                                                 +----------+------------+---------+-----------+----------+--------------+ Basilic       Full       Yes       Yes                             +----------+------------+---------+-----------+----------+--------------+  Left Findings: +----------+------------+---------+-----------+----------+-------+ LEFT      CompressiblePhasicitySpontaneousPropertiesSummary +----------+------------+---------+-----------+----------+-------+ Subclavian    Full       Yes       Yes                      +----------+------------+---------+-----------+----------+-------+  Summary:  Right: No evidence of deep vein thrombosis in the upper extremity. No evidence of superficial vein thrombosis in the upper extremity. However, unable to visualize the ulnar veins.  Left: No evidence of thrombosis in the subclavian.  *See table(s) above for measurements and observations.  Diagnosing physician: Coral Else MD Electronically signed by Coral Else MD on 12/20/2020 at 9:23:35 PM.    Final     Procedures Procedures   Medications Ordered in ED Medications  potassium chloride SA (KLOR-CON) CR tablet 40 mEq (40 mEq Oral Given 12/20/20 2025)  ketorolac (TORADOL) 15 MG/ML injection 30 mg (30 mg Intramuscular Given 12/20/20 2022)    ED Course  I have reviewed the triage vital signs and the nursing notes.  Pertinent labs & imaging results that were available during my care of the patient were reviewed by me and considered in my medical decision making (see chart for details).    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  The patient presents from [facility] and rehab.  She has been complaining of right arm pain with additional right upper extremity swelling for the past 2 days.  No recent falls or injuries per EMS.  The patient points to her right wrist and right upper extremity.  She has contractures at baseline as a residual deficit from her prior CVA.  Differential diagnosis includes DVT, acute fracture, electrolyte abnormality, neuropathic pain.  DVT ultrasound was performed which resulted negative for DVT.  X-ray of the wrist was negative for acute fracture or malalignment.  Patient was mildly hypokalemic to 3.1, replenished orally.  No other abnormalities on laboratory work-up.  The patient endorses a burning pain in her arm which could be neuropathic pain due to her chronic contractures from her prior CVA.  I recommended that she start on low-dose gabapentin for her pain with a plan to follow-up with her PCP and eventually neurology for reassessment.  Patient endorsed understanding.  Final Clinical Impression(s) / ED Diagnoses Final diagnoses:  Neuropathic pain    Rx / DC Orders ED Discharge Orders          Ordered    gabapentin (NEURONTIN) 100 MG capsule  3 times daily        12/20/20 2109             Ernie Avena, MD 12/21/20 1030

## 2021-03-01 ENCOUNTER — Encounter: Payer: Self-pay | Admitting: Adult Health

## 2021-03-01 ENCOUNTER — Ambulatory Visit (INDEPENDENT_AMBULATORY_CARE_PROVIDER_SITE_OTHER): Payer: Medicare Other | Admitting: Adult Health

## 2021-03-01 ENCOUNTER — Other Ambulatory Visit: Payer: Self-pay

## 2021-03-01 VITALS — BP 148/95 | HR 74

## 2021-03-01 DIAGNOSIS — I611 Nontraumatic intracerebral hemorrhage in hemisphere, cortical: Secondary | ICD-10-CM

## 2021-03-01 DIAGNOSIS — G44229 Chronic tension-type headache, not intractable: Secondary | ICD-10-CM

## 2021-03-01 DIAGNOSIS — I69320 Aphasia following cerebral infarction: Secondary | ICD-10-CM | POA: Diagnosis not present

## 2021-03-01 DIAGNOSIS — I639 Cerebral infarction, unspecified: Secondary | ICD-10-CM

## 2021-03-01 DIAGNOSIS — R569 Unspecified convulsions: Secondary | ICD-10-CM

## 2021-03-01 DIAGNOSIS — G8111 Spastic hemiplegia affecting right dominant side: Secondary | ICD-10-CM

## 2021-03-01 MED ORDER — GABAPENTIN 300 MG PO CAPS: 300.0000 mg | ORAL_CAPSULE | Freq: Three times a day (TID) | ORAL | 5 refills | Status: DC

## 2021-03-01 NOTE — Patient Instructions (Addendum)
Highly recommend pursuing evaluation for sleep apnea which could be contributing to continued headaches - scheduled initial evaluation today - please ensure her aunt is available for this consult visit (ordered at prior visit - multiple prior calls made to schedule)  Please contact [facility] to complete MR brain which was previously ordered   Increase gabapentin dose to 300mg  three times daily due to continued right arm pain and continued headaches. If headaches persist, can consider increased Effexor - will defer to SNF  Can also consider increasing baclofen dose if arm pain persists - defer to SNF  Continue keppra for seizure prevention   Continue aspirin 81 mg daily  and atorvastatin  for secondary stroke prevention  Continue to follow up with PCP regarding cholesterol, blood pressure and diabetes management  Maintain strict control of hypertension with blood pressure goal below 130/90, diabetes with hemoglobin A1c goal below 7.0 % and cholesterol with LDL cholesterol (bad cholesterol) goal below 70 mg/dL.   Signs of a Stroke? Follow the BEFAST method:  Balance Watch for a sudden loss of balance, trouble with coordination or vertigo Eyes Is there a sudden loss of vision in one or both eyes? Or double vision?  Face: Ask the person to smile. Does one side of the face droop or is it numb?  Arms: Ask the person to raise both arms. Does one arm drift downward? Is there weakness or numbness of a leg? Speech: Ask the person to repeat a simple phrase. Does the speech sound slurred/strange? Is the person confused ? Time: If you observe any of these signs, call 911.     Followup in the future with me in 6 months or call earlier if needed       Thank you for coming to see at [facility]. I hope we have been able to provide you high quality care today.  You may receive a patient satisfaction survey over the next few weeks. We would appreciate your feedback and  comments so that we may continue to improve ourselves and the health of our patients.

## 2021-03-01 NOTE — Progress Notes (Signed)
[facility] [address] [place]. [place] [address] [phone]       OFFICE FOLLOW UP NOTE  Tammie. 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     HPI:  Update, 03/01/2021, JM: Tammie Miles is here today for a stroke follow-up accompanied by Nira Conn, facility staff at [facility]. She is still a resident at [facility]. No current therapies. Reports stable aphasia and right spastic hemiparesis, no new stroke/TIA symptoms. 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  Continued frontal headaches. MR brain not completed which was ordered at prior visit. Unable to be scheduled for sleep evaluation (headaches more frequent in the AM). Questionable concerns of visual changes at prior visit - unsure if seen by ophthalmology after prior visit. Denies seizure activity. Remains on aspirin, atorvastatin, and keppra - denies side effects. BP today 148/95. Last A1C 6.3 and LDL 64  (10/31/20).  No further concerns at this time    History provided for reference purposes only. Update, 10/31/2020, Tammie Miles returns for 24-month stroke follow-up accompanied by her aunt, Pamala Hurry, and facility staff, Wells Guiles.  Long-term resident of [facility].  Reports continued aphasia and right spastic hemiparesis which has been stable.  Does complain of frontal headaches present over the past 4 months usually present upon awakening and gradually improves throughout the day. Does endorse vision changes ?blurred vision but unable to verify how long present. She has not been  seen by ophthalmologist. Unable to verify if photophobia or phonophobia present.  She does endorse insomnia and daytime fatigue. Denies any seizure activity. Per MAR, remains on aspirin and atorvastatin as well as Keppra 1000 mg twice daily.  Blood pressure today 129/89.  No further concerns at this time.  Update 05/01/2020 JM: Tammie Miles returns for 4-month stroke follow-up unaccompanied.  She continues to reside at [facility] rehabilitation [facility]  Residual aphasia and right spastic hemiparesis -denies worsening Able to ambulate short distance otherwise primarily wheelchair-bound Continues to experience bilateral knee pain which interferes with ambulation but is able to ambulate short distance with rolling walker Denies new stroke/TIA symptoms  Remains on Keppra 1000 mg twice daily -denies side effects Denies recent seizure activity  Reports compliance on aspirin 81 mg daily and atorvastatin 20 mg daily -denies side effects Blood pressure today initially elevated at 153/102 and on recheck 126/88 No concerns at this time  Update 10/28/2019 JM: Tammie Miles returns for stroke follow-up accompanied by her aunt. Stable from stroke standpoint with residual severe aphasia expressive> receptive and right spastic hemiparesis She is frustrated with her continued deficits and lack of therapy.  Primarily wheelchair-bound but is able to ambulate short distance Evaluated by ENT which showed mild subglottic stenosis but felt not impacting language difficulty Denies new or worsening stroke/TIA symptoms Remains on Keppra 1000 mg twice daily without seizure activity.  Denies side effects Continues on aspirin 81 mg daily without bleeding or bruising Continues on atorvastatin 20 mg daily without myalgias Blood pressure today 129/80 HTN and HLD managed  by facility Underwent right knee arthroscopy with meniscal debridement on 08/13/2019 Does report mild improvement of pain No further concerns  Update  06/28/2019 JM: Tammie Miles returns for follow-up with history of stroke as well as discussion regarding undergoing left knee replacement procedure.  She is accompanied today by her aunt. She continues to reside at [facility] with residual stroke deficits of right hemiparesis and aphasia which have been stable with patient reporting some improvement.  She has not been participating in therapies which she is frustrated about.  Per aunt, therapy discontinued due to lack of staff.  History of chronic knee pain previously undergoing right knee arthroplasty and has been having greater difficulty with left knee without benefit from injections.  Continues to follow with orthopedics and per aunt, concerns regarding recovery in setting of residual stroke deficits.  Ambulates short distance but limited due to residual right hemiparesis and L>R knee pain.  Continues on Keppra 1000 mg twice daily tolerating well without recurrent seizure activity.  She underwent cranioplasty on 01/22/2019 without complication.  Continues on aspirin 81 mg daily and atorvastatin 20 mg daily for secondary stroke prevention.  Blood pressure today 138/89.  No concerns at this time.  Update 11/26/2018 JM: Tammie Miles is being seen today for stroke follow-up accompanied by her aunt.  Prior follow-up appointments canceled/rescheduled due to COVID-19 safety restrictions and difficulty with scheduling virtual visit.  She continues to reside at [facility].  Residual stroke deficits of right hemiparesis and severe expressive > mild to moderate receptive aphasia.  She is ambulatory for very short distances and assistance needed.  Aunt states she was doing well with improvement of ambulation but this worsened after seizure activity in 05/2018 and lack of therapy.  She was admitted to ED on 05/20/2018 due to seizure activity but denies additional seizure since that time.  She continues on Keppra without side effects.  Referral placed at prior visit  to follow-up with neurosurgery but unfortunately due to difficulty getting a hold of patient/facility, appointment was never made. Skullflap located right abdomen.  She continues on aspirin 81 mg daily for secondary stroke prevention without side effects.  Blood pressure today satisfactory at 135/89.  Denies new or worsening stroke/TIA symptoms.  Initial visit 03/05/2018 JM: Patient is being seen today for hospital follow-up and is accompanied by her aunt.  She continues to reside at [facility].  She continues to receive PT/OT/ST. she continues to have LUE flaccid and expressive > receptive aphasia.  Severe expressive aphasia present with only being able to say "no" but is able to nod her head appropriately.  Mild to moderate receptive aphasia present.  She has had PEG tube removed and is currently obtaining all nutrition and pills orally.  She denies any complication in doing so and denies any aspiration or coughing with eating.  She is able to ambulate without assistance and denies any recent falls.  She needs minimal assistance with ADLs such as bathing and dressing.  She does feel as though she has been improving.  Continues on aspirin 81 mg without side effects of bleeding or bruising.  She continues on Keppra without recurrent seizures and has been tolerating well.  Blood pressure today satisfactory 135/82.  She denies follow-up appointment with neurosurgery since hospital discharge as recommended.  She does have skullcap placed right side of abdomen.  No further concerns at this time.  Denies new or worsening stroke/TIA symptoms.  Stroke admission: Tammie Miles is a 45 y.o.  Female with PMH of HTN on hypertensive medications, antidepressants, stimulants, and Xanax which are all prescribed by her primary physician.  Per discharge summary notes by Dr. Pearlean Brownie, after awakening from a nap on 11/26/2017 she was noted to not be acting correctly and was dropping things.  Her  15-year-old daughter called the father multiple times and when he went and checked on her he believes that she was drunk and she was obtunded but moving her arms.  The following morning on 11/27/2017, she remained obtunded and the husband called 911 where she was brought into the emergency room via EMS.  Initial CT head reviewed and showed large area of hemorrhagic left basal ganglia and left frontal lobe was noted with a 5.8 x 3.5 cm along with 15 mm shift from the left to right with early herniation.  Neurosurgery was immediately called for decompression.  Initial hematoma evacuation performed and after neuro worsening, hemicraniectomy was performed a few days later.  Initial insult felt to be ischemic with large hemorrhagic transformation and continued bleeding.  Unfortunately, patient had a prolonged hospitalization due to lack of family support and insurance for rehab.  Patient was started on Keppra for seizure prevention after episode of stiffness and tongue biting on 12/03/2017.  EEG did not show seizure activity.  Due to acute respiratory insufficiency, patient was intubated and did undergo tracheostomy on 12/09/2017.  Blood pressure initially found to be elevated on admission but stabilized throughout hospitalization.  Due to continued dysphasia, PEG tube placed on 12/09/2017 for nutritional support.  Aspirin 81 mg initiated for secondary stroke prevention.  Therapies recommended SNF but due to lack of bed placement, patient was transferred to CIR for ongoing PT/OT/ST.  During CIR stay, patient was decannulated on 01/09/2018.  Due to continued therapy needs, patient was discharged to [facility] rehabilitation center on 01/20/2018.      ROS:   N/A d/t language barrier  PMH:  Past Medical History:  Diagnosis Date   Acute posthemorrhagic anemia    hx of    ADHD    Anxiety    Aphasia    Aphasia following unspecified cerebrovascular disease    Arthritis    Depression    Gastrostomy  status (HCC)    Hemiplegia affecting right nondominant side (HCC)    Hypertension    Internal derangement of left knee    Seizures (HCC)    Stroke (HCC)    cerebral infarction due to occlusion or stenosis of left middle cerebral artery     PSH:  Past Surgical History:  Procedure Laterality Date   CRANIOPLASTY Left 01/22/2019   Procedure: CRANIOPLASTY;  Surgeon: Lisbeth Renshaw, MD;  Location: [facility] OR;  Service: Neurosurgery;  Laterality: Left;  CRANIOPLASTY   CRANIOTOMY Left 11/27/2017   Procedure: CRANIOTOMY for Evacuation of Intracranial Hemorrhage;  Surgeon: Lisbeth Renshaw, MD;  Location: [facility] OR;  Service: Neurosurgery;  Laterality: Left;  CRANIOTOMY for Evacuation of Intracranial Hemorrhage   CRANIOTOMY Left 11/28/2017   Procedure: Decompressive craniectomy;  Surgeon: Maeola Harman, MD;  Location: [facility] OR;  Service: Neurosurgery;  Laterality: Left;   ESOPHAGOGASTRODUODENOSCOPY N/A 12/09/2017   Procedure: ESOPHAGOGASTRODUODENOSCOPY (EGD);  Surgeon: Violeta Gelinas, MD;  Location: [facility] ENDOSCOPY;  Service: General;  Laterality: N/A;   KNEE ARTHROSCOPY WITH MENISCAL REPAIR Right 08/13/2019   Procedure: KNEE ARTHROSCOPY WITH PARTIAL MENISECTOMY, CHONDRAPLASTY MEDIAL LATERAL, PATELLA-FEMORAL;  Surgeon: Jodi Geralds, MD;  Location: [facility] ORS;  Service: Orthopedics;  Laterality: Right;   KNEE SURGERY Bilateral    PEG  PLACEMENT N/A 12/09/2017   Procedure: PERCUTANEOUS ENDOSCOPIC GASTROSTOMY (PEG) PLACEMENT;  Surgeon: Georganna Skeans, MD;  Location: [facility] ENDOSCOPY;  Service: General;  Laterality: N/A;    Social History:  Social History   Socioeconomic History   Marital status: Married    Spouse name: Not on file   Number of children: 1   Years of education: Not on file   Highest education level: Not on file  Occupational History   Not on file  Tobacco Use   Smoking status: Never   Smokeless tobacco: Never  Vaping Use   Vaping Use: Never used  Substance and Sexual Activity   Alcohol use:  Not Currently   Drug use: Not Currently   Sexual activity: Not Currently  Other Topics Concern   Not on file  Social History Narrative   Not on file   Social Determinants of Health   Financial Resource Strain: Not on file  Food Insecurity: Not on file  Transportation Needs: Not on file  Physical Activity: Not on file  Stress: Not on file  Social Connections: Not on file  Intimate Partner Violence: Not on file    Family History:  Family History  Problem Relation Age of Onset   Hypertension Mother    Hypertension Father    Breast cancer Maternal Aunt     Medications:   Current Outpatient Medications on File Prior to Visit  Medication Sig Dispense Refill   acetaminophen (TYLENOL) 500 MG tablet Take 1,000 mg by mouth 3 (three) times daily.     acetaminophen-codeine (TYLENOL #3) 300-30 MG tablet Take 1 tablet by mouth 2 (two) times daily as needed for moderate pain.     aspirin 81 MG chewable tablet Place 1 tablet (81 mg total) into feeding tube daily. (Patient taking differently: Chew 81 mg by mouth daily.)     atenolol (TENORMIN) 100 MG tablet Take 100 mg by mouth at bedtime.     atorvastatin (LIPITOR) 40 MG tablet Take 40 mg by mouth at bedtime.     Azelastine HCl 137 MCG/SPRAY SOLN Place 1 spray into the nose daily.     b complex vitamins tablet Take 1 tablet by mouth daily.     baclofen (LIORESAL) 10 MG tablet Take 10 mg by mouth in the morning and at bedtime.     cetirizine (ZYRTEC) 5 MG tablet Take 5 mg by mouth daily.     chlorhexidine (PERIDEX) 0.12 % solution SMARTSIG:By Mouth     diclofenac Sodium (VOLTAREN) 1 % GEL Apply topically as needed.     escitalopram (LEXAPRO) 10 MG tablet Take 10 mg by mouth daily.     levETIRAcetam (KEPPRA) 1000 MG tablet Take 1,000 mg by mouth every 12 (twelve) hours.     lidocaine (LMX) 4 % cream Apply 1 application topically See admin instructions. Apply to old trach site once daily, may use an additional 2 times daily as needed for pain  at old trach site     MELATONIN PO Take 8 mg by mouth at bedtime.      metFORMIN (GLUCOPHAGE-XR) 500 MG 24 hr tablet Take 500 mg by mouth daily.     mirtazapine (REMERON) 7.5 MG tablet Take 7.5 mg by mouth at bedtime.     naproxen sodium (ALEVE) 220 MG tablet Take 220 mg by mouth 2 (two) times daily as needed (pain).     polyethylene glycol (MIRALAX / GLYCOLAX) 17 g packet Take 17 g by mouth daily as needed for moderate constipation. Mix  in 4-8 oz liquid and drink     potassium chloride SA (KLOR-CON) 20 MEQ tablet Take 20 mEq by mouth every Monday, Wednesday, and Friday.     traZODone (DESYREL) 100 MG tablet Take 100 mg by mouth at bedtime.     No current facility-administered medications on file prior to visit.    Allergies:  No Known Allergies   Physical Exam  Vitals:   03/01/21 0823  BP: (!) 148/95  Pulse: 74   There is no height or weight on file to calculate BMI. No results found.  General: well developed, well nourished, pleasant African-American female, seated, in no evident distress Neck: supple with no carotid or supraclavicular bruits Cardiovascular: regular rate and rhythm, no murmurs Vascular:  Normal pulses all extremities  Neurologic Exam Mental Status: Awake and fully alert. Severe expressive aphasia with mild receptive aphasia.  No evidence of cognitive difficulties.  Able to say yes and no appropriately.  Mood and affect appropriate and cooperative with exam Cranial Nerves: Pupils equal, briskly reactive to light. Extraocular movements full without nystagmus. Visual fields blink to threat bilaterally.  Hearing intact. Facial sensation intact.  Right lower facial weakness. Motor: Full strength left upper and lower extremity RUE: 1/5 with spasticity greater in deltoid and fingers RLE: 3/5 ankle dorsiflexion weakness. 5/5 hip flexor, knee extension and flexion Sensory.: intact to touch , pinprick , position and vibratory sensation.  Coordination: Rapid alternating  movements normal except RUE. Finger-to-nose and heel-to-shin performed accurately on left side Gait and Station: Deferred Reflexes: 2+ right side and 1+ right side. Toes downgoing.       ASSESSMENT: LARAH SHOWERS is a 45 y.o. 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  Seizure activity 11/2017 placed on Keppra.  Cranioplasty on [id number] without complication.  Initial insult felt to be ischemic with large hemorrhagic transformation and continued bleeding.  Hospitalization complicated by acute respiratory insufficiency status post trach with decannulation, seizure, hypertensive emergency, anemia, and dysphagia s/p PEG. Vascular risk factors include HTN and depression.      PLAN:  Left MCA infarct with hemorrhagic transformation:  Residual deficits: Severe expressive aphasia, mild receptive aphasia and right spastic hemiparesis - stable  Increase Gabapentin from 300mg  BID to 300mg  TID for RUE pain. If no relief with increased Gabapentin dose, may consider increasing Baclofen dose - will defer to SNF. Continue aspirin 81 mg daily and atorvastatin 40 mg daily for secondary stroke prevention Discussed secondary stroke prevention measures and importance of close PCP follow-up for aggressive stroke risk factor management HTN: BP goal<130/90.  Stable. Managed by PCP/facility HLD: LDL goal<70.  Prior LDL 64 (10/31/20) on atorvastatin 40 mg daily. Pre-DM: A1c goal<7.  Prior A1c 6.3 (10/31/20). Request ongoing monitoring.   Chronic headaches:  Continued frontal headaches over the past 6 to 8 months Defer managed to SNF but possible dual benefit with increasing gabapentin dosage.  If headaches persist, SNF can consider increasing venlafaxine dosage With prior hx, recommend obtaining MR  brain w/wo contrast to rule out underlying abnormalities but not yet completed - requested facility contact GI to schedule Referral placed to [facility] for possible underlying sleep apnea contributing to morning headaches - will schedule today - requested SNF ensure aunt will be present at this visit CBC, CMP, B12 and TSH WNL  Seizure,  onset of stroke:  No recurrent events Continuation of Keppra 1000 mg twice daily for seizure prophylaxis Last seizure activity 05/2018    Follow up in 6 months or call earlier if needed   CC:  Raymondo Band, MD    I spent 37 minutes of face-to-face and non-face-to-face time with patient and caregiver.  This included previsit chart review, lab review, study review, order entry, electronic health record documentation, patient education regarding prior stroke, importance of managing stroke risk factors, residual deficits, post stroke seizure, chronic headaches and RUE pain and answered all other questions to patients satisfaction.   Frann Rider, AGNP-BC  [facility] [address] [place], [place] [address]  Phone [phone] Fax [phone] Note: This document was prepared with digital dictation and possible smart phrase technology. Any transcriptional errors that result from this process are unintentional.

## 2021-04-25 ENCOUNTER — Emergency Department (HOSPITAL_COMMUNITY): Payer: Medicare Other

## 2021-04-25 ENCOUNTER — Other Ambulatory Visit: Payer: Self-pay

## 2021-04-25 ENCOUNTER — Encounter (HOSPITAL_COMMUNITY): Payer: Self-pay

## 2021-04-25 ENCOUNTER — Emergency Department (HOSPITAL_COMMUNITY)
Admission: EM | Admit: 2021-04-25 | Discharge: 2021-04-25 | Disposition: A | Discharge status: Home / Self Care | Payer: Medicare Other | Attending: Emergency Medicine | Admitting: Emergency Medicine

## 2021-04-25 DIAGNOSIS — E876 Hypokalemia: Secondary | ICD-10-CM | POA: Insufficient documentation

## 2021-04-25 DIAGNOSIS — R220 Localized swelling, mass and lump, head: Secondary | ICD-10-CM | POA: Diagnosis not present

## 2021-04-25 DIAGNOSIS — R221 Localized swelling, mass and lump, neck: Secondary | ICD-10-CM | POA: Diagnosis not present

## 2021-04-25 DIAGNOSIS — M6281 Muscle weakness (generalized): Secondary | ICD-10-CM | POA: Diagnosis not present

## 2021-04-25 DIAGNOSIS — J029 Acute pharyngitis, unspecified: Secondary | ICD-10-CM | POA: Diagnosis not present

## 2021-04-25 DIAGNOSIS — Z20822 Contact with and (suspected) exposure to covid-19: Secondary | ICD-10-CM | POA: Diagnosis not present

## 2021-04-25 DIAGNOSIS — Z7982 Long term (current) use of aspirin: Secondary | ICD-10-CM | POA: Insufficient documentation

## 2021-04-25 DIAGNOSIS — R112 Nausea with vomiting, unspecified: Secondary | ICD-10-CM | POA: Diagnosis not present

## 2021-04-25 DIAGNOSIS — A419 Sepsis, unspecified organism: Secondary | ICD-10-CM

## 2021-04-25 DIAGNOSIS — R109 Unspecified abdominal pain: Secondary | ICD-10-CM | POA: Diagnosis present

## 2021-04-25 DIAGNOSIS — R0602 Shortness of breath: Secondary | ICD-10-CM | POA: Diagnosis not present

## 2021-04-25 LAB — COMPREHENSIVE METABOLIC PANEL
ALT: 44 U/L (ref 0–44)
AST: 43 U/L — ABNORMAL HIGH (ref 15–41)
Albumin: 4.3 g/dL (ref 3.5–5.0)
Alkaline Phosphatase: 79 U/L (ref 38–126)
Anion gap: 12 (ref 5–15)
BUN: 14 mg/dL (ref 6–20)
CO2: 23 mmol/L (ref 22–32)
Calcium: 9.4 mg/dL (ref 8.9–10.3)
Chloride: 101 mmol/L (ref 98–111)
Creatinine, Ser: 0.85 mg/dL (ref 0.44–1.00)
GFR, Estimated: >60 mL/min (ref >60)
Glucose, Bld: 100 mg/dL — ABNORMAL HIGH (ref 70–99)
Potassium: 3.1 mmol/L — ABNORMAL LOW (ref 3.5–5.1)
Sodium: 136 mmol/L (ref 135–145)
Total Bilirubin: 0.9 mg/dL (ref 0.3–1.2)
Total Protein: 8.8 g/dL — ABNORMAL HIGH (ref 6.5–8.1)

## 2021-04-25 LAB — CBC WITH DIFFERENTIAL/PLATELET
Abs Immature Granulocytes: 0.02 10*3/uL (ref 0.00–0.07)
Basophils Absolute: 0.1 10*3/uL (ref 0.0–0.1)
Basophils Relative: 1 %
Eosinophils Absolute: 0.4 10*3/uL (ref 0.0–0.5)
Eosinophils Relative: 4 %
HCT: 41.9 % (ref 36.0–46.0)
Hemoglobin: 13.3 g/dL (ref 12.0–15.0)
Immature Granulocytes: 0 %
Lymphocytes Relative: 34 %
Lymphs Abs: 3.1 10*3/uL (ref 0.7–4.0)
MCH: 26.9 pg (ref 26.0–34.0)
MCHC: 31.7 g/dL (ref 30.0–36.0)
MCV: 84.8 fL (ref 80.0–100.0)
Monocytes Absolute: 0.6 10*3/uL (ref 0.1–1.0)
Monocytes Relative: 7 %
Neutro Abs: 4.8 10*3/uL (ref 1.7–7.7)
Neutrophils Relative %: 54 %
Platelets: 279 10*3/uL (ref 150–400)
RBC: 4.94 MIL/uL (ref 3.87–5.11)
RDW: 14.6 % (ref 11.5–15.5)
WBC: 8.9 10*3/uL (ref 4.0–10.5)
nRBC: 0 % (ref 0.0–0.2)

## 2021-04-25 LAB — HCG, QUANTITATIVE, PREGNANCY: hCG, Beta Chain, Quant, S: <1 m[IU]/mL (ref <5)

## 2021-04-25 LAB — URINALYSIS, ROUTINE W REFLEX MICROSCOPIC
Bilirubin Urine: NEGATIVE
Glucose, UA: NEGATIVE mg/dL
Ketones, ur: NEGATIVE mg/dL
Nitrite: NEGATIVE
Protein, ur: 30 mg/dL — AB
Specific Gravity, Urine: 1.028 (ref 1.005–1.030)
Squamous Epithelial / HPF: >50 — ABNORMAL HIGH (ref 0–5)
WBC, UA: >50 WBC/hpf — ABNORMAL HIGH (ref 0–5)
pH: 6 (ref 5.0–8.0)

## 2021-04-25 LAB — RESP PANEL BY RT-PCR (FLU A&B, COVID) ARPGX2
Influenza A by PCR: NEGATIVE
Influenza B by PCR: NEGATIVE
SARS Coronavirus 2 by RT PCR: NEGATIVE

## 2021-04-25 LAB — TROPONIN I (HIGH SENSITIVITY): Troponin I (High Sensitivity): 4 ng/L (ref <18)

## 2021-04-25 LAB — LACTIC ACID, PLASMA: Lactic Acid, Venous: 2.3 mmol/L (ref 0.5–1.9)

## 2021-04-25 LAB — APTT: aPTT: 33 seconds (ref 24–36)

## 2021-04-25 LAB — PROTIME-INR
INR: 1 (ref 0.8–1.2)
Prothrombin Time: 13.5 seconds (ref 11.4–15.2)

## 2021-04-25 LAB — BRAIN NATRIURETIC PEPTIDE: B Natriuretic Peptide: 25 pg/mL (ref 0.0–100.0)

## 2021-04-25 MED ORDER — SODIUM CHLORIDE 0.9 % IV BOLUS
1000.0000 mL | Freq: Once | INTRAVENOUS | Status: AC
  Administered 2021-04-25: 1000 mL via INTRAVENOUS

## 2021-04-25 MED ORDER — ONDANSETRON HCL 4 MG/2ML IJ SOLN
4.0000 mg | Freq: Once | INTRAMUSCULAR | Status: AC
  Administered 2021-04-25: 4 mg via INTRAVENOUS
  Filled 2021-04-25: qty 2

## 2021-04-25 MED ORDER — HYDROMORPHONE HCL 1 MG/ML IJ SOLN
1.0000 mg | Freq: Once | INTRAMUSCULAR | Status: AC
  Administered 2021-04-25: 1 mg via INTRAVENOUS
  Filled 2021-04-25: qty 1

## 2021-04-25 MED ORDER — POTASSIUM CHLORIDE CRYS ER 20 MEQ PO TBCR
40.0000 meq | EXTENDED_RELEASE_TABLET | Freq: Once | ORAL | Status: AC
  Administered 2021-04-25: 40 meq via ORAL
  Filled 2021-04-25: qty 2

## 2021-04-25 MED ORDER — SODIUM CHLORIDE (PF) 0.9 % IJ SOLN
INTRAMUSCULAR | Status: AC
  Filled 2021-04-25: qty 50

## 2021-04-25 MED ORDER — IOHEXOL 300 MG/ML  SOLN
100.0000 mL | Freq: Once | INTRAMUSCULAR | Status: AC | PRN
  Administered 2021-04-25: 100 mL via INTRAVENOUS

## 2021-04-25 MED ORDER — IOHEXOL 350 MG/ML SOLN
80.0000 mL | Freq: Once | INTRAVENOUS | Status: AC | PRN
Start: 2021-04-25 — End: 2021-04-25
  Administered 2021-04-25: 80 mL via INTRAVENOUS

## 2021-04-25 MED ORDER — HYDROMORPHONE HCL 1 MG/ML IJ SOLN: 1.0000 mg | Freq: Once | INTRAMUSCULAR | Status: DC

## 2021-04-25 MED ORDER — OXYCODONE-ACETAMINOPHEN 5-325 MG PO TABS
1.0000 | ORAL_TABLET | Freq: Once | ORAL | Status: AC
  Administered 2021-04-25: 1 via ORAL
  Filled 2021-04-25: qty 1

## 2021-04-25 NOTE — ED Provider Triage Note (Signed)
Emergency Medicine Provider Triage Evaluation Note  Tammie Miles , a 46 y.o. female  was evaluated in triage.  Pt complains of right-sided facial swelling and severe throat pain for the last 2 days.  Patient has history of CVA with expressive aphasia therefore history is severely limited.  When asked if she is hurting anywhere else she points to her right knee and her abdomen.  She indicates that she has been vomiting and is urinating less than normal.  Denies any headache but does endorse some chest tightness without chest pain.  She is not anticoagulated..  Review of Systems  Positive: Sore throat, facial swelling, chest tightness, abdominal pain, nausea, vomiting, diarrhea Negative: Chest pain  Physical Exam  BP (!) 130/106 (BP Location: Left Arm)    Pulse 82    Temp 98.1 F (36.7 C) (Oral)    Resp 20    SpO2 91%  Gen:   Awake, no distress   Resp:  Normal effort  MSK:   Moves extremities without difficulty  Other:  Lungs CTA B.  Some right-sided facial and anterior neck swelling without crepitus.  Decreased dentition, mild sublingual tenderness.  Patient swallowing her secretions well.  Nasal congestion.  Right-sided residual deficits from prior CVA and expressive aphasia  Medical Decision Making  Medically screening exam initiated at 4:58 PM.  Appropriate orders placed.  Tammie Miles was informed that the remainder of the evaluation will be completed by another provider, this initial triage assessment does not replace that evaluation, and the importance of remaining in the ED until their evaluation is complete.  This chart was dictated using voice recognition software, Dragon. Despite the best efforts of this provider to proofread and correct errors, errors may still occur which can change documentation meaning.    Paris Lore, PA-C 04/25/21 1700

## 2021-04-25 NOTE — ED Notes (Signed)
Pt placed on bedpan

## 2021-04-25 NOTE — ED Notes (Signed)
[facility] called for pt 

## 2021-04-25 NOTE — Discharge Instructions (Signed)
Call your primary care doctor or specialist as discussed in the next 2-3 days.   Return immediately back to the ER if:  Your symptoms worsen within the next 12-24 hours. You develop new symptoms such as new fevers, persistent vomiting, new pain, shortness of breath, or new weakness or numbness, or if you have any other concerns.  

## 2021-04-25 NOTE — ED Triage Notes (Signed)
Pt BIB EMS from [facility]. Pt reports throat pain and facial swelling that began about 2 days ago. Doctor at facility sent her here for further evaluation. Pt has right sided deficits at baseline.

## 2021-04-25 NOTE — ED Provider Notes (Addendum)
[facility] DEPT Provider Note   CSN: [id number] Arrival date & time: 04/25/21  1609     History  Chief Complaint  Patient presents with   Sore Throat   Facial Swelling    Tammie Miles is a 46 y.o. female.  Patient presents to ER chief complaint of right-sided pain.  Apparently had right-sided neck tenderness and swelling at her facility.  However upon arrival here she is complaining of right-sided abdominal pain.  She is writhing in pain and nearly screaming.  Also complaining of some vomiting and diffuse right-sided pain.  Patient had a prior stroke the left are mostly nonverbal and she communicates with gestures.  When asked where her pain is she points up and down her right side.      Home Medications Prior to Admission medications   Medication Sig Start Date End Date Taking? Authorizing Provider  acetaminophen (TYLENOL) 500 MG tablet Take 1,000 mg by mouth 3 (three) times daily.    [provider]  acetaminophen-codeine (TYLENOL #3) 300-30 MG tablet Take 1 tablet by mouth 2 (two) times daily as needed for moderate pain.    [provider]  aspirin 81 MG chewable tablet Place 1 tablet (81 mg total) into feeding tube daily. Patient taking differently: Chew 81 mg by mouth daily. 02/04/19   Costella, Vista Mink, PA-C  atenolol (TENORMIN) 100 MG tablet Take 100 mg by mouth at bedtime.    [provider]  atorvastatin (LIPITOR) 40 MG tablet Take 40 mg by mouth at bedtime.    [provider]  Azelastine HCl 137 MCG/SPRAY SOLN Place 1 spray into the nose daily.    [provider]  b complex vitamins tablet Take 1 tablet by mouth daily.    [provider]  baclofen (LIORESAL) 10 MG tablet Take 10 mg by mouth in the morning and at bedtime.    [provider]  cetirizine (ZYRTEC) 5 MG tablet Take 5 mg by mouth daily.    [provider]  chlorhexidine (PERIDEX) 0.12 % solution  SMARTSIG:By Mouth 08/23/20   [provider]  diclofenac Sodium (VOLTAREN) 1 % GEL Apply topically as needed.    [provider]  escitalopram (LEXAPRO) 10 MG tablet Take 10 mg by mouth daily.    [provider]  gabapentin (NEURONTIN) 300 MG capsule Take 1 capsule (300 mg total) by mouth 3 (three) times daily. 03/01/21   Frann Rider, NP  levETIRAcetam (KEPPRA) 1000 MG tablet Take 1,000 mg by mouth every 12 (twelve) hours.    [provider]  lidocaine (LMX) 4 % cream Apply 1 application topically See admin instructions. Apply to old trach site once daily, may use an additional 2 times daily as needed for pain at old trach site    [provider]  MELATONIN PO Take 8 mg by mouth at bedtime.     [provider]  metFORMIN (GLUCOPHAGE-XR) 500 MG 24 hr tablet Take 500 mg by mouth daily. 09/01/20   [provider]  mirtazapine (REMERON) 7.5 MG tablet Take 7.5 mg by mouth at bedtime.    [provider]  naproxen sodium (ALEVE) 220 MG tablet Take 220 mg by mouth 2 (two) times daily as needed (pain).    [provider]  polyethylene glycol (MIRALAX / GLYCOLAX) 17 g packet Take 17 g by mouth daily as needed for moderate constipation. Mix in 4-8 oz liquid and drink    [provider]  potassium chloride SA (KLOR-CON) 20 MEQ tablet Take 20 mEq by mouth every Monday, Wednesday, and Friday.    [provider]  traZODone (DESYREL) 100 MG tablet Take 100 mg by mouth at bedtime.    [provider]      Allergies    Patient has no known allergies.    Review of Systems   Review of Systems  Unable to perform ROS: Patient nonverbal   Physical Exam Updated Vital Signs BP (!) 151/98    Pulse 92    Temp 98.1 F (36.7 C) (Oral)    Resp 19    Ht 5\' 3"  (1.6 m)    Wt 104 kg    SpO2 96%    BMI 40.61 kg/m  Physical Exam Constitutional:      General: She is not in acute distress.    Appearance: Normal  appearance.  HENT:     Head: Normocephalic.     Nose: Nose normal.  Eyes:     Extraocular Movements: Extraocular movements intact.  Neck:     Comments: Moderate cervical of adenopathy on the right side of neck. Cardiovascular:     Rate and Rhythm: Normal rate.  Pulmonary:     Effort: Pulmonary effort is normal.  Abdominal:     Tenderness: There is abdominal tenderness.     Comments: Mid abdominal tenderness some guarding present.  Patient rolling around in her bed and exam is difficult.  Musculoskeletal:        General: Normal range of motion.     Cervical back: Normal range of motion.  Neurological:     Mental Status: She is alert. Mental status is at baseline.     Comments: Patient has right upper extremity lower extremity weakness from prior stroke.  Speech is aphasic at baseline.    ED Results / Procedures / Treatments   Labs (all labs ordered are listed, but only abnormal results are displayed) Labs Reviewed  LACTIC ACID, PLASMA - Abnormal; Notable for the following components:      Result Value   Lactic Acid, Venous 2.3 (*)    All other components within normal limits  COMPREHENSIVE METABOLIC PANEL - Abnormal; Notable for the following components:   Potassium 3.1 (*)    Glucose, Bld 100 (*)    Total Protein 8.8 (*)    AST 43 (*)    All other components within normal limits  URINALYSIS, ROUTINE W REFLEX MICROSCOPIC - Abnormal; Notable for the following components:   APPearance CLOUDY (*)    Hgb urine dipstick MODERATE (*)    Protein, ur 30 (*)    Leukocytes,Ua LARGE (*)    WBC, UA >50 (*)    Bacteria, UA RARE (*)    Squamous Epithelial / LPF >50 (*)    All other components within normal limits  RESP PANEL BY RT-PCR (FLU A&B, COVID) ARPGX2  CULTURE, BLOOD (ROUTINE X 2)  CULTURE, BLOOD (ROUTINE X 2)  CBC WITH DIFFERENTIAL/PLATELET  PROTIME-INR  APTT  HCG, QUANTITATIVE, PREGNANCY  BRAIN NATRIURETIC PEPTIDE  LACTIC ACID, PLASMA  TROPONIN I (HIGH SENSITIVITY)     EKG EKG Interpretation  Date/Time:  Wednesday April 25 2021 19:34:34 EST Ventricular Rate:  81 PR Interval:  174 QRS Duration: 98 QT Interval:  421 QTC Calculation: 489 R Axis:   10 Text Interpretation: Sinus rhythm Nonspecific T abnormalities, anterior leads Borderline prolonged QT interval Confirmed by Thamas Jaegers ([id number]) on 04/25/2021 8:59:30 PM  Radiology DG Chest 1 View  Result  Date: 04/25/2021 CLINICAL DATA:  Throat and facial swelling. EXAM: CHEST  1 VIEW COMPARISON:  12/25/2017 FINDINGS: The cardio pericardial silhouette is enlarged. There is pulmonary vascular congestion without overt pulmonary edema. No overt airspace edema or focal consolidation. No substantial pleural effusion IMPRESSION: Enlargement of the cardiopericardial silhouette with pulmonary vascular congestion. Electronically Signed   By: Misty Stanley M.D.   On: 04/25/2021 18:30   CT SOFT TISSUE NECK W CONTRAST  Result Date: 04/25/2021 CLINICAL DATA:  Facial swelling EXAM: CT NECK WITH CONTRAST TECHNIQUE: Multidetector CT imaging of the neck was performed using the standard protocol following the bolus administration of intravenous contrast. RADIATION DOSE REDUCTION: This exam was performed according to the departmental dose-optimization program which includes automated exposure control, adjustment of the mA and/or kV according to patient size and/or use of iterative reconstruction technique. CONTRAST:  14mL OMNIPAQUE IOHEXOL 350 MG/ML SOLN COMPARISON:  06/07/2019 FINDINGS: PHARYNX AND LARYNX: The nasopharynx, oropharynx and larynx are normal. Visible portions of the oral cavity, tongue base and floor of mouth are normal. Normal epiglottis, vallecula and pyriform sinuses. The larynx is normal. No retropharyngeal abscess, effusion or lymphadenopathy. SALIVARY GLANDS: Hypoplastic left submandibular gland. Otherwise normal. THYROID: Normal. LYMPH NODES: No enlarged or abnormal density lymph nodes. VASCULAR: Major  cervical vessels are patent. LIMITED INTRACRANIAL: Old left MCA territory infarct. VISUALIZED ORBITS: Normal. MASTOIDS AND VISUALIZED PARANASAL SINUSES: No fluid levels or advanced mucosal thickening. No mastoid effusion. SKELETON: No bony spinal canal stenosis. No lytic or blastic lesions. UPPER CHEST: Clear. OTHER: None. IMPRESSION: 1. No acute abnormality of the neck. 2. Old left MCA territory infarct. Electronically Signed   By: Ulyses Jarred M.D.   On: 04/25/2021 22:36   CT Angio Chest PE W and/or Wo Contrast  Result Date: 04/25/2021 CLINICAL DATA:  Sore throat, facial swelling, right-sided abdominal pain, intermittent vomiting EXAM: CT ANGIOGRAPHY CHEST WITH CONTRAST TECHNIQUE: Multidetector CT imaging of the chest was performed using the standard protocol during bolus administration of intravenous contrast. Multiplanar CT image reconstructions and MIPs were obtained to evaluate the vascular anatomy. RADIATION DOSE REDUCTION: This exam was performed according to the departmental dose-optimization program which includes automated exposure control, adjustment of the mA and/or kV according to patient size and/or use of iterative reconstruction technique. CONTRAST:  44mL OMNIPAQUE IOHEXOL 350 MG/ML SOLN COMPARISON:  04/25/2021 FINDINGS: Cardiovascular: This is a technically adequate evaluation of the pulmonary vasculature. No filling defects or pulmonary emboli. The heart is unremarkable without pericardial effusion. No evidence of thoracic aortic aneurysm or dissection. Mediastinum/Nodes: No enlarged mediastinal, hilar, or axillary lymph nodes. Thyroid gland, trachea, and esophagus demonstrate no significant findings. Lungs/Pleura: No acute airspace disease, effusion, or pneumothorax. Central airways are widely patent. Upper Abdomen: Hepatic steatosis.  No acute upper abdominal finding. Musculoskeletal: No acute or destructive bony lesions. Reconstructed images demonstrate no additional findings. Review of  the MIP images confirms the above findings. IMPRESSION: 1. No evidence of pulmonary embolus. 2. No acute intrathoracic process. 3. Hepatic steatosis. Electronically Signed   By: Randa Ngo M.D.   On: 04/25/2021 22:30   CT Abdomen Pelvis W Contrast  Result Date: 04/25/2021 CLINICAL DATA:  Abdominal pain.  Vomiting. EXAM: CT ABDOMEN AND PELVIS WITH CONTRAST TECHNIQUE: Multidetector CT imaging of the abdomen and pelvis was performed using the standard protocol following bolus administration of intravenous contrast. RADIATION DOSE REDUCTION: This exam was performed according to the departmental dose-optimization program which includes automated exposure control, adjustment of the mA and/or kV according to patient size  and/or use of iterative reconstruction technique. CONTRAST:  167mL OMNIPAQUE IOHEXOL 300 MG/ML  SOLN COMPARISON:  05/20/2018 FINDINGS: Lower chest: Chest unremarkable. Hepatobiliary: The liver shows diffusely decreased attenuation suggesting fat deposition. No suspicious focal abnormality within the liver parenchyma. 2.1 x 2.0 cm subcapsular lesion in the lateral segment left liver (image 12/series 2) is new since prior studies. Lesion does show some nodular enhancement but pattern is not characteristic for hemangioma. This lesion cannot be definitively characterized but may be accentuated by focal fatty deposition. There is no evidence for gallstones, gallbladder wall thickening, or pericholecystic fluid. No intrahepatic or extrahepatic biliary dilation. Pancreas: No focal mass lesion. No dilatation of the main duct. No intraparenchymal cyst. No peripancreatic edema. Spleen: No splenomegaly. No focal mass lesion. Adrenals/Urinary Tract: No adrenal nodule or mass. Kidneys unremarkable. No evidence for hydroureter. The urinary bladder appears normal for the degree of distention. Stomach/Bowel: Stomach is unremarkable. No gastric wall thickening. No evidence of outlet obstruction. Duodenum is  normally positioned as is the ligament of Treitz. No small bowel wall thickening. No small bowel dilatation. The terminal ileum is normal. The appendix is normal. No gross colonic mass. No colonic wall thickening. Vascular/Lymphatic: There is mild atherosclerotic calcification of the abdominal aorta without aneurysm. There is no gastrohepatic or hepatoduodenal ligament lymphadenopathy. No retroperitoneal or mesenteric lymphadenopathy. No pelvic sidewall lymphadenopathy. Reproductive: The uterus is unremarkable.  There is no adnexal mass. Other: No intraperitoneal free fluid. Musculoskeletal: No worrisome lytic or sclerotic osseous abnormality. IMPRESSION: 1. No acute findings in the abdomen or pelvis. Specifically, no findings to explain the patient's history of nausea and vomiting. 2. 2.1 x 2.0 cm subcapsular lesion in the lateral segment left liver is new since prior studies. This cannot be definitively characterized but may be accentuated by focal fatty deposition. MRI of the abdomen with and without contrast recommended to further evaluate. This MRI can be deferred until the patient's clinical status stabilizes and the patient is best able to cooperate with positioning and breath holding. Study could be performed on an outpatient basis. 3. Aortic Atherosclerosis (ICD10-I70.0). Electronically Signed   By: Misty Stanley M.D.   On: 04/25/2021 20:23    Procedures Procedures    Medications Ordered in ED Medications  sodium chloride (PF) 0.9 % injection (has no administration in time range)  HYDROmorphone (DILAUDID) injection 1 mg (1 mg Intravenous Given 04/25/21 1935)  ondansetron (ZOFRAN) injection 4 mg (4 mg Intravenous Given 04/25/21 1934)  sodium chloride 0.9 % bolus 1,000 mL (0 mLs Intravenous Stopped 04/25/21 2126)  iohexol (OMNIPAQUE) 300 MG/ML solution 100 mL (100 mLs Intravenous Contrast Given 04/25/21 2003)  potassium chloride SA (KLOR-CON M) CR tablet 40 mEq (40 mEq Oral Given 04/25/21 2046)   oxyCODONE-acetaminophen (PERCOCET/ROXICET) 5-325 MG per tablet 1 tablet (1 tablet Oral Given 04/25/21 2046)  iohexol (OMNIPAQUE) 350 MG/ML injection 80 mL (80 mLs Intravenous Contrast Given 04/25/21 2215)    ED Course/ Medical Decision Making/ A&P                           Medical Decision Making Amount and/or Complexity of Data Reviewed Labs: ordered. Radiology: ordered.  Risk Prescription drug management.   Records show a visit to her outpatient clinic with history of aphasia on April 06, 2021.  Labs show normal white count normal hemoglobin.  Chemistry shows mild low potassium.  This was replaced here in the ER.  Urinalysis is grossly contaminated and not useful.  CT  abdomen pelvis pursued and is largely unremarkable.  Liver lesion seen, patient instructed to follow-up with her primary care doctor regarding this finding.  She was observed in the ER for several hours and symptoms appear to have improved and resolved.  She is calm and comfortable appearing now.  She does complain of some nausea and was given Zofran and IV fluid resuscitation.  O2 saturation dropped to about 90% at 1 point unclear why.  Given her symptoms CT angio pursued which was unremarkable for any pulmonary disease or pulmonary embolism.  CT of the soft tissue neck showed no pathology as well.  Vital signs are stabilized with saturation 93% to 95% on room air which is appropriate.  Patient presented with an acute complex issue unclear etiology.  Symptoms appear to be resolved and emergency work-up appears largely unremarkable for acute findings.  Recommending continued outpatient management and follow-up this week.  Advised return for worsening symptoms or any additional concerns.   Final Clinical Impression(s) / ED Diagnoses Final diagnoses:  Abdominal pain, unspecified abdominal location    Rx / DC Orders ED Discharge Orders     None         Luna Fuse, MD 04/25/21 2112    Luna Fuse,  MD 04/25/21 [phone]

## 2021-04-30 LAB — CULTURE, BLOOD (ROUTINE X 2)
Culture: NO GROWTH
Culture: NO GROWTH
Special Requests: ADEQUATE

## 2021-05-17 ENCOUNTER — Encounter: Payer: Self-pay | Admitting: Neurology

## 2021-05-17 ENCOUNTER — Other Ambulatory Visit: Payer: Self-pay

## 2021-05-17 ENCOUNTER — Ambulatory Visit (INDEPENDENT_AMBULATORY_CARE_PROVIDER_SITE_OTHER): Payer: Medicare Other | Admitting: Neurology

## 2021-05-17 VITALS — BP 153/84 | HR 60 | Ht 63.0 in | Wt 217.0 lb

## 2021-05-17 DIAGNOSIS — R29818 Other symptoms and signs involving the nervous system: Secondary | ICD-10-CM | POA: Diagnosis not present

## 2021-05-17 DIAGNOSIS — F5105 Insomnia due to other mental disorder: Secondary | ICD-10-CM

## 2021-05-17 DIAGNOSIS — F409 Phobic anxiety disorder, unspecified: Secondary | ICD-10-CM | POA: Insufficient documentation

## 2021-05-17 DIAGNOSIS — G4719 Other hypersomnia: Secondary | ICD-10-CM | POA: Diagnosis not present

## 2021-05-17 DIAGNOSIS — I69351 Hemiplegia and hemiparesis following cerebral infarction affecting right dominant side: Secondary | ICD-10-CM | POA: Diagnosis not present

## 2021-05-17 DIAGNOSIS — I69320 Aphasia following cerebral infarction: Secondary | ICD-10-CM

## 2021-05-17 DIAGNOSIS — F329 Major depressive disorder, single episode, unspecified: Secondary | ICD-10-CM

## 2021-05-17 NOTE — Patient Instructions (Signed)
Dear Dr. Leonie Man,  ?My Plan is to proceed with: ? ?1) HST , rule out OSA ?2) please refer for  depression therapy for insomnia. Trazodone is certainly an option, but not in the mix with lexapro.  ?3) discussion of sleep hygiene. No TV during the night, no caffeine after 4 Pm, and hot bath before bedtime  ? ?I would like to thank Raymondo Band, MD and Garvin Fila, Md ?

## 2021-05-17 NOTE — Progress Notes (Signed)
SLEEP MEDICINE CLINIC    Provider:  Larey Seat, MD  Primary Care Physician:  Raymondo Band, MD [place] [address]     Referring Provider: Garvin Fila, Md [address] [place],  [place] [address]          Chief Complaint according to patient   Patient presents with:     New Patient (Initial Visit)           HISTORY OF PRESENT ILLNESS:  Tammie Miles is a 46 y.o. year old 47 or [place] American female patient seen here as a referral on 05/17/2021 from Dr Leonie Man, MD stroke - for a consultation. .  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  She was intubated and tracheotomy was needed. Feeding tube was placed. She has developed right hemiparesis with spasticity and she is left aphasic also as a communication and comprehension of language seems to be intact. She is wheelchair bound and lives at a facility.   [facility].  She was referred 10-30-2021: based on a report of Insomnia- up all night and day, frustrated about her condition, very depressed by its limitations.      Family medical /sleep history: No other family member on CPAP with OSA, insomnia, sleep walkers.    Social history:  Patient is disabled and lives in a facility- Family status is single , with 2 children, aged 3 and 39. The patient used to work in an office.  Tobacco use none .  ETOH use none ,  Caffeine intake in form of Soda( PM 2 ). Regular exercise in form of PT/ OT.         Sleep habits are as follows: The patient's dinner time is between 5-6 PM. The patient goes to bed at no set time-anytime before midnight  PM and struggles to sleep until AM -continues to sleep for 3 hours, wakes for  bathroom  breaks, needs no help with those.  The preferred sleep position is prone, with the support of 3-5 pillows.  Dreams are reportedly rare.  7  AM is the usual rise time. The patient wakes up with an alarm.  She reports not feeling refreshed or restored in AM, with symptoms such as dry mouth, morning headaches, and residual fatigue.  Naps are taken after lunch, duration 60 minutes-  no more refreshing than nocturnal sleep.    Review of Systems: Out of a complete 14 system review, the patient complains of only the following symptoms, and all other reviewed systems are negative.:  Fatigue, snoring, fragmented sleep, Insomnia due to depression,anxiety, racing thoughts, worried.    How likely are you to doze in the following situations: 0 = not likely, 1 = slight chance, 2 = moderate chance, 3 = high chance   Sitting and Reading? Watching Television? Sitting inactive in a public place (theater or meeting)? As a passenger in a car for an hour without a break? Lying down in the afternoon when circumstances permit? Sitting and talking to someone? Sitting quietly after lunch without alcohol? In a car, while stopped for a few minutes in traffic?   Total = 16/ 24 points - no driving.   FSS endorsed at 24  Social History   Socioeconomic History   Marital status: Married    Spouse name: Not on file   Number of children: 1   Years of education: Not on file   Highest education level: Not on file  Occupational History   Not on file  Tobacco Use   Smoking status: Never   Smokeless tobacco: Never  Vaping Use   Vaping Use: Never used  Substance and Sexual Activity   Alcohol use: Not Currently   Drug use: Not Currently   Sexual activity: Not Currently  Other Topics Concern   Not on file  Social History Narrative   Not on file   Social Determinants of Health   Financial Resource Strain: Not on file  Food Insecurity: Not on file  Transportation Needs: Not on file  Physical Activity: Not  on file  Stress: Not on file  Social Connections: Not on file    Family History  Problem Relation Age of Onset   Hypertension Mother    Hypertension Father    Breast cancer Maternal Aunt     Past Medical History:  Diagnosis Date   Acute posthemorrhagic anemia    hx of    ADHD    Anxiety    Aphasia    Aphasia following unspecified cerebrovascular disease    Arthritis    Depression    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[place])    Hemiplegia affecting right nondominant side ([place])    Hypertension    Internal derangement of left knee    Seizures ([place])    Stroke ([place])    cerebral infarction due to occlusion or stenosis of left middle cerebral artery     Past Surgical History:  Procedure Laterality Date   CRANIOPLASTY Left 01/22/2019   Procedure: CRANIOPLASTY;  Surgeon: Consuella Lose, MD;  Location: [facility];  Service: Neurosurgery;  Laterality: Left;  CRANIOPLASTY   CRANIOTOMY Left 11/27/2017   Procedure: CRANIOTOMY for Evacuation of Intracranial Hemorrhage;  Surgeon: Consuella Lose, MD;  Location: [facility];  Service: Neurosurgery;  Laterality: Left;  CRANIOTOMY for Evacuation of Intracranial Hemorrhage   CRANIOTOMY Left 11/28/2017   Procedure: Decompressive craniectomy;  Surgeon: Erline Levine, MD;  Location: [facility];  Service: Neurosurgery;  Laterality: Left;   ESOPHAGOGASTRODUODENOSCOPY N/A 12/09/2017   Procedure: ESOPHAGOGASTRODUODENOSCOPY (EGD);  Surgeon: Georganna Skeans, MD;  Location[facility];  Service: General;  Laterality: N/A;   KNEE ARTHROSCOPY WITH MENISCAL REPAIR Right 08/13/2019   Procedure: KNEE ARTHROSCOPY WITH PARTIAL MENISECTOMY, CHONDRAPLASTY MEDIAL LATERAL, PATELLA-FEMORAL;  Surgeon: Dorna Leitz, MD;  Location: [facility];  Service: Orthopedics;  Laterality: Right;   KNEE SURGERY Bilateral    PEG PLACEMENT N/A 12/09/2017   Procedure: PERCUTANEOUS ENDOSCOPIC GASTROSTOMY (PEG) PLACEMENT;  Surgeon: Georganna Skeans, MD;  Location: [facility] ENDOSCOPY;  Service: General;  Laterality: N/A;      Current Outpatient Medications on File Prior to Visit  Medication Sig Dispense Refill   acetaminophen (TYLENOL) 500 MG tablet Take 1,000 mg by mouth 3 (three) times daily.     acetaminophen-codeine (TYLENOL #3) 300-30 MG tablet Take 1 tablet by mouth 2 (two) times daily as needed for moderate pain.     aspirin 81 MG chewable tablet Place 1 tablet (81 mg total) into feeding tube daily.     atenolol-chlorthalidone (TENORETIC) 100-25 MG tablet Take 1 tablet by mouth daily.     atorvastatin (LIPITOR) 40 MG tablet Take 40 mg by mouth at bedtime.     Azelastine HCl 137 MCG/SPRAY SOLN Place 1 spray into the nose  daily.     b complex vitamins tablet Take 1 tablet by mouth daily.     baclofen (LIORESAL) 10 MG tablet Take 10 mg by mouth in the morning and at bedtime.     cetirizine (ZYRTEC) 5 MG tablet Take 5 mg by mouth daily.     chlorhexidine (PERIDEX) 0.12 % solution SMARTSIG:By Mouth     diclofenac Sodium (VOLTAREN) 1 % GEL Apply topically as needed.     gabapentin (NEURONTIN) 300 MG capsule Take 1 capsule (300 mg total) by mouth 3 (three) times daily. 90 capsule 5   levETIRAcetam (KEPPRA) 1000 MG tablet Take 1,000 mg by mouth every 12 (twelve) hours.     lidocaine (LMX) 4 % cream Apply 1 application topically See admin instructions. Apply to old trach site once daily, may use an additional 2 times daily as needed for pain at old trach site     lisinopril (ZESTRIL) 2.5 MG tablet Take 2.5 mg by mouth daily.     MELATONIN PO Take 8 mg by mouth at bedtime.      metFORMIN (GLUCOPHAGE-XR) 500 MG 24 hr tablet Take 500 mg by mouth daily.     naproxen sodium (ALEVE) 220 MG tablet Take 220 mg by mouth 2 (two) times daily as needed (pain).     polyethylene glycol (MIRALAX / GLYCOLAX) 17 g packet Take 17 g by mouth daily as needed for moderate constipation. Mix in 4-8 oz liquid and drink     potassium chloride SA (KLOR-CON) 20 MEQ tablet Take 20 mEq by mouth every Monday, Wednesday, and Friday.      traZODone (DESYREL) 100 MG tablet Take 100 mg by mouth at bedtime.     Venlafaxine HCl 75 MG TB24 Take 1 tablet by mouth daily.     No current facility-administered medications on file prior to visit.    No Known Allergies  Physical exam:  Today's Vitals   05/17/21 0826  BP: (!) 153/84  Pulse: 60  Weight: 217 lb (98.4 kg)  Height: 5\' 3"  (1.6 m)   Body mass index is 38.44 kg/m.   Wt Readings from Last 3 Encounters:  05/17/21 217 lb (98.4 kg)  04/25/21 229 lb 4.5 oz (104 kg)  12/20/20 230 lb (104.3 kg)     Ht Readings from Last 3 Encounters:  05/17/21 5\' 3"  (1.6 m)  04/25/21 5\' 3"  (1.6 m)  12/20/20 5\' 3"  (1.6 m)      General: General: well developed, well nourished, pleasant African-American female, seated, in no evident distress Neck: supple with no carotid or supraclavicular bruits Cardiovascular: regular rate and rhythm, no murmurs Vascular:  Normal pulses all extremities  Neurologic Exam Mental Status: Awake and fully alert.  Severe expressive aphasia with mild receptive aphasia.  No evidence of cognitive difficulties.  Able to say yes and no appropriately.   Mood and affect appropriate and cooperative with exam Cranial Nerves: Pupils equal, briskly reactive to light. Extraocular movements full without nystagmus.  Visual fields blink to threat bilaterally.   Hearing intact. Facial sensation intact.  Right lower facial weakness Motor: spastic hemiparesis on the right- fisted hand,  extended  leg , unable to provide knee flexion or foot dorsal flexion.  Sensory.: intact to touch , pinprick , position and vibratory sensation.  Coordination: . Finger-to-nose  performed accurately on left side Gait and Station: Deferred, wheelchair bound.  Reflexes: 3+ right side and 1+ right side. Toes down going on the left, up going on the right .  Head: Normocephalic, status ost cranio tomy- Mallampati 3 plus ,  neck circumference:17 inches . Tracheostoma scar present.  Nasal  airflow  patent.  Retrognathia is not  seen.   Dental status: biological bottom, edentulous  upper- .    After spending a total time of  45  minutes face to face and additional time for physical and neurologic examination, review of laboratory studies,  personal review of imaging studies, reports and results of other testing and review of referral information / records as far as provided in visit, I have established the following assessments:  1)  Patient with severe remote MCI infarction followed by hemorrhagic conversion, seizures. Expessive aphasia and dominant hand hemiparesis are present. 2) patient is depressed and anxious by her own account, she may snore, but her problem has been insomnia out of mental health problems, reactive depression.  3) I am happy to screen for sleep apnea, but this will need a HST. Patient's degree of disability will not allow in lab testing.     Dear Dr. Leonie Man,  My Plan is to proceed with:  1) HST , rule out OSA 2) please refer for  depression therapy for insomnia. Trazodone is certainly an option, but not in the mix with lexapro.  3) discussion of sleep hygiene. No TV during the night, no caffeine after 4 Pm, and hot bath before bedtime   I would like to thank Raymondo Band, MD and Garvin Fila, Md [address] [place],  [place] [address] for allowing me to meet with and to take care of this pleasant patient.   In short, Tammie Miles is presenting with Insomnia, reactive to her disability degree and uncertainty of her future.  I plan to follow up  in case the HST is positive for an organic sleep disorder   Electronically signed by: Larey Seat, MD 05/17/2021 9:08 AM  [facility] Board certified by The [employer] of Sleep Medicine and Diplomate of the [employer] of Sleep Medicine. Board certified In Neurology through the [place], Fellow of the [employer] of Neurology. Medical  Director of [employer].

## 2021-06-11 ENCOUNTER — Telehealth: Payer: Self-pay

## 2021-06-11 NOTE — Telephone Encounter (Signed)
Called pt to schedule her sleep study but was unable to connect ?

## 2021-06-28 ENCOUNTER — Telehealth: Payer: Self-pay

## 2021-06-28 NOTE — Telephone Encounter (Signed)
We have attempted to call the patient 2 times to schedule sleep study. Patient has been unavailable at the phone numbers we have on file and has not returned our calls. If patient calls back we will schedule them for their sleep study. ° °

## 2021-08-29 NOTE — Progress Notes (Deleted)
[facility] [address] [place]. 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   HPI:  Update 08/29/2021 JM: Patient returns for 11-month stroke follow-up.  Continues to reside at [facility].  Overall stable.  Denies new stroke/TIA symptoms.  Residual aphasia and right spastic hemiparesis stable.  Frontal headaches ***.  Remains on gabapentin ***.  Evaluated by sleep specialist Dr. Vickey Huger 05/2021 who ordered HST for possible underlying sleep apnea but not yet scheduled (schedulers unable to contact pt to schedule).   Denies any seizure activity.  Compliant on Keppra 1000 mg twice daily, denies side effects.  Compliant on aspirin and atorvastatin, denies side effects. Blood pressure today *** Lab work ***        History provided for reference purposes only Update, 03/01/2021, JM: Tammie Miles is here today for a stroke follow-up accompanied by Kathie Rhodes, facility staff at [facility]. She is still a resident at [facility]. No current therapies. Reports stable aphasia and right spastic hemiparesis, no new stroke/TIA symptoms. 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  Continued frontal headaches. MR brain not completed which was ordered at prior visit. Unable to be scheduled for sleep evaluation (headaches more frequent in the AM). Questionable concerns of visual changes at prior visit - unsure if seen by ophthalmology after prior visit. Denies seizure activity. Remains on aspirin, atorvastatin, and keppra - denies side effects. BP today 148/95. Last A1C 6.3 and LDL 64  (10/31/20).  No further concerns at this  time  Update, 10/31/2020, Tammie Miles returns for 50-month stroke follow-up accompanied by her aunt, Britta Mccreedy, and facility staff, Lurena Joiner.  Long-term resident of [facility].  Reports continued aphasia and right spastic hemiparesis which has been stable.  Does complain of frontal headaches present over the past 4 months usually present upon awakening and gradually improves throughout the day. Does endorse vision changes ?blurred vision but unable to verify how long present. She has not been seen by ophthalmologist. Unable to verify if photophobia or phonophobia present.  She does endorse insomnia and daytime fatigue. Denies any seizure activity. Per MAR, remains on aspirin and atorvastatin as well as Keppra 1000 mg twice daily.  Blood pressure today 129/89.  No further concerns at this time.  Update 05/01/2020 JM: Tammie Miles returns for 41-month stroke follow-up unaccompanied.  She continues to reside at [facility]  Residual aphasia and right spastic hemiparesis -denies worsening Able to ambulate short distance otherwise primarily wheelchair-bound Continues to experience bilateral knee pain which interferes with ambulation but is able to ambulate short distance with rolling walker Denies new stroke/TIA symptoms  Remains on Keppra 1000 mg twice daily -denies side effects Denies recent seizure activity  Reports compliance on aspirin 81 mg daily and atorvastatin 20 mg daily -denies side effects Blood pressure today initially elevated at 153/102 and on recheck 126/88 No concerns at this time  Update 10/28/2019 JM: Tammie Miles returns for stroke follow-up accompanied by her aunt. Stable from stroke standpoint with residual severe aphasia expressive> receptive and right spastic hemiparesis She is frustrated with her continued deficits and  lack of therapy.  Primarily wheelchair-bound but is able to ambulate short distance Evaluated by ENT which showed mild subglottic stenosis  but felt not impacting language difficulty Denies new or worsening stroke/TIA symptoms Remains on Keppra 1000 mg twice daily without seizure activity.  Denies side effects Continues on aspirin 81 mg daily without bleeding or bruising Continues on atorvastatin 20 mg daily without myalgias Blood pressure today 129/80 HTN and HLD managed by facility Underwent right knee arthroscopy with meniscal debridement on 08/13/2019 Does report mild improvement of pain No further concerns  Update 06/28/2019 JM: Tammie Miles returns for follow-up with history of stroke as well as discussion regarding undergoing left knee replacement procedure.  She is accompanied today by her aunt. She continues to reside at [facility] with residual stroke deficits of right hemiparesis and aphasia which have been stable with patient reporting some improvement.  She has not been participating in therapies which she is frustrated about.  Per aunt, therapy discontinued due to lack of staff.  History of chronic knee pain previously undergoing right knee arthroplasty and has been having greater difficulty with left knee without benefit from injections.  Continues to follow with orthopedics and per aunt, concerns regarding recovery in setting of residual stroke deficits.  Ambulates short distance but limited due to residual right hemiparesis and L>R knee pain.  Continues on Keppra 1000 mg twice daily tolerating well without recurrent seizure activity.  She underwent cranioplasty on 01/22/2019 without complication.  Continues on aspirin 81 mg daily and atorvastatin 20 mg daily for secondary stroke prevention.  Blood pressure today 138/89.  No concerns at this time.  Update 11/26/2018 JM: Tammie Miles is being seen today for stroke follow-up accompanied by her aunt.  Prior follow-up appointments canceled/rescheduled due to COVID-19 safety restrictions and difficulty with scheduling virtual visit.  She continues to reside at [facility].   Residual stroke deficits of right hemiparesis and severe expressive > mild to moderate receptive aphasia.  She is ambulatory for very short distances and assistance needed.  Aunt states she was doing well with improvement of ambulation but this worsened after seizure activity in 05/2018 and lack of therapy.  She was admitted to ED on 05/20/2018 due to seizure activity but denies additional seizure since that time.  She continues on Keppra without side effects.  Referral placed at prior visit to follow-up with neurosurgery but unfortunately due to difficulty getting a hold of patient/facility, appointment was never made. Skullflap located right abdomen.  She continues on aspirin 81 mg daily for secondary stroke prevention without side effects.  Blood pressure today satisfactory at 135/89.  Denies new or worsening stroke/TIA symptoms.  Initial visit 03/05/2018 JM: Patient is being seen today for hospital follow-up and is accompanied by her aunt.  She continues to reside at [facility].  She continues to receive PT/OT/ST. she continues to have LUE flaccid and expressive > receptive aphasia.  Severe expressive aphasia present with only being able to say "no" but is able to nod her head appropriately.  Mild to moderate receptive aphasia present.  She has had PEG tube removed and is currently obtaining all nutrition and pills orally.  She denies any complication in doing so and denies any aspiration or coughing with eating.  She is able to ambulate without assistance and denies any recent falls.  She needs minimal assistance with ADLs such as bathing and dressing.  She does feel as though she has been improving.  Continues on aspirin 81  mg without side effects of bleeding or bruising.  She continues on Keppra without recurrent seizures and has been tolerating well.  Blood pressure today satisfactory 135/82.  She denies follow-up appointment with neurosurgery since hospital discharge as  recommended.  She does have skullcap placed right side of abdomen.  No further concerns at this time.  Denies new or worsening stroke/TIA symptoms.  Stroke admission: Tammie Miles is a 46 y.o. Female with PMH of HTN on hypertensive medications, antidepressants, stimulants, and Xanax which are all prescribed by her primary physician.  Per discharge summary notes by Dr. Pearlean BrownieSethi, after awakening from a nap on 11/26/2017 she was noted to not be acting correctly and was dropping things.  Her 46-year-old daughter called the father multiple times and when he went and checked on her he believes that she was drunk and she was obtunded but moving her arms.  The following morning on 11/27/2017, she remained obtunded and the husband called 911 where she was brought into the emergency room via EMS.  Initial CT head reviewed and showed large area of hemorrhagic left basal ganglia and left frontal lobe was noted with a 5.8 x 3.5 cm along with 15 mm shift from the left to right with early herniation.  Neurosurgery was immediately called for decompression.  Initial hematoma evacuation performed and after neuro worsening, hemicraniectomy was performed a few days later.  Initial insult felt to be ischemic with large hemorrhagic transformation and continued bleeding.  Unfortunately, patient had a prolonged hospitalization due to lack of family support and insurance for rehab.  Patient was started on Keppra for seizure prevention after episode of stiffness and tongue biting on 12/03/2017.  EEG did not show seizure activity.  Due to acute respiratory insufficiency, patient was intubated and did undergo tracheostomy on 12/09/2017.  Blood pressure initially found to be elevated on admission but stabilized throughout hospitalization.  Due to continued dysphasia, PEG tube placed on 12/09/2017 for nutritional support.  Aspirin 81 mg initiated for secondary stroke prevention.  Therapies recommended SNF but due to lack of bed placement, patient was  transferred to CIR for ongoing PT/OT/ST.  During CIR stay, patient was decannulated on 01/09/2018.  Due to continued therapy needs, patient [facility] on 01/20/2018.      ROS:   N/A d/t language barrier  PMH:  Past Medical History:  Diagnosis Date   Acute posthemorrhagic anemia    hx of    ADHD    Anxiety    Aphasia    Aphasia following unspecified cerebrovascular disease    Arthritis    Depression    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HCC)    Hemiplegia affecting right nondominant side (HCC)    Hypertension    Internal derangement of left knee    Seizures (HCC)    Stroke (HCC)    cerebral infarction due to occlusion or stenosis of left middle cerebral artery     PSH:  Past Surgical History:  Procedure Laterality Date   CRANIOPLASTY Left 01/22/2019   Procedure: CRANIOPLASTY;  Surgeon: Lisbeth RenshawNundkumar, Neelesh, MD;  [facility];  Service: Neurosurgery;  Laterality: Left;  CRANIOPLASTY   CRANIOTOMY Left 11/27/2017   Procedure: CRANIOTOMY for Evacuation of Intracranial Hemorrhage;  Surgeon: Lisbeth RenshawNundkumar, Neelesh, MD;  [facility];  Service: Neurosurgery;  Laterality: Left;  CRANIOTOMY for Evacuation of Intracranial Hemorrhage   CRANIOTOMY Left 11/28/2017   Procedure: Decompressive craniectomy;  Surgeon: Maeola HarmanStern, Joseph, MD;  [facility];  Service: Neurosurgery;  Laterality: Left;   ESOPHAGOGASTRODUODENOSCOPY N/A 12/09/2017   Procedure: ESOPHAGOGASTRODUODENOSCOPY (EGD);  Surgeon: Violeta Gelinas, MD;  Location: [facility] ENDOSCOPY;  Service: General;  Laterality: N/A;   KNEE ARTHROSCOPY WITH MENISCAL REPAIR Right 08/13/2019   Procedure: KNEE ARTHROSCOPY WITH PARTIAL MENISECTOMY, CHONDRAPLASTY MEDIAL LATERAL, PATELLA-FEMORAL;  Surgeon: Jodi Geralds, MD;  Location: [facility] ORS;  Service: Orthopedics;  Laterality: Right;   KNEE SURGERY Bilateral    PEG PLACEMENT N/A 12/09/2017   Procedure: PERCUTANEOUS ENDOSCOPIC GASTROSTOMY (PEG) PLACEMENT;  Surgeon:  Violeta Gelinas, MD;  Location: [facility] ENDOSCOPY;  Service: General;  Laterality: N/A;    Social History:  Social History   Socioeconomic History   Marital status: Married    Spouse name: Not on file   Number of children: 1   Years of education: Not on file   Highest education level: Not on file  Occupational History   Not on file  Tobacco Use   Smoking status: Never   Smokeless tobacco: Never  Vaping Use   Vaping Use: Never used  Substance and Sexual Activity   Alcohol use: Not Currently   Drug use: Not Currently   Sexual activity: Not Currently  Other Topics Concern   Not on file  Social History Narrative   Not on file   Social Determinants of Health   Financial Resource Strain: Not on file  Food Insecurity: Not on file  Transportation Needs: Not on file  Physical Activity: Not on file  Stress: Not on file  Social Connections: Not on file  Intimate Partner Violence: Not on file    Family History:  Family History  Problem Relation Age of Onset   Hypertension Mother    Hypertension Father    Breast cancer Maternal Aunt     Medications:   Current Outpatient Medications on File Prior to Visit  Medication Sig Dispense Refill   acetaminophen (TYLENOL) 500 MG tablet Take 1,000 mg by mouth 3 (three) times daily.     acetaminophen-codeine (TYLENOL #3) 300-30 MG tablet Take 1 tablet by mouth 2 (two) times daily as needed for moderate pain.     aspirin 81 MG chewable tablet Place 1 tablet (81 mg total) into feeding tube daily.     atenolol-chlorthalidone (TENORETIC) 100-25 MG tablet Take 1 tablet by mouth daily.     atorvastatin (LIPITOR) 40 MG tablet Take 40 mg by mouth at bedtime.     Azelastine HCl 137 MCG/SPRAY SOLN Place 1 spray into the nose daily.     b complex vitamins tablet Take 1 tablet by mouth daily.     baclofen (LIORESAL) 10 MG tablet Take 10 mg by mouth in the morning and at bedtime.     cetirizine (ZYRTEC) 5 MG tablet Take 5 mg by mouth daily.      chlorhexidine (PERIDEX) 0.12 % solution SMARTSIG:By Mouth     diclofenac Sodium (VOLTAREN) 1 % GEL Apply topically as needed.     gabapentin (NEURONTIN) 300 MG capsule Take 1 capsule (300 mg total) by mouth 3 (three) times daily. 90 capsule 5   levETIRAcetam (KEPPRA) 1000 MG tablet Take 1,000 mg by mouth every 12 (twelve) hours.     lidocaine (LMX) 4 % cream Apply 1 application topically See admin instructions. Apply to old trach site once daily, may use an additional 2 times daily as needed for pain at old trach site     lisinopril (ZESTRIL) 2.5 MG tablet Take 2.5 mg by mouth daily.     MELATONIN PO Take 8 mg  by mouth at bedtime.      metFORMIN (GLUCOPHAGE-XR) 500 MG 24 hr tablet Take 500 mg by mouth daily.     naproxen sodium (ALEVE) 220 MG tablet Take 220 mg by mouth 2 (two) times daily as needed (pain).     polyethylene glycol (MIRALAX / GLYCOLAX) 17 g packet Take 17 g by mouth daily as needed for moderate constipation. Mix in 4-8 oz liquid and drink     potassium chloride SA (KLOR-CON) 20 MEQ tablet Take 20 mEq by mouth every Monday, Wednesday, and Friday.     traZODone (DESYREL) 100 MG tablet Take 100 mg by mouth at bedtime.     Venlafaxine HCl 75 MG TB24 Take 1 tablet by mouth daily.     No current facility-administered medications on file prior to visit.    Allergies:  No Known Allergies   Physical Exam  There were no vitals filed for this visit.  There is no height or weight on file to calculate BMI. No results found.  General: well developed, well nourished, pleasant African-American female, seated, in no evident distress Neck: supple with no carotid or supraclavicular bruits Cardiovascular: regular rate and rhythm, no murmurs Vascular:  Normal pulses all extremities  Neurologic Exam Mental Status: Awake and fully alert. Severe expressive aphasia with mild receptive aphasia.  No evidence of cognitive difficulties.  Able to say yes and no appropriately.  Mood and affect  appropriate and cooperative with exam Cranial Nerves: Pupils equal, briskly reactive to light. Extraocular movements full without nystagmus. Visual fields blink to threat bilaterally.  Hearing intact. Facial sensation intact.  Right lower facial weakness. Motor: Full strength left upper and lower extremity RUE: 1/5 with spasticity greater in deltoid and fingers RLE: 3/5 ankle dorsiflexion weakness. 5/5 hip flexor, knee extension and flexion Sensory.: intact to touch , pinprick , position and vibratory sensation.  Coordination: Rapid alternating movements normal except RUE. Finger-to-nose and heel-to-shin performed accurately on left side Gait and Station: Deferred Reflexes: 2+ right side and 1+ right side. Toes downgoing.       ASSESSMENT: Tammie Miles is a 46 y.o. 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  Seizure activity 11/2017 placed on Keppra.  Cranioplasty on 01/22/2019 without complication.  Initial insult felt to be ischemic with large hemorrhagic transformation and continued bleeding.  Hospitalization complicated by acute respiratory insufficiency status post trach with decannulation, seizure, hypertensive emergency, anemia, and dysphagia s/p PEG. Vascular risk factors include HTN and depression.      PLAN:  Left MCA infarct with hemorrhagic transformation:  Residual deficits: Severe expressive aphasia, mild receptive aphasia and right spastic hemiparesis - stable  Increase Gabapentin from 300mg  BID to 300mg  TID for RUE pain. If no relief with increased Gabapentin dose, may consider increasing Baclofen dose - will defer to SNF. Continue aspirin 81 mg daily and atorvastatin 40 mg daily for secondary stroke prevention Discussed secondary stroke prevention measures  and importance of close PCP follow-up for aggressive stroke risk factor management including BP goal<130/90, HLD with LDL goal<70 and pre-DM with A1c.<7  Stroke labs 10/2020: LDL 64, A1c 6.3   Chronic headaches:  Continued frontal headaches over the past 6 to 8 months Defer managed to SNF but possible dual benefit with increasing gabapentin dosage.  If headaches persist, SNF can consider increasing  venlafaxine dosage Ordered MR brain 10/2020 which still has not been completed - as almost 1 year ago and headaches have been stable, okay to hold off on pursuing imaging at this time. Evaluated by Dr. Vickey Huger 05/2021 - needs to be scheduled for HST *** CBC, CMP, B12 and TSH WNL  Seizure, onset of stroke:  No recurrent events Continuation of Keppra 1000 mg twice daily for seizure prophylaxis Last seizure activity 05/2018    Follow up in 6 months or call earlier if needed   CC:  Karna Dupes, MD    I spent 37 minutes of face-to-face and non-face-to-face time with patient and caregiver.  This included previsit chart review, lab review, study review, order entry, electronic health record documentation, patient education regarding prior stroke, importance of managing stroke risk factors, residual deficits, post stroke seizure, chronic headaches and RUE pain and answered all other questions to patients satisfaction.   Ihor Austin, AGNP-BC  [facility] [address] [place], [place] [address]  Phone [phone] Fax [phone] Note: This document was prepared with digital dictation and possible smart phrase technology. Any transcriptional errors that result from this process are unintentional.

## 2021-08-30 ENCOUNTER — Ambulatory Visit: Payer: Medicare Other | Admitting: Adult Health

## 2021-08-30 ENCOUNTER — Encounter: Payer: Self-pay | Admitting: Adult Health

## 2021-10-31 ENCOUNTER — Ambulatory Visit (INDEPENDENT_AMBULATORY_CARE_PROVIDER_SITE_OTHER): Payer: Medicare Other | Admitting: Adult Health

## 2021-10-31 ENCOUNTER — Telehealth: Payer: Self-pay | Admitting: Neurology

## 2021-10-31 ENCOUNTER — Encounter: Payer: Self-pay | Admitting: Adult Health

## 2021-10-31 VITALS — BP 164/96 | HR 76 | Ht 63.0 in | Wt 217.0 lb

## 2021-10-31 DIAGNOSIS — I611 Nontraumatic intracerebral hemorrhage in hemisphere, cortical: Secondary | ICD-10-CM

## 2021-10-31 DIAGNOSIS — G44229 Chronic tension-type headache, not intractable: Secondary | ICD-10-CM

## 2021-10-31 DIAGNOSIS — I639 Cerebral infarction, unspecified: Secondary | ICD-10-CM | POA: Diagnosis not present

## 2021-10-31 DIAGNOSIS — R569 Unspecified convulsions: Secondary | ICD-10-CM | POA: Diagnosis not present

## 2021-10-31 NOTE — Progress Notes (Signed)
[facility] [address] [place]. 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  Continues to reside at [facility].  Stable from stroke standpoint without new stroke/TIA symptoms.  Residual right spastic hemiparesis and aphasia stable.  Reports continued right arm pain as well as frequent headaches.  Increase gabapentin dosage at prior visit but doesn't seem this made much different.  She is also on baclofen 10mg  BID, venlafaxine 150 mg daily, and Tylenol with codeine.  Describes pain is more of an achy sensation.  Denies any seizure activity, remains on Keppra, denies side effects.  Compliant on aspirin and atorvastatin.  Blood pressure elevated at today's visit but typically stable at SNF.  Was seen by Dr. 3/9 for possible underlying sleep apnea, office has been unable to contact SNF to schedule sleep study.  No further concerns at this time     History provided for reference purposes only Update, 03/01/2021, JM: Tammie Miles is here today for a stroke follow-up accompanied by Yetta Barre, facility staff at [facility]. She is still a resident at [facility]. No current therapies. Reports stable aphasia and right spastic hemiparesis, no new stroke/TIA symptoms. 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  Continued frontal headaches. MR brain not completed which was ordered at  prior visit. Unable to be scheduled for sleep evaluation (headaches more frequent in the AM). Questionable concerns of visual changes at prior visit - unsure if seen by ophthalmology after prior visit. Denies seizure activity. Remains on aspirin, atorvastatin, and keppra - denies side effects. BP today 148/95. Last A1C 6.3 and LDL 64  (10/31/20).  No further concerns at this time   Update, 10/31/2020, Tammie Miles returns for 9-month stroke follow-up accompanied by her aunt, 8-month, and facility staff, Britta Mccreedy.  Long-term resident of [facility].  Reports continued aphasia and right spastic hemiparesis which has been stable.  Does complain of frontal headaches present over the past 4 months usually present upon awakening and gradually improves throughout the day. Does endorse vision changes ?blurred vision but unable to verify how long present. She has not been seen by ophthalmologist. Unable to verify if photophobia or phonophobia present.  She does endorse insomnia and daytime fatigue. Denies any seizure activity. Per MAR, remains on aspirin and atorvastatin as well as Keppra 1000 mg twice daily.  Blood pressure today 129/89.  No further concerns at this time.  Update 05/01/2020 JM: Tammie Miles returns for 65-month stroke follow-up unaccompanied.  She continues to reside at [facility]  Residual aphasia and right spastic hemiparesis -denies worsening Able to ambulate short distance otherwise primarily wheelchair-bound Continues to experience bilateral knee pain which interferes with ambulation but is able to ambulate short distance with rolling walker Denies new stroke/TIA symptoms  Remains on Keppra 1000 mg twice daily -  denies side effects Denies recent seizure activity  Reports compliance on aspirin 81 mg daily and atorvastatin 20 mg daily -denies side effects Blood pressure today initially elevated at 153/102 and on recheck 126/88 No concerns at this time  Update  10/28/2019 JM: Tammie Miles returns for stroke follow-up accompanied by her aunt. Stable from stroke standpoint with residual severe aphasia expressive> receptive and right spastic hemiparesis She is frustrated with her continued deficits and lack of therapy.  Primarily wheelchair-bound but is able to ambulate short distance Evaluated by ENT which showed mild subglottic stenosis but felt not impacting language difficulty Denies new or worsening stroke/TIA symptoms Remains on Keppra 1000 mg twice daily without seizure activity.  Denies side effects Continues on aspirin 81 mg daily without bleeding or bruising Continues on atorvastatin 20 mg daily without myalgias Blood pressure today 129/80 HTN and HLD managed by facility Underwent right knee arthroscopy with meniscal debridement on 08/13/2019 Does report mild improvement of pain No further concerns  Update 06/28/2019 JM: Tammie Miles returns for follow-up with history of stroke as well as discussion regarding undergoing left knee replacement procedure.  She is accompanied today by her aunt. She continues to reside at [facility] with residual stroke deficits of right hemiparesis and aphasia which have been stable with patient reporting some improvement.  She has not been participating in therapies which she is frustrated about.  Per aunt, therapy discontinued due to lack of staff.  History of chronic knee pain previously undergoing right knee arthroplasty and has been having greater difficulty with left knee without benefit from injections.  Continues to follow with orthopedics and per aunt, concerns regarding recovery in setting of residual stroke deficits.  Ambulates short distance but limited due to residual right hemiparesis and L>R knee pain.  Continues on Keppra 1000 mg twice daily tolerating well without recurrent seizure activity.  She underwent cranioplasty on 01/22/2019 without complication.  Continues on aspirin 81 mg daily and atorvastatin 20 mg daily  for secondary stroke prevention.  Blood pressure today 138/89.  No concerns at this time.  Update 11/26/2018 JM: Tammie Miles is being seen today for stroke follow-up accompanied by her aunt.  Prior follow-up appointments canceled/rescheduled due to COVID-19 safety restrictions and difficulty with scheduling virtual visit.  She continues to reside at [facility].  Residual stroke deficits of right hemiparesis and severe expressive > mild to moderate receptive aphasia.  She is ambulatory for very short distances and assistance needed.  Aunt states she was doing well with improvement of ambulation but this worsened after seizure activity in 05/2018 and lack of therapy.  She was admitted to ED on 05/20/2018 due to seizure activity but denies additional seizure since that time.  She continues on Keppra without side effects.  Referral placed at prior visit to follow-up with neurosurgery but unfortunately due to difficulty getting a hold of patient/facility, appointment was never made. Skullflap located right abdomen.  She continues on aspirin 81 mg daily for secondary stroke prevention without side effects.  Blood pressure today satisfactory at 135/89.  Denies new or worsening stroke/TIA symptoms.  Initial visit 03/05/2018 JM: Patient is being seen today for hospital follow-up and is accompanied by her aunt.  She continues to reside at [facility].  She continues to receive PT/OT/ST. she continues to have LUE flaccid and expressive > receptive aphasia.  Severe expressive aphasia present with only being able to say "no" but is able to nod her head appropriately.  Mild to moderate  receptive aphasia present.  She has had PEG tube removed and is currently obtaining all nutrition and pills orally.  She denies any complication in doing so and denies any aspiration or coughing with eating.  She is able to ambulate without assistance and denies any recent falls.  She needs minimal  assistance with ADLs such as bathing and dressing.  She does feel as though she has been improving.  Continues on aspirin 81 mg without side effects of bleeding or bruising.  She continues on Keppra without recurrent seizures and has been tolerating well.  Blood pressure today satisfactory 135/82.  She denies follow-up appointment with neurosurgery since hospital discharge as recommended.  She does have skullcap placed right side of abdomen.  No further concerns at this time.  Denies new or worsening stroke/TIA symptoms.  Stroke admission: Tammie Miles is a 46 y.o. Female with PMH of HTN on hypertensive medications, antidepressants, stimulants, and Xanax which are all prescribed by her primary physician.  Per discharge summary notes by Dr. Pearlean Brownie, after awakening from a nap on 11/26/2017 she was noted to not be acting correctly and was dropping things.  Her 15-year-old daughter called the father multiple times and when he went and checked on her he believes that she was drunk and she was obtunded but moving her arms.  The following morning on 11/27/2017, she remained obtunded and the husband called 911 where she was brought into the emergency room via EMS.  Initial CT head reviewed and showed large area of hemorrhagic left basal ganglia and left frontal lobe was noted with a 5.8 x 3.5 cm along with 15 mm shift from the left to right with early herniation.  Neurosurgery was immediately called for decompression.  Initial hematoma evacuation performed and after neuro worsening, hemicraniectomy was performed a few days later.  Initial insult felt to be ischemic with large hemorrhagic transformation and continued bleeding.  Unfortunately, patient had a prolonged hospitalization due to lack of family support and insurance for rehab.  Patient was started on Keppra for seizure prevention after episode of stiffness and tongue biting on 12/03/2017.  EEG did not show seizure activity.  Due to acute respiratory insufficiency,  patient was intubated and did undergo tracheostomy on 12/09/2017.  Blood pressure initially found to be elevated on admission but stabilized throughout hospitalization.  Due to continued dysphasia, PEG tube placed on 12/09/2017 for nutritional support.  Aspirin 81 mg initiated for secondary stroke prevention.  Therapies recommended SNF but due to lack of bed placement, patient was transferred to CIR for ongoing PT/OT/ST.  During CIR stay, patient was decannulated on 01/09/2018.  Due to continued therapy needs, patient was discharged to [facility] on 01/20/2018.      ROS:   N/A d/t language barrier  PMH:  Past Medical History:  Diagnosis Date   Acute posthemorrhagic anemia    hx of    ADHD    Anxiety    Aphasia    Aphasia following unspecified cerebrovascular disease    Arthritis    Depression    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HCC)    Hemiplegia affecting right nondominant side (HCC)    Hypertension    Internal derangement of left knee    Seizures (HCC)    Stroke (HCC)    cerebral infarction due to occlusion or stenosis of left middle cerebral artery     PSH:  Past Surgical History:  Procedure Laterality Date   CRANIOPLASTY Left 01/22/2019   Procedure: CRANIOPLASTY;  Surgeon: Lisbeth Renshaw, MD;  Location: [facility] OR;  Service: Neurosurgery;  Laterality: Left;  CRANIOPLASTY   CRANIOTOMY Left 11/27/2017   Procedure: CRANIOTOMY for Evacuation of Intracranial Hemorrhage;  Surgeon: Lisbeth Renshaw, MD;  Location: [facility] OR;  Service: Neurosurgery;  Laterality: Left;  CRANIOTOMY for Evacuation of Intracranial Hemorrhage   CRANIOTOMY Left 11/28/2017   Procedure: Decompressive craniectomy;  Surgeon: Maeola Harman, MD;  Location: [facility] OR;  Service: Neurosurgery;  Laterality: Left;   ESOPHAGOGASTRODUODENOSCOPY N/A 12/09/2017   Procedure: ESOPHAGOGASTRODUODENOSCOPY (EGD);  Surgeon: Violeta Gelinas, MD;  Location: [facility] ENDOSCOPY;  Service: General;  Laterality: N/A;   KNEE  ARTHROSCOPY WITH MENISCAL REPAIR Right 08/13/2019   Procedure: KNEE ARTHROSCOPY WITH PARTIAL MENISECTOMY, CHONDRAPLASTY MEDIAL LATERAL, PATELLA-FEMORAL;  Surgeon: Jodi Geralds, MD;  Location: [facility] ORS;  Service: Orthopedics;  Laterality: Right;   KNEE SURGERY Bilateral    PEG PLACEMENT N/A 12/09/2017   Procedure: PERCUTANEOUS ENDOSCOPIC GASTROSTOMY (PEG) PLACEMENT;  Surgeon: Violeta Gelinas, MD;  Location: [facility] ENDOSCOPY;  Service: General;  Laterality: N/A;    Social History:  Social History   Socioeconomic History   Marital status: Married    Spouse name: Not on file   Number of children: 1   Years of education: Not on file   Highest education level: Not on file  Occupational History   Not on file  Tobacco Use   Smoking status: Never   Smokeless tobacco: Never  Vaping Use   Vaping Use: Never used  Substance and Sexual Activity   Alcohol use: Not Currently   Drug use: Not Currently   Sexual activity: Not Currently  Other Topics Concern   Not on file  Social History Narrative   Not on file   Social Determinants of Health   Financial Resource Strain: Not on file  Food Insecurity: Not on file  Transportation Needs: Not on file  Physical Activity: Not on file  Stress: Not on file  Social Connections: Not on file  Intimate Partner Violence: Not on file    Family History:  Family History  Problem Relation Age of Onset   Hypertension Mother    Hypertension Father    Breast cancer Maternal Aunt     Medications:   Current Outpatient Medications on File Prior to Visit  Medication Sig Dispense Refill   acetaminophen (TYLENOL) 500 MG tablet Take 1,000 mg by mouth 3 (three) times daily.     acetaminophen-codeine (TYLENOL #3) 300-30 MG tablet Take 1 tablet by mouth 2 (two) times daily as needed for moderate pain.     atenolol-chlorthalidone (TENORETIC) 100-25 MG tablet Take 1 tablet by mouth daily.     atorvastatin (LIPITOR) 40 MG tablet Take 40 mg by mouth at bedtime.     b  complex vitamins tablet Take 1 tablet by mouth daily.     baclofen (LIORESAL) 10 MG tablet Take 10 mg by mouth in the morning and at bedtime.     diclofenac Sodium (VOLTAREN) 1 % GEL Apply topically as needed.     ergocalciferol (VITAMIN D2) 1.25 MG (50000 UT) capsule Take 50,000 Units by mouth once a week.     gabapentin (NEURONTIN) 300 MG capsule Take 1 capsule (300 mg total) by mouth 3 (three) times daily. 90 capsule 5   hydrOXYzine (VISTARIL) 50 MG capsule Take 50 mg by mouth daily as needed.     lactulose (CHRONULAC) 10 GM/15ML solution Take by mouth.     levETIRAcetam (KEPPRA) 1000 MG tablet Take 1,000 mg by mouth every 12 (twelve)  hours.     lidocaine (LMX) 4 % cream Apply 1 application  topically See admin instructions. Apply to old trach site once daily, may use an additional 2 times daily as needed for pain at old trach site     lisinopril (ZESTRIL) 2.5 MG tablet Take 2.5 mg by mouth daily.     metFORMIN (GLUCOPHAGE-XR) 500 MG 24 hr tablet Take 500 mg by mouth daily.     naproxen sodium (ALEVE) 220 MG tablet Take 220 mg by mouth 2 (two) times daily as needed (pain).     polyethylene glycol (MIRALAX / GLYCOLAX) 17 g packet Take 17 g by mouth daily as needed for moderate constipation. Mix in 4-8 oz liquid and drink     potassium chloride (KLOR-CON) 10 MEQ tablet Take 10 mEq by mouth 2 (two) times daily.     potassium chloride SA (KLOR-CON) 20 MEQ tablet Take 20 mEq by mouth every Monday, Wednesday, and Friday.     traZODone (DESYREL) 100 MG tablet Take 100 mg by mouth at bedtime.     Venlafaxine HCl 150 MG TB24 Take 1 tablet by mouth daily.     No current facility-administered medications on file prior to visit.    Allergies:  No Known Allergies   Physical Exam  Vitals:   10/31/21 1351  BP: (!) 164/96  Pulse: 76  Weight: 217 lb (98.4 kg)  Height: 5\' 3"  (1.6 m)   Body mass index is 38.44 kg/m. No results found.   General: well developed, well nourished, pleasant  African-American female, seated, in no evident distress Neck: supple with no carotid or supraclavicular bruits Cardiovascular: regular rate and rhythm, no murmurs Vascular:  Normal pulses all extremities  Neurologic Exam Mental Status: Awake and fully alert. Severe expressive aphasia with mild receptive aphasia.  No evidence of cognitive difficulties.  Able to say yes and no appropriately.  Mood and affect appropriate and cooperative with exam Cranial Nerves: Pupils equal, briskly reactive to light. Extraocular movements full without nystagmus. Visual fields blink to threat bilaterally.  Hearing intact. Facial sensation intact.  Right lower facial weakness. Motor: Full strength left upper and lower extremity RUE: 1/5 with spasticity greater in deltoid and fingers RLE: 3/5 ankle dorsiflexion weakness. 5/5 hip flexor, knee extension and flexion Sensory.: intact to touch , pinprick , position and vibratory sensation.  Coordination: Rapid alternating movements normal except RUE. Finger-to-nose and heel-to-shin performed accurately on left side Gait and Station: Deferred Reflexes: 2+ right side and 1+ right side. Toes downgoing.       ASSESSMENT: Tammie Miles is a 46 y.o. 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  Seizure activity 11/2017 placed on Keppra.  Cranioplasty on 01/22/2019 without complication.  Initial insult felt to be ischemic with large hemorrhagic transformation and continued bleeding.  Hospitalization complicated by acute respiratory insufficiency status post trach with decannulation, seizure, hypertensive emergency, anemia, and dysphagia s/p PEG. Vascular risk factors include HTN and depression.      PLAN:  Left MCA infarct with hemorrhagic transformation:   Residual deficits: Severe expressive aphasia, mild receptive aphasia and right spastic hemiparesis - stable  Recommend SNF consider increasing baclofen dosage from 10 mg twice daily to 10 mg 3 times daily for continued arm pain.  Continue gabapentin 300 mg 3 times daily Continue aspirin  81 mg daily and atorvastatin 40 mg daily for secondary stroke prevention Discussed secondary stroke prevention measures and importance of close PCP follow-up for aggressive stroke risk factor management including BP goal<130/90, HLD with LDL goal<70 and DM with A1c.<7   Chronic headaches:  Continued frontal headaches over the past year HST scheduled today prior to leaving office which is scheduled on 9/19 If apnea found, could be contributing to headaches and may improve after treatment If negative for apnea, may need to consider headache prophylactic therapy  Seizure, onset of stroke:  No recurrent events Continuation of Keppra 1000 mg twice daily for seizure prophylaxis Last seizure activity 05/2018    Follow up in 6 months or call earlier if needed   CC:  Karna Dupes, MD    I spent 31 minutes of face-to-face and non-face-to-face time with patient and caregiver.  This included previsit chart review, lab review, study review, electronic health record documentation, patient education regarding prior stroke, importance of managing stroke risk factors, residual deficits, post stroke seizure, chronic headaches and RUE pain and answered all other questions to patients satisfaction.   Ihor Austin, AGNP-BC  [facility] [address] [place], [place] [address]  Phone [phone] Fax [phone] Note: This document was prepared with digital dictation and possible smart phrase technology. Any transcriptional errors that result from this process are unintentional.

## 2021-10-31 NOTE — Patient Instructions (Addendum)
You have been scheduled for a home sleep test on 9/19 - please have someone pick machine up at our office at 2pm and will need to be returned the following day  Consider increasing baclofen dosage from 10mg  twice daily to 10mg  three times daily for continued arm pain OR could consider further increasing gabapentin dosage   Continue keppra 1000mg  twice daily for seizure prevention   Continue aspirin 81 mg daily  and atorvastatin for secondary stroke prevention  Continue to follow up with PCP regarding cholesterol, diabetes and blood pressure management  Maintain strict control of hypertension with blood pressure goal below 130/90, diabetes with hemoglobin A1c goal below 7.0 % and cholesterol with LDL cholesterol (bad cholesterol) goal below 70 mg/dL.   Signs of a Stroke? Follow the BEFAST method:  Balance Watch for a sudden loss of balance, trouble with coordination or vertigo Eyes Is there a sudden loss of vision in one or both eyes? Or double vision?  Face: Ask the person to smile. Does one side of the face droop or is it numb?  Arms: Ask the person to raise both arms. Does one arm drift downward? Is there weakness or numbness of a leg? Speech: Ask the person to repeat a simple phrase. Does the speech sound slurred/strange? Is the person confused ? Time: If you observe any of these signs, call 911.      Followup in the future with me in 6 months or call earlier if needed       Thank you for coming to see at [facility]. I hope we have been able to provide you high quality care today.  You may receive a patient satisfaction survey over the next few weeks. We would appreciate your feedback and comments so that we may continue to improve ourselves and the health of our patients.

## 2021-10-31 NOTE — Telephone Encounter (Signed)
HST- Medicare/medicaid no auth req.  Patient is scheduled at [facility] 9[facility]19[facility]2 at 2 pm.   Valli Glance gave the assistance living the paper of information to pick up the device.

## 2021-11-05 ENCOUNTER — Emergency Department (HOSPITAL_COMMUNITY): Payer: Medicare Other

## 2021-11-05 ENCOUNTER — Other Ambulatory Visit: Payer: Self-pay

## 2021-11-05 ENCOUNTER — Emergency Department (HOSPITAL_COMMUNITY)
Admission: EM | Admit: 2021-11-05 | Discharge: 2021-11-06 | Disposition: A | Discharge status: Skilled Nursing Facility | Payer: Medicare Other | Attending: Emergency Medicine | Admitting: Emergency Medicine

## 2021-11-05 ENCOUNTER — Encounter (HOSPITAL_COMMUNITY): Payer: Self-pay

## 2021-11-05 DIAGNOSIS — M79604 Pain in right leg: Secondary | ICD-10-CM | POA: Diagnosis not present

## 2021-11-05 DIAGNOSIS — S8261XA Displaced fracture of lateral malleolus of right fibula, initial encounter for closed fracture: Secondary | ICD-10-CM

## 2021-11-05 MED ORDER — HYDROCODONE-ACETAMINOPHEN 5-325 MG PO TABS
1.0000 | ORAL_TABLET | Freq: Once | ORAL | Status: AC
  Administered 2021-11-05: 1 via ORAL
  Filled 2021-11-05: qty 1

## 2021-11-05 MED ORDER — HYDROCODONE-ACETAMINOPHEN 5-325 MG PO TABS: 1.0000 | ORAL_TABLET | Freq: Four times a day (QID) | ORAL | 0 refills | Status: DC | PRN

## 2021-11-05 MED ORDER — LIDOCAINE 5 % EX PTCH
1.0000 | MEDICATED_PATCH | CUTANEOUS | Status: DC
  Administered 2021-11-05: 1 via TRANSDERMAL
  Filled 2021-11-05 (×2): qty 1

## 2021-11-05 NOTE — Discharge Instructions (Addendum)
Your x-ray showed a broken lateral malleolus.  Use prescribed medication for pain as directed.  If you can get by with elevating your ankle and taking Tylenol, that would be best.  Prescribed pain medication can cause you to be very sleepy or potentially fall.  Please follow-up with Dr. Warren Danes office for further management of your injury.

## 2021-11-05 NOTE — ED Triage Notes (Signed)
Patient brought in by [facility] after injuring her right ankle. Patient was transferring to her wheelchair and rolled her ankle. Patient was seen previously for the injury with Xrays taken. Unknown what facility. Patient complains of pain in the right ankle.

## 2021-11-05 NOTE — ED Notes (Signed)
Ortho tech paged for application of splint. 

## 2021-11-05 NOTE — ED Notes (Signed)
Pt transport to XR 

## 2021-11-05 NOTE — Progress Notes (Signed)
Orthopedic Tech Progress Note Patient Details:  Tammie Miles September 20, 1975 [id number]  Ortho Devices Type of Ortho Device: Short leg splint Ortho Device/Splint Location: RLE Ortho Device/Splint Interventions: Ordered, Application, Adjustment   Post Interventions Patient Tolerated: Well Instructions Provided: Care of device Splint applied, unable to get foot to a 90 degree angle due to stiffness and limited ROM. Darleen Crocker 11/05/2021, 9:26 PM

## 2021-11-05 NOTE — ED Notes (Signed)
Called [facility] for transport back to SNF, they advised she is 6th on the list.

## 2021-11-05 NOTE — ED Provider Notes (Signed)
[facility]-EMERGENCY DEPT Provider Note   CSN: [id number] Arrival date & time: 11/05/21  1922     History  Chief Complaint  Patient presents with   Ankle Injury    Tammie Miles is a 46 y.o. female.  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  History limited due to patient's expressive aphasia.  Per triage note, patient rolled her ankle while transferring.  She was seen somewhere for the injury and had x-rays which were negative.  These are not available in epic and I am uncertain how long ago this occurred.  She currently points to her right ankle and lower leg when asked where the pain is located.  Also points to an area in her left lower back and flank which is tender.       Home Medications Prior to Admission medications   Medication Sig Start Date End Date Taking? Authorizing Provider  acetaminophen (TYLENOL) 500 MG tablet Take 1,000 mg by mouth 3 (three) times daily.    [provider]  acetaminophen-codeine (TYLENOL #3) 300-30 MG tablet Take 1 tablet by mouth 2 (two) times daily as needed for moderate pain.    [provider]  atenolol-chlorthalidone (TENORETIC) 100-25 MG tablet Take 1 tablet by mouth daily. 04/03/21   [provider]  atorvastatin (LIPITOR) 40 MG tablet Take 40 mg by mouth at bedtime.    [provider]  b complex vitamins tablet Take 1 tablet by mouth daily.    [provider]  baclofen (LIORESAL) 10 MG tablet Take 10 mg by mouth in the morning and at bedtime.    [provider]  diclofenac Sodium (VOLTAREN) 1 % GEL Apply topically as needed.    [provider]  ergocalciferol (VITAMIN D2) 1.25 MG (50000 UT) capsule Take 50,000 Units by mouth once a week.    [provider]  gabapentin (NEURONTIN) 300 MG  capsule Take 1 capsule (300 mg total) by mouth 3 (three) times daily. 03/01/21   Ihor Austin, NP  hydrOXYzine (VISTARIL) 50 MG capsule Take 50 mg by mouth daily as needed. 08/31/21   [provider]  lactulose (CHRONULAC) 10 GM/15ML solution Take by mouth. 09/25/21   [provider]  levETIRAcetam (KEPPRA) 1000 MG tablet Take 1,000 mg by mouth every 12 (twelve) hours.    [provider]  lidocaine (LMX) 4 % cream Apply 1 application  topically See admin instructions. Apply to old trach site once daily, may use an additional 2 times daily as needed for pain at old trach site    [provider]  lisinopril (ZESTRIL) 2.5 MG tablet Take 2.5 mg by mouth daily. 04/03/21   [provider]  metFORMIN (GLUCOPHAGE-XR) 500 MG 24 hr tablet Take 500 mg by mouth daily. 09/01/20   [provider]  naproxen sodium (ALEVE) 220 MG tablet Take 220 mg by mouth 2 (two) times daily as needed (pain).    [provider]  polyethylene glycol (MIRALAX / GLYCOLAX) 17 g packet Take 17 g by mouth daily as needed for moderate constipation. Mix in 4-8 oz liquid and drink    [provider]  potassium chloride (KLOR-CON) 10 MEQ tablet Take 10 mEq by mouth 2 (two) times daily. 06/30/21   [provider]  potassium chloride SA (KLOR-CON) 20 MEQ tablet Take 20 mEq by mouth every Monday, Wednesday, and  Friday.    [provider]  traZODone (DESYREL) 100 MG tablet Take 100 mg by mouth at bedtime.    [provider]  Venlafaxine HCl 150 MG TB24 Take 1 tablet by mouth daily. 01/31/21   [provider]      Allergies    Patient has no known allergies.    Review of Systems   Review of Systems  Physical Exam Updated Vital Signs BP (!) 144/101 (BP Location: Left Arm)   Pulse 81   Temp 99.2 F (37.3 C) (Oral)   Resp 18   Ht 5\' 4"  (1.626 m)   Wt 90.7 kg   SpO2 95%   BMI 34.33 kg/m  Physical Exam Vitals and nursing note  reviewed.  Constitutional:      Appearance: She is well-developed.  HENT:     Head: Normocephalic and atraumatic.  Eyes:     Pupils: Pupils are equal, round, and reactive to light.  Cardiovascular:     Pulses: Normal pulses. No decreased pulses.  Musculoskeletal:        General: Tenderness present.     Cervical back: Normal range of motion and neck supple.     Lumbar back: Tenderness present. No bony tenderness.     Right knee: No tenderness.     Right lower leg: Tenderness (Distal) present. No swelling.     Right ankle: Tenderness present over the lateral malleolus and medial malleolus.     Right foot: Tenderness present. No bony tenderness.     Comments: Patient winces in pain when I palpate over the far lateral left sided lower back and flank.  No bruising or signs of trauma noted.  Skin:    General: Skin is warm and dry.  Neurological:     Mental Status: She is alert.     Sensory: No sensory deficit.     Comments: Motor, sensation, and vascular distal to the injury is fully intact.   Psychiatric:        Mood and Affect: Mood normal.     ED Results / Procedures / Treatments   Labs (all labs ordered are listed, but only abnormal results are displayed) Labs Reviewed - No data to display  EKG None  Radiology No results found.  Procedures Procedures    Medications Ordered in ED Medications  HYDROcodone-acetaminophen (NORCO/VICODIN) 5-325 MG per tablet 1 tablet (has no administration in time range)  lidocaine (LIDODERM) 5 % 1 patch (has no administration in time range)    ED Course/ Medical Decision Making/ A&P    Patient seen and examined. History obtained directly from patient.   Labs/EKG: None ordered  Imaging: X-ray of the right foot and tib-fib  Medications/Fluids: Ordered: Vicodin p.o. x1, Lidoderm patch.   Most recent vital signs reviewed and are as follows: BP (!) 144/101 (BP Location: Left Arm)   Pulse 81   Temp 99.2 F (37.3 C) (Oral)   Resp  18   Ht 5\' 4"  (1.626 m)   Wt 90.7 kg   SpO2 95%   BMI 34.33 kg/m   Initial impression: Right lower extremity pain, recent reported injury.  9:08 PM Reassessment performed. Patient appears stable.  Imaging personally visualized and interpreted including: X-ray of the right tib-fib, right foot.  Agree lateral malleolus fracture acute, old tib-fib injury.  Reviewed pertinent lab work and imaging with patient at bedside. Questions answered.   Most current vital signs reviewed and are as follows: BP (!) 134/115 (BP Location: Left Arm) Comment:  After multiple attempt  Pulse 65   Temp 99.2 F (37.3 C) (Oral)   Resp 18   Ht 5\' 4"  (1.626 m)   Wt 90.7 kg   SpO2 94%   BMI 34.33 kg/m   Plan: Discharge to home.  When I ask if she ever bears weight on the right leg, she states no.  I do not think she would be a good candidate for a cam walker due to spasticity of the right lower extremity.  Will ask for posterior splint to be placed for comfort and to better fit the contour of her lower leg and ankle.   Prescriptions written for: Vicodin PO for severe pain.   9:11 PM Patient counseled on use of narcotic pain medications. Counseled not to combine these medications with others containing tylenol. Urged not to drink alcohol or perform any other activities that requires focus while taking these medications. The patient verbalizes understanding and agrees with the plan.  Other home care instructions discussed: RICE protocol  ED return instructions discussed: Worsening or other concerns  Follow-up instructions discussed: Patient encouraged to follow-up with orthopedist in 1 week.                               Medical Decision Making Amount and/or Complexity of Data Reviewed Radiology: ordered.  Risk Prescription drug management.   Right lower extremity swelling and pain, x-ray demonstrates lateral malleolus fracture, likely because of the patient's symptoms.  Placed in splint and  provided with follow-up referral.  Lower extremity is neurovascularly intact.  No concern for knee or hip injury.  Patient with previous stroke with chronic right-sided spastic hemiparesis.        Final Clinical Impression(s) / ED Diagnoses Final diagnoses:  None    Rx / DC Orders ED Discharge Orders     None         11/05/21 2149    2150, MD 11/05/21 [phone]

## 2021-11-06 MED ORDER — ACETAMINOPHEN 325 MG PO TABS
650.0000 mg | ORAL_TABLET | Freq: Once | ORAL | Status: AC
  Administered 2021-11-06: 650 mg via ORAL
  Filled 2021-11-06: qty 2

## 2021-11-06 NOTE — ED Notes (Signed)
Report given to [facility].

## 2021-11-06 NOTE — ED Notes (Signed)
[facility] is here for transport.  

## 2021-11-15 ENCOUNTER — Encounter: Payer: Self-pay | Admitting: Orthopaedic Surgery

## 2021-11-15 ENCOUNTER — Ambulatory Visit (INDEPENDENT_AMBULATORY_CARE_PROVIDER_SITE_OTHER): Payer: Medicare Other | Admitting: Orthopaedic Surgery

## 2021-11-15 DIAGNOSIS — I639 Cerebral infarction, unspecified: Secondary | ICD-10-CM

## 2021-11-15 DIAGNOSIS — R569 Unspecified convulsions: Secondary | ICD-10-CM | POA: Diagnosis not present

## 2021-11-15 DIAGNOSIS — S82891A Other fracture of right lower leg, initial encounter for closed fracture: Secondary | ICD-10-CM

## 2021-11-15 NOTE — Progress Notes (Signed)
Office Visit Note   Patient: Tammie Miles           Date of Birth: 02-Dec-1975           MRN: [id number] Visit Date: 11/15/2021              Requested by: Karna Dupes, MD [address] [place],  [place] [address] PCP: Karna Dupes, MD   Assessment & Plan: Visit Diagnoses:  1. Closed avulsion fracture of right ankle, initial encounter     Plan: Impression is 1 week status post right ankle avulsion to the distal fibula.  This should be amenable to nonoperative treatment.  We will place her in a cam boot weightbearing as tolerated.  She will ice and elevate for pain and swelling.  She will follow-up with [place] in 3 weeks for repeat evaluation and three-view x-rays of right ankle.  Call with concerns or questions in the meantime.  Follow-Up Instructions: No follow-ups on file.   Orders:  No orders of the defined types were placed in this encounter.  No orders of the defined types were placed in this encounter.     Procedures: No procedures performed   Clinical Data: No additional findings.   Subjective: Chief Complaint  Patient presents with   Right Ankle - Pain     DOI 11/05/2021    HPI patient is a pleasant 46 year old female who comes in today with an injury to her right ankle.  On 11/05/2021, she was dancing when she possibly twisted her ankle.  She was seen at [facility] long where x-rays were obtained.  X-rays demonstrated a avulsion fracture to the right distal fibula.  She was placed in a splint.  She is here today for follow-up.  Of note, she lives at [facility] and is status post CVA back in 2019 for which she has right sided weakness.  She is primarily wheelchair-bound but does stand for transfers.  Review of Systems as detailed in HPI.  All others reviewed and are negative.   Objective: Vital Signs: There were no vitals taken for this visit.  Physical Exam well-developed well-nourished female in no acute distress.    Ortho Exam right ankle  exam reveals moderate swelling.  She will not allow me to touch her ankle.  She is able to wiggle her toes.  Specialty Comments:  No specialty comments available.  Imaging: No results found.   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/17/2021   Disability due to neurological disorder 05/17/2021   Aphasia as late effect of cerebrovascular accident (CVA) 05/17/2021   Reactive depression 05/17/2021   Excessive daytime sleepiness 05/17/2021   Skull defect 01/22/2019   Cerebral edema (HCC) 01/16/2018   At high risk for injury related to fall 01/16/2018   Seizures (HCC) 01/16/2018   Attention deficit hyperactivity disorder (ADHD) 01/16/2018   Generalized anxiety disorder 01/16/2018   Depression 01/16/2018   Tracheostomy in place [facility])    Pressure injury of skin 12/29/2017   Altered mental status    Central venous catheter in place    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/02/2017   Stroke (cerebrum) (HCC) 12/02/2017   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   Hypertensive emergency 12/02/2017   Anemia 12/02/2017   Leukocytosis 12/02/2017   Acute intra-cranial hemorrhage (HCC)  Respiratory failure (HCC)    ICH (intracerebral hemorrhage) (HCC) 11/27/2017   Intracranial hemorrhage (HCC) 11/27/2017   Past Medical History:  Diagnosis Date   Acute posthemorrhagic anemia    hx of    ADHD    Anxiety    Aphasia    Aphasia following unspecified cerebrovascular disease    Arthritis    Depression    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HCC)    Hemiplegia affecting right nondominant side (HCC)    Hypertension    Internal derangement of left knee    Seizures (HCC)    Stroke (HCC)    cerebral infarction due to occlusion or stenosis of left middle cerebral artery     Family History  Problem Relation Age of Onset    Hypertension Mother    Hypertension Father    Breast cancer Maternal Aunt     Past Surgical History:  Procedure Laterality Date   CRANIOPLASTY Left 01/22/2019   Procedure: CRANIOPLASTY;  Surgeon: Lisbeth Renshaw, MD;  Location: [facility] OR;  Service: Neurosurgery;  Laterality: Left;  CRANIOPLASTY   CRANIOTOMY Left 11/27/2017   Procedure: CRANIOTOMY for Evacuation of Intracranial Hemorrhage;  Surgeon: Lisbeth Renshaw, MD;  Location: [facility] OR;  Service: Neurosurgery;  Laterality: Left;  CRANIOTOMY for Evacuation of Intracranial Hemorrhage   CRANIOTOMY Left 11/28/2017   Procedure: Decompressive craniectomy;  Surgeon: Maeola Harman, MD;  Location: [facility] OR;  Service: Neurosurgery;  Laterality: Left;   ESOPHAGOGASTRODUODENOSCOPY N/A 12/09/2017   Procedure: ESOPHAGOGASTRODUODENOSCOPY (EGD);  Surgeon: Violeta Gelinas, MD;  Location: [facility] ENDOSCOPY;  Service: General;  Laterality: N/A;   KNEE ARTHROSCOPY WITH MENISCAL REPAIR Right 08/13/2019   Procedure: KNEE ARTHROSCOPY WITH PARTIAL MENISECTOMY, CHONDRAPLASTY MEDIAL LATERAL, PATELLA-FEMORAL;  Surgeon: Jodi Geralds, MD;  Location: [facility] ORS;  Service: Orthopedics;  Laterality: Right;   KNEE SURGERY Bilateral    PEG PLACEMENT N/A 12/09/2017   Procedure: PERCUTANEOUS ENDOSCOPIC GASTROSTOMY (PEG) PLACEMENT;  Surgeon: Violeta Gelinas, MD;  Location: [facility] ENDOSCOPY;  Service: General;  Laterality: N/A;   Social History   Occupational History   Not on file  Tobacco Use   Smoking status: Never   Smokeless tobacco: Never  Vaping Use   Vaping Use: Never used  Substance and Sexual Activity   Alcohol use: Not Currently   Drug use: Not Currently   Sexual activity: Not Currently

## 2021-11-22 ENCOUNTER — Telehealth: Payer: Self-pay | Admitting: Orthopaedic Surgery

## 2021-11-22 NOTE — Telephone Encounter (Signed)
April Crayton for [employer] [phone](L) and fax [phone] called about Tammie Miles and wants on order sent over on how long the boot she was given  is suppose to stay on the patient and also can you please send over office notes as well.

## 2021-11-23 NOTE — Telephone Encounter (Signed)
Until f/u which should be 3 weeks from last visit

## 2021-12-04 ENCOUNTER — Ambulatory Visit (INDEPENDENT_AMBULATORY_CARE_PROVIDER_SITE_OTHER): Payer: Medicare Other | Admitting: Orthopaedic Surgery

## 2021-12-04 ENCOUNTER — Ambulatory Visit (INDEPENDENT_AMBULATORY_CARE_PROVIDER_SITE_OTHER): Payer: Medicare Other

## 2021-12-04 DIAGNOSIS — S82891A Other fracture of right lower leg, initial encounter for closed fracture: Secondary | ICD-10-CM

## 2021-12-04 DIAGNOSIS — I639 Cerebral infarction, unspecified: Secondary | ICD-10-CM | POA: Diagnosis not present

## 2021-12-04 DIAGNOSIS — R569 Unspecified convulsions: Secondary | ICD-10-CM | POA: Diagnosis not present

## 2021-12-04 NOTE — Progress Notes (Signed)
Office Visit Note   Patient: Tammie Miles           Date of Birth: [birth year]           MRN: [id number] Visit Date: 12/04/2021              Requested by: Raymondo Band, MD [address] [place],   [address] PCP: Raymondo Band, MD   Assessment & Plan: Visit Diagnoses:  1. Closed avulsion fracture of right ankle, initial encounter     Plan: Impression is 4 weeks status post right ankle avulsion fracture and new problem of pressure ulcer to the posterior ankle.  At this point, we will discontinue her cam walker.  I do not think she needs an ASO as she only bears weight for transfers.  The pressure ulcer is contained to the dermal layer only, so we will treat this with mupirocin twice daily and off loading of the area.  Have written this on a prescription to give to [place] for the nursing staff.  She will follow-up with [place] in 4 weeks time for repeat evaluation and three-view x-rays of the right ankle.  Call with concerns or questions.  Follow-Up Instructions: Return in about 4 weeks (around 01/01/2022).   Orders:  Orders Placed This Encounter  Procedures   XR Ankle Complete Right   No orders of the defined types were placed in this encounter.     Procedures: No procedures performed   Clinical Data: No additional findings.   Subjective: Chief Complaint  Patient presents with   Right Ankle - Follow-up    HPI patient is a pleasant 46 year old female with a history of right upper extremity hemiparalysis from the CVA who comes in today approximately 4 weeks status post right ankle avulsion fracture to the fibula.  She is still in pain to the right lower extremity but this appears to have improved.  She is a resident of [place] and is trying to communicate that she has not been getting medicine for this.  She has been compliant wearing her boot but I am unable to tell if she is wearing this at night while sleeping.     Objective: Vital  Signs: There were no vitals taken for this visit.    Ortho Exam right ankle exam reveals mild to moderate swelling.  Moderate tenderness to the lateral malleolus.  Calf is soft and nontender.  She does have a 2 cm pressure ulcer to the back of the heel.  This is located to the dermal layer only.  No evidence of infection or cellulitis.  She is neurovascular intact distally.  Specialty Comments:  No specialty comments available.  Imaging: XR Ankle Complete Right  Result Date: 12/04/2021 Xrays demonstrate stable alignment of the fracture    PMFS History: Patient Active Problem List   Diagnosis Date Noted   Hemiparesis affecting right side as late effect of cerebrovascular accident ([place]) 05/17/2021   Insomnia due to anxiety and fear 05/17/2021   Disability due to neurological disorder 05/17/2021   Aphasia as late effect of cerebrovascular accident (CVA) 05/17/2021   Reactive depression 05/17/2021   Excessive daytime sleepiness 05/17/2021   Skull defect 01/22/2019   Cerebral edema ([place]) 01/16/2018   At high risk for injury related to fall 01/16/2018   Seizures (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   Stroke (cerebrum) (HCC) 12/02/2017   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   Hypertensive emergency 12/02/2017   Anemia 12/02/2017   Leukocytosis 12/02/2017   Acute intra-cranial hemorrhage (HCC)    Respiratory failure (HCC)    ICH (intracerebral hemorrhage) (HCC) 11/27/2017   Intracranial hemorrhage (HCC) 11/27/2017   Past Medical History:  Diagnosis Date   Acute posthemorrhagic anemia    hx of    ADHD    Anxiety    Aphasia    Aphasia following unspecified cerebrovascular  disease    Arthritis    Depression    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HCC)    Hemiplegia affecting right nondominant side (HCC)    Hypertension    Internal derangement of left knee    Seizures (HCC)    Stroke (HCC)    cerebral infarction due to occlusion or stenosis of left middle cerebral artery     Family History  Problem Relation Age of Onset   Hypertension Mother    Hypertension Father    Breast cancer Maternal Aunt     Past Surgical History:  Procedure Laterality Date   CRANIOPLASTY Left 01/22/2019   Procedure: CRANIOPLASTY;  Surgeon: Lisbeth Renshaw, MD;  Location: [facility] OR;  Service: Neurosurgery;  Laterality: Left;  CRANIOPLASTY   CRANIOTOMY Left 11/27/2017   Procedure: CRANIOTOMY for Evacuation of Intracranial Hemorrhage;  Surgeon: Lisbeth Renshaw, MD;  Location: [facility] OR;  Service: Neurosurgery;  Laterality: Left;  CRANIOTOMY for Evacuation of Intracranial Hemorrhage   CRANIOTOMY Left 11/28/2017   Procedure: Decompressive craniectomy;  Surgeon: Maeola Harman, MD;  Location: [facility] OR;  Service: Neurosurgery;  Laterality: Left;   ESOPHAGOGASTRODUODENOSCOPY N/A 12/09/2017   Procedure: ESOPHAGOGASTRODUODENOSCOPY (EGD);  Surgeon: Violeta Gelinas, MD;  Location: [facility] ENDOSCOPY;  Service: General;  Laterality: N/A;   KNEE ARTHROSCOPY WITH MENISCAL REPAIR Right 08/13/2019   Procedure: KNEE ARTHROSCOPY WITH PARTIAL MENISECTOMY, CHONDRAPLASTY MEDIAL LATERAL, PATELLA-FEMORAL;  Surgeon: Jodi Geralds, MD;  Location: [facility] ORS;  Service: Orthopedics;  Laterality: Right;   KNEE SURGERY Bilateral    PEG PLACEMENT N/A 12/09/2017   Procedure: PERCUTANEOUS ENDOSCOPIC GASTROSTOMY (PEG) PLACEMENT;  Surgeon: Violeta Gelinas, MD;  Location: [facility] ENDOSCOPY;  Service: General;  Laterality: N/A;   Social History   Occupational History   Not on file  Tobacco Use   Smoking status: Never   Smokeless tobacco: Never  Vaping Use   Vaping Use: Never used  Substance and Sexual Activity   Alcohol use: Not Currently   Drug  use: Not Currently   Sexual activity: Not Currently

## 2021-12-05 ENCOUNTER — Telehealth: Payer: Self-pay | Admitting: Orthopaedic Surgery

## 2021-12-05 NOTE — Telephone Encounter (Signed)
Received vm from April @ [facility]. Needs 9/26 ov note faxed. I faxed through Epic [phone]

## 2022-01-27 ENCOUNTER — Emergency Department (HOSPITAL_COMMUNITY): Payer: Medicare Other

## 2022-01-27 ENCOUNTER — Emergency Department (HOSPITAL_COMMUNITY)
Admission: EM | Admit: 2022-01-27 | Discharge: 2022-01-27 | Disposition: A | Discharge status: Home / Self Care | Payer: Medicare Other | Attending: Emergency Medicine | Admitting: Emergency Medicine

## 2022-01-27 ENCOUNTER — Other Ambulatory Visit: Payer: Self-pay

## 2022-01-27 DIAGNOSIS — S8261XA Displaced fracture of lateral malleolus of right fibula, initial encounter for closed fracture: Secondary | ICD-10-CM | POA: Insufficient documentation

## 2022-01-27 DIAGNOSIS — Z8673 Personal history of transient ischemic attack (TIA), and cerebral infarction without residual deficits: Secondary | ICD-10-CM | POA: Diagnosis not present

## 2022-01-27 DIAGNOSIS — S8291XA Unspecified fracture of right lower leg, initial encounter for closed fracture: Secondary | ICD-10-CM | POA: Diagnosis not present

## 2022-01-27 DIAGNOSIS — Z7984 Long term (current) use of oral hypoglycemic drugs: Secondary | ICD-10-CM | POA: Diagnosis not present

## 2022-01-27 DIAGNOSIS — W050XXA Fall from non-moving wheelchair, initial encounter: Secondary | ICD-10-CM | POA: Diagnosis not present

## 2022-01-27 DIAGNOSIS — W19XXXA Unspecified fall, initial encounter: Secondary | ICD-10-CM

## 2022-01-27 DIAGNOSIS — G9389 Other specified disorders of brain: Secondary | ICD-10-CM | POA: Diagnosis not present

## 2022-01-27 DIAGNOSIS — S82891A Other fracture of right lower leg, initial encounter for closed fracture: Secondary | ICD-10-CM

## 2022-01-27 DIAGNOSIS — M79661 Pain in right lower leg: Secondary | ICD-10-CM | POA: Diagnosis present

## 2022-01-27 MED ORDER — MORPHINE SULFATE (PF) 4 MG/ML IV SOLN
4.0000 mg | Freq: Once | INTRAVENOUS | Status: AC
  Administered 2022-01-27: 4 mg via INTRAMUSCULAR
  Filled 2022-01-27: qty 1

## 2022-01-27 MED ORDER — KETOROLAC TROMETHAMINE 30 MG/ML IJ SOLN
15.0000 mg | Freq: Once | INTRAMUSCULAR | Status: AC
  Administered 2022-01-27: 15 mg via INTRAMUSCULAR
  Filled 2022-01-27: qty 1

## 2022-01-27 NOTE — Discharge Instructions (Signed)
Please be sure to use Tylenol, ibuprofen and your gabapentin for pain control.  Follow-up with the orthopedic physician in about 1 week for further monitoring, management.  Return here for concerning changes in your condition.

## 2022-01-27 NOTE — ED Notes (Signed)
[facility] called for pt. Report x2 with no response from facility. Report given to [facility].

## 2022-01-27 NOTE — ED Provider Notes (Signed)
[facility] DEPT Provider Note   CSN: [id number] Arrival date & time: 01/27/22  1748     History  Chief Complaint  Patient presents with   Bayard Hugger is a 46 y.o. female.  HPI Patient with history of stroke, baseline verbal deficit, communicates largely with nodding, brief speech presents after a fall.  She was in her usual state of health prior to the event.  She notes that while transferring from wheelchair to bed she fell.  Since that time she had pain in the right lower extremity from the knee inferiorly, as well as in the right side of her face, and left posterior thorax.  No reported loss of consciousness.  She has baseline hemiplegia, right-sided, that is unchanged.  No medication taken for pain relief.    Home Medications Prior to Admission medications   Medication Sig Start Date End Date Taking? Authorizing Provider  acetaminophen (TYLENOL) 500 MG tablet Take 1,000 mg by mouth 3 (three) times daily.    [provider]  acetaminophen-codeine (TYLENOL #3) 300-30 MG tablet Take 1 tablet by mouth 2 (two) times daily as needed for moderate pain.    [provider]  atenolol-chlorthalidone (TENORETIC) 100-25 MG tablet Take 1 tablet by mouth daily. 04/03/21   [provider]  atorvastatin (LIPITOR) 40 MG tablet Take 40 mg by mouth at bedtime.    [provider]  b complex vitamins tablet Take 1 tablet by mouth daily.    [provider]  baclofen (LIORESAL) 10 MG tablet Take 10 mg by mouth in the morning and at bedtime.    [provider]  diclofenac Sodium (VOLTAREN) 1 % GEL Apply topically as needed.    [provider]  ergocalciferol (VITAMIN D2) 1.25 MG (50000 UT) capsule Take 50,000 Units by mouth once a week.    [provider]  gabapentin (NEURONTIN) 300 MG capsule Take 1 capsule (300 mg total) by mouth 3 (three) times daily. 03/01/21   Frann Rider, NP   HYDROcodone-acetaminophen (NORCO/VICODIN) 5-325 MG tablet Take 1 tablet by mouth every 6 (six) hours as needed for severe pain. 11/05/21   Carlisle Cater, PA-C  hydrOXYzine (VISTARIL) 50 MG capsule Take 50 mg by mouth daily as needed. 08/31/21   [provider]  lactulose (CHRONULAC) 10 GM/15ML solution Take by mouth. 09/25/21   [provider]  levETIRAcetam (KEPPRA) 1000 MG tablet Take 1,000 mg by mouth every 12 (twelve) hours.    [provider]  lidocaine (LMX) 4 % cream Apply 1 application  topically See admin instructions. Apply to old trach site once daily, may use an additional 2 times daily as needed for pain at old trach site    [provider]  lisinopril (ZESTRIL) 2.5 MG tablet Take 2.5 mg by mouth daily. 04/03/21   [provider]  metFORMIN (GLUCOPHAGE-XR) 500 MG 24 hr tablet Take 500 mg by mouth daily. 09/01/20   [provider]  naproxen sodium (ALEVE) 220 MG tablet Take 220 mg by mouth 2 (two) times daily as needed (pain).    [provider]  polyethylene glycol (MIRALAX / GLYCOLAX) 17 g packet Take 17 g by mouth daily as needed for moderate constipation. Mix in 4-8 oz liquid and drink    [provider]  potassium chloride (KLOR-CON) 10 MEQ tablet Take 10 mEq by mouth 2 (two) times daily. 06/30/21   [provider]  potassium chloride SA (KLOR-CON) 20 MEQ  tablet Take 20 mEq by mouth every Monday, Wednesday, and Friday.    [provider]  traZODone (DESYREL) 100 MG tablet Take 100 mg by mouth at bedtime.    [provider]  Venlafaxine HCl 150 MG TB24 Take 1 tablet by mouth daily. 01/31/21   [provider]      Allergies    Patient has no known allergies.    Review of Systems   Review of Systems  All other systems reviewed and are negative.   Physical Exam Updated Vital Signs BP (!) 141/77   Pulse 83   Temp 98.8 F (37.1 C)   Resp (!) 21   Ht 5\' 4"  (1.626 m)   Wt  108.9 kg   LMP 01/25/2022   SpO2 96%   BMI 41.20 kg/m  Physical Exam Vitals and nursing note reviewed.  Constitutional:      General: She is not in acute distress.    Appearance: She is well-developed. She is obese. She is not toxic-appearing.  HENT:     Head: Normocephalic.   Eyes:     Conjunctiva/sclera: Conjunctivae normal.  Neck:   Cardiovascular:     Rate and Rhythm: Normal rate and regular rhythm.  Pulmonary:     Effort: Pulmonary effort is normal. No respiratory distress.     Breath sounds: Normal breath sounds. No stridor.  Abdominal:     General: There is no distension.  Musculoskeletal:       Legs:  Skin:    General: Skin is warm and dry.       Neurological:     Mental Status: She is alert and oriented to person, place, and time.     Cranial Nerves: Cranial nerve deficit and dysarthria present.     Sensory: Sensory deficit present.     Motor: Weakness and atrophy present. No tremor.     Comments: Speech is minimal, brief, patient does follow activity, and nods to communicate.  Right-sided hemiplegia, unchanged.  Psychiatric:        Mood and Affect: Mood normal.     ED Results / Procedures / Treatments   Labs (all labs ordered are listed, but only abnormal results are displayed) Labs Reviewed - No data to display  EKG EKG Interpretation  Date/Time:  Sunday January 27 2022 18:00:45 EST Ventricular Rate:  88 PR Interval:  173 QRS Duration: 99 QT Interval:  408 QTC Calculation: 494 R Axis:   0 Text Interpretation: Sinus rhythm Anteroseptal infarct, age indeterminate Artifact Abnormal ECG Confirmed by 03-28-1969 [phone]) on 01/27/2022 6:14:10 PM  Radiology DG Shoulder Left  Result Date: 01/27/2022 CLINICAL DATA:  Fall EXAM: LEFT SHOULDER - 2+ VIEW COMPARISON:  None Available. FINDINGS: Suboptimal scapular Y-view secondary to patient positioning. Can not exclude posterior subluxation. No fractures are seen. Mild degenerative changes of the  glenoid. Soft tissues are unremarkable. IMPRESSION: Suboptimal scapular Y-view secondary to patient positioning. Can not exclude posterior subluxation. Consider follow-up axillary view if there is high clinical suspicion. No fracture identified. Electronically Signed   By: 01/29/2022 M.D.   On: 01/27/2022 19:25   DG ELBOW COMPLETE RIGHT (3+VIEW)  Result Date: 01/27/2022 CLINICAL DATA:  Fall, elbow pain EXAM: RIGHT ELBOW - COMPLETE 3+ VIEW COMPARISON:  None Available. FINDINGS: There is no evidence of fracture, dislocation, or joint effusion. There is no evidence of arthropathy or other focal bone abnormality. Soft tissues are unremarkable. IMPRESSION: Negative. Electronically Signed   By: 01/29/2022 M.D.  On: 01/27/2022 19:25   DG Ankle Complete Right  Result Date: 01/27/2022 CLINICAL DATA:  Fall, right ankle pain, deformity EXAM: RIGHT ANKLE - COMPLETE 3+ VIEW COMPARISON:  None Available. FINDINGS: There is a fracture through the lateral malleolus, mildly displaced. Overlying soft tissue swelling. No subluxation or dislocation. IMPRESSION: Lateral malleolar fracture. Electronically Signed   By: Rolm Baptise M.D.   On: 01/27/2022 19:23   DG Hip Unilat With Pelvis 2-3 Views Right  Result Date: 01/27/2022 CLINICAL DATA:  Fall EXAM: DG HIP (WITH OR WITHOUT PELVIS) 2-3V RIGHT COMPARISON:  None Available. FINDINGS: There is no evidence of hip fracture or dislocation. There is no evidence of arthropathy or other focal bone abnormality. IMPRESSION: Negative. Electronically Signed   By: Rolm Baptise M.D.   On: 01/27/2022 19:22   DG Knee Complete 4 Views Right  Result Date: 01/27/2022 CLINICAL DATA:  Fall EXAM: RIGHT KNEE - COMPLETE 4+ VIEW COMPARISON:  None Available. FINDINGS: Degenerative changes with joint space narrowing and spurring most pronounced in the medial compartment. No acute bony abnormality. Specifically, no fracture, subluxation, or dislocation. No joint effusion. IMPRESSION: No  acute bony abnormality. Electronically Signed   By: Rolm Baptise M.D.   On: 01/27/2022 19:21   DG Ribs Unilateral W/Chest Left  Result Date: 01/27/2022 CLINICAL DATA:  Fall EXAM: LEFT RIBS AND CHEST - 3+ VIEW COMPARISON:  04/25/2021 FINDINGS: No fracture or other bone lesions are seen involving the ribs. There is no evidence of pneumothorax or pleural effusion. Both lungs are clear. Heart size and mediastinal contours are within normal limits. No pneumothorax IMPRESSION: Negative. Electronically Signed   By: Rolm Baptise M.D.   On: 01/27/2022 19:20   CT Head Wo Contrast  Result Date: 01/27/2022 CLINICAL DATA:  Trauma. EXAM: CT HEAD WITHOUT CONTRAST CT CERVICAL SPINE WITHOUT CONTRAST TECHNIQUE: Multidetector CT imaging of the head and cervical spine was performed following the standard protocol without intravenous contrast. Multiplanar CT image reconstructions of the cervical spine were also generated. RADIATION DOSE REDUCTION: This exam was performed according to the departmental dose-optimization program which includes automated exposure control, adjustment of the mA and/or kV according to patient size and/or use of iterative reconstruction technique. COMPARISON:  CT Head 12/02/17 FINDINGS: CT HEAD FINDINGS Brain: Redemonstrated is a large area of encephalomalacia in the left MCA territory involving left frontal lobe, temporal lobe, and parietal lobe. There is ex vacuo dilatation of the left lateral ventricle. There are also postsurgical from a likely decompressive left hemicraniectomy. Carnioplasty flap appears intact without evidence of fracture. There is a small amount of hyperdense material immediately adjacent to the cranioplasty site, which is favored to represent postsurgical change/dural thickening. No evidence of new infarct. No evidence of intracranial hemorrhage. 2 no evidence of hydrocephalus. Vascular: No hyperdense vessel or unexpected calcification. Skull: See above for cranioplasty  description. Sinuses/Orbits: No acute finding. Other: None CT CERVICAL SPINE FINDINGS Alignment: There is straightening of the normal cervical lordosis. Skull base and vertebrae: No acute fracture. No primary bone lesion or focal pathologic process. Soft tissues and spinal canal: No prevertebral fluid or swelling. No visible canal hematoma. Disc levels:  1 no evidence of high-grade spinal canal stenosis. Upper chest: Negative. Other: None IMPRESSION: 1. No CT evidence of intracranial injury. 2. No acute cervical spine fracture or traumatic malalignment. 3. Postsurgical changes from a decompressive left hemicraniectomy and cranioplasty. Flap appears intact without evidence of fracture. There is a small amount of hyperdense material immediately adjacent to the cranioplasty site,  which is favored to represent postsurgical change/dural thickening. 4. Large area of encephalomalacia in the left MCA territory with ex vacuo dilatation of the left lateral ventricle. Electronically Signed   By: Marin Roberts M.D.   On: 01/27/2022 18:35   CT Cervical Spine Wo Contrast  Result Date: 01/27/2022 CLINICAL DATA:  Trauma. EXAM: CT HEAD WITHOUT CONTRAST CT CERVICAL SPINE WITHOUT CONTRAST TECHNIQUE: Multidetector CT imaging of the head and cervical spine was performed following the standard protocol without intravenous contrast. Multiplanar CT image reconstructions of the cervical spine were also generated. RADIATION DOSE REDUCTION: This exam was performed according to the departmental dose-optimization program which includes automated exposure control, adjustment of the mA and/or kV according to patient size and/or use of iterative reconstruction technique. COMPARISON:  CT Head 12/02/17 FINDINGS: CT HEAD FINDINGS Brain: Redemonstrated is a large area of encephalomalacia in the left MCA territory involving left frontal lobe, temporal lobe, and parietal lobe. There is ex vacuo dilatation of the left lateral ventricle. There are  also postsurgical from a likely decompressive left hemicraniectomy. Carnioplasty flap appears intact without evidence of fracture. There is a small amount of hyperdense material immediately adjacent to the cranioplasty site, which is favored to represent postsurgical change/dural thickening. No evidence of new infarct. No evidence of intracranial hemorrhage. 2 no evidence of hydrocephalus. Vascular: No hyperdense vessel or unexpected calcification. Skull: See above for cranioplasty description. Sinuses/Orbits: No acute finding. Other: None CT CERVICAL SPINE FINDINGS Alignment: There is straightening of the normal cervical lordosis. Skull base and vertebrae: No acute fracture. No primary bone lesion or focal pathologic process. Soft tissues and spinal canal: No prevertebral fluid or swelling. No visible canal hematoma. Disc levels:  1 no evidence of high-grade spinal canal stenosis. Upper chest: Negative. Other: None IMPRESSION: 1. No CT evidence of intracranial injury. 2. No acute cervical spine fracture or traumatic malalignment. 3. Postsurgical changes from a decompressive left hemicraniectomy and cranioplasty. Flap appears intact without evidence of fracture. There is a small amount of hyperdense material immediately adjacent to the cranioplasty site, which is favored to represent postsurgical change/dural thickening. 4. Large area of encephalomalacia in the left MCA territory with ex vacuo dilatation of the left lateral ventricle. Electronically Signed   By: Marin Roberts M.D.   On: 01/27/2022 18:35    Procedures Procedures    Medications Ordered in ED Medications  ketorolac (TORADOL) 30 MG/ML injection 15 mg (has no administration in time range)  morphine (PF) 4 MG/ML injection 4 mg (has no administration in time range)    ED Course/ Medical Decision Making/ A&P                           Medical Decision Making Patient with baseline hemiplegia, difficulty with speech presents after a fall from  sitting height.  Patient has been pain in multiple areas, but is awake, alert, seemingly at baseline neurologic condition.  Concern for fracture versus soft tissue injury, less likely new stroke, as the patient reportedly stumbled.  X-ray CT IM medication all started after evaluation  Amount and/or Complexity of Data Reviewed Independent Historian:     Details: Nursing home notes reviewed External Data Reviewed: notes. Radiology: ordered. Decision-making details documented in ED Course.    Details: Demonstrated the x-rays, CT scans to the patient at bedside, and including illustration of the right distal fibula fracture. ECG/medicine tests:  Decision-making details documented in ED Course. Discussion of management or test interpretation with external  provider(s): ECG unremarkable  Risk OTC drugs. Prescription drug management. Decision regarding hospitalization.   7:50 PM Patient awake, alert, speaking in an animated, though largely unintelligible fashion.  We communicated the importance of following up with orthopedic colleagues, patient will have immobilization with a cam walker boot, analgesics, absent other evidence for injury, including unremarkable head CT, neck CT, multiple other x-rays, though hospitalization was a consideration given her prior episodes, patient is appropriate for further evaluation, monitoring, management as an outpatient.        Final Clinical Impression(s) / ED Diagnoses Final diagnoses:  Closed fracture of right ankle, initial encounter    Rx / DC Orders ED Discharge Orders     None         Carmin Muskrat, MD 01/27/22 1951

## 2022-01-27 NOTE — ED Triage Notes (Signed)
Pt via EMS from [facility] after sustaining a fall while trying to transfer into bed unassisted. Pt c/o right ankle pain with deformity and forehead pain. No blood thinners. Previous stroke with right-sided deficit. Pt has hemiparesis and expressive aphasia. Responsive to commands but speech is garbled at baseline. Vitals WNL  BP 150/98 O2 97% room air HR 93

## 2022-04-23 ENCOUNTER — Encounter: Payer: Self-pay | Admitting: Physical Medicine & Rehabilitation

## 2022-04-25 ENCOUNTER — Encounter: Payer: Self-pay | Admitting: Physical Medicine & Rehabilitation

## 2022-04-25 ENCOUNTER — Encounter: Payer: Medicare Other | Attending: Physical Medicine & Rehabilitation | Admitting: Physical Medicine & Rehabilitation

## 2022-04-25 VITALS — BP 132/83 | HR 78 | Ht 64.0 in

## 2022-04-25 DIAGNOSIS — I69359 Hemiplegia and hemiparesis following cerebral infarction affecting unspecified side: Secondary | ICD-10-CM

## 2022-04-25 DIAGNOSIS — M21861 Other specified acquired deformities of right lower leg: Secondary | ICD-10-CM | POA: Diagnosis present

## 2022-04-25 NOTE — Progress Notes (Signed)
Subjective:    Patient ID: Tammie Miles, female    DOB: 20-Mar-1975, 47 y.o.   MRN: [id number]  HPI HPI  Tammie Miles is a 47 y.o. 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   They are presenting to PM&R clinic as a new patient for R knee hyperextension.  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  She has had Hemi plegia and aphasia since this time.  This August she had a fall which resulted in a distal lateral malleolus avulsion fracture on the right.  She was seen by orthopedics Dr. Charlies Constable.  No surgery was indicated per Ortho and her cam boot was discontinued.  She was noted to have knee hyperextension and plantarflexion contraction on the right.  She reports she continues to have left ankle pain and also has developed knee pain on the right.  They are giving her Tylenol and NSAIDs at her SNF.  She does ambulate short distances with a cane, she does not have this with her today.  She is currently on baclofen 10 mg 3 times daily.   Pain Inventory Average Pain 8 Pain Right Now 9 My pain is stabbing and aching  In the last 24 hours, has pain interfered with the following? General activity 9 Relation with others 5 Enjoyment of life 7 What TIME of day is your pain at its worst? morning  and daytime Sleep (in general) Poor  Pain is worse with: walking, bending, sitting, and standing Pain improves with:  . Relief from Meds: 0  walk with assistance ability to climb steps?  no do you drive?  no use a wheelchair transfers alone  disabled: date disabled 01/20/2018 I  need assistance with the following:  dressing and bathing  trouble walking spasms  Any changes since last visit?  no  Any changes since last visit?  no    Family History  Problem Relation Age of Onset   Hypertension Mother    Hypertension Father    Breast cancer Maternal Aunt    Social History   Socioeconomic History   Marital status: Married    Spouse name: Not on file   Number of children: 1   Years of education: Not on file   Highest education level: Not on file  Occupational History   Not on file  Tobacco Use   Smoking status: Never   Smokeless tobacco: Never  Vaping Use   Vaping Use: Never used  Substance and Sexual Activity   Alcohol use: Not Currently   Drug use: Not Currently   Sexual activity: Not Currently  Other Topics Concern   Not on file  Social History Narrative   Not on file   Social Determinants of Health   Financial Resource Strain: Not on file  Food Insecurity: Not on file  Transportation Needs: Not on file  Physical Activity: Not on file  Stress: Not on file  Social Connections: Not on file   Past Surgical History:  Procedure Laterality Date   CRANIOPLASTY Left 01/22/2019   Procedure: CRANIOPLASTY;  Surgeon: Consuella Lose, MD;  Location: [facility];  Service: Neurosurgery;  Laterality: Left;  CRANIOPLASTY   CRANIOTOMY Left 11/27/2017  Procedure: CRANIOTOMY for Evacuation of Intracranial Hemorrhage;  Surgeon: Consuella Lose, MD;  Location: [facility];  Service: Neurosurgery;  Laterality: Left;  CRANIOTOMY for Evacuation of Intracranial Hemorrhage   CRANIOTOMY Left 11/28/2017   Procedure: Decompressive craniectomy;  Surgeon: Erline Levine, MD;  Location: [facility];  Service: Neurosurgery;  Laterality: Left;   ESOPHAGOGASTRODUODENOSCOPY N/A 12/09/2017   Procedure: ESOPHAGOGASTRODUODENOSCOPY (EGD);  Surgeon: Georganna Skeans, MD;  Location: [facility] ENDOSCOPY;  Service: General;  Laterality: N/A;   KNEE ARTHROSCOPY WITH MENISCAL REPAIR Right 08/13/2019    Procedure: KNEE ARTHROSCOPY WITH PARTIAL MENISECTOMY, CHONDRAPLASTY MEDIAL LATERAL, PATELLA-FEMORAL;  Surgeon: Dorna Leitz, MD;  Location: [facility];  Service: Orthopedics;  Laterality: Right;   KNEE SURGERY Bilateral    PEG PLACEMENT N/A 12/09/2017   Procedure: PERCUTANEOUS ENDOSCOPIC GASTROSTOMY (PEG) PLACEMENT;  Surgeon: Georganna Skeans, MD;  Location: [facility];  Service: General;  Laterality: N/A;   Past Medical History:  Diagnosis Date   Acute posthemorrhagic anemia    hx of    ADHD    Anxiety    Aphasia    Aphasia following unspecified cerebrovascular disease    Arthritis    Depression    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[place])    Hemiplegia affecting right nondominant side ([place])    Hypertension    Internal derangement of left knee    Seizures ([place])    Stroke ([place])    cerebral infarction due to occlusion or stenosis of left middle cerebral artery    BP 132/83   Pulse 78   Ht 5' 4"$  (1.626 m)   SpO2 97%   BMI 41.20 kg/m   Opioid Risk Score:   Fall Risk Score:  `1  Depression screen [facility] 2/9     04/25/2022   10:30 AM  Depression screen [facility] 2/9  Decreased Interest 0  Down, Depressed, Hopeless 0  PHQ - 2 Score 0  Altered sleeping 0  Tired, decreased energy 0  Change in appetite 0  Feeling bad or failure about yourself  0  Trouble concentrating 0  Moving slowly or fidgety/restless 0  Suicidal thoughts 0  PHQ-9 Score 0  Difficult doing work/chores Not difficult at all      Review of Systems  Musculoskeletal:  Positive for gait problem.  All other systems reviewed and are negative.      Objective:   Physical Exam    Gen: no distress, normal appearing HEENT: oral mucosa pink and moist, NCAT Cardio: Reg rate Chest: normal effort, normal rate of breathing Abd: soft, non-distended Ext: no edema Psych: pleasant, normal affect Skin: intact Neuro: alert and awake, expressive aphasia, follows commands Strength 5/5 in LUE and LLE Strength 0/5 RUE throughout Strength  Right 4/5 in hip flexor, 4-/5 knee extensor, ankle PF 4-/5 and DF 2-3/5 Musculoskeletal:   Increased tone in RUE finger flexors and pronators Ankle FP contraction, increased tone , unable to DF past neutral R knee hyperextension about 20 degrees  Gait- (provided RW) walked with ankle plantarflexed, knee hyperextends during gait    Assessment & Plan:  Assessment and Plan: Tammie Miles is a 47 y.o. 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

## 2022-05-07 NOTE — Progress Notes (Deleted)
[facility] [address] [place]. 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     HPI:  Update 05/08/2022 JM: Patient returns for 46-monthstroke follow-up.  [facility]  Stable from stroke standpoint.  No new stroke/TIA symptoms.  Residual right spastic hemiparesis and aphasia stable.  Continues on baclofen and gabapentin for spasticity.  Denies any seizure activity on Keppra.  Compliant on aspirin atorvastatin.  Blood pressure ***.  Was scheduled to complete HST 9/19 but was never completed ***.       History provided for reference purposes only Update 10/31/2021 JM: Patient returns for stroke follow-up after prior visit 8 months ago accompanied by SNF staff.  [facility]  Stable from stroke standpoint without new stroke/TIA symptoms.  Residual right spastic hemiparesis and aphasia stable.  Reports continued right arm pain as well as frequent headaches.  Increase gabapentin dosage at prior visit but doesn't seem this made much different.  She is also on baclofen '10mg'$  BID, venlafaxine 150 mg daily, and Tylenol with codeine.  Describes pain is more of an achy sensation.  Denies any seizure activity, remains on Keppra, denies side effects.  Compliant on aspirin and atorvastatin.  Blood pressure elevated at today's visit but typically stable at SNF.  Was seen by Dr. DBrett Fairy3/9 for possible underlying sleep apnea, office has been unable to contact SNF to schedule sleep study.  No further concerns at this time  Update, 03/01/2021, JM: [name] here today for a stroke follow-up accompanied by [facility] facility staff [facility] She is still a resident at [facility] No current  therapies. Reports stable aphasia and right spastic hemiparesis, no new stroke/TIA symptoms. 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  Continued frontal headaches. MR brain not completed which was ordered at prior visit. Unable to be scheduled for sleep evaluation (headaches more frequent in the AM). Questionable concerns of visual changes at prior visit - unsure if seen by ophthalmology after prior visit. Denies seizure activity. Remains on aspirin, atorvastatin, and keppra - denies side effects. BP today 148/95. Last A1C 6.3 and LDL 64  (10/31/20).  No further concerns at this time   Update, 10/31/2020, [name] for 649-monthtroke follow-up accompanied by her aunt, [facility] facility staff, [facility].  Reports continued aphasia and right spastic hemiparesis which has been stable.  Does complain of frontal headaches present over the past 4 months usually present upon awakening and gradually improves throughout the day. Does endorse vision changes ?blurred vision but unable to verify how long present. She has not been seen by ophthalmologist. Unable to verify if photophobia or phonophobia present.  She does endorse insomnia and daytime fatigue. Denies any seizure activity. Per MAR, remains on aspirin and atorvastatin as well as Keppra 1000 mg twice daily.  Blood pressure today 129/89.  No further concerns at this time.  Update 05/01/2020 JM: [name] for 6-30-monthroke follow-up unaccompanied.  [facility]  Residual aphasia and right spastic hemiparesis -denies worsening Able to ambulate short distance otherwise primarily wheelchair-bound Continues to experience bilateral knee pain which interferes with ambulation but is able to ambulate short distance with rolling  walker Denies new stroke/TIA symptoms  Remains on Keppra 1000 mg twice daily -denies side effects Denies recent seizure activity  Reports compliance on aspirin 81 mg daily and atorvastatin 20 mg daily -denies side effects Blood pressure today initially elevated at 153/102 and on recheck 126/88 No concerns at this time  Update 10/28/2019 JM: [name] returns for stroke follow-up accompanied by her aunt. Stable from stroke standpoint with residual severe aphasia expressive> receptive and right spastic hemiparesis She is frustrated with her continued deficits and lack of therapy.  Primarily wheelchair-bound but is able to ambulate short distance Evaluated by ENT which showed mild subglottic stenosis but felt not impacting language difficulty Denies new or worsening stroke/TIA symptoms Remains on Keppra 1000 mg twice daily without seizure activity.  Denies side effects Continues on aspirin 81 mg daily without bleeding or bruising Continues on atorvastatin 20 mg daily without myalgias Blood pressure today 129/80 HTN and HLD managed by facility Underwent right knee arthroscopy with meniscal debridement on 08/13/2019 Does report mild improvement of pain No further concerns  Update 06/28/2019 JM: [name] returns for follow-up with history of stroke as well as discussion regarding undergoing left knee replacement procedure.  She is accompanied today by her aunt. She continues to reside at [facility] with residual stroke deficits of right hemiparesis and aphasia which have been stable with patient reporting some improvement.  She has not been participating in therapies which she is frustrated about.  Per aunt, therapy discontinued due to lack of staff.  History of chronic knee pain previously undergoing right knee arthroplasty and has been having greater difficulty with left knee without benefit from injections.  Continues to follow with orthopedics and per aunt, concerns regarding recovery in setting  of residual stroke deficits.  Ambulates short distance but limited due to residual right hemiparesis and L>R knee pain.  Continues on Keppra 1000 mg twice daily tolerating well without recurrent seizure activity.  She underwent cranioplasty on [id number] without complication.  Continues on aspirin 81 mg daily and atorvastatin 20 mg daily for secondary stroke prevention.  Blood pressure today 138/89.  No concerns at this time.  Update 11/26/2018 JM: [name] is being seen today for stroke follow-up accompanied by her aunt.  Prior follow-up appointments canceled/rescheduled due to COVID-19 safety restrictions and difficulty with scheduling virtual visit.  She continues to reside at [facility].  Residual stroke deficits of right hemiparesis and severe expressive > mild to moderate receptive aphasia.  She is ambulatory for very short distances and assistance needed.  Aunt states she was doing well with improvement of ambulation but this worsened after seizure activity in 05/2018 and lack of therapy.  She was admitted to ED on 05/20/2018 due to seizure activity but denies additional seizure since that time.  She continues on Keppra without side effects.  Referral placed at prior visit to follow-up with neurosurgery but unfortunately due to difficulty getting a hold of patient/facility, appointment was never made. Skullflap located right abdomen.  She continues on aspirin 81 mg daily for secondary stroke prevention without side effects.  Blood pressure today satisfactory at 135/89.  Denies new or worsening stroke/TIA symptoms.  Initial visit 03/05/2018 JM: Patient is being seen today for hospital follow-up and is accompanied by her aunt.  She continues to  reside at [facility] and rehabilitation center.  She continues to receive PT/OT/ST. she continues to have LUE flaccid and expressive > receptive aphasia.  Severe expressive aphasia present with only being able to say "no" but is able to nod her  head appropriately.  Mild to moderate receptive aphasia present.  She has had PEG tube removed and is currently obtaining all nutrition and pills orally.  She denies any complication in doing so and denies any aspiration or coughing with eating.  She is able to ambulate without assistance and denies any recent falls.  She needs minimal assistance with ADLs such as bathing and dressing.  She does feel as though she has been improving.  Continues on aspirin 81 mg without side effects of bleeding or bruising.  She continues on Keppra without recurrent seizures and has been tolerating well.  Blood pressure today satisfactory 135/82.  She denies follow-up appointment with neurosurgery since hospital discharge as recommended.  She does have skullcap placed right side of abdomen.  No further concerns at this time.  Denies new or worsening stroke/TIA symptoms.  Stroke admission: Tammie Miles is a 47 y.o. Female with PMH of HTN on hypertensive medications, antidepressants, stimulants, and Xanax which are all prescribed by her primary physician.  Per discharge summary notes by Dr. Leonie Man, after awakening from a nap on 11/26/2017 she was noted to not be acting correctly and was dropping things.  Her 63-year-old daughter called the father multiple times and when he went and checked on her he believes that she was drunk and she was obtunded but moving her arms.  The following morning on 11/27/2017, she remained obtunded and the husband called 911 where she was brought into the emergency room via EMS.  Initial CT head reviewed and showed large area of hemorrhagic left basal ganglia and left frontal lobe was noted with a 5.8 x 3.5 cm along with 15 mm shift from the left to right with early herniation.  Neurosurgery was immediately called for decompression.  Initial hematoma evacuation performed and after neuro worsening, hemicraniectomy was performed a few days later.  Initial insult felt to be ischemic with large hemorrhagic  transformation and continued bleeding.  Unfortunately, patient had a prolonged hospitalization due to lack of family support and insurance for rehab.  Patient was started on Keppra for seizure prevention after episode of stiffness and tongue biting on 12/03/2017.  EEG did not show seizure activity.  Due to acute respiratory insufficiency, patient was intubated and did undergo tracheostomy on 12/09/2017.  Blood pressure initially found to be elevated on admission but stabilized throughout hospitalization.  Due to continued dysphasia, PEG tube placed on 12/09/2017 for nutritional support.  Aspirin 81 mg initiated for secondary stroke prevention.  Therapies recommended SNF but due to lack of bed placement, patient was transferred to CIR for ongoing PT/OT/ST.  During CIR stay, patient was decannulated on 01/09/2018.  Due to continued therapy needs, patient was discharged to SNF [facility] rehabilitation center on 01/20/2018.      ROS:   N/A d/t language barrier  PMH:  Past Medical History:  Diagnosis Date   Acute posthemorrhagic anemia    hx of    ADHD    Anxiety    Aphasia    Aphasia following unspecified cerebrovascular disease    Arthritis    Depression    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[place])    Hemiplegia affecting right nondominant side (HCC)    Hypertension    Internal derangement of left  knee    Seizures ([place])    Stroke [facility])    cerebral infarction due to occlusion or stenosis of left middle cerebral artery     PSH:  Past Surgical History:  Procedure Laterality Date   CRANIOPLASTY Left 01/22/2019   Procedure: CRANIOPLASTY;  Surgeon: Consuella Lose, MD;  Location: [facility];  Service: Neurosurgery;  Laterality: Left;  CRANIOPLASTY   CRANIOTOMY Left 11/27/2017   Procedure: CRANIOTOMY for Evacuation of Intracranial Hemorrhage;  Surgeon: Consuella Lose, MD;  Location: [place];  Service: Neurosurgery;  Laterality: Left;  CRANIOTOMY for Evacuation of Intracranial Hemorrhage   CRANIOTOMY  Left 11/28/2017   Procedure: Decompressive craniectomy;  Surgeon: Erline Levine, MD;  Location: [facility];  Service: Neurosurgery;  Laterality: Left;   ESOPHAGOGASTRODUODENOSCOPY N/A 12/09/2017   Procedure: ESOPHAGOGASTRODUODENOSCOPY (EGD);  Surgeon: Georganna Skeans, MD;  Location: [facility] ENDOSCOPY;  Service: General;  Laterality: N/A;   KNEE ARTHROSCOPY WITH MENISCAL REPAIR Right 08/13/2019   Procedure: KNEE ARTHROSCOPY WITH PARTIAL MENISECTOMY, CHONDRAPLASTY MEDIAL LATERAL, PATELLA-FEMORAL;  Surgeon: Dorna Leitz, MD;  Location: [facility];  Service: Orthopedics;  Laterality: Right;   KNEE SURGERY Bilateral    PEG PLACEMENT N/A 12/09/2017   Procedure: PERCUTANEOUS ENDOSCOPIC GASTROSTOMY (PEG) PLACEMENT;  Surgeon: Georganna Skeans, MD;  Location: [facility] ENDOSCOPY;  Service: General;  Laterality: N/A;    Social History:  Social History   Socioeconomic History   Marital status: Married    Spouse name: Not on file   Number of children: 1   Years of education: Not on file   Highest education level: Not on file  Occupational History   Not on file  Tobacco Use   Smoking status: Never   Smokeless tobacco: Never  Vaping Use   Vaping Use: Never used  Substance and Sexual Activity   Alcohol use: Not Currently   Drug use: Not Currently   Sexual activity: Not Currently  Other Topics Concern   Not on file  Social History Narrative   Not on file   Social Determinants of Health   Financial Resource Strain: Not on file  Food Insecurity: Not on file  Transportation Needs: Not on file  Physical Activity: Not on file  Stress: Not on file  Social Connections: Not on file  Intimate Partner Violence: Not on file    Family History:  Family History  Problem Relation Age of Onset   Hypertension Mother    Hypertension Father    Breast cancer Maternal Aunt     Medications:   Current Outpatient Medications on File Prior to Visit  Medication Sig Dispense Refill   acetaminophen (TYLENOL) 500 MG tablet Take  1,000 mg by mouth 3 (three) times daily.     acetaminophen-codeine (TYLENOL #3) 300-30 MG tablet Take 1 tablet by mouth 2 (two) times daily as needed for moderate pain.     atenolol-chlorthalidone (TENORETIC) 100-25 MG tablet Take 1 tablet by mouth daily.     atorvastatin (LIPITOR) 40 MG tablet Take 40 mg by mouth at bedtime.     b complex vitamins tablet Take 1 tablet by mouth daily.     baclofen (LIORESAL) 10 MG tablet Take 10 mg by mouth in the morning and at bedtime.     diclofenac Sodium (VOLTAREN) 1 % GEL Apply topically as needed.     ergocalciferol (VITAMIN D2) 1.25 MG (50000 UT) capsule Take 50,000 Units by mouth once a week.     gabapentin (NEURONTIN) 300 MG capsule Take 1 capsule (300 mg total) by mouth 3 (three)  times daily. 90 capsule 5   HYDROcodone-acetaminophen (NORCO/VICODIN) 5-325 MG tablet Take 1 tablet by mouth every 6 (six) hours as needed for severe pain. 8 tablet 0   hydrOXYzine (VISTARIL) 50 MG capsule Take 50 mg by mouth daily as needed.     lactulose (CHRONULAC) 10 GM/15ML solution Take by mouth.     levETIRAcetam (KEPPRA) 1000 MG tablet Take 1,000 mg by mouth every 12 (twelve) hours.     lidocaine (LMX) 4 % cream Apply 1 application  topically See admin instructions. Apply to old trach site once daily, may use an additional 2 times daily as needed for pain at old trach site     lisinopril (ZESTRIL) 2.5 MG tablet Take 2.5 mg by mouth daily.     metFORMIN (GLUCOPHAGE-XR) 500 MG 24 hr tablet Take 500 mg by mouth daily.     naproxen sodium (ALEVE) 220 MG tablet Take 220 mg by mouth 2 (two) times daily as needed (pain).     polyethylene glycol (MIRALAX / GLYCOLAX) 17 g packet Take 17 g by mouth daily as needed for moderate constipation. Mix in 4-8 oz liquid and drink     potassium chloride (KLOR-CON) 10 MEQ tablet Take 10 mEq by mouth 2 (two) times daily.     potassium chloride SA (KLOR-CON) 20 MEQ tablet Take 20 mEq by mouth every Monday, Wednesday, and Friday.      traZODone (DESYREL) 100 MG tablet Take 100 mg by mouth at bedtime.     Venlafaxine HCl 150 MG TB24 Take 1 tablet by mouth daily.     No current facility-administered medications on file prior to visit.    Allergies:  No Known Allergies   Physical Exam  There were no vitals filed for this visit.  There is no height or weight on file to calculate BMI. No results found.   General: well developed, well nourished, pleasant African-American female, seated, in no evident distress Neck: supple with no carotid or supraclavicular bruits Cardiovascular: regular rate and rhythm, no murmurs Vascular:  Normal pulses all extremities  Neurologic Exam Mental Status: Awake and fully alert. Severe expressive aphasia with mild receptive aphasia.  No evidence of cognitive difficulties.  Able to say yes and no appropriately.  Mood and affect appropriate and cooperative with exam Cranial Nerves: Pupils equal, briskly reactive to light. Extraocular movements full without nystagmus. Visual fields blink to threat bilaterally.  Hearing intact. Facial sensation intact.  Right lower facial weakness. Motor: Full strength left upper and lower extremity RUE: 1/5 with spasticity greater in deltoid and fingers RLE: 3/5 ankle dorsiflexion weakness. 5/5 hip flexor, knee extension and flexion Sensory.: intact to touch , pinprick , position and vibratory sensation.  Coordination: Rapid alternating movements normal except RUE. Finger-to-nose and heel-to-shin performed accurately on left side Gait and Station: Deferred Reflexes: 2+ right side and 1+ right side. Toes downgoing.       ASSESSMENT: Tammie Miles is a 47 y.o. 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  Seizure activity 11/2017 placed on  Keppra.  Cranioplasty on [id number] without complication.  Initial insult felt to be ischemic with large hemorrhagic transformation and continued bleeding.  Hospitalization complicated by acute respiratory insufficiency status post trach with decannulation, seizure, hypertensive emergency, anemia, and dysphagia s/p PEG. Vascular risk factors include HTN and  depression.      PLAN:  Left MCA infarct with hemorrhagic transformation:  Residual deficits: Severe expressive aphasia, mild receptive aphasia and right spastic hemiparesis - stable  Recommend SNF consider increasing baclofen dosage from 10 mg twice daily to 10 mg 3 times daily for continued arm pain.  Continue gabapentin 300 mg 3 times daily Continue aspirin 81 mg daily and atorvastatin 40 mg daily for secondary stroke prevention Discussed secondary stroke prevention measures and importance of close PCP follow-up for aggressive stroke risk factor management including BP goal<130/90, HLD with LDL goal<70 and DM with A1c.<7   Chronic headaches:  Continued frontal headaches over the past year HST scheduled today prior to leaving office which is scheduled on 9/19 If apnea found, could be contributing to headaches and may improve after treatment If negative for apnea, may need to consider headache prophylactic therapy  Seizure, onset of stroke:  No recurrent events Continuation of Keppra 1000 mg twice daily for seizure prophylaxis Last seizure activity 05/2018    Follow up in 6 months or call earlier if needed   CC:  Raymondo Band, MD    I spent 31 minutes of face-to-face and non-face-to-face time with patient and caregiver.  This included previsit chart review, lab review, study review, electronic health record documentation, patient education regarding prior stroke, importance of managing stroke risk factors, residual deficits, post stroke seizure, chronic headaches and RUE pain and answered all other questions to patients  satisfaction.   Frann Rider, AGNP-BC  [facility] [address] [place], [place] [address]  Phone [phone] Fax [phone] Note: This document was prepared with digital dictation and possible smart phrase technology. Any transcriptional errors that result from this process are unintentional.

## 2022-05-08 ENCOUNTER — Ambulatory Visit: Payer: Medicare Other | Admitting: Adult Health

## 2022-05-08 ENCOUNTER — Encounter: Payer: Self-pay | Admitting: Adult Health

## 2022-06-10 ENCOUNTER — Encounter: Payer: Medicare Other | Attending: Physical Medicine & Rehabilitation | Admitting: Physical Medicine & Rehabilitation

## 2022-06-10 DIAGNOSIS — I69359 Hemiplegia and hemiparesis following cerebral infarction affecting unspecified side: Secondary | ICD-10-CM | POA: Insufficient documentation

## 2022-06-10 DIAGNOSIS — M21861 Other specified acquired deformities of right lower leg: Secondary | ICD-10-CM | POA: Insufficient documentation

## 2022-08-14 ENCOUNTER — Other Ambulatory Visit: Payer: Self-pay | Admitting: Family Medicine

## 2022-08-14 DIAGNOSIS — Z0000 Encounter for general adult medical examination without abnormal findings: Secondary | ICD-10-CM

## 2022-09-03 ENCOUNTER — Ambulatory Visit
Admission: RE | Admit: 2022-09-03 | Discharge: 2022-09-03 | Disposition: A | Discharge status: Home / Self Care | Payer: Medicare Other | Source: Ambulatory Visit | Attending: Family Medicine | Admitting: Family Medicine

## 2022-09-03 DIAGNOSIS — Z0000 Encounter for general adult medical examination without abnormal findings: Secondary | ICD-10-CM

## 2022-09-06 ENCOUNTER — Other Ambulatory Visit: Payer: Self-pay | Admitting: Family Medicine

## 2022-09-06 DIAGNOSIS — R928 Other abnormal and inconclusive findings on diagnostic imaging of breast: Secondary | ICD-10-CM

## 2022-09-25 ENCOUNTER — Other Ambulatory Visit (INDEPENDENT_AMBULATORY_CARE_PROVIDER_SITE_OTHER): Payer: Medicare Other

## 2022-09-25 ENCOUNTER — Encounter: Payer: Self-pay | Admitting: Orthopaedic Surgery

## 2022-09-25 ENCOUNTER — Other Ambulatory Visit: Payer: Self-pay

## 2022-09-25 ENCOUNTER — Ambulatory Visit: Payer: Medicare Other | Admitting: Orthopaedic Surgery

## 2022-09-25 DIAGNOSIS — M25571 Pain in right ankle and joints of right foot: Secondary | ICD-10-CM | POA: Diagnosis not present

## 2022-09-25 DIAGNOSIS — S82891A Other fracture of right lower leg, initial encounter for closed fracture: Secondary | ICD-10-CM

## 2022-09-25 NOTE — Progress Notes (Signed)
Office Visit Note   Patient: Tammie Miles           Date of Birth: 08-24-1975           MRN: [id number] Visit Date: 09/25/2022              Requested by: Karna Dupes, MD [address] [place],  [place] [address] PCP: Karna Dupes, MD   Assessment & Plan: Visit Diagnoses:  1. Closed avulsion fracture of right ankle, initial encounter   2. Pain in right ankle and joints of right foot     Plan: Impression is right ankle pain could be due to sprain or reaggravation of chronic distal fibula nonunion.  At this point, I believe she would benefit from a cam boot.  we will also provide her with an ASO brace to transition to.  She will continue to ice and elevate.  She will follow-up with [place] as needed.  Call with concerns or questions.  Follow-Up Instructions: Return if symptoms worsen or fail to improve.   Orders:  Orders Placed This Encounter  Procedures   XR Ankle Complete Right   No orders of the defined types were placed in this encounter.     Procedures: No procedures performed   Clinical Data: No additional findings.   Subjective: Chief Complaint  Patient presents with   Right Ankle - Pain    HPI patient is a pleasant 47 year old female who comes in today with right ankle pain.  She sustained an avulsion fracture to the lateral malleolus back in the fall.  She was doing well until recently.  She sustained another fall but is unsure exactly how or when but thinks this was within the past week.  The pain she has is to the entire ankle.  She has associated swelling.  She is primarily ambulating in a wheelchair but is able to stand for transfers.  She does have some pain with this.  She notes that she is not getting any pain medication for this.  Of note, she is status post CVA with right-sided weakness.  She is living a resident of [facility].  Review of Systems as detailed in HPI.  All others reviewed and are negative.   Objective: Vital Signs: There  were no vitals taken for this visit.  Physical Exam well-developed well-nourished female no acute distress.  Alert and oriented x 3.  Ortho Exam right ankle exam: Mild to moderate swelling.  Mild tenderness throughout the medial and lateral ankle as well as the anterior aspect.    Specialty Comments:  No specialty comments available.  Imaging: XR Ankle Complete Right  Result Date: 09/25/2022 X-rays demonstrate a fracture of the tip of the distal fibula of unchanged appearance compared to prior x-rays from 8 months ago    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/17/2021   Disability due to neurological disorder 05/17/2021   Aphasia as late effect of cerebrovascular accident (CVA) 05/17/2021   Reactive depression 05/17/2021   Excessive daytime sleepiness 05/17/2021   Skull defect 01/22/2019   Cerebral edema (HCC) 01/16/2018   At high risk for injury related to fall 01/16/2018   Seizures (HCC) 01/16/2018   Attention deficit hyperactivity disorder (ADHD) 01/16/2018   Generalized anxiety disorder 01/16/2018   Depression 01/16/2018   Tracheostomy in place [facility])  Pressure injury of skin 12/29/2017   Altered mental status    Central venous catheter in place    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/02/2017   Stroke (cerebrum) (HCC) 12/02/2017   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   Hypertensive emergency 12/02/2017   Anemia 12/02/2017   Leukocytosis 12/02/2017   Acute intra-cranial hemorrhage (HCC)    Respiratory failure (HCC)    ICH (intracerebral hemorrhage) (HCC) 11/27/2017   Intracranial hemorrhage (HCC) 11/27/2017   Past Medical History:  Diagnosis Date   Acute posthemorrhagic anemia    hx of    ADHD    Anxiety    Aphasia    Aphasia following unspecified cerebrovascular disease     Arthritis    Depression    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HCC)    Hemiplegia affecting right nondominant side (HCC)    Hypertension    Internal derangement of left knee    Seizures (HCC)    Stroke (HCC)    cerebral infarction due to occlusion or stenosis of left middle cerebral artery     Family History  Problem Relation Age of Onset   Hypertension Mother    Hypertension Father    Breast cancer Maternal Aunt     Past Surgical History:  Procedure Laterality Date   CRANIOPLASTY Left 01/22/2019   Procedure: CRANIOPLASTY;  Surgeon: Lisbeth Renshaw, MD;  Location: [facility] OR;  Service: Neurosurgery;  Laterality: Left;  CRANIOPLASTY   CRANIOTOMY Left 11/27/2017   Procedure: CRANIOTOMY for Evacuation of Intracranial Hemorrhage;  Surgeon: Lisbeth Renshaw, MD;  Location: [facility] OR;  Service: Neurosurgery;  Laterality: Left;  CRANIOTOMY for Evacuation of Intracranial Hemorrhage   CRANIOTOMY Left 11/28/2017   Procedure: Decompressive craniectomy;  Surgeon: Maeola Harman, MD;  Location: [facility] OR;  Service: Neurosurgery;  Laterality: Left;   ESOPHAGOGASTRODUODENOSCOPY N/A 12/09/2017   Procedure: ESOPHAGOGASTRODUODENOSCOPY (EGD);  Surgeon: Violeta Gelinas, MD;  Location: [facility] ENDOSCOPY;  Service: General;  Laterality: N/A;   KNEE ARTHROSCOPY WITH MENISCAL REPAIR Right 08/13/2019   Procedure: KNEE ARTHROSCOPY WITH PARTIAL MENISECTOMY, CHONDRAPLASTY MEDIAL LATERAL, PATELLA-FEMORAL;  Surgeon: Jodi Geralds, MD;  Location: [facility] ORS;  Service: Orthopedics;  Laterality: Right;   KNEE SURGERY Bilateral    PEG PLACEMENT N/A 12/09/2017   Procedure: PERCUTANEOUS ENDOSCOPIC GASTROSTOMY (PEG) PLACEMENT;  Surgeon: Violeta Gelinas, MD;  Location: [facility] ENDOSCOPY;  Service: General;  Laterality: N/A;   Social History   Occupational History   Not on file  Tobacco Use   Smoking status: Never   Smokeless tobacco: Never  Vaping Use   Vaping status: Never Used  Substance and Sexual Activity   Alcohol use: Not Currently   Drug use: Not  Currently   Sexual activity: Not Currently

## 2022-10-09 ENCOUNTER — Ambulatory Visit
Admission: RE | Admit: 2022-10-09 | Discharge: 2022-10-09 | Disposition: A | Discharge status: Home / Self Care | Payer: Medicare Other | Source: Ambulatory Visit | Attending: Family Medicine | Admitting: Family Medicine

## 2022-10-09 DIAGNOSIS — R928 Other abnormal and inconclusive findings on diagnostic imaging of breast: Secondary | ICD-10-CM

## 2023-10-10 ENCOUNTER — Encounter: Payer: Self-pay | Admitting: Gastroenterology

## 2023-10-27 ENCOUNTER — Ambulatory Visit (AMBULATORY_SURGERY_CENTER)

## 2023-10-27 VITALS — Ht 64.0 in | Wt 190.0 lb

## 2023-10-27 DIAGNOSIS — Z1211 Encounter for screening for malignant neoplasm of colon: Secondary | ICD-10-CM

## 2023-10-27 MED ORDER — BISACODYL EC 5 MG PO TBEC: 5.0000 mg | DELAYED_RELEASE_TABLET | ORAL | 0 refills | Status: AC

## 2023-10-27 MED ORDER — BISACODYL EC 5 MG PO TBEC: 5.0000 mg | DELAYED_RELEASE_TABLET | ORAL | 0 refills | Status: DC

## 2023-10-27 MED ORDER — PEG 3350-KCL-NA BICARB-NACL 420 G PO SOLR: 4000.0000 mL | Freq: Once | ORAL | 0 refills | Status: AC

## 2023-10-27 NOTE — Progress Notes (Signed)
 No egg or soy allergy known to patient  No issues known to pt with past sedation with any surgeries or procedures Patient denies ever being told they had issues or difficulty with intubation  No FH of Malignant Hyperthermia Pt is not on diet pills Pt is not on  home 02  Pt is not on blood thinners  Occasional constipation reported via nursing staff taking Miralax  and stool softener daily  No A fib or A flutter Have any cardiac testing pending-- no  LOA: independent  Prep: suprep   Patient's chart reviewed by Norleen Schillings CNRA prior to previsit and patient appropriate for the [facility].  Previsit completed and red dot placed by patient's name on their procedure day (on provider's schedule).     PV completed with nursing staff at [facility] and rehab. Prep instructions sent to nursing facility. Rx has been faxed to facility per request to be filled.

## 2023-11-13 ENCOUNTER — Ambulatory Visit (AMBULATORY_SURGERY_CENTER): Admitting: Gastroenterology

## 2023-11-13 ENCOUNTER — Encounter: Payer: Self-pay | Admitting: Gastroenterology

## 2023-11-13 VITALS — BP 123/77 | HR 78 | Temp 97.9°F | Ht 64.0 in | Wt 193.0 lb

## 2023-11-13 DIAGNOSIS — Z1211 Encounter for screening for malignant neoplasm of colon: Secondary | ICD-10-CM

## 2023-11-13 MED ORDER — SODIUM CHLORIDE 0.9 % IV SOLN: 500.0000 mL | Freq: Once | INTRAVENOUS | Status: DC

## 2023-11-13 NOTE — Progress Notes (Unsigned)
 Per pt and caregiver, pt ate 2 bowls of cereal this morning.  States that the facility pt lives in did not receive instructions for her to be on clear liquids after prepping.  Pt was rescheduled for tomorrow morning at 730 with Dr. San.  Pt given instructions for a split miralax  prep. Reviewed with pt and care partner.

## 2023-11-13 NOTE — Progress Notes (Unsigned)
 During admission process, patient's caregiver informed me that Burnard ate 2 bowls of cereal this morning.  Chanetta verified that she did eat this morning.  Her last bowel movement was brown liquid.  Spoke with Dr. Cirigliano and this procedure will be canceled.  Patient and CNA caregiver updated and time allowed for questions.  Natalie, Consulting civil engineer, will order another prep and provide prep instructions for [facility] rehab facility.  Patient will be rescheduled for 0730 tomorrow, 11/14/23.  Patient and care giver in agreement.

## 2023-11-14 ENCOUNTER — Encounter: Payer: Self-pay | Admitting: Gastroenterology

## 2023-11-14 ENCOUNTER — Ambulatory Visit: Admitting: Gastroenterology

## 2023-11-14 VITALS — BP 143/86 | HR 64 | Temp 98.0°F | Resp 24 | Ht 63.0 in | Wt 193.0 lb

## 2023-11-14 DIAGNOSIS — K641 Second degree hemorrhoids: Secondary | ICD-10-CM

## 2023-11-14 DIAGNOSIS — Z1211 Encounter for screening for malignant neoplasm of colon: Secondary | ICD-10-CM | POA: Diagnosis not present

## 2023-11-14 MED ORDER — SODIUM CHLORIDE 0.9 % IV SOLN: 500.0000 mL | Freq: Once | INTRAVENOUS | Status: AC

## 2023-11-14 NOTE — Progress Notes (Signed)
 GASTROENTEROLOGY PROCEDURE H&P NOTE   Primary Care Physician: Feliciano Devoria LABOR, MD    Reason for Procedure:  Colon Cancer screening  Plan:    Colonoscopy  Patient is appropriate for endoscopic procedure(s) in the ambulatory ([facility]) setting.  The nature of the procedure, as well as the risks, benefits, and alternatives were carefully and thoroughly reviewed with the patient. Ample time for discussion and questions allowed. The patient understood, was satisfied, and agreed to proceed.     HPI: Tammie Miles is a 48 y.o. female who presents for colonoscopy for routine Colon Cancer screening.  No active GI symptoms.  No known family history of colon cancer or related malignancy.  Patient is otherwise without complaints or active issues today.  Past Medical History:  Diagnosis Date   Acute posthemorrhagic anemia    hx of    ADHD    Anxiety    Aphasia    Aphasia following unspecified cerebrovascular disease    Arthritis    Depression    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HCC)    Hemiplegia affecting right nondominant side (HCC)    Hypertension    Internal derangement of left knee    Seizures (HCC)    Stroke (HCC)    cerebral infarction due to occlusion or stenosis of left middle cerebral artery     Past Surgical History:  Procedure Laterality Date   CRANIOPLASTY Left 01/22/2019   Procedure: CRANIOPLASTY;  Surgeon: Lanis Pupa, MD;  Location: [facility] OR;  Service: Neurosurgery;  Laterality: Left;  CRANIOPLASTY   CRANIOTOMY Left 11/27/2017   Procedure: CRANIOTOMY for Evacuation of Intracranial Hemorrhage;  Surgeon: Lanis Pupa, MD;  Location: [facility] OR;  Service: Neurosurgery;  Laterality: Left;  CRANIOTOMY for Evacuation of Intracranial Hemorrhage   CRANIOTOMY Left 11/28/2017   Procedure: Decompressive craniectomy;  Surgeon: Unice Pac, MD;  Location: [facility] OR;  Service: Neurosurgery;  Laterality: Left;   ESOPHAGOGASTRODUODENOSCOPY N/A 12/09/2017   Procedure:  ESOPHAGOGASTRODUODENOSCOPY (EGD);  Surgeon: Sebastian Moles, MD;  Location: [facility] ENDOSCOPY;  Service: General;  Laterality: N/A;   KNEE ARTHROSCOPY WITH MENISCAL REPAIR Right 08/13/2019   Procedure: KNEE ARTHROSCOPY WITH PARTIAL MENISECTOMY, CHONDRAPLASTY MEDIAL LATERAL, PATELLA-FEMORAL;  Surgeon: Yvone Rush, MD;  Location: [facility] ORS;  Service: Orthopedics;  Laterality: Right;   KNEE SURGERY Bilateral    PEG PLACEMENT N/A 12/09/2017   Procedure: PERCUTANEOUS ENDOSCOPIC GASTROSTOMY (PEG) PLACEMENT;  Surgeon: Sebastian Moles, MD;  Location: [facility] ENDOSCOPY;  Service: General;  Laterality: N/A;    Prior to Admission medications   Medication Sig Start Date End Date Taking? Authorizing Provider  acetaminophen  (TYLENOL ) 500 MG tablet Take 1,000 mg by mouth 3 (three) times daily.   Yes [provider]  AMBIEN 5 MG tablet Take 5 mg by mouth at bedtime. 10/22/23  Yes [provider]  aspirin  81 MG chewable tablet 81 mg. 02/04/19  Yes [provider]  atenolol -chlorthalidone (TENORETIC) 100-25 MG tablet Take 1 tablet by mouth daily. 04/03/21  Yes [provider]  atorvastatin (LIPITOR) 40 MG tablet Take 40 mg by mouth at bedtime.   Yes [provider]  Azelastine HCl 137 MCG/SPRAY SOLN 274 mcg. 12/06/19  Yes [provider]  baclofen (LIORESAL) 10 MG tablet Take 10 mg by mouth in the morning and at bedtime.   Yes [provider]  bisacodyl  5 MG EC tablet Take 1 tablet (5 mg total) by mouth as directed. 10/27/23  Yes Javaun Dimperio V, DO  gabapentin  (NEURONTIN ) 400 MG capsule Take 400 mg by mouth 3 (  three) times daily. 09/02/23  Yes [provider]  levETIRAcetam  (KEPPRA ) 1000 MG tablet Take 1,000 mg by mouth every 12 (twelve) hours.   Yes [provider]  lisinopril  (ZESTRIL ) 20 MG tablet Take 1.5 tablets by mouth daily. 09/02/23  Yes [provider]  metFORMIN (GLUCOPHAGE-XR) 500 MG 24 hr tablet Take 500 mg by mouth daily.  09/01/20  Yes [provider]  naproxen sodium (ALEVE) 220 MG tablet Take 220 mg by mouth 2 (two) times daily as needed (pain).   Yes [provider]  polyethylene glycol (MIRALAX  / GLYCOLAX ) 17 g packet Take 17 g by mouth daily as needed for moderate constipation. Mix in 4-8 oz liquid and drink   Yes [provider]  potassium chloride  (KLOR-CON ) 10 MEQ tablet Take 10 mEq by mouth 2 (two) times daily. 06/30/21  Yes [provider]  potassium chloride  SA (KLOR-CON ) 20 MEQ tablet Take 20 mEq by mouth every Monday, Wednesday, and Friday.   Yes [provider]  senna (SENOKOT) 8.6 MG tablet Take 17.2 mg by mouth. 12/06/19  Yes [provider]  venlafaxine XR (EFFEXOR-XR) 75 MG 24 hr capsule Take 75 mg by mouth daily. 09/02/23  Yes [provider]  acetaminophen -codeine (TYLENOL  #3) 300-30 MG tablet Take 1 tablet by mouth 2 (two) times daily as needed for moderate pain.    [provider]  amLODipine  (NORVASC ) 10 MG tablet Take 10 mg by mouth daily. 12/06/19   [provider]  cetirizine (ZYRTEC) 5 MG tablet Take 5 mg by mouth daily. 12/06/19   [provider]  ciclopirox (PENLAC) 8 % solution Apply topically. 12/06/19   [provider]  diclofenac Sodium (VOLTAREN) 1 % GEL Apply topically as needed.    [provider]  ergocalciferol (VITAMIN D2) 1.25 MG (50000 UT) capsule Take 50,000 Units by mouth once a week.    [provider]  HYDROcodone -acetaminophen  (NORCO/VICODIN) 5-325 MG tablet Take 1 tablet by mouth every 6 (six) hours as needed for severe pain. Patient not taking: Reported on 10/27/2023 11/05/21   Geiple, Joshua, PA-C  hydrOXYzine (VISTARIL) 50 MG capsule Take 50 mg by mouth daily as needed. Patient not taking: Reported on 10/27/2023 08/31/21   [provider]  lactulose (CHRONULAC) 10 GM/15ML solution Take by mouth. Patient not taking: Reported on 10/27/2023 09/25/21    [provider]  lidocaine  (LMX) 4 % cream Apply 1 application  topically See admin instructions. Apply to old trach site once daily, may use an additional 2 times daily as needed for pain at old trach site    [provider]  tiZANidine (ZANAFLEX) 2 MG tablet Take 2 mg by mouth 2 (two) times daily. 09/02/23   [provider]    Current Outpatient Medications  Medication Sig Dispense Refill   acetaminophen  (TYLENOL ) 500 MG tablet Take 1,000 mg by mouth 3 (three) times daily.     AMBIEN 5 MG tablet Take 5 mg by mouth at bedtime.     aspirin  81 MG chewable tablet 81 mg.     atenolol -chlorthalidone (TENORETIC) 100-25 MG tablet Take 1 tablet by mouth daily.     atorvastatin (LIPITOR) 40 MG tablet Take 40 mg by mouth at bedtime.     Azelastine HCl 137 MCG/SPRAY SOLN 274 mcg.     baclofen (LIORESAL) 10 MG tablet Take 10 mg by mouth in the morning and at bedtime.     bisacodyl  5 MG EC tablet Take 1 tablet (5 mg  total) by mouth as directed. 4 tablet 0   gabapentin  (NEURONTIN ) 400 MG capsule Take 400 mg by mouth 3 (three) times daily.     levETIRAcetam  (KEPPRA ) 1000 MG tablet Take 1,000 mg by mouth every 12 (twelve) hours.     lisinopril  (ZESTRIL ) 20 MG tablet Take 1.5 tablets by mouth daily.     metFORMIN (GLUCOPHAGE-XR) 500 MG 24 hr tablet Take 500 mg by mouth daily.     naproxen sodium (ALEVE) 220 MG tablet Take 220 mg by mouth 2 (two) times daily as needed (pain).     polyethylene glycol (MIRALAX  / GLYCOLAX ) 17 g packet Take 17 g by mouth daily as needed for moderate constipation. Mix in 4-8 oz liquid and drink     potassium chloride  (KLOR-CON ) 10 MEQ tablet Take 10 mEq by mouth 2 (two) times daily.     potassium chloride  SA (KLOR-CON ) 20 MEQ tablet Take 20 mEq by mouth every Monday, Wednesday, and Friday.     senna (SENOKOT) 8.6 MG tablet Take 17.2 mg by mouth.     venlafaxine XR (EFFEXOR-XR) 75 MG 24 hr capsule Take 75 mg by mouth daily.     acetaminophen -codeine  (TYLENOL  #3) 300-30 MG tablet Take 1 tablet by mouth 2 (two) times daily as needed for moderate pain.     amLODipine  (NORVASC ) 10 MG tablet Take 10 mg by mouth daily.     cetirizine (ZYRTEC) 5 MG tablet Take 5 mg by mouth daily.     ciclopirox (PENLAC) 8 % solution Apply topically.     diclofenac Sodium (VOLTAREN) 1 % GEL Apply topically as needed.     ergocalciferol (VITAMIN D2) 1.25 MG (50000 UT) capsule Take 50,000 Units by mouth once a week.     HYDROcodone -acetaminophen  (NORCO/VICODIN) 5-325 MG tablet Take 1 tablet by mouth every 6 (six) hours as needed for severe pain. (Patient not taking: Reported on 10/27/2023) 8 tablet 0   hydrOXYzine (VISTARIL) 50 MG capsule Take 50 mg by mouth daily as needed. (Patient not taking: Reported on 10/27/2023)     lactulose (CHRONULAC) 10 GM/15ML solution Take by mouth. (Patient not taking: Reported on 10/27/2023)     lidocaine  (LMX) 4 % cream Apply 1 application  topically See admin instructions. Apply to old trach site once daily, may use an additional 2 times daily as needed for pain at old trach site     tiZANidine (ZANAFLEX) 2 MG tablet Take 2 mg by mouth 2 (two) times daily.     Current Facility-Administered Medications  Medication Dose Route Frequency Provider Last Rate Last Admin   0.9 %  sodium chloride  infusion  500 mL Intravenous Once Isyss Espinal V, DO       0.9 %  sodium chloride  infusion  500 mL Intravenous Once Dhillon Comunale V, DO        Allergies as of 11/14/2023   (No Known Allergies)    Family History  Problem Relation Age of Onset   Hypertension Mother    Hypertension Father    Breast cancer Maternal Aunt     Social History   Socioeconomic History   Marital status: Married    Spouse name: Not on file   Number of children: 1   Years of education: Not on file   Highest education level: Not on file  Occupational History   Not on file  Tobacco Use   Smoking status: Never   Smokeless tobacco: Never  Vaping Use    Vaping status: Never Used  Substance and Sexual Activity   Alcohol use: Not Currently   Drug use: Not Currently   Sexual activity: Not Currently  Other Topics Concern   Not on file  Social History Narrative   Not on file   Social Drivers of Health   Financial Resource Strain: Not on file  Food Insecurity: Not on file  Transportation Needs: Not on file  Physical Activity: Not on file  Stress: Not on file  Social Connections: Not on file  Intimate Partner Violence: Not on file    Physical Exam: Vital signs in last 24 hours: @BP  (!) 147/78 (BP Location: Right Arm, Patient Position: Sitting, Cuff Size: Normal)   Pulse 78   Temp 98 F (36.7 C) (Temporal)   Ht 5' 3 (1.6 m)   Wt 193 lb (87.5 kg)   LMP 10/27/2023   SpO2 95%   BMI 34.19 kg/m  GEN: NAD EYE: Sclerae anicteric ENT: MMM CV: Non-tachycardic Pulm: CTA b/l GI: Soft, NT/ND NEURO:  Alert & Oriented x 3   Sandor Flatter, DO  Gastroenterology   11/14/2023 8:36 AM

## 2023-11-14 NOTE — Patient Instructions (Signed)
   Handout on hemorrhoids given to you    YOU HAD AN ENDOSCOPIC PROCEDURE TODAY AT THE [facility]:   Refer to the procedure report that was given to you for any specific questions about what was found during the examination.  If the procedure report does not answer your questions, please call your gastroenterologist to clarify.  If you requested that your care partner not be given the details of your procedure findings, then the procedure report has been included in a sealed envelope for you to review at your convenience later.  YOU SHOULD EXPECT: Some feelings of bloating in the abdomen. Passage of more gas than usual.  Walking can help get rid of the air that was put into your GI tract during the procedure and reduce the bloating. If you had a lower endoscopy (such as a colonoscopy or flexible sigmoidoscopy) you may notice spotting of blood in your stool or on the toilet paper. If you underwent a bowel prep for your procedure, you may not have a normal bowel movement for a few days.  Please Note:  You might notice some irritation and congestion in your nose or some drainage.  This is from the oxygen used during your procedure.  There is no need for concern and it should clear up in a day or so.  SYMPTOMS TO REPORT IMMEDIATELY:  Following lower endoscopy (colonoscopy or flexible sigmoidoscopy):  Excessive amounts of blood in the stool  Significant tenderness or worsening of abdominal pains  Swelling of the abdomen that is new, acute  Fever of 100F or higher   For urgent or emergent issues, a gastroenterologist can be reached at any hour by calling [phone]. Do not use MyChart messaging for urgent concerns.    DIET:  We do recommend a small meal at first, but then you may proceed to your regular diet.  Drink plenty of fluids but you should avoid alcoholic beverages for 24 hours.  ACTIVITY:  You should plan to take it easy for the rest of today and you should NOT DRIVE or  use heavy machinery until tomorrow (because of the sedation medicines used during the test).    FOLLOW UP: Our staff will call the number listed on your records the next business day following your procedure.  We will call around 7:15- 8:00 am to check on you and address any questions or concerns that you may have regarding the information given to you following your procedure. If we do not reach you, we will leave a message.     If any biopsies were taken you will be contacted by phone or by letter within the next 1-3 weeks.  Please call us  at [phone] if you have not heard about the biopsies in 3 weeks.    SIGNATURES/CONFIDENTIALITY: You and/or your care partner have signed paperwork which will be entered into your electronic medical record.  These signatures attest to the fact that that the information above on your After Visit Summary has been reviewed and is understood.  Full responsibility of the confidentiality of this discharge information lies with you and/or your care-partner.

## 2023-11-14 NOTE — Progress Notes (Signed)
 Vitals-Chelsea  Pt's states no medical or surgical changes since previsit or office visit.

## 2023-11-14 NOTE — Progress Notes (Signed)
 Report to PACU, RN, vss, BBS= Clear.

## 2023-11-14 NOTE — Op Note (Signed)
  [facility] Patient Name: Tammie Miles Procedure Date: 11/14/2023 8:25 AM MRN: [id number] Endoscopist: Sandor Flatter , MD, [id number] Age: 48 Referring MD:  Date of Birth: 03/01/1976 Gender: Female Account #: [id number] Procedure:                Colonoscopy Indications:              Screening for colorectal malignant neoplasm, This                            is the patient's first colonoscopy Medicines:                Monitored Anesthesia Care Procedure:                Pre-Anesthesia Assessment:                           - Prior to the procedure, a History and Physical                            was performed, and patient medications and                            allergies were reviewed. The patient's tolerance of                            previous anesthesia was also reviewed. The risks                            and benefits of the procedure and the sedation                            options and risks were discussed with the patient.                            All questions were answered, and informed consent                            was obtained. Prior Anticoagulants: The patient has                            taken no anticoagulant or antiplatelet agents. ASA                            Grade Assessment: III - A patient with severe                            systemic disease. After reviewing the risks and                            benefits, the patient was deemed in satisfactory                            condition to undergo the procedure.  After obtaining informed consent, the colonoscope                            was passed under direct vision. Throughout the                            procedure, the patient's blood pressure, pulse, and                            oxygen saturations were monitored continuously. The                            CF [id number] #[id number] was introduced through the anus                            and advanced to the  the cecum, identified by                            appendiceal orifice and ileocecal valve. The                            colonoscopy was performed without difficulty. The                            patient tolerated the procedure well. Following                            copious lavage and irrigation, the quality of the                            bowel preparation was adequate. The ileocecal                            valve, appendiceal orifice, and rectum were                            photographed. Scope In: 9:00:39 AM Scope Out: 9:17:18 AM Scope Withdrawal Time: 0 hours 10 minutes 25 seconds  Total Procedure Duration: 0 hours 16 minutes 39 seconds  Findings:                 The perianal and digital rectal examinations were                            normal.                           The entire colon appeared normal.                           Non-bleeding internal hemorrhoids were found during                            retroflexion. The hemorrhoids were small and Grade  II (internal hemorrhoids that prolapse but reduce                            spontaneously). Complications:            No immediate complications. Estimated Blood Loss:     Estimated blood loss: none. Impression:               - The entire examined colon is normal.                           - Non-bleeding internal hemorrhoids.                           - No specimens collected. Recommendation:           - Patient has a contact number available for                            emergencies. The signs and symptoms of potential                            delayed complications were discussed with the                            patient. Return to normal activities tomorrow.                            Written discharge instructions were provided to the                            patient.                           - Resume previous diet.                           - Continue present medications.                            - Repeat colonoscopy in 10 years for screening                            purposes.                           - Return to GI clinic PRN. Sandor Flatter, MD 11/14/2023 9:21:55 AM

## 2023-11-17 ENCOUNTER — Telehealth: Payer: Self-pay | Admitting: Lactation Services

## 2023-11-17 NOTE — Telephone Encounter (Signed)
 Phone was answered by someone other than Burnard; checked to be sure the phone number was entered correctly and it was correct.  I was unable to speak to [facility].

## 2023-12-15 ENCOUNTER — Emergency Department (HOSPITAL_COMMUNITY)

## 2023-12-15 ENCOUNTER — Other Ambulatory Visit: Payer: Self-pay

## 2023-12-15 ENCOUNTER — Emergency Department (HOSPITAL_COMMUNITY)
Admission: EM | Admit: 2023-12-15 | Discharge: 2023-12-15 | Disposition: A | Discharge status: Skilled Nursing Facility | Attending: Emergency Medicine | Admitting: Emergency Medicine

## 2023-12-15 ENCOUNTER — Encounter (HOSPITAL_COMMUNITY): Payer: Self-pay

## 2023-12-15 DIAGNOSIS — S0990XA Unspecified injury of head, initial encounter: Secondary | ICD-10-CM | POA: Diagnosis present

## 2023-12-15 DIAGNOSIS — S82891A Other fracture of right lower leg, initial encounter for closed fracture: Secondary | ICD-10-CM

## 2023-12-15 DIAGNOSIS — M7121 Synovial cyst of popliteal space [Baker], right knee: Secondary | ICD-10-CM | POA: Insufficient documentation

## 2023-12-15 DIAGNOSIS — I69351 Hemiplegia and hemiparesis following cerebral infarction affecting right dominant side: Secondary | ICD-10-CM | POA: Insufficient documentation

## 2023-12-15 DIAGNOSIS — G9389 Other specified disorders of brain: Secondary | ICD-10-CM | POA: Diagnosis not present

## 2023-12-15 DIAGNOSIS — W01198A Fall on same level from slipping, tripping and stumbling with subsequent striking against other object, initial encounter: Secondary | ICD-10-CM | POA: Diagnosis not present

## 2023-12-15 DIAGNOSIS — M4802 Spinal stenosis, cervical region: Secondary | ICD-10-CM | POA: Diagnosis not present

## 2023-12-15 DIAGNOSIS — Z79899 Other long term (current) drug therapy: Secondary | ICD-10-CM | POA: Insufficient documentation

## 2023-12-15 DIAGNOSIS — M25461 Effusion, right knee: Secondary | ICD-10-CM | POA: Diagnosis not present

## 2023-12-15 DIAGNOSIS — M50322 Other cervical disc degeneration at C5-C6 level: Secondary | ICD-10-CM | POA: Insufficient documentation

## 2023-12-15 DIAGNOSIS — Z7982 Long term (current) use of aspirin: Secondary | ICD-10-CM | POA: Diagnosis not present

## 2023-12-15 DIAGNOSIS — Z8673 Personal history of transient ischemic attack (TIA), and cerebral infarction without residual deficits: Secondary | ICD-10-CM | POA: Insufficient documentation

## 2023-12-15 DIAGNOSIS — W19XXXA Unspecified fall, initial encounter: Secondary | ICD-10-CM

## 2023-12-15 DIAGNOSIS — S8261XA Displaced fracture of lateral malleolus of right fibula, initial encounter for closed fracture: Secondary | ICD-10-CM | POA: Insufficient documentation

## 2023-12-15 DIAGNOSIS — M25561 Pain in right knee: Secondary | ICD-10-CM | POA: Insufficient documentation

## 2023-12-15 MED ORDER — FENTANYL CITRATE PF 50 MCG/ML IJ SOSY
50.0000 ug | PREFILLED_SYRINGE | Freq: Once | INTRAMUSCULAR | Status: AC
  Administered 2023-12-15: 50 ug via INTRAVENOUS
  Filled 2023-12-15: qty 1

## 2023-12-15 MED ORDER — OXYCODONE HCL 5 MG PO TABS: 5.0000 mg | ORAL_TABLET | ORAL | 0 refills | Status: AC | PRN

## 2023-12-15 MED ORDER — KETOROLAC TROMETHAMINE 15 MG/ML IJ SOLN
15.0000 mg | Freq: Once | INTRAMUSCULAR | Status: AC
  Administered 2023-12-15: 15 mg via INTRAVENOUS
  Filled 2023-12-15: qty 1

## 2023-12-15 MED ORDER — METOPROLOL TARTRATE 25 MG PO TABS: 50.0000 mg | ORAL_TABLET | Freq: Two times a day (BID) | ORAL | Status: DC

## 2023-12-15 MED ORDER — TAMSULOSIN HCL 0.4 MG PO CAPS: 0.4000 mg | ORAL_CAPSULE | Freq: Every day | ORAL | Status: DC

## 2023-12-15 MED ORDER — ACETAMINOPHEN 500 MG PO TABS
1000.0000 mg | ORAL_TABLET | Freq: Once | ORAL | Status: AC
  Administered 2023-12-15: 1000 mg via ORAL
  Filled 2023-12-15: qty 2

## 2023-12-15 NOTE — ED Provider Notes (Signed)
 [place] EMERGENCY DEPARTMENT AT [facility] Provider Note   CSN: [id number] Arrival date & time: 12/15/23  0730     Patient presents with: Knee Pain   Tammie Miles is a 48 y.o. female.   48 year old female presents today from [facility] via [facility].  She was ambulating with a walker when she fell.  Not on any blood thinning medications.  Complains of left knee pain.  She was ambulating with a walker when she fell.  Not on any blood thinning medications.  Complains of right knee pain.  She also states she struck her head when she fell.  She is nonverbal at baseline but does answer yes, no and use other simple phrases to communicate.  Does have history of previous stroke with baseline deficits on the right side.  She states the fall was strictly mechanical due to tripping when she was transitioning to bed.  The history is provided by the patient. No language interpreter was used.       Prior to Admission medications   Medication Sig Start Date End Date Taking? Authorizing Provider  oxyCODONE  (ROXICODONE ) 5 MG immediate release tablet Take 1 tablet (5 mg total) by mouth every 4 (four) hours as needed for severe pain (pain score 7-10). 12/15/23  Yes Maclane Holloran, PA-C  acetaminophen  (TYLENOL ) 500 MG tablet Take 1,000 mg by mouth 3 (three) times daily.    [provider]  AMBIEN 5 MG tablet Take 5 mg by mouth at bedtime. 10/22/23   [provider]  amLODipine  (NORVASC ) 10 MG tablet Take 10 mg by mouth daily. 12/06/19   [provider]  aspirin  81 MG chewable tablet 81 mg. 02/04/19   [provider]  atenolol -chlorthalidone (TENORETIC) 100-25 MG tablet Take 1 tablet by mouth daily. 04/03/21   [provider]  atorvastatin (LIPITOR) 40 MG tablet Take 40 mg by mouth at bedtime.    [provider]  Azelastine HCl 137 MCG/SPRAY SOLN 274 mcg. 12/06/19   [provider]  baclofen (LIORESAL) 10 MG tablet Take 10 mg by mouth in the  morning and at bedtime.    [provider]  bisacodyl  5 MG EC tablet Take 1 tablet (5 mg total) by mouth as directed. 10/27/23   Cirigliano, Vito V, DO  cetirizine (ZYRTEC) 5 MG tablet Take 5 mg by mouth daily. 12/06/19   [provider]  ciclopirox (PENLAC) 8 % solution Apply topically. 12/06/19   [provider]  diclofenac Sodium (VOLTAREN) 1 % GEL Apply topically as needed.    [provider]  ergocalciferol (VITAMIN D2) 1.25 MG (50000 UT) capsule Take 50,000 Units by mouth once a week.    [provider]  gabapentin  (NEURONTIN ) 400 MG capsule Take 400 mg by mouth 3 (three) times daily. 09/02/23   [provider]  hydrOXYzine (VISTARIL) 50 MG capsule Take 50 mg by mouth daily as needed. Patient not taking: Reported on 10/27/2023 08/31/21   [provider]  lactulose (CHRONULAC) 10 GM/15ML solution Take by mouth. Patient not taking: Reported on 10/27/2023 09/25/21   [provider]  levETIRAcetam  (KEPPRA ) 1000 MG tablet Take 1,000 mg by mouth every 12 (twelve) hours.    [provider]  lidocaine  (LMX) 4 % cream Apply 1 application  topically See admin instructions. Apply to old trach site once daily, may use an additional 2 times daily as needed for pain at old trach site    [provider]  lisinopril  (ZESTRIL ) 20  MG tablet Take 1.5 tablets by mouth daily. 09/02/23   [provider]  metFORMIN (GLUCOPHAGE-XR) 500 MG 24 hr tablet Take 500 mg by mouth daily. 09/01/20   [provider]  naproxen sodium (ALEVE) 220 MG tablet Take 220 mg by mouth 2 (two) times daily as needed (pain).    [provider]  polyethylene glycol (MIRALAX  / GLYCOLAX ) 17 g packet Take 17 g by mouth daily as needed for moderate constipation. Mix in 4-8 oz liquid and drink    [provider]  potassium chloride  (KLOR-CON ) 10 MEQ tablet Take 10 mEq by mouth 2 (two) times daily. 06/30/21   [provider]   potassium chloride  SA (KLOR-CON ) 20 MEQ tablet Take 20 mEq by mouth every Monday, Wednesday, and Friday.    [provider]  senna (SENOKOT) 8.6 MG tablet Take 17.2 mg by mouth. 12/06/19   [provider]  tiZANidine (ZANAFLEX) 2 MG tablet Take 2 mg by mouth 2 (two) times daily. 09/02/23   [provider]  venlafaxine XR (EFFEXOR-XR) 75 MG 24 hr capsule Take 75 mg by mouth daily. 09/02/23   [provider]    Allergies: Patient has no known allergies.    Review of Systems  Constitutional:  Negative for chills and fever.  Respiratory:  Negative for shortness of breath.   Gastrointestinal:  Negative for abdominal pain and vomiting.  Musculoskeletal:  Positive for arthralgias. Negative for neck pain.  Neurological:  Negative for headaches.  All other systems reviewed and are negative.   Updated Vital Signs BP (!) 158/100   Pulse 96   Temp 98.7 F (37.1 C) (Oral)   Resp 16   Ht 5' 3 (1.6 m)   Wt 88 kg   SpO2 94%   BMI 34.37 kg/m   Physical Exam Vitals and nursing note reviewed.  Constitutional:      General: She is not in acute distress.    Appearance: Normal appearance. She is not ill-appearing.  HENT:     Head: Normocephalic and atraumatic.     Nose: Nose normal.  Eyes:     Conjunctiva/sclera: Conjunctivae normal.  Cardiovascular:     Rate and Rhythm: Normal rate and regular rhythm.  Pulmonary:     Effort: Pulmonary effort is normal. No respiratory distress.  Musculoskeletal:        General: Tenderness present. No deformity.     Cervical back: Normal range of motion.     Comments: Mild swelling to the right knee.  Right knee with moderate tenderness to palpation.  Right ankle with good range of motion and with minimal tenderness to palpation.  Neurovascularly intact in bilateral lower extremities.  Right hip without tenderness to palpation.  Skin:    Findings: No rash.  Neurological:     Mental Status: She is alert.     (all labs  ordered are listed, but only abnormal results are displayed) Labs Reviewed - No data to display  EKG: None  Radiology: CT Knee Right Wo Contrast Result Date: 12/15/2023 CLINICAL DATA:  A fell.  Knee pain. EXAM: CT OF THE RIGHT KNEE WITHOUT CONTRAST TECHNIQUE: Multidetector CT imaging of the right knee was performed according to the standard protocol. Multiplanar CT image reconstructions were also generated. RADIATION DOSE REDUCTION: This exam was performed according to the departmental dose-optimization program which includes automated exposure control, adjustment of the mA and/or kV according to patient size and/or use of iterative reconstruction technique. COMPARISON:  Radiographs, same date. FINDINGS: Age  advanced tricompartmental degenerative changes, most significant in the medial compartment where there are areas of full-thickness cartilage loss, significant joint space narrowing, bony eburnation, osteophytic spurring and subchondral cystic change. Evidence of prior right tibial fracture and prior intramedullary rod fixation which as since been removed. No acute fracture of the tibia, fibula, femur or patella. Moderate to large joint effusion present. Moderate-sized Baker's cyst. Grossly by CT the PCL and collateral ligaments are intact. The ACL is not well demonstrated. The quadriceps and patellar tendons are intact. No obvious muscle tears. IMPRESSION: 1. 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 2. No acute fracture of the tibia, fibula, femur or patella. 3. Moderate to large joint effusion and moderate-sized Baker's cyst. Electronically Signed   By: MYRTIS Stammer M.D.   On: 12/15/2023 14:24   CT Cervical Spine Wo Contrast Result Date: 12/15/2023 EXAM: CT CERVICAL SPINE WITHOUT CONTRAST 12/15/2023 09:07:36 AM TECHNIQUE: CT of the cervical spine was performed without the administration of intravenous contrast. Multiplanar reformatted images are  provided for review. Automated exposure control, iterative reconstruction, and/or weight based adjustment of the mA/kV was utilized to reduce the radiation dose to as low as reasonably achievable. COMPARISON: CT of the cervical spine dated 01/27/2022. CLINICAL HISTORY: Polytrauma, blunt. FINDINGS: CERVICAL SPINE: BONES AND ALIGNMENT: There is straightening of the normal cervical lordosis. No acute fracture or traumatic malalignment. DEGENERATIVE CHANGES: There is mild posterior degenerative spurring at C3-C4. There is moderate diffuse disc bulging and endplate ridging at C5-C6 causing moderate central spinal canal stenosis and moderate bilateral neural foraminal stenosis. There is also disc bulging and endplate ridging at C6-C7, with mild-to-moderate central spinal canal stenosis. SOFT TISSUES: No prevertebral soft tissue swelling. The paraspinous soft tissues are unremarkable. IMPRESSION: 1. No acute abnormality of the cervical spine. 2. Moderate diffuse disc bulging and endplate ridging at C5-6 causing moderate central spinal canal stenosis and moderate bilateral neural foraminal stenosis. 3. Disc bulging and endplate ridging at C6-7 with mild-to-moderate central spinal canal stenosis. Electronically signed by: Evalene Coho MD 12/15/2023 09:28 AM EDT RP Workstation: [id number]   CT Head Wo Contrast Result Date: 12/15/2023 EXAM: CT HEAD WITHOUT CONTRAST 12/15/2023 09:07:36 AM TECHNIQUE: CT of the head was performed without the administration of intravenous contrast. Automated exposure control, iterative reconstruction, and/or weight based adjustment of the mA/kV was utilized to reduce the radiation dose to as low as reasonably achievable. COMPARISON: CT of the head dated 01/27/2022. CLINICAL HISTORY: Polytrauma, blunt. Patient BIB [facility] from [facility]. Medics report patient was ambulating with her walker and fell. No blood thinners. Has right knee swelling and pain to touch. Nonverbal, right side deficits  due to prior stroke. Able to answer yes and no. FINDINGS: BRAIN AND VENTRICLES: No acute hemorrhage. No evidence of acute infarct. No hydrocephalus. No extra-axial collection. No mass effect or midline shift. Chronic encephalomalacia changes are again demonstrated throughout the left MCA distribution. The patient is status post left corona parietal craniotomy. There is no definite evidence of acute traumatic injury or significant interval change. ORBITS: No acute abnormality. SINUSES: No acute abnormality. SOFT TISSUES AND SKULL: No acute soft tissue abnormality. No skull fracture. IMPRESSION: 1. No acute intracranial abnormality. 2. Chronic left MCA distribution encephalomalacia and postoperative changes from left coronal-parietal craniotomy without significant interval change. Electronically signed by: Evalene Coho MD 12/15/2023 09:25 AM EDT RP Workstation: [id number]   DG Ankle Complete Right Result Date: 12/15/2023 CLINICAL DATA:  Fall. EXAM: RIGHT ANKLE - COMPLETE 3+ VIEW COMPARISON:  None  Available. FINDINGS: Minimally displaced fracture of the distal fibula. Ankle mortise is intact. No additional evidence of a fracture. IMPRESSION: Minimally displaced lateral malleolar fracture. Electronically Signed   By: Newell Eke M.D.   On: 12/15/2023 08:59   DG Knee Right Port Result Date: 12/15/2023 EXAM: 1 or 2 VIEW(S) XRAY OF THE RIGHT KNEE 12/15/2023 08:17:00 AM COMPARISON: None available. CLINICAL HISTORY: knee pain. Patient BIB [facility] from [facility]. Medics report patient was ambulating with her walker and fell. No blood thinners. Has right knee swelling and pain to touch. FINDINGS: BONES AND JOINTS: No acute fracture. Remote healed proximal tibial and fibular shaft fractures. No focal osseous lesion. No joint dislocation. Small suprapatellar effusion. Moderate tricompartmental degenerative changes. SOFT TISSUES: The soft tissues are unremarkable. IMPRESSION: 1. No acute fracture or dislocation. 2.  Small suprapatellar joint effusion. Electronically signed by: Waddell Calk MD 12/15/2023 08:39 AM EDT RP Workstation: [id number]     Procedures   Medications Ordered in the ED  fentaNYL  (SUBLIMAZE ) injection 50 mcg (50 mcg Intravenous Given 12/15/23 0837)  fentaNYL  (SUBLIMAZE ) injection 50 mcg (50 mcg Intravenous Given 12/15/23 1002)  ketorolac  (TORADOL ) 15 MG/ML injection 15 mg (15 mg Intravenous Given 12/15/23 1252)  acetaminophen  (TYLENOL ) tablet 1,000 mg (1,000 mg Oral Given 12/15/23 1252)                                    Medical Decision Making Amount and/or Complexity of Data Reviewed Radiology: ordered.  Risk OTC drugs. Prescription drug management.   48 year old female presents today after mechanical fall.  No loss consciousness.  She did strike her head.  Not on any blood thinning medicines.  Her main concern is right knee pain.  Images obtained of the CT head, C-spine.  C-spine shows some moderate stenosis from disc bulging at multiple levels but no neurodeficits or spinal process tenderness on exam.  CT head without acute concern.  Right knee without acute concern other than some joint swelling.  Right ankle shows lateral malleoli fracture.  She had this on previous x-ray when she came in for evaluation after a fall.  She was provided with a cam boot at that time.  She states she did follow-up with orthopedist at that time.  He does not mention that this fracture is chronic.  Will provide her with a cam boot. Given her ongoing pain CT of the right knee obtained.  No acute findings on this.  Supportive care discussed.  Pain control discussed.  Patient discharged in stable condition.  She is in agreement with this plan.  Discussed with attending who agrees with plan as well.  Patient discharged in stable condition.    Final diagnoses:  Fall, initial encounter  Closed fracture of right ankle, initial encounter  Acute pain of right knee  Cervical stenosis of spine     ED Discharge Orders          Ordered    oxyCODONE  (ROXICODONE ) 5 MG immediate release tablet  Every 4 hours PRN        12/15/23 1443               Hildegard Loge, PA-C 12/15/23 1508    Suzette Pac, MD 12/15/23 1624

## 2023-12-15 NOTE — ED Notes (Signed)
 X-ray at bedside.

## 2023-12-15 NOTE — Progress Notes (Signed)
 Orthopedic Tech Progress Note Patient Details:  Tammie Miles 1976-01-08 [id number] Applied knee immobilizer and CAM boot per order.  Ortho Devices Type of Ortho Device: Knee Immobilizer, CAM walker Ortho Device/Splint Location: RLE Ortho Device/Splint Interventions: Ordered, Application, Adjustment   Post Interventions Patient Tolerated: Well Instructions Provided: Adjustment of device, Care of device, Poper ambulation with device  Morna Pink 12/15/2023, 4:00 PM

## 2023-12-15 NOTE — ED Notes (Signed)
 Left via [facility]

## 2023-12-15 NOTE — ED Notes (Signed)
 Pt was incontinent of urine. Bed linens changed, clean brief applied, paper scrub pants applied to pt.

## 2023-12-15 NOTE — Discharge Instructions (Addendum)
 There is a fracture on the right ankle x-ray.  This was seen previously when you had your last fall and were seen in the emergency department.  You are given a walking boot.  No fractures of the right knee.  There is some stenosis of the neck.  I have given you follow-up with the spine clinic.  Not an emergent today.  I have sent in a short course of pain medicine.  Be careful when you take this medicine as it will make you drowsy and increased risk of falling.  Primarily take Tylenol  and ibuprofen .  Ice your knee and ankle as this will help with pain and inflammation.  Return for any emergent symptoms.

## 2023-12-15 NOTE — ED Triage Notes (Addendum)
 Patient BIB [facility] from [facility]. Medics report patient was ambulating with her walker and fell. No blood thinners. Has right knee swelling and pain to touch. Nonverbal, right side deficits due to prior stroke. Able to answer yes and no.

## 2023-12-15 NOTE — ED Notes (Signed)
 Attempted to place patient on the bed alarm. Alarm boxes are not available at this time.
